# Patient Record
Sex: Male | Born: 1945
Health system: Southern US, Community
[De-identification: ages and names within clinical notes are randomized; demographics above are authoritative.]

## PROBLEM LIST (undated history)

## (undated) DIAGNOSIS — J302 Other seasonal allergic rhinitis: Secondary | ICD-10-CM

## (undated) DIAGNOSIS — D126 Benign neoplasm of colon, unspecified: Secondary | ICD-10-CM

## (undated) DIAGNOSIS — Z973 Presence of spectacles and contact lenses: Secondary | ICD-10-CM

## (undated) DIAGNOSIS — K648 Other hemorrhoids: Secondary | ICD-10-CM

## (undated) DIAGNOSIS — Z9889 Other specified postprocedural states: Secondary | ICD-10-CM

## (undated) DIAGNOSIS — Z860101 Personal history of adenomatous and serrated colon polyps: Secondary | ICD-10-CM

## (undated) DIAGNOSIS — Z8719 Personal history of other diseases of the digestive system: Secondary | ICD-10-CM

## (undated) DIAGNOSIS — IMO0002 Reserved for concepts with insufficient information to code with codable children: Secondary | ICD-10-CM

## (undated) DIAGNOSIS — M1990 Unspecified osteoarthritis, unspecified site: Secondary | ICD-10-CM

## (undated) DIAGNOSIS — E785 Hyperlipidemia, unspecified: Secondary | ICD-10-CM

## (undated) DIAGNOSIS — C61 Malignant neoplasm of prostate: Secondary | ICD-10-CM

## (undated) DIAGNOSIS — T7840XA Allergy, unspecified, initial encounter: Secondary | ICD-10-CM

## (undated) DIAGNOSIS — R112 Nausea with vomiting, unspecified: Secondary | ICD-10-CM

## (undated) DIAGNOSIS — K5790 Diverticulosis of intestine, part unspecified, without perforation or abscess without bleeding: Secondary | ICD-10-CM

## (undated) DIAGNOSIS — Z800 Family history of malignant neoplasm of digestive organs: Secondary | ICD-10-CM

## (undated) DIAGNOSIS — Z86010 Personal history of colonic polyps: Secondary | ICD-10-CM

## (undated) DIAGNOSIS — F419 Anxiety disorder, unspecified: Secondary | ICD-10-CM

## (undated) DIAGNOSIS — K589 Irritable bowel syndrome without diarrhea: Secondary | ICD-10-CM

## (undated) DIAGNOSIS — H269 Unspecified cataract: Secondary | ICD-10-CM

## (undated) DIAGNOSIS — K902 Blind loop syndrome, not elsewhere classified: Secondary | ICD-10-CM

## (undated) HISTORY — PX: CATARACT EXTRACTION: SUR2

## (undated) HISTORY — DX: Diverticulosis of intestine, part unspecified, without perforation or abscess without bleeding: K57.90

## (undated) HISTORY — DX: Family history of malignant neoplasm of digestive organs: Z80.0

## (undated) HISTORY — DX: Other hemorrhoids: K64.8

## (undated) HISTORY — PX: COLONOSCOPY: SHX174

## (undated) HISTORY — DX: Unspecified osteoarthritis, unspecified site: M19.90

## (undated) HISTORY — PX: ROTATOR CUFF REPAIR: SHX139

## (undated) HISTORY — DX: Allergy, unspecified, initial encounter: T78.40XA

## (undated) HISTORY — PX: ESOPHAGOGASTRODUODENOSCOPY (EGD) WITH PROPOFOL: SHX5813

## (undated) HISTORY — DX: Hyperlipidemia, unspecified: E78.5

## (undated) HISTORY — PX: MANDIBLE SURGERY: SHX707

## (undated) HISTORY — PX: TONSILLECTOMY: SUR1361

## (undated) HISTORY — DX: Benign neoplasm of colon, unspecified: D12.6

## (undated) HISTORY — PX: OTHER SURGICAL HISTORY: SHX169

## (undated) HISTORY — PX: INGUINAL HERNIA REPAIR: SUR1180

## (undated) HISTORY — PX: VASECTOMY: SHX75

## (undated) HISTORY — PX: UPPER GASTROINTESTINAL ENDOSCOPY: SHX188

## (undated) HISTORY — DX: Irritable bowel syndrome, unspecified: K58.9

## (undated) HISTORY — DX: Unspecified cataract: H26.9

## (undated) HISTORY — DX: Reserved for concepts with insufficient information to code with codable children: IMO0002

## (undated) HISTORY — DX: Blind loop syndrome, not elsewhere classified: K90.2

---

## 2004-05-02 ENCOUNTER — Ambulatory Visit (HOSPITAL_COMMUNITY): Admission: RE | Admit: 2004-05-02 | Discharge: 2004-05-02 | Payer: Self-pay | Admitting: Gastroenterology

## 2004-05-02 ENCOUNTER — Encounter: Payer: Self-pay | Admitting: Physician Assistant

## 2006-04-28 ENCOUNTER — Encounter: Payer: Self-pay | Admitting: Cardiovascular Disease

## 2008-05-31 ENCOUNTER — Ambulatory Visit: Payer: Self-pay | Admitting: Gastroenterology

## 2008-05-31 DIAGNOSIS — M549 Dorsalgia, unspecified: Secondary | ICD-10-CM | POA: Insufficient documentation

## 2008-05-31 DIAGNOSIS — Z87898 Personal history of other specified conditions: Secondary | ICD-10-CM

## 2008-05-31 DIAGNOSIS — K5900 Constipation, unspecified: Secondary | ICD-10-CM | POA: Insufficient documentation

## 2008-05-31 LAB — CONVERTED CEMR LAB
Amylase: 79 units/L (ref 27–131)
Ferritin: 59.7 ng/mL (ref 22.0–322.0)
Folate: 18.8 ng/mL
Iron: 58 ug/dL (ref 42–165)
Lipase: 26 units/L (ref 11.0–59.0)
Saturation Ratios: 17.5 % — ABNORMAL LOW (ref 20.0–50.0)
Sed Rate: 11 mm/hr (ref 0–16)
Tissue Transglutaminase Ab, IgA: 0 units (ref <7)
Transferrin: 236.1 mg/dL (ref >=212.0)
Vitamin B-12: 513 pg/mL (ref 211–911)

## 2008-06-08 ENCOUNTER — Telehealth: Payer: Self-pay | Admitting: Gastroenterology

## 2008-06-12 ENCOUNTER — Ambulatory Visit: Payer: Self-pay | Admitting: Gastroenterology

## 2008-06-12 DIAGNOSIS — K3189 Other diseases of stomach and duodenum: Secondary | ICD-10-CM

## 2008-06-12 DIAGNOSIS — R1013 Epigastric pain: Secondary | ICD-10-CM

## 2008-06-13 ENCOUNTER — Encounter: Payer: Self-pay | Admitting: Gastroenterology

## 2008-06-13 ENCOUNTER — Ambulatory Visit: Payer: Self-pay | Admitting: Gastroenterology

## 2008-06-15 ENCOUNTER — Encounter: Payer: Self-pay | Admitting: Gastroenterology

## 2008-06-19 ENCOUNTER — Telehealth: Payer: Self-pay | Admitting: Gastroenterology

## 2008-06-26 ENCOUNTER — Ambulatory Visit (HOSPITAL_COMMUNITY): Admission: RE | Admit: 2008-06-26 | Discharge: 2008-06-26 | Payer: Self-pay | Admitting: Gastroenterology

## 2008-07-11 DIAGNOSIS — K2990 Gastroduodenitis, unspecified, without bleeding: Secondary | ICD-10-CM

## 2008-07-11 DIAGNOSIS — N281 Cyst of kidney, acquired: Secondary | ICD-10-CM | POA: Insufficient documentation

## 2008-07-11 DIAGNOSIS — K2970 Gastritis, unspecified, without bleeding: Secondary | ICD-10-CM | POA: Insufficient documentation

## 2008-07-11 DIAGNOSIS — K2980 Duodenitis without bleeding: Secondary | ICD-10-CM | POA: Insufficient documentation

## 2008-07-17 ENCOUNTER — Ambulatory Visit: Payer: Self-pay | Admitting: Gastroenterology

## 2008-07-17 DIAGNOSIS — K4090 Unilateral inguinal hernia, without obstruction or gangrene, not specified as recurrent: Secondary | ICD-10-CM | POA: Insufficient documentation

## 2008-07-17 DIAGNOSIS — K589 Irritable bowel syndrome without diarrhea: Secondary | ICD-10-CM

## 2008-07-17 DIAGNOSIS — D509 Iron deficiency anemia, unspecified: Secondary | ICD-10-CM

## 2008-07-19 ENCOUNTER — Telehealth: Payer: Self-pay | Admitting: Gastroenterology

## 2008-07-27 ENCOUNTER — Ambulatory Visit: Payer: Self-pay | Admitting: Gastroenterology

## 2008-07-27 LAB — CONVERTED CEMR LAB: Fecal Occult Bld: NEGATIVE

## 2008-08-09 ENCOUNTER — Encounter: Payer: Self-pay | Admitting: Gastroenterology

## 2008-09-28 ENCOUNTER — Ambulatory Visit (HOSPITAL_BASED_OUTPATIENT_CLINIC_OR_DEPARTMENT_OTHER): Admission: RE | Admit: 2008-09-28 | Discharge: 2008-09-28 | Payer: Self-pay | Admitting: Surgery

## 2009-03-26 ENCOUNTER — Ambulatory Visit (HOSPITAL_COMMUNITY): Admission: RE | Admit: 2009-03-26 | Discharge: 2009-03-27 | Payer: Self-pay | Admitting: Specialist

## 2009-06-10 ENCOUNTER — Encounter: Payer: Self-pay | Admitting: Physician Assistant

## 2009-07-08 ENCOUNTER — Encounter: Payer: Self-pay | Admitting: Physician Assistant

## 2009-08-20 ENCOUNTER — Encounter: Payer: Self-pay | Admitting: Physician Assistant

## 2009-09-08 ENCOUNTER — Encounter: Payer: Self-pay | Admitting: Physician Assistant

## 2009-09-10 ENCOUNTER — Encounter: Payer: Self-pay | Admitting: Physician Assistant

## 2009-09-23 ENCOUNTER — Encounter: Payer: Self-pay | Admitting: Physician Assistant

## 2010-02-10 ENCOUNTER — Ambulatory Visit: Payer: Self-pay | Admitting: Gastroenterology

## 2010-02-10 ENCOUNTER — Ambulatory Visit: Payer: Self-pay | Admitting: Cardiovascular Disease

## 2010-02-10 ENCOUNTER — Telehealth: Payer: Self-pay | Admitting: Gastroenterology

## 2010-02-10 DIAGNOSIS — R110 Nausea: Secondary | ICD-10-CM | POA: Insufficient documentation

## 2010-02-10 DIAGNOSIS — K5730 Diverticulosis of large intestine without perforation or abscess without bleeding: Secondary | ICD-10-CM | POA: Insufficient documentation

## 2010-02-10 DIAGNOSIS — R1013 Epigastric pain: Secondary | ICD-10-CM

## 2010-02-10 DIAGNOSIS — R1011 Right upper quadrant pain: Secondary | ICD-10-CM

## 2010-02-10 DIAGNOSIS — K219 Gastro-esophageal reflux disease without esophagitis: Secondary | ICD-10-CM | POA: Insufficient documentation

## 2010-02-10 LAB — CONVERTED CEMR LAB
ALT: 50 units/L (ref 0–53)
AST: 35 units/L (ref 0–37)
Albumin: 4.2 g/dL (ref 3.5–5.2)
Alkaline Phosphatase: 68 units/L (ref 39–117)
BUN: 7 mg/dL (ref 6–23)
Basophils Absolute: 0 10*3/uL (ref 0.0–0.1)
Basophils Relative: 0.5 % (ref 0.0–3.0)
Bilirubin Urine: NEGATIVE
CO2: 31 meq/L (ref 19–32)
CRP, High Sensitivity: 72.13 — ABNORMAL HIGH (ref 0.00–5.00)
Calcium: 9 mg/dL (ref 8.4–10.5)
Chloride: 99 meq/L (ref 96–112)
Creatinine, Ser: 0.9 mg/dL (ref 0.4–1.5)
Eosinophils Absolute: 0 10*3/uL (ref 0.0–0.7)
Eosinophils Relative: 0.4 % (ref 0.0–5.0)
GFR calc non Af Amer: 91.33 mL/min (ref >60)
Glucose, Bld: 105 mg/dL — ABNORMAL HIGH (ref 70–99)
HCT: 43.1 % (ref 39.0–52.0)
Hemoglobin: 15 g/dL (ref 13.0–17.0)
Ketones, ur: NEGATIVE mg/dL
Leukocytes, UA: NEGATIVE
Lipase: 34 units/L (ref 11.0–59.0)
Lymphocytes Relative: 10.3 % — ABNORMAL LOW (ref 12.0–46.0)
Lymphs Abs: 0.6 10*3/uL — ABNORMAL LOW (ref 0.7–4.0)
MCHC: 34.7 g/dL (ref 30.0–36.0)
MCV: 91.2 fL (ref 78.0–100.0)
Monocytes Absolute: 0.5 10*3/uL (ref 0.1–1.0)
Monocytes Relative: 9.1 % (ref 3.0–12.0)
Neutro Abs: 4.4 10*3/uL (ref 1.4–7.7)
Neutrophils Relative %: 79.7 % — ABNORMAL HIGH (ref 43.0–77.0)
Nitrite: NEGATIVE
Platelets: 180 10*3/uL (ref 150.0–400.0)
Potassium: 5.6 meq/L — ABNORMAL HIGH (ref 3.5–5.1)
RBC: 4.72 M/uL (ref 4.22–5.81)
RDW: 13.5 % (ref 11.5–14.6)
Sodium: 135 meq/L (ref 135–145)
Specific Gravity, Urine: 1.01 (ref 1.000–1.030)
Total Bilirubin: 0.8 mg/dL (ref 0.3–1.2)
Total Protein, Urine: NEGATIVE mg/dL
Total Protein: 6.8 g/dL (ref 6.0–8.3)
Urine Glucose: NEGATIVE mg/dL
Urobilinogen, UA: 0.2 (ref 0.0–1.0)
WBC: 5.6 10*3/uL (ref 4.5–10.5)
pH: 6.5 (ref 5.0–8.0)

## 2010-02-11 ENCOUNTER — Ambulatory Visit (HOSPITAL_COMMUNITY): Admission: RE | Admit: 2010-02-11 | Discharge: 2010-02-11 | Payer: Self-pay | Admitting: Gastroenterology

## 2010-02-11 DIAGNOSIS — K8020 Calculus of gallbladder without cholecystitis without obstruction: Secondary | ICD-10-CM | POA: Insufficient documentation

## 2010-02-12 ENCOUNTER — Telehealth: Payer: Self-pay | Admitting: Physician Assistant

## 2010-02-13 ENCOUNTER — Ambulatory Visit: Payer: Self-pay | Admitting: Physician Assistant

## 2010-02-13 ENCOUNTER — Ambulatory Visit: Payer: Self-pay | Admitting: Internal Medicine

## 2010-02-13 DIAGNOSIS — R143 Flatulence: Secondary | ICD-10-CM

## 2010-02-13 DIAGNOSIS — R141 Gas pain: Secondary | ICD-10-CM

## 2010-02-13 DIAGNOSIS — R142 Eructation: Secondary | ICD-10-CM

## 2010-02-14 ENCOUNTER — Telehealth: Payer: Self-pay | Admitting: Physician Assistant

## 2010-02-14 LAB — CONVERTED CEMR LAB
ALT: 34 units/L (ref 0–53)
AST: 31 units/L (ref 0–37)
Albumin: 4.3 g/dL (ref 3.5–5.2)
Alkaline Phosphatase: 60 units/L (ref 39–117)
BUN: 11 mg/dL (ref 6–23)
Basophils Absolute: 0 10*3/uL (ref 0.0–0.1)
Basophils Relative: 0.5 % (ref 0.0–3.0)
CO2: 29 meq/L (ref 19–32)
CRP, High Sensitivity: 32.93 — ABNORMAL HIGH (ref 0.00–5.00)
Calcium: 9.4 mg/dL (ref 8.4–10.5)
Chloride: 100 meq/L (ref 96–112)
Creatinine, Ser: 1 mg/dL (ref 0.4–1.5)
Eosinophils Absolute: 0.2 10*3/uL (ref 0.0–0.7)
Eosinophils Relative: 4.3 % (ref 0.0–5.0)
GFR calc non Af Amer: 78.92 mL/min (ref >60)
Glucose, Bld: 101 mg/dL — ABNORMAL HIGH (ref 70–99)
HCT: 41.5 % (ref 39.0–52.0)
Hemoglobin: 14.4 g/dL (ref 13.0–17.0)
Lymphocytes Relative: 24.8 % (ref 12.0–46.0)
Lymphs Abs: 1 10*3/uL (ref 0.7–4.0)
MCHC: 34.6 g/dL (ref 30.0–36.0)
MCV: 91.3 fL (ref 78.0–100.0)
Monocytes Absolute: 0.5 10*3/uL (ref 0.1–1.0)
Monocytes Relative: 12.1 % — ABNORMAL HIGH (ref 3.0–12.0)
Neutro Abs: 2.5 10*3/uL (ref 1.4–7.7)
Neutrophils Relative %: 58.3 % (ref 43.0–77.0)
Platelets: 199 10*3/uL (ref 150.0–400.0)
Potassium: 4.7 meq/L (ref 3.5–5.1)
RBC: 4.55 M/uL (ref 4.22–5.81)
RDW: 13.3 % (ref 11.5–14.6)
Sodium: 138 meq/L (ref 135–145)
Total Bilirubin: 0.6 mg/dL (ref 0.3–1.2)
Total Protein: 7.2 g/dL (ref 6.0–8.3)
WBC: 4.2 10*3/uL — ABNORMAL LOW (ref 4.5–10.5)

## 2010-02-20 ENCOUNTER — Ambulatory Visit (HOSPITAL_COMMUNITY): Admission: RE | Admit: 2010-02-20 | Discharge: 2010-02-20 | Payer: Self-pay | Admitting: Internal Medicine

## 2010-02-24 ENCOUNTER — Telehealth: Payer: Self-pay | Admitting: Physician Assistant

## 2010-02-28 ENCOUNTER — Encounter: Payer: Self-pay | Admitting: Physician Assistant

## 2010-04-17 ENCOUNTER — Encounter: Payer: Self-pay | Admitting: Gastroenterology

## 2010-05-18 HISTORY — PX: LAPAROSCOPIC CHOLECYSTECTOMY: SUR755

## 2010-05-28 ENCOUNTER — Telehealth: Payer: Self-pay | Admitting: Gastroenterology

## 2010-06-04 ENCOUNTER — Encounter (INDEPENDENT_AMBULATORY_CARE_PROVIDER_SITE_OTHER): Payer: Self-pay | Admitting: *Deleted

## 2010-06-09 ENCOUNTER — Ambulatory Visit
Admission: RE | Admit: 2010-06-09 | Discharge: 2010-06-09 | Payer: Self-pay | Source: Home / Self Care | Attending: Gastroenterology | Admitting: Gastroenterology

## 2010-06-16 ENCOUNTER — Ambulatory Visit
Admission: RE | Admit: 2010-06-16 | Discharge: 2010-06-16 | Payer: Self-pay | Source: Home / Self Care | Attending: Gastroenterology | Admitting: Gastroenterology

## 2010-06-16 ENCOUNTER — Other Ambulatory Visit: Payer: Self-pay | Admitting: Gastroenterology

## 2010-06-16 DIAGNOSIS — D1330 Benign neoplasm of unspecified part of small intestine: Secondary | ICD-10-CM

## 2010-06-17 NOTE — Assessment & Plan Note (Signed)
Summary: RUQ pain, nasuea and fever/sheri   History of Present Illness Visit Type: Follow-up Visit Primary GI MD: Sheryn Bison MD FACP FAGA Primary Provider: Darrow Bussing, MD Chief Complaint: Patient complains of adbominal pain that has happened twice in the past week. He went to [place] [facility] and they gave him a liquid to drank and he felt better. The second time he ran a fever and was tender to the touch on the right side. Today he is still having some tenderness on the right side and some bloating.  History of Present Illness:   PLEASANT 65 Y.O MALE KNOWN TO DR. PATTERSON WITH HX OF GERD,BARRETTS,DIVERTICULOSIS. LAST COLONOSCOPY WAS DONE IN 2005 PER DR. Lollie Marrow WILL OBTAIN A COPY. HE COMES IN TODAY AS AN ACUTE ADD ON WITH C/O UPPER ABDOMINAL PAIN, BLOATING,GAS,NAUSEA AND FEVER ONSET 9/24 AT ABOUT MIDNIGHT. HE SAYS HE WOKE UP WITH DISCOMFORT ALL ACROSS THE UPPER ABDOMEN,RADIATING INTO HIS BACK AND DESCRIBED AS A PUSHING FEELING ON HIS RIBS. HE DEVELOPED CHILLS,THEN FEVER TO 103 DOCUMENTED BY HIS WIFE. HE CONTINUED TO HAVE FEVER YESTERDAY IN THE 100 RANGE,AND 100.4 AT HOME THIS AM. NO DIARREA,APPETITE DECREASED. APPARENTLY WAS VISIBLY BLOATED YESTERDAY, VERY GASSY. NO DYSURIA, NO COUCH ,SPUTUM ETC. EATING VERY LITTLE AS AFRAID IT WILL MAKE HIM FEEL WORSE.  HE HAD A SIMILAR EPISODE  2 WEEKS AGO. HE WAS SEEN AT [facility],DID NOT TAKE HIS TEMP THAT TIME. NO LABS OR XRAYS-WAS TREATED WITH WHAT SOUNDS LIKE A GI COCKTAIL FOR 5-6 DAYS AND SXS RESOLVED.  HE HAD AN [place]  2/10 WHICH WAS NORMAL.ONLY PRIOR SURGERY WAS A LEFT INGUINAL HERNIA REPAIR EARLIER THIS YEAR.   GI Review of Systems    Reports bloating.     Location of  Abdominal pain: upper abdomen.    Denies abdominal pain, acid reflux, belching, chest pain, dysphagia with liquids, dysphagia with solids, heartburn, loss of appetite, nausea, vomiting, vomiting blood, weight loss, and  weight gain.        Denies anal fissure, black  tarry stools, change in bowel habit, constipation, diarrhea, diverticulosis, fecal incontinence, heme positive stool, hemorrhoids, irritable bowel syndrome, jaundice, light color stool, liver problems, rectal bleeding, and  rectal pain.    Current Medications (verified): 1)  Multivitamins  Tabs (Multiple Vitamin) .Marland Kitchen.. 1 Tablet By Mouth Once Daily 2)  Aciphex 20 Mg  Tbec (Rabeprazole Sodium) .... Take 1 Each Day 30 Minutes Before Meals 3)  Fish Oil 1000 Mg Caps (Omega-3 Fatty Acids) .... Once Daily 4)  Flomax 0.4 Mg Xr24h-Cap (Tamsulosin Hcl) .... At Bedtime 5)  Glucosamine 1500 Complex  Caps (Glucosamine-Chondroit-Vit C-Mn) .... Once Daily 6)  Align   Caps (Misc Intestinal Flora Regulat) .... Take One Capsule By Mouth Daily As Needed 7)  Tylenol 325 Mg Tabs (Acetaminophen) .... Take One By Mouth As Needed  Allergies (verified): No Known Drug Allergies  Past History:  Past Medical History: Current Problems:  BARRETTS ESOPHAGUS (ICD-530.85) GASTRITIS (ICD-535.50)  RENAL CYST, RIGHT (ICD-593.2)  BENIGN PROSTATIC HYPERTROPHY, MILD, HX OF (ICD-V13.8) BACK PAIN, CHRONIC (ICD-724.5) IBS BLIND LOOP SYNDROME (ICD-579.2) INGUINAL HERNIA (ICD-550.90)  Past Surgical History: Reviewed history from 05/31/2008 and no changes required. Hernia repair (inguinal) Vasectomy Jaw surgery  Family History: Reviewed history from 05/31/2008 and no changes required. Family History of Colon Cancer: mother Family History of Diabetes: mother  m.grandmother  Social History: Reviewed history from 05/31/2008 and no changes required. Patient has never smoked.  Alcohol Use - yes Daily Caffeine Use Occupation:  retired  Review of Systems  The patient denies allergy/sinus, anemia, anxiety-new, arthritis/joint pain, back pain, blood in urine, breast changes/lumps, change in vision, confusion, cough, coughing up blood, depression-new, fainting, fatigue, fever, headaches-new, hearing problems, heart murmur,  heart rhythm changes, itching, menstrual pain, muscle pains/cramps, night sweats, nosebleeds, pregnancy symptoms, shortness of breath, skin rash, sleeping problems, sore throat, swelling of feet/legs, swollen lymph glands, thirst - excessive , urination - excessive , urination changes/pain, urine leakage, vision changes, and voice change.         SEE HPI  Vital Signs:  Patient profile:   65 year old male Height:      72 inches Weight:      171.6 pounds BMI:     23.36 Temp:     100.8 degrees F oral Pulse rate:   104 / minute Pulse rhythm:   regular BP sitting:   94 / 70  (left arm) Cuff size:   regular  Vitals Entered By: Harlow Mares CMA Duncan Dull) (February 10, 2010 1:38 PM)  Physical Exam  General:  Well developed, well nourished, no acute distress. Head:  Normocephalic and atraumatic. Eyes:  PERRLA, no icterus. Neck:  Supple; no masses or thyromegaly. Lungs:  Clear throughout to auscultation. Heart:  Regular rate and rhythm; no murmurs, rubs,  or bruits. Abdomen:  SOFT, MILDLY TENDER EPIGASTRIUM AND RUQ, NO GUARDING, NO REBOUND, NO MASS OR HSM,BS+ Rectal:  NOT DONE Extremities:  No clubbing, cyanosis, edema or deformities noted. Neurologic:  Alert and  oriented x4;  grossly normal neurologically. Skin:  Intact without significant lesions or rashes. Psych:  Alert and cooperative. Normal mood and affect.   Impression & Recommendations:  Problem # 1:  ABDOMINAL PAIN, EPIGASTRIC (ICD-789.06) 64 YO MALE WITH 2 DAY HX OF UPPER ABDOMINAL DISCOMFORT WITH BLOATING ,GAS, NAUSEA, AND  FEVER TO 103. ETIOLOGY IS NOT CLEAR. NEGATIVE [place] 2010.   RECURRENT -WITH SIMILAR EPISODE 2 WEEKS AGO  OBTAIN LABS NOW SCHEDULE FOR CT ABD/PELVIS  TODAY  EMPIRIC CIPRO 500 MG TWICE DAILY X 7 DAYS PENDING RESULTS OF LABS AND CT. IF CT UNREVEALING MAY NEED HIDA SCAN. BLAND DIET  Problem # 2:  DIVERTICULOSIS-COLON (ICD-562.10) Assessment: Comment Only WILL OBTAIN A COPY OF COLONOSCOPY Sherin Quarry  2005  Problem # 3:  GERD (ICD-530.81) Assessment: Comment Only STABLE ON ACIPHEX 20 MG DAILY  Problem # 4:  BARRETTS ESOPHAGUS (ICD-530.85) Assessment: Comment Only  Other Orders: TLB-CMP (Comprehensive Metabolic Pnl) (80053-COMP) TLB-CRP-High Sensitivity (C-Reactive Protein) (86140-FCRP) TLB-CBC Platelet - w/Differential (85025-CBCD) TLB-Lipase (83690-LIPASE) TLB-Udip w/ Micro (81001-URINE) CT Abdomen/Pelvis with Contrast (CT Abd/Pelvis w/con) HIDA CCK (HIDA CCK)  Patient Instructions: 1)  Please go to lab, basement level. 2)  We scheduled the Ct Scan at [facility] [address] across from [facility].  [facility]. 3)  [facility]  4)  Copy sent to : Dr Carilyn Goodpasture Koirala 5)  The medication list was reviewed and reconciled.  All changed / newly prescribed medications were explained.  A complete medication list was provided to the patient / caregiver. Prescriptions: CIPRO 500 MG TABS (CIPROFLOXACIN HCL) Take 1 tab twice daily x 7 days  #14 x 0   Entered by:   Lowry Ram NCMA   Authorized by:   Sammuel Cooper PA-c   Signed by:   Lowry Ram NCMA on 02/10/2010   Method used:   [facility]  [place] [address]  [facility] [address] (retail)       [address]       [place], [place]  [address]       Ph: [phone] or [phone]       Fax: [phone]   RxID:   [id number]

## 2010-06-17 NOTE — Letter (Signed)
Summary: Darrow Bussing MD  Dibas Koirala MD   Imported By: Sherian Rein 03/21/2010 14:44:08  _____________________________________________________________________  External Attachment:    Type:   Image     Comment:   External Document

## 2010-06-17 NOTE — Assessment & Plan Note (Signed)
Summary: F/U CT and, Hida Scan results   History of Present Illness Visit Type: Follow-up Visit Primary GI MD: Sheryn Bison MD FACP FAGA Primary Provider: Darrow Bussing, MD Chief Complaint: Review CT and Hida Scan  Pt. denies fever and had some slight pain in ribcage yesterday but denies any pain today  Pt. does c/o feeling slightly light headed this week. History of Present Illness:   Manuel Landry 65 YO MALE KNOWN TO DR. PATTERSON WHO WAS SEEN ON 9/26 WITH AN ACUTE  FEBRILE ILLNESS ASSOCIATED WITH UPPER ABDOMINAL DISCOMFORT,NAUSEA AND BLOATING. HE HAD TEMP AS HIGH AS 103 DOCUMENTED. HE HAS SINCE HAD CT ABD/PELVIS SHOWING 2 SMALL GALLSTONES, NO DUCTAL DILATION, A DUODENAL DIVERTICULUM,AND NO ACUTE INFLAMMATORY CHANGES. FOLLOW UP  HIDA  WAS NEGATIVE WITH GOOD FILLLING OF THE GB AFTER MORPHINE. LABS ALL UNREMARKABLE EXCEPT FOR A LEFT SHIFT, AND A CRP OF 73. HE HAS BEEN ON CIPRO two times a day SINCE 9/26. COMES IN FOR FOLLOW UP. HE FEELS SIGNIFICANTLY BETTER. NO NAUSEA, NO PAIN, BUT STILL BLOATED FEELING. HE IS ABLE TO EAT . HE HAS BEEN AFEBRILE SINC E MORNING OF 9/28. HE FEELS CONSTIPATED , NO BM SINCE 9/25.  HE AND HIS WIFE HAVE ALOT OF QUESTIONS ABOUT HIS GALLBLADDER.   GI Review of Systems    Reports bloating.      Denies abdominal pain, acid reflux, belching, chest pain, dysphagia with liquids, dysphagia with solids, heartburn, loss of appetite, nausea, vomiting, vomiting blood, weight loss, and  weight gain.        Denies anal fissure, black tarry stools, change in bowel habit, constipation, diarrhea, diverticulosis, fecal incontinence, heme positive stool, hemorrhoids, irritable bowel syndrome, jaundice, light color stool, liver problems, rectal bleeding, and  rectal pain.    Current Medications (verified): 1)  Multivitamins  Tabs (Multiple Vitamin) .Marland Kitchen.. 1 Tablet By Mouth Once Daily 2)  Aciphex 20 Mg  Tbec (Rabeprazole Sodium) .... Take 1 Each Day 30 Minutes Before Meals 3)  Fish Oil 1000  Mg Caps (Omega-3 Fatty Acids) .... Once Daily 4)  Flomax 0.4 Mg Xr24h-Cap (Tamsulosin Hcl) .... At Bedtime 5)  Glucosamine 1500 Complex  Caps (Glucosamine-Chondroit-Vit C-Mn) .... Once Daily 6)  Align   Caps (Misc Intestinal Flora Regulat) .... Take One Capsule By Mouth Daily As Needed 7)  Tylenol 325 Mg Tabs (Acetaminophen) .... Take One By Mouth As Needed 8)  Cipro 500 Mg Tabs (Ciprofloxacin Hcl) .... Take 1 Tab Twice Daily X 7 Days  Allergies (verified): No Known Drug Allergies  Past History:  Past Medical History: Reviewed history from 02/10/2010 and no changes required. Current Problems:  BARRETTS ESOPHAGUS (ICD-530.85) GASTRITIS (ICD-535.50)  RENAL CYST, RIGHT (ICD-593.2)  BENIGN PROSTATIC HYPERTROPHY, MILD, HX OF (ICD-V13.8) BACK PAIN, CHRONIC (ICD-724.5) IBS BLIND LOOP SYNDROME (ICD-579.2) INGUINAL HERNIA (ICD-550.90)  Past Surgical History: Reviewed history from 05/31/2008 and no changes required. Hernia repair (inguinal) Vasectomy Jaw surgery  Family History: Reviewed history from 05/31/2008 and no changes required. Family History of Colon Cancer: mother Family History of Diabetes: mother  m.grandmother  Social History: Reviewed history from 05/31/2008 and no changes required. Patient has never smoked.  Alcohol Use - yes Daily Caffeine Use Occupation: retired  Review of Systems       The patient complains of arthritis/joint pain.  The patient denies allergy/sinus, anemia, anxiety-new, back pain, blood in urine, breast changes/lumps, change in vision, confusion, cough, coughing up blood, depression-new, fainting, fatigue, fever, headaches-new, hearing problems, heart murmur, heart rhythm changes, itching, muscle pains/cramps,  night sweats, nosebleeds, shortness of breath, skin rash, sleeping problems, sore throat, swelling of feet/legs, swollen lymph glands, thirst - excessive, urination - excessive, urination changes/pain, urine leakage, vision changes, and  voice change.         SEE HPI  Vital Signs:  Patient profile:   65 year old male Height:      72 inches Weight:      170.50 pounds BMI:     23.21 Temp:     98.2 degrees F oral Pulse rate:   84 / minute Pulse rhythm:   regular BP sitting:   92 / 64  (left arm)  Vitals Entered By: Milford Cage NCMA (February 13, 2010 1:40 PM)  Physical Exam  General:  Well developed, well nourished, no acute distress. Head:  Normocephalic and atraumatic. Eyes:  PERRLA, no icterus. Lungs:  Clear throughout to auscultation. Heart:  Regular rate and rhythm; no murmurs, rubs,  or bruits. Abdomen:  SOFT, NONTENDER, NON DISTENDED,BS+, NO MASS OR HSM  Rectal:  NOT DONE Neurologic:  Alert and  oriented x4;  grossly normal neurologically.   Impression & Recommendations:  Problem # 1:  FEBRILE ILLNESS Assessment Improved 65 YO MALE WITH FEBRILE ILLNESS-SEE NOTE OF 9/26, WORKUP  POSITIVE FOR CHOLELITHIASIS BUT NO EVIDIENCE  OF CHOLECYSTITIS. EXACT CAUSE OF ILLNESS REMAINS UNCLEAR,? GB RELATED,?VIRAL SYNDROME-, HE HAS A DUODENAL DIVERTICUM ?ITIS BUT AGAIN NO INFLAMMATION ON CT.    HE IS FEELING MUCH BETTER.  HE WILL COMPLETE THE COURSE OF CIPRO two times a day X 7 DAYS REPEAT LABS TODAY DISCUSSED WITH DR. PATTERSON YESTERDAY AND WILL PURSUE CCK-HIDA SCAN NEXT WEEK, AND SURGICAL OPINION.   Problem # 2:  CHOLELITHIASIS (ICD-574.2) Assessment: Comment Only  Problem # 3:  DIVERTICULOSIS-COLON (ICD-562.10) Assessment: Comment Only  Problem # 4:  BARRETTS ESOPHAGUS (ICD-530.85) Assessment: Comment Only  Other Orders: [facility] ([facility]) [facility] ([facility]) HIDA CCK (HIDA CCK)  Patient Instructions: 1)  We made you an appointment at [facility] for 02-28-10 with Dr Johna Sheriff.  2)  We will call you when we schedule the Hida Scan .  3)  Copy sent to :  Dibas Koirala, MD 4)  The medication list was reviewed and reconciled.  All changed / newly  prescribed medications were explained.  A complete medication list was provided to the patient / caregiver.

## 2010-06-17 NOTE — Letter (Signed)
Summary: Darrow Bussing MD  Dibas Koirala MD   Imported By: Sherian Rein 03/21/2010 14:46:13  _____________________________________________________________________  External Attachment:    Type:   Image     Comment:   External Document

## 2010-06-17 NOTE — Progress Notes (Signed)
Summary: progress report  Phone Note Outgoing Call   Call placed by: Joselyn Glassman,  February 12, 2010 9:03 AM Call placed to: Patient Summary of Call: Phoned pt per [name] PA and asked the pt about his pain level, his fever, able to eat?  The pt said his fever was 100.5 yesterday,  He said his fever broke this AM and he is afebrile at this time today.  He did eat some grilled chicken and some cookies yesterday.  He does have an appetite.  His pain level on Sun and Mon was about an 11 on the pain scale of 1-10.  Today he says he still has RUQ pain but it is much less, about a 2 on the pain scale.  Amy then got on the phone and told him after he and his wife on speaker phone asked about the gallstones.  Amy said we don't think that the stones are the cause of this illness he has right now.  Amy advised the pt to call us tomorrow morning with an updated on how he is doing.  Initial call taken by: Joselyn Glassman,  February 12, 2010 9:08 AM  Follow-up for Phone Call        pt. called in with an update: Temp is remaining normal but has alot of questions he would like to ask.  #[phone] Follow-up by: Karna Christmas,  February 13, 2010 8:36 AM  Additional Follow-up for Phone Call Additional follow up Details #1::        Called pt to inform him that Amy suggested he come in this afternoon.  I made him an appt for 2 PM today, 02-13-10. Pt to come for stat labs before the appt.  The order for them is in IDX. CBC, CMET, CRP.  Additional Follow-up by: Joselyn Glassman,  February 13, 2010 11:47 AM

## 2010-06-17 NOTE — Letter (Signed)
Summary: Darrow Bussing MD  Dibas Koirala MD   Imported By: Sherian Rein 03/21/2010 14:45:23  _____________________________________________________________________  External Attachment:    Type:   Image     Comment:   External Document

## 2010-06-17 NOTE — Progress Notes (Signed)
Summary: Gallbladder attack  Phone Note Call from Patient Call back at Home Phone [phone]   Call For: Dr Jarold Motto Summary of Call: Gallbladder attack. Wanted to schedule office visit but not next available on 03-11-10 Initial call taken by: Leanor Kail [facility],  February 10, 2010 9:26 AM  Follow-up for Phone Call        Patient with RUQ pain, fever 100.2 (t-max 104), nausea. Wife states his right side is tender to the touch.  He will come in and see [name] PA today at 1:30 Follow-up by: Darcey Nora RN, CGRN,  February 10, 2010 10:15 AM

## 2010-06-17 NOTE — Letter (Signed)
Summary: [facility]   Imported By: Sherian Rein 03/21/2010 14:47:21  _____________________________________________________________________  External Attachment:    Type:   Image     Comment:   External Document

## 2010-06-17 NOTE — Progress Notes (Signed)
Summary: Test Results  Phone Note Call from Patient Call back at Home Phone [phone]   Call For: [name], [place] Reason for Call: Lab or Test Results, Referral Summary of Call: [facility] test results Initial call taken by: Leanor Kail [facility],  February 24, 2010 8:35 AM  Follow-up for Phone Call        done Follow-up by: Peterson Ao,  February 27, 2010 5:00 PM

## 2010-06-17 NOTE — Consult Note (Signed)
Summary: [facility]   Imported By: Lester [place] 03/17/2010 08:32:24  _____________________________________________________________________  External Attachment:    Type:   Image     Comment:   External Document

## 2010-06-17 NOTE — Letter (Signed)
Summary: [facility]   Imported By: Sherian Rein 03/21/2010 14:42:03  _____________________________________________________________________  External Attachment:    Type:   Image     Comment:   External Document

## 2010-06-17 NOTE — Progress Notes (Signed)
  Phone Note Outgoing Call   Call placed by: Joselyn Glassman,  February 14, 2010 9:49 AM Call placed to: Patient Summary of Call: Called the pt to inform him of his second Hida Scan CCK appt we made for Thurs 02-20-10 at [facility].  I verified with him we made him his consult appt with Dr. Johna Sheriff for Thurs 02-28-10. The pt wanted me to let Amy know he has an appt with Dr. Retta Diones at [facility].  Initial call taken by: Joselyn Glassman,  February 14, 2010 9:54 AM

## 2010-06-17 NOTE — Procedures (Signed)
Summary: Marianna Payment MD  Marianna Payment MD   Imported By: Lester [place] 03/11/2010 10:18:05  _____________________________________________________________________  External Attachment:    Type:   Image     Comment:   External Document

## 2010-06-19 NOTE — Miscellaneous (Signed)
Summary: [facility]  Clinical Lists Changes  Medications: Added new medication of MOVIPREP 100 GM  SOLR (PEG-KCL-NACL-NASULF-NA ASC-C) As per prep instructions. - Signed Rx of MOVIPREP 100 GM  SOLR (PEG-KCL-NACL-NASULF-NA ASC-C) As per prep instructions.;  #1 x 0;  Signed;  Entered by: Ezra Sites RN;  Authorized by: Mardella Layman MD [facility];  Method used: [facility]  [place] [address] [place], [place], [place]  [address], Ph: [phone] or [phone], Fax: [phone] Observations: Added new observation of NKA: T (06/09/2010 7:56)    Prescriptions: MOVIPREP 100 GM  SOLR (PEG-KCL-NACL-NASULF-NA ASC-C) As per prep instructions.  #1 x 0   Entered by:   Ezra Sites RN   Authorized by:   Mardella Layman MD [facility]   Signed by:   Ezra Sites RN on 06/09/2010   Method used:   [facility]        [facility]  [place] [address] (retail)       [address]       [place], [place]  [address]       Ph: [phone] or [phone]       Fax: [phone]   RxID:   [id number]

## 2010-06-19 NOTE — Progress Notes (Signed)
Summary: Question for nurse  Phone Note Call from Patient Call back at Home Phone [phone]   Call For: Dr Jarold Motto Reason for Call: Talk to Nurse Summary of Call: Has a question for nurse regarding the procedure that he had done previously. Initial call taken by: Leanor Kail [facility],  May 28, 2010 2:00 PM  Follow-up for Phone Call        Dr Jarold Motto, patient with recent Cholecystectomy and doing well. Primary doc, Dr Docia Chuck, called to report he is due for 5 year Colonoscopy; last one 05/02/2004 with Dr Sherin Quarry. Mother w/ hx of colon cancer; patient has polyp removed in 1999. Patient would like to remain in our practice- do we schedule Direct Colon or OV? Graciella Freer RN  May 28, 2010 2:48 PM   Additional Follow-up for Phone Call Additional follow up Details #1::        DIRECT IS OK IF PRE VISIT NURSE PASSES HIM.Marland KitchenMarland Kitchen Additional Follow-up by: Mardella Layman MD FACG,  May 28, 2010 2:51 PM    Additional Follow-up for Phone Call Additional follow up Details #2::    Notified patient of his appointments: pre visit 06/09/10 and direct colon 06/16/10. Patient stated understanding. Follow-up by: Graciella Freer RN,  May 28, 2010 3:12 PM

## 2010-06-19 NOTE — Letter (Signed)
Summary: [facility]   Imported By: Lennie Odor 04/30/2010 16:11:32  _____________________________________________________________________  External Attachment:    Type:   Image     Comment:   External Document

## 2010-06-19 NOTE — Letter (Signed)
Summary: Moviprep Instructions  [facility]  [address]   [place], [place] [address]   Phone: [phone]  Fax: [phone]       Manuel Landry    May 31, 1945    MRN: [id number]        Procedure Day /Date: Monday, 06-16-10     Arrival Time: 7:30 a.m.     Procedure Time: 8:30 a.m.     Location of Procedure:                    x  [facility] (4th Floor)                        PREPARATION FOR COLONOSCOPY WITH MOVIPREP   Starting 5 days prior to your procedure 06-11-10 do not eat nuts, seeds, popcorn, corn, beans, peas,  salads, or any raw vegetables.  Do not take any fiber supplements (e.g. Metamucil, Citrucel, and Benefiber).  THE DAY BEFORE YOUR PROCEDURE         DATE: 06-15-10 DAY: Sumday  1.  Drink clear liquids the entire day-NO SOLID FOOD  2.  Do not drink anything colored red or purple.  Avoid juices with pulp.  No orange juice.  3.  Drink at least 64 oz. (8 glasses) of fluid/clear liquids during the day to prevent dehydration and help the prep work efficiently.  CLEAR LIQUIDS INCLUDE: Water Jello Ice Popsicles Tea (sugar ok, no milk/cream) Powdered fruit flavored drinks Coffee (sugar ok, no milk/cream) Gatorade Juice: apple, white grape, white cranberry  Lemonade Clear bullion, consomm, broth Carbonated beverages (any kind) Strained chicken noodle soup Hard Candy                             4.  In the morning, mix first dose of MoviPrep solution:    Empty 1 Pouch A and 1 Pouch B into the disposable container    Add lukewarm drinking water to the top line of the container. Mix to dissolve    Refrigerate (mixed solution should be used within 24 hrs)  5.  Begin drinking the prep at 5:00 p.m. The MoviPrep container is divided by 4 marks.   Every 15 minutes drink the solution down to the next mark (approximately 8 oz) until the full liter is complete.   6.  Follow completed prep with 16 oz of clear liquid of your choice  (Nothing red or purple).  Continue to drink clear liquids until bedtime.  7.  Before going to bed, mix second dose of MoviPrep solution:    Empty 1 Pouch A and 1 Pouch B into the disposable container    Add lukewarm drinking water to the top line of the container. Mix to dissolve    Refrigerate  THE DAY OF YOUR PROCEDURE      DATE: 06-16-10 DAY: Monday  Beginning at 3:30 a.m. (5 hours before procedure):         1. Every 15 minutes, drink the solution down to the next mark (approx 8 oz) until the full liter is complete.  2. Follow completed prep with 16 oz. of clear liquid of your choice.    3. You may drink clear liquids until 6:30 a.m. (2 HOURS BEFORE PROCEDURE).   MEDICATION INSTRUCTIONS  Unless otherwise instructed, you should take regular prescription medications with a small sip of water   as early as possible the morning of your procedure.  OTHER INSTRUCTIONS  You will need a responsible adult at least 65 years of age to accompany you and drive you home.   This person must remain in the waiting room during your procedure.  Wear loose fitting clothing that is easily removed.  Leave jewelry and other valuables at home.  However, you may wish to bring a book to read or  an iPod/MP3 player to listen to music as you wait for your procedure to start.  Remove all body piercing jewelry and leave at home.  Total time from sign-in until discharge is approximately 2-3 hours.  You should go home directly after your procedure and rest.  You can resume normal activities the  day after your procedure.  The day of your procedure you should not:   Drive   Make legal decisions   Operate machinery   Drink alcohol   Return to work  You will receive specific instructions about eating, activities and medications before you leave.    The above instructions have been reviewed and explained to me by   Ezra Sites RN  June 09, 2010 8:51 AM     I fully  understand and can verbalize these instructions _____________________________ Date _________

## 2010-06-23 ENCOUNTER — Encounter: Payer: Self-pay | Admitting: Gastroenterology

## 2010-06-25 NOTE — Procedures (Addendum)
Summary: Colonoscopy  Patient: [name] Note: All result statuses are Final unless otherwise noted.  Tests: (1) Colonoscopy (COL)   COL Colonoscopy           DONE      [facility]     [address]     [place], [place]  [address]           COLONOSCOPY PROCEDURE REPORT           PATIENT:  Manuel Landry, Manuel Landry  MR#:  [id number]     BIRTHDATE:  03-20-1946, 64 yrs. old  GENDER:  male     ENDOSCOPIST:  Vania Rea. Jarold Motto, MD, [facility]     REF. BY:     PROCEDURE DATE:  06/16/2010     PROCEDURE:  Colonoscopy with snare polypectomy     ASA CLASS:  Class II     INDICATIONS:  Elevated Risk Screening mother with colon ca.     MEDICATIONS:   Fentanyl 50 mcg IV, Versed 6 mg IV           DESCRIPTION OF PROCEDURE:   After the risks benefits and     alternatives of the procedure were thoroughly explained, informed     consent was obtained.  Digital rectal exam was performed and     revealed no abnormalities.   The LB 180AL [id number] endoscope was     introduced through the anus and advanced to the cecum, which was     identified by both the appendix and ileocecal valve, without     limitations.  The quality of the prep was good, using MoviPrep.     The instrument was then slowly withdrawn as the colon was fully     examined.     <<PROCEDUREIMAGES>>           FINDINGS:  ULTRASONIC FINDINGS:  There were multiple polyps     identified and removed. at the hepatic flexure.  large,flat polyps     at hepatic flexure piecemeal removed. Severe diverticulosis was     found in the sigmoid to descending colon segments.   Retroflexed     views in the rectum revealed no abnormalities.    The scope was     then withdrawn from the patient and the procedure completed.           COMPLICATIONS:  None     ENDOSCOPIC IMPRESSION:     1) Polyps, multiple at the hepatic flexure     2) Severe diverticulosis in the sigmoid to descending colon     segments     R/O ADENOMAS.     RECOMMENDATIONS:     1)  Repeat Colonoscopy in 3 years.     2) high fiber diet     3) metamucil or benefiber     REPEAT EXAM:  No           ______________________________     Vania Rea. Jarold Motto, MD, [facility]           CC:           n.     eSIGNED:   Vania Rea. Bently Morath at 06/16/2010 09:13 AM           [name], [id number]  Note: An exclamation mark (!) indicates a result that was not dispersed into the flowsheet. Document Creation Date: 06/16/2010 9:14 AM _______________________________________________________________________  (1) Order result status: Final Collection or observation date-time: 06/16/2010 09:05 Requested date-time:  Receipt date-time:  Reported date-time:  Referring Physician:   Ordering Physician: Sheryn Bison [phone]) Specimen Source:  Source: [name] Order Number: [id number] Lab site:   Appended Document: Colonoscopy     Procedures Next Due Date:    Colonoscopy: 06/2013

## 2010-07-03 NOTE — Letter (Signed)
Summary: Patient Notice- Polyp Results  [facility]  [address] [place], [place] [address]   Phone: [phone]  Fax: [phone]        June 23, 2010 MRN: [id number]    Manuel Landry [address] [place], [place]  [address]    Dear [name],  I am pleased to inform you that the colon polyp(s) removed during your recent colonoscopy was (were) found to be benign (no cancer detected) upon pathologic examination.  I recommend you have a repeat colonoscopy examination in 3_ years to look for recurrent polyps, as having colon polyps increases your risk for having recurrent polyps or even colon cancer in the future.  Should you develop new or worsening symptoms of abdominal pain, bowel habit changes or bleeding from the rectum or bowels, please schedule an evaluation with either your primary care physician or with me.  Additional information/recommendations:  __ No further action with gastroenterology is needed at this time. Please      follow-up with your primary care physician for your other healthcare      needs.  __ Please call [phone] to schedule a return visit to review your      situation.  __ Please keep your follow-up visit as already scheduled.  _X_ Continue treatment plan as outlined the day of your exam.  Please call us if you are having persistent problems or have questions about your condition that have not been fully answered at this time.  Sincerely,  Mardella Layman MD [facility]  This letter has been electronically signed by your physician.  Appended Document: Patient Notice- Polyp Results Letter Mailed

## 2010-08-20 LAB — BASIC METABOLIC PANEL
BUN: 9 mg/dL (ref 6–23)
CO2: 29 mEq/L (ref 19–32)
Calcium: 9 mg/dL (ref 8.4–10.5)
Chloride: 98 mEq/L (ref 96–112)
Creatinine, Ser: 0.89 mg/dL (ref 0.4–1.5)
GFR calc Af Amer: >60 mL/min (ref >60)
GFR calc non Af Amer: >60 mL/min (ref >60)
Glucose, Bld: 104 mg/dL — ABNORMAL HIGH (ref 70–99)
Potassium: 4.2 mEq/L (ref 3.5–5.1)
Sodium: 133 mEq/L — ABNORMAL LOW (ref 135–145)

## 2010-08-20 LAB — CBC
HCT: 41.7 % (ref 39.0–52.0)
Hemoglobin: 14.4 g/dL (ref 13.0–17.0)
MCHC: 34.6 g/dL (ref 30.0–36.0)
MCV: 90.8 fL (ref 78.0–100.0)
Platelets: 214 10*3/uL (ref 150–400)
RBC: 4.59 MIL/uL (ref 4.22–5.81)
RDW: 13.1 % (ref 11.5–15.5)
WBC: 4.9 10*3/uL (ref 4.0–10.5)

## 2010-08-26 LAB — POCT HEMOGLOBIN-HEMACUE: Hemoglobin: 16.4 g/dL (ref 13.0–17.0)

## 2010-09-30 NOTE — Op Note (Signed)
Manuel Landry, Manuel Landry            ACCOUNT NO.:  [id number]   MEDICAL RECORD NO.:  [id number]          PATIENT TYPE:  AMB   LOCATION:  DSC                          FACILITY:  [facility]   PHYSICIAN:  Thornton Park. Daphine Deutscher, MD  DATE OF BIRTH:  June 16, 1945   DATE OF PROCEDURE:  09/28/2008  DATE OF DISCHARGE:                               OPERATIVE REPORT   PREOPERATIVE DIAGNOSIS:  Left inguinal hernia.   POSTOPERATIVE DIAGNOSIS:  Left direct inguinal hernia.   PROCEDURE:  Left inguinal hernia repair with UltraPro 2  mesh.   SURGEON:  Thornton Park. Daphine Deutscher, MD   ANESTHESIA:  General endotracheal.   FINDINGS:  Direct hernia.   DESCRIPTION OF PROCEDURE:  This 65 year old white male was taken to room  8 at [facility] on Friday, Sep 28, 2008, and given general  anesthesia.  He was prepped with a Techni-Care equivalent widely after  clipping the left side, which had been previously identified by him and  me both.  A small oblique incision was made and carried down to the  external oblique, which I incised down to the external ring.  I  mobilized the cord structures and in so doing, could see a sac coming  off posteriorly, which proved to be a fairly large direct sac.  I did,  however, go proximally and did not see an indirect sac.  I went ahead  and incised the direct sac and peeled away the preperitoneal fat and  then closed it with a running suture 2-0 Vicryl reducing that by the  excess and then over that I applied a piece of UltraPro 2 mesh suturing  it to the inguinal ligament with a running 2-0 Prolene up above the cord  exit site and then medially and then cutting and wrapping around the  cord and suturing with a horizontal mattress suture of 2-0 Prolene.  This was then tucked behind the external oblique.  The external oblique  was closed with a running 2-0 Vicryl.  The entire area was injected with  some Marcaine plain and closed in layers of 4-0 Vicryl and a running  subcuticular 5-0  Vicryl with benzoin and Steri-Strips.   The patient will be given Tylox for pain, will be followed up in the  office 3-4 weeks.      Thornton Park Daphine Deutscher, MD  Electronically Signed     MBM/MEDQ  D:  09/28/2008  T:  09/29/2008  Job:  [id number]   cc:   Gloriajean Dell. Andrey Campanile, M.D.  [facility]

## 2010-11-12 ENCOUNTER — Other Ambulatory Visit: Payer: Self-pay | Admitting: Gastroenterology

## 2011-02-13 ENCOUNTER — Other Ambulatory Visit: Payer: Self-pay | Admitting: Gastroenterology

## 2011-02-16 ENCOUNTER — Other Ambulatory Visit: Payer: Self-pay | Admitting: Gastroenterology

## 2011-02-16 MED ORDER — RABEPRAZOLE SODIUM 20 MG PO TBEC: DELAYED_RELEASE_TABLET | ORAL | Status: DC

## 2011-02-16 NOTE — Telephone Encounter (Signed)
Sent to pharmacy 

## 2011-05-13 ENCOUNTER — Other Ambulatory Visit: Payer: Self-pay | Admitting: Gastroenterology

## 2011-05-18 ENCOUNTER — Other Ambulatory Visit: Payer: Self-pay | Admitting: Gastroenterology

## 2011-05-20 ENCOUNTER — Other Ambulatory Visit: Payer: Self-pay | Admitting: Gastroenterology

## 2011-05-20 MED ORDER — RABEPRAZOLE SODIUM 20 MG PO TBEC: DELAYED_RELEASE_TABLET | ORAL | Status: DC

## 2011-05-20 NOTE — Telephone Encounter (Signed)
Called and spoke to pts wife and advised her that pt will have to have office visit with Dr Jarold Motto he has not seen him since 2010 he has only seen Manuel Esterwood, PA. i have gave him a 30 day supply until he can make an office visit, she will let him know

## 2011-05-21 DIAGNOSIS — N400 Enlarged prostate without lower urinary tract symptoms: Secondary | ICD-10-CM | POA: Diagnosis not present

## 2011-05-21 DIAGNOSIS — N4340 Spermatocele of epididymis, unspecified: Secondary | ICD-10-CM | POA: Diagnosis not present

## 2011-05-21 DIAGNOSIS — N529 Male erectile dysfunction, unspecified: Secondary | ICD-10-CM | POA: Diagnosis not present

## 2011-06-22 ENCOUNTER — Encounter: Payer: Self-pay | Admitting: Gastroenterology

## 2011-06-22 ENCOUNTER — Ambulatory Visit (INDEPENDENT_AMBULATORY_CARE_PROVIDER_SITE_OTHER): Payer: Medicare Other | Admitting: Gastroenterology

## 2011-06-22 VITALS — BP 104/72 | HR 66 | Ht 71.0 in | Wt 168.0 lb

## 2011-06-22 DIAGNOSIS — Z9089 Acquired absence of other organs: Secondary | ICD-10-CM

## 2011-06-22 DIAGNOSIS — Z860100 Personal history of colon polyps, unspecified: Secondary | ICD-10-CM

## 2011-06-22 DIAGNOSIS — K2270 Barrett's esophagus without dysplasia: Secondary | ICD-10-CM | POA: Diagnosis not present

## 2011-06-22 DIAGNOSIS — Z9049 Acquired absence of other specified parts of digestive tract: Secondary | ICD-10-CM

## 2011-06-22 MED ORDER — RABEPRAZOLE SODIUM 20 MG PO TBEC: DELAYED_RELEASE_TABLET | ORAL | Status: DC

## 2011-06-22 NOTE — Patient Instructions (Signed)
Your prescription(s) have been sent to you pharmacy.  Follow up in one year.   

## 2011-06-22 NOTE — Progress Notes (Signed)
This is a 66 year old Caucasian male status post cholecystectomy several years ago because of upper nominal discomfort. Endoscopy at That Time Was Unremarkable and Biopsies Did Not show evidence of Barrett's mucosa on review. He does take AcipHex 20 mg a day, Lactobacillus daily, daily psyllium. He denies chronic diarrhea, gas, bloating, melena or hematochezia. At the time of his colonoscopy he did have multiple colon polyps removed, and is due for followup in the next one to 2 years. His appetite is good and his weight is stable. He has regular annual physical exams with allegedly normal labs. He is followed by urology for BPH, takes Avodart daily and is tapering off of Flomax.  Current Medications, Allergies, Past Medical History, Past Surgical History, Family History and Social History were reviewed in [employer] record.  Pertinent Review of Systems Negative   Physical Exam: Healthy appearing patient in no distress. I cannot appreciate stigmata of chronic liver disease. Chest is clear and he is in a regular rhythm without murmurs gallops or rubs. There is no organomegaly, abnormal masses or tenderness. Bowel sounds are normal. Extremities are unremarkable and mental status is normal.    Assessment and Plan: His pathology report was apparently incorrectly tagged as" Barrett's mucosa.". On reviewing his endoscopic report, I do not think he needs followup endoscopy. He has no acid reflux symptoms since his cholecystectomy, and I have asked him to try to discontinue his AcipHex if possible. We will do followup colonoscopy scheduled. Otherwise he is to continue his medications as listed and reviewed.Please copy her primary care physician, referring physician, and pertinent subspecialists. Encounter Diagnosis  Name Primary?  . Barrett's esophagus Yes

## 2011-06-26 ENCOUNTER — Telehealth: Payer: Self-pay | Admitting: Gastroenterology

## 2011-06-26 DIAGNOSIS — K2270 Barrett's esophagus without dysplasia: Secondary | ICD-10-CM

## 2011-06-26 MED ORDER — RABEPRAZOLE SODIUM 20 MG PO TBEC: DELAYED_RELEASE_TABLET | ORAL | Status: DC

## 2011-06-26 NOTE — Telephone Encounter (Signed)
rx sent left message to let pt know

## 2011-08-05 ENCOUNTER — Encounter: Payer: Self-pay | Admitting: Cardiovascular Disease

## 2011-08-05 DIAGNOSIS — Z125 Encounter for screening for malignant neoplasm of prostate: Secondary | ICD-10-CM | POA: Diagnosis not present

## 2011-08-05 DIAGNOSIS — Z136 Encounter for screening for cardiovascular disorders: Secondary | ICD-10-CM | POA: Diagnosis not present

## 2011-08-05 DIAGNOSIS — Z0000 Encounter for general adult medical examination without abnormal findings: Secondary | ICD-10-CM | POA: Diagnosis not present

## 2011-08-05 DIAGNOSIS — Z5181 Encounter for therapeutic drug level monitoring: Secondary | ICD-10-CM | POA: Diagnosis not present

## 2011-08-05 DIAGNOSIS — Z23 Encounter for immunization: Secondary | ICD-10-CM | POA: Diagnosis not present

## 2011-08-11 DIAGNOSIS — M1710 Unilateral primary osteoarthritis, unspecified knee: Secondary | ICD-10-CM | POA: Diagnosis not present

## 2011-08-11 DIAGNOSIS — M5137 Other intervertebral disc degeneration, lumbosacral region: Secondary | ICD-10-CM | POA: Diagnosis not present

## 2011-08-11 DIAGNOSIS — M5126 Other intervertebral disc displacement, lumbar region: Secondary | ICD-10-CM | POA: Diagnosis not present

## 2011-08-12 ENCOUNTER — Encounter: Payer: Self-pay | Admitting: Cardiovascular Disease

## 2011-08-12 ENCOUNTER — Ambulatory Visit (INDEPENDENT_AMBULATORY_CARE_PROVIDER_SITE_OTHER): Payer: Medicare Other | Admitting: Cardiovascular Disease

## 2011-08-12 VITALS — BP 134/81 | HR 83 | Ht 71.0 in | Wt 170.0 lb

## 2011-08-12 DIAGNOSIS — Z9189 Other specified personal risk factors, not elsewhere classified: Secondary | ICD-10-CM | POA: Insufficient documentation

## 2011-08-12 NOTE — Patient Instructions (Signed)
Your physician wants you to follow-up in:  12 months.  You will receive a reminder letter in the mail two months in advance. If you don't receive a letter, please call our office to schedule the follow-up appointment.   

## 2011-08-12 NOTE — Progress Notes (Signed)
History of Present Illness: 66 yo WM with history GERD, HLD but no known CAD who is here today as a self referral to establish cardiology care. I take care of his wife. He has been doing well. He is active. He has no chest pain, SOB or palpitations. He does have problems with his hips and has bulging discs.   Primary Care Physician: Dr. Darrow Bussing  Last Lipid Profile:  March 2013: Total chol: 208  LDL 107   HDL 85  Past Medical History  Diagnosis Date  . Barrett's esophagus   . Unspecified gastritis and gastroduodenitis without mention of hemorrhage   . Stenosis of rectum and anus   . Backache, unspecified   . IBS (irritable bowel syndrome)   . Blind loop syndrome   . Inguinal hernia without mention of obstruction or gangrene, unilateral or unspecified, (not specified as recurrent)   . Family history of malignant neoplasm of gastrointestinal tract   . Bulging discs   . Vertigo   . Hyperlipidemia     Past Surgical History  Procedure Date  . Inguinal hernia repair     x 2  . Vasectomy   . Mandible surgery   . Cholecystectomy   . Rotator cuff repair   . Tonsillectomy     Current Outpatient Prescriptions  Medication Sig Dispense Refill  . Apoaequorin (PREVAGEN) 10 MG CAPS Take 1 capsule by mouth daily.      . Azelastine HCl 0.15 % SOLN Place 1 spray into the nose as needed.      . cetirizine (ZYRTEC) 10 MG chewable tablet Chew 10 mg by mouth as needed.      . clonazePAM (KLONOPIN) 0.5 MG tablet Take 0.5 mg by mouth as needed.      . Cyanocobalamin (VITAMIN B-12 CR PO) Take 1 capsule by mouth.      . cyclobenzaprine (FLEXERIL) 10 MG tablet Take 10 mg by mouth as needed.      . dutasteride (AVODART) 0.5 MG capsule Take 0.5 mg by mouth daily.      . Glucosamine-Chondroit-Vit C-Mn (GLUCOSAMINE 1500 COMPLEX PO) Take 1 capsule by mouth daily.      Marland Kitchen ibuprofen (ADVIL,MOTRIN) 800 MG tablet Take 800 mg by mouth as needed.      . Lactobacillus (DIGESTIVE HEALTH PROBIOTIC PO) Take  1 capsule by mouth daily.      . Multiple Vitamin (MULTIVITAMIN) capsule Take 1 capsule by mouth daily.      . Omega-3 Fatty Acids (FISH OIL) 1000 MG CAPS Take 1 capsule by mouth daily.      . RABEprazole (ACIPHEX) 20 MG tablet Take one tablet by mouth once a day  90 tablet  2  . Tamsulosin HCl (FLOMAX) 0.4 MG CAPS Take 0.4 mg by mouth. Take one tablet by mouth on Monday, Wednesday and Friday      . VOLTAREN 1 % GEL As directed        No Known Allergies  History   Social History  . Marital Status: Married    Spouse Name: N/A    Number of Children: 0  . Years of Education: N/A   Occupational History  . Retired    Social History Main Topics  . Smoking status: Never Smoker   . Smokeless tobacco: Never Used  . Alcohol Use: 3.5 oz/week    7 drink(s) per week     1 day  . Drug Use: No  . Sexually Active: Not on file  Other Topics Concern  . Not on file   Social History Narrative  . No narrative on file    Family History  Problem Relation Age of Onset  . Colon cancer Mother 50  . Diabetes Maternal Grandmother   . COPD Sister   . Heart failure Father 56    Review of Systems:  As stated in the HPI and otherwise negative.   BP 134/81  Pulse 83  Ht 5\' 11"  (1.803 m)  Wt 170 lb (77.111 kg)  BMI 23.71 kg/m2  Physical Examination: General: Well developed, well nourished, NAD HEENT: OP clear, mucus membranes moist SKIN: warm, dry. No rashes. Neuro: No focal deficits Musculoskeletal: Muscle strength 5/5 all ext Psychiatric: Mood and affect normal Neck: No JVD, no carotid bruits, no thyromegaly, no lymphadenopathy. Lungs:Clear bilaterally, no wheezes, rhonci, crackles Cardiovascular: Regular rate and rhythm. No murmurs, gallops or rubs. Abdomen:Soft. Bowel sounds present. Non-tender.  Extremities: No lower extremity edema. Pulses are 2 + in the bilateral DP/PT.  EKG: NSR, rate 80 bpm.

## 2011-08-12 NOTE — Assessment & Plan Note (Signed)
Pt here today to establish care. His only cardiac risk factors are age and hyperlipidemia. His lipids are managed in primary care. BP is well controlled. He is not a diabetic. He is doing well. No chest pain, SOB, palpitations. He is active. I do not think that any cardiac testing is indicated at this time. Will see back in one year.

## 2011-09-07 DIAGNOSIS — Z23 Encounter for immunization: Secondary | ICD-10-CM | POA: Diagnosis not present

## 2011-09-08 DIAGNOSIS — M169 Osteoarthritis of hip, unspecified: Secondary | ICD-10-CM | POA: Diagnosis not present

## 2011-09-18 DIAGNOSIS — T148 Other injury of unspecified body region: Secondary | ICD-10-CM | POA: Diagnosis not present

## 2011-09-23 DIAGNOSIS — M79609 Pain in unspecified limb: Secondary | ICD-10-CM | POA: Diagnosis not present

## 2011-10-01 DIAGNOSIS — M169 Osteoarthritis of hip, unspecified: Secondary | ICD-10-CM | POA: Diagnosis not present

## 2011-10-13 DIAGNOSIS — M79609 Pain in unspecified limb: Secondary | ICD-10-CM | POA: Diagnosis not present

## 2011-10-13 DIAGNOSIS — R599 Enlarged lymph nodes, unspecified: Secondary | ICD-10-CM | POA: Diagnosis not present

## 2011-10-19 DIAGNOSIS — M5137 Other intervertebral disc degeneration, lumbosacral region: Secondary | ICD-10-CM | POA: Diagnosis not present

## 2011-10-24 DIAGNOSIS — M5137 Other intervertebral disc degeneration, lumbosacral region: Secondary | ICD-10-CM | POA: Diagnosis not present

## 2011-10-28 DIAGNOSIS — R599 Enlarged lymph nodes, unspecified: Secondary | ICD-10-CM | POA: Diagnosis not present

## 2011-10-28 DIAGNOSIS — M549 Dorsalgia, unspecified: Secondary | ICD-10-CM | POA: Diagnosis not present

## 2011-11-02 DIAGNOSIS — M5137 Other intervertebral disc degeneration, lumbosacral region: Secondary | ICD-10-CM | POA: Diagnosis not present

## 2011-11-23 DIAGNOSIS — L821 Other seborrheic keratosis: Secondary | ICD-10-CM | POA: Diagnosis not present

## 2011-11-23 DIAGNOSIS — D239 Other benign neoplasm of skin, unspecified: Secondary | ICD-10-CM | POA: Diagnosis not present

## 2011-11-24 DIAGNOSIS — M5137 Other intervertebral disc degeneration, lumbosacral region: Secondary | ICD-10-CM | POA: Diagnosis not present

## 2011-12-09 DIAGNOSIS — H2510 Age-related nuclear cataract, unspecified eye: Secondary | ICD-10-CM | POA: Diagnosis not present

## 2011-12-10 DIAGNOSIS — N401 Enlarged prostate with lower urinary tract symptoms: Secondary | ICD-10-CM | POA: Diagnosis not present

## 2011-12-10 DIAGNOSIS — N529 Male erectile dysfunction, unspecified: Secondary | ICD-10-CM | POA: Diagnosis not present

## 2012-01-19 DIAGNOSIS — S46819A Strain of other muscles, fascia and tendons at shoulder and upper arm level, unspecified arm, initial encounter: Secondary | ICD-10-CM | POA: Diagnosis not present

## 2012-01-19 DIAGNOSIS — S43499A Other sprain of unspecified shoulder joint, initial encounter: Secondary | ICD-10-CM | POA: Diagnosis not present

## 2012-01-19 DIAGNOSIS — M5137 Other intervertebral disc degeneration, lumbosacral region: Secondary | ICD-10-CM | POA: Diagnosis not present

## 2012-01-19 DIAGNOSIS — M48061 Spinal stenosis, lumbar region without neurogenic claudication: Secondary | ICD-10-CM | POA: Diagnosis not present

## 2012-02-19 ENCOUNTER — Encounter: Payer: Self-pay | Admitting: Gastroenterology

## 2012-02-22 DIAGNOSIS — S43499A Other sprain of unspecified shoulder joint, initial encounter: Secondary | ICD-10-CM | POA: Diagnosis not present

## 2012-03-01 DIAGNOSIS — Z23 Encounter for immunization: Secondary | ICD-10-CM | POA: Diagnosis not present

## 2012-03-01 DIAGNOSIS — S43429A Sprain of unspecified rotator cuff capsule, initial encounter: Secondary | ICD-10-CM | POA: Diagnosis not present

## 2012-03-03 ENCOUNTER — Ambulatory Visit
Admission: RE | Admit: 2012-03-03 | Discharge: 2012-03-03 | Disposition: A | Discharge status: Home / Self Care | Payer: Medicare Other | Source: Ambulatory Visit | Attending: Family Medicine | Admitting: Family Medicine

## 2012-03-03 ENCOUNTER — Other Ambulatory Visit: Payer: Self-pay | Admitting: Family Medicine

## 2012-03-03 DIAGNOSIS — M719 Bursopathy, unspecified: Secondary | ICD-10-CM | POA: Diagnosis not present

## 2012-03-03 DIAGNOSIS — Z01818 Encounter for other preprocedural examination: Secondary | ICD-10-CM | POA: Diagnosis not present

## 2012-03-03 DIAGNOSIS — M67919 Unspecified disorder of synovium and tendon, unspecified shoulder: Secondary | ICD-10-CM | POA: Diagnosis not present

## 2012-03-08 ENCOUNTER — Other Ambulatory Visit: Payer: Self-pay | Admitting: Specialist

## 2012-03-09 ENCOUNTER — Encounter (HOSPITAL_COMMUNITY): Payer: Self-pay | Admitting: Pharmacy Technician

## 2012-03-10 ENCOUNTER — Telehealth: Payer: Self-pay | Admitting: Gastroenterology

## 2012-03-16 ENCOUNTER — Encounter (HOSPITAL_COMMUNITY)
Admission: RE | Admit: 2012-03-16 | Discharge: 2012-03-16 | Disposition: A | Discharge status: Home / Self Care | Payer: Medicare Other | Source: Ambulatory Visit | Attending: Specialist | Admitting: Specialist

## 2012-03-16 ENCOUNTER — Encounter (HOSPITAL_COMMUNITY): Payer: Self-pay

## 2012-03-16 DIAGNOSIS — E785 Hyperlipidemia, unspecified: Secondary | ICD-10-CM | POA: Diagnosis not present

## 2012-03-16 DIAGNOSIS — R42 Dizziness and giddiness: Secondary | ICD-10-CM | POA: Diagnosis not present

## 2012-03-16 DIAGNOSIS — S43429A Sprain of unspecified rotator cuff capsule, initial encounter: Secondary | ICD-10-CM | POA: Diagnosis not present

## 2012-03-16 DIAGNOSIS — Z79899 Other long term (current) drug therapy: Secondary | ICD-10-CM | POA: Diagnosis not present

## 2012-03-16 DIAGNOSIS — K589 Irritable bowel syndrome without diarrhea: Secondary | ICD-10-CM | POA: Diagnosis not present

## 2012-03-16 DIAGNOSIS — N400 Enlarged prostate without lower urinary tract symptoms: Secondary | ICD-10-CM | POA: Diagnosis not present

## 2012-03-16 DIAGNOSIS — Z01812 Encounter for preprocedural laboratory examination: Secondary | ICD-10-CM | POA: Diagnosis not present

## 2012-03-16 HISTORY — DX: Anxiety disorder, unspecified: F41.9

## 2012-03-16 NOTE — Patient Instructions (Signed)
YOUR SURGERY IS SCHEDULED AT [facility]  ON:    Thursday  10/31  AT 7:30 AM  REPORT TO [facility] AT:  5:30 AM      PHONE # FOR SHORT STAY IS [phone]  DO NOT EAT OR DRINK ANYTHING AFTER MIDNIGHT THE NIGHT BEFORE YOUR SURGERY.  YOU MAY BRUSH YOUR TEETH, RINSE OUT YOUR MOUTH--BUT NO WATER, NO FOOD, NO CHEWING GUM, NO MINTS, NO CANDIES, NO CHEWING TOBACCO.  PLEASE TAKE THE FOLLOWING MEDICATIONS THE AM OF YOUR SURGERY WITH A FEW SIPS OF WATER:  NO MEDICINES TO TAKE   IF YOU USE INHALERS--USE YOUR INHALERS THE AM OF YOUR SURGERY AND BRING INHALERS TO THE HOSPITAL -TAKE TO SURGERY.    IF YOU ARE DIABETIC:  DO NOT TAKE ANY DIABETIC MEDICATIONS THE AM OF YOUR SURGERY.  IF YOU TAKE INSULIN IN THE EVENINGS--PLEASE ONLY TAKE 1/2 NORMAL EVENING DOSE THE NIGHT BEFORE YOUR SURGERY.  NO INSULIN THE AM OF YOUR SURGERY.  IF YOU HAVE SLEEP APNEA AND USE CPAP OR BIPAP--PLEASE BRING THE MASK AND THE TUBING.  DO NOT BRING YOUR MACHINE.  DO NOT BRING VALUABLES, MONEY, CREDIT CARDS.  DO NOT WEAR JEWELRY, MAKE-UP, NAIL POLISH AND NO METAL PINS OR CLIPS IN YOUR HAIR. CONTACT LENS, DENTURES / PARTIALS, GLASSES SHOULD NOT BE WORN TO SURGERY AND IN MOST CASES-HEARING AIDS WILL NEED TO BE REMOVED.  BRING YOUR GLASSES CASE, ANY EQUIPMENT NEEDED FOR YOUR CONTACT LENS. FOR PATIENTS ADMITTED TO THE HOSPITAL--CHECK OUT TIME THE DAY OF DISCHARGE IS 11:00 AM.  ALL INPATIENT ROOMS ARE PRIVATE - WITH BATHROOM, TELEPHONE, TELEVISION AND WIFI INTERNET.  IF YOU ARE BEING DISCHARGED THE SAME DAY OF YOUR SURGERY--YOU CAN NOT DRIVE YOURSELF HOME--AND SHOULD NOT GO HOME ALONE BY TAXI OR BUS.  NO DRIVING OR OPERATING MACHINERY FOR 24 HOURS FOLLOWING ANESTHESIA / PAIN MEDICATIONS.  PLEASE MAKE ARRANGEMENTS FOR SOMEONE TO BE WITH YOU AT HOME THE FIRST 24 HOURS AFTER SURGERY. RESPONSIBLE DRIVER'S NAME___________________________                                               PHONE #   _______________________                                   PLEASE READ OVER ANY  FACT SHEETS THAT YOU WERE GIVEN: MRSA INFORMATION,INCENTIVE SPIROMETER INFORMATION.

## 2012-03-16 NOTE — Pre-Procedure Instructions (Signed)
PT HAD EKG,  CBC AND CMET DONE BY DR. Docia Chuck ON 03/03/12 -REPORTS ARE ON PT'S CHART.  PT'S CXR REPORT ALSO DONE 03/03/12 IS IN EPIC.  NO OTHER PREOP LABS NEEDED.

## 2012-03-17 ENCOUNTER — Encounter (HOSPITAL_COMMUNITY): Payer: Self-pay | Admitting: Registered Nurse

## 2012-03-17 ENCOUNTER — Encounter (HOSPITAL_COMMUNITY)
Admission: RE | Disposition: A | Discharge status: Home / Self Care | Payer: Self-pay | Source: Ambulatory Visit | Attending: Specialist

## 2012-03-17 ENCOUNTER — Ambulatory Visit (HOSPITAL_COMMUNITY): Payer: Medicare Other | Admitting: Registered Nurse

## 2012-03-17 ENCOUNTER — Ambulatory Visit (HOSPITAL_COMMUNITY)
Admission: RE | Admit: 2012-03-17 | Discharge: 2012-03-17 | Disposition: A | Discharge status: Home / Self Care | Payer: Medicare Other | Source: Ambulatory Visit | Attending: Specialist | Admitting: Specialist

## 2012-03-17 ENCOUNTER — Encounter (HOSPITAL_COMMUNITY): Payer: Self-pay | Admitting: *Deleted

## 2012-03-17 DIAGNOSIS — N400 Enlarged prostate without lower urinary tract symptoms: Secondary | ICD-10-CM | POA: Insufficient documentation

## 2012-03-17 DIAGNOSIS — K2270 Barrett's esophagus without dysplasia: Secondary | ICD-10-CM | POA: Diagnosis not present

## 2012-03-17 DIAGNOSIS — R42 Dizziness and giddiness: Secondary | ICD-10-CM | POA: Diagnosis not present

## 2012-03-17 DIAGNOSIS — Z01812 Encounter for preprocedural laboratory examination: Secondary | ICD-10-CM | POA: Insufficient documentation

## 2012-03-17 DIAGNOSIS — Z79899 Other long term (current) drug therapy: Secondary | ICD-10-CM | POA: Insufficient documentation

## 2012-03-17 DIAGNOSIS — S43429A Sprain of unspecified rotator cuff capsule, initial encounter: Secondary | ICD-10-CM | POA: Diagnosis not present

## 2012-03-17 DIAGNOSIS — X58XXXA Exposure to other specified factors, initial encounter: Secondary | ICD-10-CM | POA: Insufficient documentation

## 2012-03-17 DIAGNOSIS — E785 Hyperlipidemia, unspecified: Secondary | ICD-10-CM | POA: Diagnosis not present

## 2012-03-17 DIAGNOSIS — M75102 Unspecified rotator cuff tear or rupture of left shoulder, not specified as traumatic: Secondary | ICD-10-CM

## 2012-03-17 DIAGNOSIS — K589 Irritable bowel syndrome without diarrhea: Secondary | ICD-10-CM | POA: Insufficient documentation

## 2012-03-17 DIAGNOSIS — M75110 Incomplete rotator cuff tear or rupture of unspecified shoulder, not specified as traumatic: Secondary | ICD-10-CM | POA: Diagnosis not present

## 2012-03-17 HISTORY — PX: SHOULDER OPEN ROTATOR CUFF REPAIR: SHX2407

## 2012-03-17 SURGERY — REPAIR, ROTATOR CUFF, OPEN
Anesthesia: General | Site: Shoulder | Laterality: Left | Wound class: Clean

## 2012-03-17 MED ORDER — PHENYLEPHRINE HCL 10 MG/ML IJ SOLN
20.0000 mg | INTRAVENOUS | Status: DC | PRN
  Administered 2012-03-17: 15 ug/min via INTRAVENOUS

## 2012-03-17 MED ORDER — CYCLOBENZAPRINE HCL 10 MG PO TABS: 10.0000 mg | ORAL_TABLET | Freq: Three times a day (TID) | ORAL | Status: DC | PRN

## 2012-03-17 MED ORDER — HYDROCODONE-ACETAMINOPHEN 7.5-325 MG PO TABS: 1.0000 | ORAL_TABLET | ORAL | Status: DC | PRN

## 2012-03-17 MED ORDER — CEFAZOLIN SODIUM-DEXTROSE 2-3 GM-% IV SOLR
INTRAVENOUS | Status: AC
  Filled 2012-03-17: qty 50

## 2012-03-17 MED ORDER — BUPIVACAINE-EPINEPHRINE (PF) 0.5% -1:200000 IJ SOLN
INTRAMUSCULAR | Status: AC
  Filled 2012-03-17: qty 10

## 2012-03-17 MED ORDER — CEFAZOLIN SODIUM-DEXTROSE 2-3 GM-% IV SOLR
2.0000 g | Freq: Once | INTRAVENOUS | Status: AC
  Administered 2012-03-17: 2 g via INTRAVENOUS
  Filled 2012-03-17 (×2): qty 50

## 2012-03-17 MED ORDER — EPHEDRINE SULFATE 50 MG/ML IJ SOLN
INTRAMUSCULAR | Status: DC | PRN
  Administered 2012-03-17: 10 mg via INTRAVENOUS
  Administered 2012-03-17: 5 mg via INTRAVENOUS

## 2012-03-17 MED ORDER — LACTATED RINGERS IV SOLN: INTRAVENOUS | Status: DC

## 2012-03-17 MED ORDER — HYDROMORPHONE HCL PF 1 MG/ML IJ SOLN
INTRAMUSCULAR | Status: AC
  Filled 2012-03-17: qty 1

## 2012-03-17 MED ORDER — LACTATED RINGERS IV SOLN
INTRAVENOUS | Status: DC
  Administered 2012-03-17: 07:00:00 via INTRAVENOUS

## 2012-03-17 MED ORDER — NEOSTIGMINE METHYLSULFATE 1 MG/ML IJ SOLN
INTRAMUSCULAR | Status: DC | PRN
  Administered 2012-03-17: 4 mg via INTRAVENOUS

## 2012-03-17 MED ORDER — ACETAMINOPHEN 10 MG/ML IV SOLN
INTRAVENOUS | Status: DC | PRN
  Administered 2012-03-17: 1000 mg via INTRAVENOUS

## 2012-03-17 MED ORDER — CEFAZOLIN SODIUM-DEXTROSE 2-3 GM-% IV SOLR: 2.0000 g | INTRAVENOUS | Status: DC

## 2012-03-17 MED ORDER — LIDOCAINE HCL (CARDIAC) 20 MG/ML IV SOLN
INTRAVENOUS | Status: DC | PRN
  Administered 2012-03-17: 80 mg via INTRAVENOUS

## 2012-03-17 MED ORDER — HYDROMORPHONE HCL PF 1 MG/ML IJ SOLN
0.2500 mg | INTRAMUSCULAR | Status: DC | PRN
  Administered 2012-03-17: 0.5 mg via INTRAVENOUS

## 2012-03-17 MED ORDER — ONDANSETRON HCL 4 MG/2ML IJ SOLN
INTRAMUSCULAR | Status: DC | PRN
  Administered 2012-03-17: 4 mg via INTRAVENOUS

## 2012-03-17 MED ORDER — PHENYLEPHRINE HCL 10 MG/ML IJ SOLN
INTRAMUSCULAR | Status: DC | PRN
  Administered 2012-03-17 (×2): 80 ug via INTRAVENOUS

## 2012-03-17 MED ORDER — MIDAZOLAM HCL 5 MG/5ML IJ SOLN
INTRAMUSCULAR | Status: DC | PRN
  Administered 2012-03-17: 2 mg via INTRAVENOUS

## 2012-03-17 MED ORDER — BUPIVACAINE-EPINEPHRINE 0.5% -1:200000 IJ SOLN
INTRAMUSCULAR | Status: DC | PRN
  Administered 2012-03-17: 5 mL

## 2012-03-17 MED ORDER — FENTANYL CITRATE 0.05 MG/ML IJ SOLN
INTRAMUSCULAR | Status: DC | PRN
  Administered 2012-03-17: 50 ug via INTRAVENOUS

## 2012-03-17 MED ORDER — GLYCOPYRROLATE 0.2 MG/ML IJ SOLN
INTRAMUSCULAR | Status: DC | PRN
  Administered 2012-03-17: .6 mg via INTRAVENOUS

## 2012-03-17 MED ORDER — CHLORHEXIDINE GLUCONATE 4 % EX LIQD
60.0000 mL | Freq: Once | CUTANEOUS | Status: DC
  Filled 2012-03-17: qty 60

## 2012-03-17 MED ORDER — PROPOFOL 10 MG/ML IV BOLUS
INTRAVENOUS | Status: DC | PRN
  Administered 2012-03-17: 160 mg via INTRAVENOUS

## 2012-03-17 MED ORDER — ACETAMINOPHEN 10 MG/ML IV SOLN
INTRAVENOUS | Status: AC
  Filled 2012-03-17: qty 100

## 2012-03-17 MED ORDER — CEPHALEXIN 500 MG PO CAPS: 500.0000 mg | ORAL_CAPSULE | Freq: Four times a day (QID) | ORAL | Status: DC

## 2012-03-17 MED ORDER — SODIUM CHLORIDE 0.9 % IR SOLN
Status: DC | PRN
  Administered 2012-03-17: 08:00:00

## 2012-03-17 MED ORDER — CEFAZOLIN SODIUM-DEXTROSE 2-3 GM-% IV SOLR
2.0000 g | INTRAVENOUS | Status: AC
  Administered 2012-03-17: 2 g via INTRAVENOUS

## 2012-03-17 MED ORDER — ROCURONIUM BROMIDE 100 MG/10ML IV SOLN
INTRAVENOUS | Status: DC | PRN
  Administered 2012-03-17: 35 mg via INTRAVENOUS

## 2012-03-17 SURGICAL SUPPLY — 46 items
ANCHOR ROTATOR CUFF #2 (Anchor) ×4 IMPLANT
BAG ZIPLOCK 12X15 (MISCELLANEOUS) IMPLANT
BENZOIN TINCTURE PRP APPL 2/3 (GAUZE/BANDAGES/DRESSINGS) IMPLANT
BLADE OSCILLATING/SAGITTAL (BLADE) ×1
BLADE SW THK.38XMED LNG THN (BLADE) ×1 IMPLANT
BNDG COHESIVE 4X5 WHT NS (GAUZE/BANDAGES/DRESSINGS) IMPLANT
BUR OVAL CARBIDE 4.0 (BURR) ×2 IMPLANT
CHLORAPREP W/TINT 26ML (MISCELLANEOUS) ×2 IMPLANT
CLEANER TIP ELECTROSURG 2X2 (MISCELLANEOUS) IMPLANT
CLOTH BEACON ORANGE TIMEOUT ST (SAFETY) ×2 IMPLANT
CLSR STERI-STRIP ANTIMIC 1/2X4 (GAUZE/BANDAGES/DRESSINGS) ×2 IMPLANT
DECANTER SPIKE VIAL GLASS SM (MISCELLANEOUS) IMPLANT
DRAPE ORTHO SPLIT 77X108 STRL (DRAPES)
DRAPE POUCH INSTRU U-SHP 10X18 (DRAPES) ×2 IMPLANT
DRAPE SURG ORHT 6 SPLT 77X108 (DRAPES) IMPLANT
DRSG EMULSION OIL 3X3 NADH (GAUZE/BANDAGES/DRESSINGS) IMPLANT
DRSG MEPILEX BORDER 4X4 (GAUZE/BANDAGES/DRESSINGS) ×6 IMPLANT
DURAPREP 26ML APPLICATOR (WOUND CARE) IMPLANT
ELECT NEEDLE TIP 2.8 STRL (NEEDLE) ×2 IMPLANT
ELECT REM PT RETURN 9FT ADLT (ELECTROSURGICAL) ×2
ELECTRODE REM PT RTRN 9FT ADLT (ELECTROSURGICAL) ×1 IMPLANT
GLOVE BIOGEL PI IND STRL 8 (GLOVE) IMPLANT
GLOVE BIOGEL PI INDICATOR 8 (GLOVE)
GLOVE ECLIPSE 6.5 STRL STRAW (GLOVE) IMPLANT
GLOVE INDICATOR 6.5 STRL GRN (GLOVE) IMPLANT
GLOVE SURG SS PI 8.0 STRL IVOR (GLOVE) ×4 IMPLANT
GOWN PREVENTION PLUS LG XLONG (DISPOSABLE) ×2 IMPLANT
GOWN STRL REIN XL XLG (GOWN DISPOSABLE) ×2 IMPLANT
KIT BASIN OR (CUSTOM PROCEDURE TRAY) ×2 IMPLANT
MANIFOLD NEPTUNE II (INSTRUMENTS) IMPLANT
NEEDLE MA TROC 1/2 (NEEDLE) IMPLANT
NEEDLE MA TROC 1/2 CIR (NEEDLE) IMPLANT
PACK SHOULDER CUSTOM OPM052 (CUSTOM PROCEDURE TRAY) ×2 IMPLANT
POSITIONER SURGICAL ARM (MISCELLANEOUS) IMPLANT
PUSHLOCK PEEK 4.5X24 (Orthopedic Implant) ×4 IMPLANT
SLING ARM IMMOBILIZER LRG (SOFTGOODS) ×2 IMPLANT
SLING ULTRA II S (ORTHOPEDIC SUPPLIES) IMPLANT
SPONGE LAP 4X18 X RAY DECT (DISPOSABLE) ×2 IMPLANT
STRIP CLOSURE SKIN 1/2X4 (GAUZE/BANDAGES/DRESSINGS) ×2 IMPLANT
SUT BONE WAX W31G (SUTURE) ×2 IMPLANT
SUT ETHIBOND 0 (SUTURE) IMPLANT
SUT ETHIBOND 2 OS 4 DA (SUTURE) IMPLANT
SUT PROLENE 3 0 PS 2 (SUTURE) ×2 IMPLANT
SUT VIC AB 1-0 CT2 27 (SUTURE) ×4 IMPLANT
SUT VIC AB 2-0 CT2 27 (SUTURE) ×2 IMPLANT
SUT VICRYL 0 UR6 27IN ABS (SUTURE) IMPLANT

## 2012-03-17 NOTE — Anesthesia Preprocedure Evaluation (Addendum)
Anesthesia Evaluation  Patient identified by MRN, date of birth, ID band Patient awake    Reviewed: Allergy & Precautions, H&P , NPO status , Patient's Chart, lab work & pertinent test results  Airway Mallampati: II TM Distance: >3 FB Neck ROM: full    Dental No notable dental hx. (+) Teeth Intact and Dental Advisory Given   Pulmonary neg pulmonary ROS,  breath sounds clear to auscultation  Pulmonary exam normal       Cardiovascular Exercise Tolerance: Good negative cardio ROS  Rhythm:regular Rate:Normal     Neuro/Psych negative neurological ROS  negative psych ROS   GI/Hepatic negative GI ROS, Neg liver ROS, GERD-  Medicated and Controlled,  Endo/Other  negative endocrine ROS  Renal/GU negative Renal ROS  negative genitourinary   Musculoskeletal   Abdominal   Peds  Hematology negative hematology ROS (+)   Anesthesia Other Findings   Reproductive/Obstetrics negative OB ROS                           Anesthesia Physical Anesthesia Plan  ASA: I  Anesthesia Plan: General   Post-op Pain Management:    Induction: Intravenous  Airway Management Planned: Oral ETT  Additional Equipment:   Intra-op Plan:   Post-operative Plan: Extubation in OR  Informed Consent: I have reviewed the patients History and Physical, chart, labs and discussed the procedure including the risks, benefits and alternatives for the proposed anesthesia with the patient or authorized representative who has indicated his/her understanding and acceptance.   Dental Advisory Given  Plan Discussed with: CRNA and Surgeon  Anesthesia Plan Comments:         Anesthesia Quick Evaluation  

## 2012-03-17 NOTE — Brief Op Note (Signed)
03/17/2012  8:53 AM  PATIENT:  Manuel Landry  66 y.o. male  PRE-OPERATIVE DIAGNOSIS:  ROTATOR CUFF TEAR LEFT   POST-OPERATIVE DIAGNOSIS:  repair tendon  PROCEDURE:  Procedure(s) (LRB) with comments: ROTATOR CUFF REPAIR SHOULDER OPEN (Left) - LEFT MINI OPEN ROTATOR CUFF REPAIR   SURGEON:  Surgeon(s) and Role:    * Javier Docker, MD - Primary  PHYSICIAN ASSISTANT:   ASSISTANTS: none   ANESTHESIA:   general  EBL:  Total I/O In: 1000 [I.V.:1000] Out: 10 [Urine:10]  BLOOD ADMINISTERED:none  DRAINS: none   LOCAL MEDICATIONS USED:  MARCAINE     SPECIMEN:  No Specimen  DISPOSITION OF SPECIMEN:  N/A  COUNTS:  YES  TOURNIQUET:  * No tourniquets in log *  DICTATION: .Other Dictation: Dictation Number [id number]  PLAN OF CARE: Discharge to home after PACU  PATIENT DISPOSITION:  PACU - hemodynamically stable.   Delay start of Pharmacological VTE agent (>24hrs) due to surgical blood loss or risk of bleeding: no

## 2012-03-17 NOTE — Anesthesia Postprocedure Evaluation (Signed)
  Anesthesia Post-op Note  Patient: Manuel Landry  Procedure(s) Performed: Procedure(s) (LRB): ROTATOR CUFF REPAIR SHOULDER OPEN (Left)  Patient Location: PACU  Anesthesia Type: General  Level of Consciousness: awake and alert   Airway and Oxygen Therapy: Patient Spontanous Breathing  Post-op Pain: mild  Post-op Assessment: Post-op Vital signs reviewed, Patient's Cardiovascular Status Stable, Respiratory Function Stable, Patent Airway and No signs of Nausea or vomiting  Post-op Vital Signs: stable  Complications: No apparent anesthesia complications

## 2012-03-17 NOTE — Preoperative (Signed)
Beta Blockers   Reason not to administer Beta Blockers:Not Applicable 

## 2012-03-17 NOTE — Transfer of Care (Signed)
Immediate Anesthesia Transfer of Care Note  Patient: Manuel Landry  Procedure(s) Performed: Procedure(s) (LRB) with comments: ROTATOR CUFF REPAIR SHOULDER OPEN (Left) - LEFT MINI OPEN ROTATOR CUFF REPAIR   Patient Location: PACU  Anesthesia Type:General  Level of Consciousness: awake, alert , oriented and patient cooperative  Airway & Oxygen Therapy: Patient Spontanous Breathing and Patient connected to face mask oxygen  Post-op Assessment: Report given to PACU RN, Post -op Vital signs reviewed and stable and Patient moving all extremities  Post vital signs: Reviewed and stable  Complications: No apparent anesthesia complications

## 2012-03-17 NOTE — H&P (Signed)
Manuel Landry is an 66 y.o. male.   Chief Complaint: Left RCT HPI: Tear RC SX  Past Medical History  Diagnosis Date  . Barrett's esophagus     NOT A PROBLEM AT THE CURRENT TIME  . Unspecified gastritis and gastroduodenitis without mention of hemorrhage   . Stenosis of rectum and anus   . Backache, unspecified   . IBS (irritable bowel syndrome)   . Blind loop syndrome   . Family history of malignant neoplasm of gastrointestinal tract   . Bulging discs     LOWER BACK  . Vertigo   . Hyperlipidemia   . Anxiety   . BPH (benign prostatic hypertrophy)     DR. DAHLSTEDT    Past Surgical History  Procedure Date  . Inguinal hernia repair     x 2  . Vasectomy   . Mandible surgery   . Cholecystectomy   . Rotator cuff repair     RIGHT  . Tonsillectomy     Family History  Problem Relation Age of Onset  . Colon cancer Mother 6  . Diabetes Maternal Grandmother   . COPD Sister   . Heart failure Father 50   Social History:  reports that he has never smoked. He has never used smokeless tobacco. He reports that he drinks about 3.5 ounces of alcohol per week. He reports that he does not use illicit drugs.  Allergies: No Known Allergies  Medications Prior to Admission  Medication Sig Dispense Refill  . Apoaequorin (PREVAGEN) 10 MG CAPS Take 1 capsule by mouth daily.      Marland Kitchen azelastine (ASTELIN) 137 MCG/SPRAY nasal spray Place 1 spray into the nose 2 (two) times daily. Use in each nostril as directed      . azelastine (ASTELIN) 137 MCG/SPRAY nasal spray Place 1 spray into the nose. Pt states his spray is 0.1% and he only uses nasal spray if needed.      . Azelastine HCl 0.15 % SOLN Place 1 spray into the nose as needed.      . cetirizine (ZYRTEC) 10 MG chewable tablet Chew 10 mg by mouth as needed.      . Cholecalciferol (VITAMIN D-3) 5000 UNITS TABS Take 5,000 mg by mouth daily. Monday through friday      . Cyanocobalamin (VITAMIN B-12 CR PO) Take 1 capsule by mouth.      .  cyclobenzaprine (FLEXERIL) 10 MG tablet Take 10 mg by mouth 3 (three) times daily as needed. For pain      . diclofenac sodium (VOLTAREN) 1 % GEL Apply 1 g topically 2 (two) times daily. To painful areas on shoulders      . dutasteride (AVODART) 0.5 MG capsule Take 0.5 mg by mouth daily. At night      . Glucosamine-Chondroitin (MOVE FREE PO) Take 1 tablet by mouth daily.      Marland Kitchen ibuprofen (ADVIL,MOTRIN) 800 MG tablet Take 800 mg by mouth every 8 (eight) hours as needed. For pain      . Krill Oil 300 MG CAPS Take by mouth.      . Lactobacillus (DIGESTIVE HEALTH PROBIOTIC PO) Take 1 capsule by mouth daily.      . Multiple Vitamin (MULTIVITAMIN) capsule Take 1 capsule by mouth daily.      . Tamsulosin HCl (FLOMAX) 0.4 MG CAPS Take 0.4 mg by mouth daily. 30 MIN AFTER SUPPER      . clonazePAM (KLONOPIN) 0.5 MG tablet Take 0.5 mg by mouth as needed.  For STRESS--BUT PT HAS NEVER USED        Results for orders placed during the hospital encounter of 03/16/12 (from the past 48 hour(s))  SURGICAL PCR SCREEN     Status: Normal   Collection Time   03/16/12  8:25 AM      Component Value Range Comment   MRSA, PCR NEGATIVE  NEGATIVE    Staphylococcus aureus NEGATIVE  NEGATIVE    No results found.  Review of Systems  Constitutional: Negative.   HENT: Negative.   Eyes: Negative.   Respiratory: Negative.   Cardiovascular: Negative.   Gastrointestinal: Negative.   Genitourinary: Negative.   Musculoskeletal: Positive for joint pain.  Skin: Negative.   Neurological: Negative.   Endo/Heme/Allergies: Negative.   Psychiatric/Behavioral: Negative.     Blood pressure 124/84, pulse 90, temperature 97.3 F (36.3 C), temperature source Oral, resp. rate 18, SpO2 96.00%. Physical Exam  Constitutional: He is oriented to person, place, and time. He appears well-developed.  HENT:  Head: Normocephalic.  Eyes: Pupils are equal, round, and reactive to light.  Neck: Normal range of motion.  Cardiovascular:  Normal rate.   Respiratory: Effort normal.  GI: Soft.  Musculoskeletal:       +impingement left. Weak ER. NVI  Neurological: He is alert and oriented to person, place, and time.  Skin: Skin is warm and dry.  Psychiatric: He has a normal mood and affect.     Assessment/Plan Left RCT refractory. Plan RCR. Risks discussed.  Avi Archuleta C 03/17/2012, 7:32 AM

## 2012-03-18 ENCOUNTER — Encounter (HOSPITAL_COMMUNITY): Payer: Self-pay | Admitting: Specialist

## 2012-03-18 NOTE — Op Note (Signed)
Manuel Landry, AULD NO.:  [id number]  MEDICAL RECORD NO.:  [id number]  LOCATION:  [facility]                         FACILITY:  [facility]  PHYSICIAN:  Jene Every, M.D.    DATE OF BIRTH:  [birth year]  DATE OF PROCEDURE:  03/17/2012 DATE OF DISCHARGE:  03/17/2012                              OPERATIVE REPORT   PREOPERATIVE DIAGNOSIS:  Rotator cuff tear, left shoulder.  POSTOPERATIVE DIAGNOSIS:  Rotator cuff tear, left shoulder.  PROCEDURE PERFORMED:  Mini open rotator cuff repair with subacromial decompression utilizing two Mitek suture anchors, acromioplasty.  ANESTHESIA:  General.  ASSISTANT:  None.  HISTORY:  A 66 year old, full-thickness tear of supraspinatus, indicated for repair and subacromial decompression.  Risks and benefits were discussed including bleeding, infection, nonhealing, suboptimal range of motion, DVT, PE, anesthetic complications, etc.  TECHNIQUE:  With the patient supine in beach-chair position after induction of adequate general anesthesia, 2 g of Kefzol, the left shoulder and upper extremity was prepped and draped in usual sterile fashion.  Ranged under anesthesia, slight decrease in forward flexion, which was improved with gentle stretching.  Surgical marker was utilized on the acromion, AC joint and coracoid, small 2-cm incision was made over the anterolateral aspect of the acromion.  Subcutaneous tissue was dissected.  Electrocautery was utilized to achieve hemostasis.  A 0.25% Marcaine with epinephrine was infiltrated in the deltoid region. Identified the raphe between the anterolateral heads, divided it in line with the skin incision.  Subperiosteally elevated it from the anterolateral and anteromedial aspect of the acromion preserving the attachment.  Small acromial spur was removed with 3-mm Kerrison. Detached the CA ligament and removed it.  I digitally lysed the adhesions in the subacromial space.  Excised the bursa.   Split longitudinal tear in the supraspinatus was noted.  We debrided the skin edges with good bleeding tissue, mobilized the cuff.  This was approximately 2 cm in length.  I then fashioned bed in cancellous bone with a Bayer rongeur just lateral to the articular surface.  Placed two Mitek suture anchors one at the articular interface and two outer aspect of the greater tuberosity.  Threaded them up through the supraspinatus and repaired it side-to-side with good surgeon's knot delivered into the bed.  I then oversewed with 0 Vicryl simple sutures.  Excellent full repair was noted.  Remainder of the cuff was unremarkable.  Copiously irrigated with antibiotic irrigation and repaired the raphe with 1 Vicryl interrupted figure-of-8 sutures, subcu with 2-0 Vicryl simple sutures.  Skin was reapproximated with 4-0 subcuticular Prolene.  Wound was reinforced with Steri-Strips, sterile dressing applied.  Placed in a sling, extubated without difficulty, and transported to the recovery room in satisfactory condition.  The patient tolerated the procedure well.  No complications.  No assistant.  Minimal blood loss.     Jene Every, M.D.     Cordelia Pen  D:  03/17/2012  T:  03/18/2012  Job:  [id number]

## 2012-03-23 NOTE — Telephone Encounter (Signed)
Pt had his shoulder surgery 2 weeks ago and is doing well. We discussed his Recall in 2015; informed him we will contact him in January, 2015. Pt stated understanding.

## 2012-04-05 DIAGNOSIS — M75120 Complete rotator cuff tear or rupture of unspecified shoulder, not specified as traumatic: Secondary | ICD-10-CM | POA: Diagnosis not present

## 2012-04-07 DIAGNOSIS — M75120 Complete rotator cuff tear or rupture of unspecified shoulder, not specified as traumatic: Secondary | ICD-10-CM | POA: Diagnosis not present

## 2012-04-12 DIAGNOSIS — M75120 Complete rotator cuff tear or rupture of unspecified shoulder, not specified as traumatic: Secondary | ICD-10-CM | POA: Diagnosis not present

## 2012-04-19 DIAGNOSIS — M75120 Complete rotator cuff tear or rupture of unspecified shoulder, not specified as traumatic: Secondary | ICD-10-CM | POA: Diagnosis not present

## 2012-04-21 DIAGNOSIS — M75120 Complete rotator cuff tear or rupture of unspecified shoulder, not specified as traumatic: Secondary | ICD-10-CM | POA: Diagnosis not present

## 2012-04-26 DIAGNOSIS — M75120 Complete rotator cuff tear or rupture of unspecified shoulder, not specified as traumatic: Secondary | ICD-10-CM | POA: Diagnosis not present

## 2012-04-28 DIAGNOSIS — M75120 Complete rotator cuff tear or rupture of unspecified shoulder, not specified as traumatic: Secondary | ICD-10-CM | POA: Diagnosis not present

## 2012-05-03 DIAGNOSIS — M75120 Complete rotator cuff tear or rupture of unspecified shoulder, not specified as traumatic: Secondary | ICD-10-CM | POA: Diagnosis not present

## 2012-05-04 DIAGNOSIS — H938X9 Other specified disorders of ear, unspecified ear: Secondary | ICD-10-CM | POA: Diagnosis not present

## 2012-05-04 DIAGNOSIS — H6980 Other specified disorders of Eustachian tube, unspecified ear: Secondary | ICD-10-CM | POA: Diagnosis not present

## 2012-05-05 DIAGNOSIS — M75120 Complete rotator cuff tear or rupture of unspecified shoulder, not specified as traumatic: Secondary | ICD-10-CM | POA: Diagnosis not present

## 2012-05-09 DIAGNOSIS — Z9889 Other specified postprocedural states: Secondary | ICD-10-CM | POA: Diagnosis not present

## 2012-05-17 DIAGNOSIS — M75120 Complete rotator cuff tear or rupture of unspecified shoulder, not specified as traumatic: Secondary | ICD-10-CM | POA: Diagnosis not present

## 2012-05-19 DIAGNOSIS — M75120 Complete rotator cuff tear or rupture of unspecified shoulder, not specified as traumatic: Secondary | ICD-10-CM | POA: Diagnosis not present

## 2012-05-24 DIAGNOSIS — M75120 Complete rotator cuff tear or rupture of unspecified shoulder, not specified as traumatic: Secondary | ICD-10-CM | POA: Diagnosis not present

## 2012-05-26 DIAGNOSIS — M75120 Complete rotator cuff tear or rupture of unspecified shoulder, not specified as traumatic: Secondary | ICD-10-CM | POA: Diagnosis not present

## 2012-05-27 DIAGNOSIS — R351 Nocturia: Secondary | ICD-10-CM | POA: Diagnosis not present

## 2012-05-27 DIAGNOSIS — N401 Enlarged prostate with lower urinary tract symptoms: Secondary | ICD-10-CM | POA: Diagnosis not present

## 2012-05-31 DIAGNOSIS — M75120 Complete rotator cuff tear or rupture of unspecified shoulder, not specified as traumatic: Secondary | ICD-10-CM | POA: Diagnosis not present

## 2012-06-02 DIAGNOSIS — M75120 Complete rotator cuff tear or rupture of unspecified shoulder, not specified as traumatic: Secondary | ICD-10-CM | POA: Diagnosis not present

## 2012-06-07 DIAGNOSIS — M75120 Complete rotator cuff tear or rupture of unspecified shoulder, not specified as traumatic: Secondary | ICD-10-CM | POA: Diagnosis not present

## 2012-06-09 DIAGNOSIS — M75120 Complete rotator cuff tear or rupture of unspecified shoulder, not specified as traumatic: Secondary | ICD-10-CM | POA: Diagnosis not present

## 2012-06-13 DIAGNOSIS — M75120 Complete rotator cuff tear or rupture of unspecified shoulder, not specified as traumatic: Secondary | ICD-10-CM | POA: Diagnosis not present

## 2012-06-17 DIAGNOSIS — M75120 Complete rotator cuff tear or rupture of unspecified shoulder, not specified as traumatic: Secondary | ICD-10-CM | POA: Diagnosis not present

## 2012-06-21 DIAGNOSIS — M75120 Complete rotator cuff tear or rupture of unspecified shoulder, not specified as traumatic: Secondary | ICD-10-CM | POA: Diagnosis not present

## 2012-06-23 DIAGNOSIS — M75120 Complete rotator cuff tear or rupture of unspecified shoulder, not specified as traumatic: Secondary | ICD-10-CM | POA: Diagnosis not present

## 2012-06-28 DIAGNOSIS — M75120 Complete rotator cuff tear or rupture of unspecified shoulder, not specified as traumatic: Secondary | ICD-10-CM | POA: Diagnosis not present

## 2012-07-02 ENCOUNTER — Other Ambulatory Visit: Payer: Self-pay

## 2012-07-04 DIAGNOSIS — M75120 Complete rotator cuff tear or rupture of unspecified shoulder, not specified as traumatic: Secondary | ICD-10-CM | POA: Diagnosis not present

## 2012-07-07 DIAGNOSIS — M75120 Complete rotator cuff tear or rupture of unspecified shoulder, not specified as traumatic: Secondary | ICD-10-CM | POA: Diagnosis not present

## 2012-07-11 DIAGNOSIS — M75120 Complete rotator cuff tear or rupture of unspecified shoulder, not specified as traumatic: Secondary | ICD-10-CM | POA: Diagnosis not present

## 2012-07-14 DIAGNOSIS — M75120 Complete rotator cuff tear or rupture of unspecified shoulder, not specified as traumatic: Secondary | ICD-10-CM | POA: Diagnosis not present

## 2012-08-11 DIAGNOSIS — Z5181 Encounter for therapeutic drug level monitoring: Secondary | ICD-10-CM | POA: Diagnosis not present

## 2012-08-11 DIAGNOSIS — Z0000 Encounter for general adult medical examination without abnormal findings: Secondary | ICD-10-CM | POA: Diagnosis not present

## 2012-08-11 DIAGNOSIS — Z125 Encounter for screening for malignant neoplasm of prostate: Secondary | ICD-10-CM | POA: Diagnosis not present

## 2012-08-11 DIAGNOSIS — Z136 Encounter for screening for cardiovascular disorders: Secondary | ICD-10-CM | POA: Diagnosis not present

## 2012-08-30 DIAGNOSIS — M545 Low back pain: Secondary | ICD-10-CM | POA: Diagnosis not present

## 2012-09-06 DIAGNOSIS — M545 Low back pain: Secondary | ICD-10-CM | POA: Diagnosis not present

## 2012-09-08 DIAGNOSIS — M545 Low back pain: Secondary | ICD-10-CM | POA: Diagnosis not present

## 2012-09-20 DIAGNOSIS — M545 Low back pain: Secondary | ICD-10-CM | POA: Diagnosis not present

## 2012-09-22 DIAGNOSIS — M545 Low back pain: Secondary | ICD-10-CM | POA: Diagnosis not present

## 2012-09-27 DIAGNOSIS — M545 Low back pain: Secondary | ICD-10-CM | POA: Diagnosis not present

## 2012-09-30 DIAGNOSIS — M545 Low back pain: Secondary | ICD-10-CM | POA: Diagnosis not present

## 2012-10-04 DIAGNOSIS — M545 Low back pain: Secondary | ICD-10-CM | POA: Diagnosis not present

## 2012-11-21 ENCOUNTER — Other Ambulatory Visit: Payer: Self-pay | Admitting: Dermatology

## 2012-11-21 DIAGNOSIS — L821 Other seborrheic keratosis: Secondary | ICD-10-CM | POA: Diagnosis not present

## 2012-11-21 DIAGNOSIS — D1801 Hemangioma of skin and subcutaneous tissue: Secondary | ICD-10-CM | POA: Diagnosis not present

## 2012-11-21 DIAGNOSIS — L739 Follicular disorder, unspecified: Secondary | ICD-10-CM | POA: Diagnosis not present

## 2012-11-21 DIAGNOSIS — D485 Neoplasm of uncertain behavior of skin: Secondary | ICD-10-CM | POA: Diagnosis not present

## 2012-11-21 DIAGNOSIS — T148 Other injury of unspecified body region: Secondary | ICD-10-CM | POA: Diagnosis not present

## 2012-11-21 DIAGNOSIS — W57XXXA Bitten or stung by nonvenomous insect and other nonvenomous arthropods, initial encounter: Secondary | ICD-10-CM | POA: Diagnosis not present

## 2012-12-14 DIAGNOSIS — H2510 Age-related nuclear cataract, unspecified eye: Secondary | ICD-10-CM | POA: Diagnosis not present

## 2012-12-21 ENCOUNTER — Other Ambulatory Visit: Payer: Self-pay | Admitting: Dermatology

## 2012-12-21 ENCOUNTER — Other Ambulatory Visit: Payer: Self-pay

## 2012-12-21 DIAGNOSIS — D485 Neoplasm of uncertain behavior of skin: Secondary | ICD-10-CM | POA: Diagnosis not present

## 2013-03-23 ENCOUNTER — Other Ambulatory Visit: Payer: Self-pay

## 2013-03-25 ENCOUNTER — Encounter (INDEPENDENT_AMBULATORY_CARE_PROVIDER_SITE_OTHER): Payer: Medicare Other | Admitting: Ophthalmology

## 2013-03-25 DIAGNOSIS — H43819 Vitreous degeneration, unspecified eye: Secondary | ICD-10-CM

## 2013-03-31 ENCOUNTER — Encounter: Payer: Self-pay | Admitting: Gastroenterology

## 2013-04-05 ENCOUNTER — Telehealth: Payer: Self-pay | Admitting: Gastroenterology

## 2013-04-05 DIAGNOSIS — D692 Other nonthrombocytopenic purpura: Secondary | ICD-10-CM | POA: Diagnosis not present

## 2013-04-05 DIAGNOSIS — D239 Other benign neoplasm of skin, unspecified: Secondary | ICD-10-CM | POA: Diagnosis not present

## 2013-04-05 NOTE — Telephone Encounter (Signed)
Pt received a letter Dr Jarold Motto is retiring. They wanted to know which doctor they will get and asked about Dr Rhea Belton. Dr Jarold Motto, OK for pt to transfer to Dr Lonna Cobb? Thanks

## 2013-04-06 NOTE — Telephone Encounter (Signed)
lmom for pt to call  °

## 2013-04-06 NOTE — Telephone Encounter (Signed)
Dr Pyrtle, will you accept this pt? Thanks. 

## 2013-04-06 NOTE — Telephone Encounter (Signed)
Patient should remain with Dr. Jarold Motto until he retires then I'm happy to accept

## 2013-04-06 NOTE — Telephone Encounter (Signed)
Dr, Rhea Belton

## 2013-04-12 DIAGNOSIS — H2510 Age-related nuclear cataract, unspecified eye: Secondary | ICD-10-CM | POA: Diagnosis not present

## 2013-04-12 DIAGNOSIS — H40019 Open angle with borderline findings, low risk, unspecified eye: Secondary | ICD-10-CM | POA: Diagnosis not present

## 2013-04-20 DIAGNOSIS — Z23 Encounter for immunization: Secondary | ICD-10-CM | POA: Diagnosis not present

## 2013-06-02 DIAGNOSIS — N401 Enlarged prostate with lower urinary tract symptoms: Secondary | ICD-10-CM | POA: Diagnosis not present

## 2013-06-02 DIAGNOSIS — N139 Obstructive and reflux uropathy, unspecified: Secondary | ICD-10-CM | POA: Diagnosis not present

## 2013-06-28 NOTE — Telephone Encounter (Signed)
lmom for pt to call back

## 2013-06-28 NOTE — Telephone Encounter (Signed)
Spoke with pt and scheduled him to see Dr Hilarie Fredrickson on 08/07/13 at 1:30pm. He is due his recall COLON; he states he did not do well with the last prep and neither did his wife.

## 2013-08-04 ENCOUNTER — Encounter: Payer: Self-pay | Admitting: Internal Medicine

## 2013-08-07 ENCOUNTER — Ambulatory Visit (INDEPENDENT_AMBULATORY_CARE_PROVIDER_SITE_OTHER): Payer: Medicare Other | Admitting: Internal Medicine

## 2013-08-07 ENCOUNTER — Encounter: Payer: Self-pay | Admitting: Internal Medicine

## 2013-08-07 VITALS — BP 120/80 | HR 72 | Ht 70.0 in | Wt 176.0 lb

## 2013-08-07 DIAGNOSIS — Z800 Family history of malignant neoplasm of digestive organs: Secondary | ICD-10-CM

## 2013-08-07 DIAGNOSIS — K2270 Barrett's esophagus without dysplasia: Secondary | ICD-10-CM | POA: Diagnosis not present

## 2013-08-07 DIAGNOSIS — Z86010 Personal history of colonic polyps: Secondary | ICD-10-CM

## 2013-08-07 MED ORDER — MOVIPREP 100 G PO SOLR: ORAL | Status: DC

## 2013-08-07 NOTE — Progress Notes (Signed)
Subjective:    Patient ID: Manuel Landry, male    DOB: [birth year], 68 y.o.   MRN: [id number]  HPI Manuel Landry is a 68 yo male with [facility] of adenomatous colon polyps, Barrett's esophagus, hyperlipidemia, BPH, degenerative disc disease, and a family history of colon cancer in his mother who is seen for followup. He was previously followed by Dr. Sharlett Iles prior to his retirement. He is seeing me for the first time today and he is alone today. He reports he is feeling well. He does develop pressure and bloating in his abdomen from time to time and this has been present for over 2 years his cholecystectomy. He's tried over-the-counter medicines like Gas-X which has not been helpful. He is now using a probiotic which he says has helped some. Stools changed after cholecystectomy but had been stable for him at present. Some mild constipation. No diarrhea. No blood in his stool or melena. No abdominal pain. Very very rare heartburn but no dysphagia or odynophagia. No nausea or vomiting. No decreased appetite or weight loss. He reports Dr. Sharlett Iles felt like he probably does not have Barrett's despite being told that he did in 2010 after biopsy. He did take AcipHex for some time but does not take PPI at present  Past Medical History  Diagnosis Date  . Barrett's esophagus     NOT A PROBLEM AT THE CURRENT TIME  . Unspecified gastritis and gastroduodenitis without mention of hemorrhage   . Stenosis of rectum and anus   . Backache, unspecified   . IBS (irritable bowel syndrome)   . Blind loop syndrome   . Family history of malignant neoplasm of gastrointestinal tract   . Bulging discs     LOWER BACK  . Vertigo   . Hyperlipidemia   . Anxiety   . BPH (benign prostatic hypertrophy)     DR. DAHLSTEDT  . Diverticulosis   . Adenomatous colon polyp       Review of Systems As per HPI, otherwise negative  Current Medications, Allergies, Past Medical History, Past Surgical History, Family History and  Social History were reviewed in [employer] record.     Objective:   Physical Exam BP 120/80  Pulse 72  Ht 5\' 10"  (1.778 m)  Wt 176 lb (79.833 kg)  BMI 25.25 kg/m2 Constitutional: Well-developed and well-nourished. No distress. HEENT: Normocephalic and atraumatic. Oropharynx is clear and moist. No oropharyngeal exudate. Conjunctivae are normal.  No scleral icterus. Neck: Neck supple. Trachea midline. Cardiovascular: Normal rate, regular rhythm and intact distal pulses. No M/R/G Pulmonary/chest: Effort normal and breath sounds normal. No wheezing, rales or rhonchi. Abdominal: Soft, nontender, nondistended. Bowel sounds active throughout.  Extremities: no clubbing, cyanosis, or edema Lymphadenopathy: No cervical adenopathy noted. Neurological: Alert and oriented to person place and time. Skin: Skin is warm and dry. No rashes noted. Psychiatric: Normal mood and affect. Behavior is normal.  EGD 06/13/2008 -- irregular Z line, biopsied. Moderate gastritis. Duodenitis. Pathology = benign small bowel mucosa the villous atrophy identified. Benign gastric mucosa. No Helicobacter pylori intestinal metaplasia or active inflammation. Esophagus intestinal metaplasia no dysplasia or malignancy Colonoscopy 06/16/2010 -- multiple polyps removed at hepatic flexure large and flat. Piecemeal resection. Severe diverticulosis in the left colon. Pathology = tubular adenoma, hyperplastic polyp. No high-grade dysplasia    Assessment & Plan:  68 yo male with [facility] of adenomatous colon polyps, Barrett's esophagus, hyperlipidemia, BPH, degenerative disc disease, and a family history of colon cancer in his mother  who is seen for followup.  1.  Hx of adenomatous colon polyps/family hx of colon cancer -- due to his history of adenomatous colon polyps and the fact that one was labeled "large" and removed in piecemeal fashion, I recommended repeat colonoscopy at this time. Dr. Sharlett Iles had also  previously recommended a 3 year interval. We discussed the test today including the risks and benefits and he is agreeable to proceed  2.  Barrett's esophagus without GERD symptomatology -- he did have biopsy proven intestinal metaplasia in 2010. He does not have significant GERD symptoms at present, nor does he have upper GI alarm symptoms. I have recommended repeating the upper endoscopy for repeat biopsy if his Z line remains irregular or if there is endoscopic evidence for probable Barrett's. If it is indeed found to persist, my recommendation would be to resume daily PPI. We discussed EGD including the risks benefits and he is agreeable to proceed  Further recommendations after procedures

## 2013-08-07 NOTE — Patient Instructions (Signed)
You have been scheduled for a colonoscopy/Endoscopy with propofol. Please follow written instructions given to you at your visit today.  Please pick up your prep kit at the pharmacy within the next 1-3 days. If you use inhalers (even only as needed), please bring them with you on the day of your procedure. Your physician has requested that you go to www.startemmi.com and enter the access code given to you at your visit today. This web site gives a general overview about your procedure. However, you should still follow specific instructions given to you by our office regarding your preparation for the procedure.   We have sent the following medications to your pharmacy for you to pick up at your convenience: Moviprep   

## 2013-08-15 DIAGNOSIS — Z23 Encounter for immunization: Secondary | ICD-10-CM | POA: Diagnosis not present

## 2013-08-15 DIAGNOSIS — Z0000 Encounter for general adult medical examination without abnormal findings: Secondary | ICD-10-CM | POA: Diagnosis not present

## 2013-08-15 DIAGNOSIS — N529 Male erectile dysfunction, unspecified: Secondary | ICD-10-CM | POA: Diagnosis not present

## 2013-08-15 DIAGNOSIS — Z136 Encounter for screening for cardiovascular disorders: Secondary | ICD-10-CM | POA: Diagnosis not present

## 2013-08-15 DIAGNOSIS — Z5181 Encounter for therapeutic drug level monitoring: Secondary | ICD-10-CM | POA: Diagnosis not present

## 2013-09-19 ENCOUNTER — Encounter: Payer: Self-pay | Admitting: Internal Medicine

## 2013-09-26 ENCOUNTER — Ambulatory Visit (AMBULATORY_SURGERY_CENTER): Payer: Medicare Other | Admitting: Internal Medicine

## 2013-09-26 ENCOUNTER — Encounter: Payer: Self-pay | Admitting: Internal Medicine

## 2013-09-26 VITALS — BP 149/82 | HR 73 | Temp 98.0°F | Resp 13 | Ht 70.0 in | Wt 176.0 lb

## 2013-09-26 DIAGNOSIS — D126 Benign neoplasm of colon, unspecified: Secondary | ICD-10-CM | POA: Diagnosis not present

## 2013-09-26 DIAGNOSIS — Z86010 Personal history of colonic polyps: Secondary | ICD-10-CM

## 2013-09-26 DIAGNOSIS — Z1211 Encounter for screening for malignant neoplasm of colon: Secondary | ICD-10-CM

## 2013-09-26 DIAGNOSIS — K2100 Gastro-esophageal reflux disease with esophagitis, without bleeding: Secondary | ICD-10-CM

## 2013-09-26 DIAGNOSIS — K219 Gastro-esophageal reflux disease without esophagitis: Secondary | ICD-10-CM | POA: Diagnosis not present

## 2013-09-26 DIAGNOSIS — K2270 Barrett's esophagus without dysplasia: Secondary | ICD-10-CM

## 2013-09-26 DIAGNOSIS — K2940 Chronic atrophic gastritis without bleeding: Secondary | ICD-10-CM | POA: Diagnosis not present

## 2013-09-26 DIAGNOSIS — I10 Essential (primary) hypertension: Secondary | ICD-10-CM | POA: Diagnosis not present

## 2013-09-26 DIAGNOSIS — K589 Irritable bowel syndrome without diarrhea: Secondary | ICD-10-CM | POA: Diagnosis not present

## 2013-09-26 MED ORDER — SODIUM CHLORIDE 0.9 % IV SOLN: 500.0000 mL | INTRAVENOUS | Status: DC

## 2013-09-26 NOTE — Progress Notes (Signed)
Report to pacu rn, vss, bbs=clear 

## 2013-09-26 NOTE — Patient Instructions (Signed)
YOU HAD AN ENDOSCOPIC PROCEDURE TODAY AT THE [facility]: Refer to the procedure report that was given to you for any specific questions about what was found during the examination.  If the procedure report does not answer your questions, please call your gastroenterologist to clarify.  If you requested that your care partner not be given the details of your procedure findings, then the procedure report has been included in a sealed envelope for you to review at your convenience later.  YOU SHOULD EXPECT: Some feelings of bloating in the abdomen. Passage of more gas than usual.  Walking can help get rid of the air that was put into your GI tract during the procedure and reduce the bloating. If you had a lower endoscopy (such as a colonoscopy or flexible sigmoidoscopy) you may notice spotting of blood in your stool or on the toilet paper. If you underwent a bowel prep for your procedure, then you may not have a normal bowel movement for a few days.  DIET: Your first meal following the procedure should be a light meal and then it is ok to progress to your normal diet.  A half-sandwich or bowl of soup is an example of a good first meal.  Heavy or fried foods are harder to digest and may make you feel nauseous or bloated.  Likewise meals heavy in dairy and vegetables can cause extra gas to form and this can also increase the bloating.  Drink plenty of fluids but you should avoid alcoholic beverages for 24 hours.  ACTIVITY: Your care partner should take you home directly after the procedure.  You should plan to take it easy, moving slowly for the rest of the day.  You can resume normal activity the day after the procedure however you should NOT DRIVE or use heavy machinery for 24 hours (because of the sedation medicines used during the test).    SYMPTOMS TO REPORT IMMEDIATELY: A gastroenterologist can be reached at any hour.  During normal business hours, 8:30 AM to 5:00 PM Monday through Friday,  call [phone].  After hours and on weekends, please call the GI answering service at [phone] who will take a message and have the physician on call contact you.   Following lower endoscopy (colonoscopy or flexible sigmoidoscopy):  Excessive amounts of blood in the stool  Significant tenderness or worsening of abdominal pains  Swelling of the abdomen that is new, acute  Fever of 100F or higher  Following upper endoscopy (EGD)  Vomiting of blood or coffee ground material  New chest pain or pain under the shoulder blades  Painful or persistently difficult swallowing  New shortness of breath  Fever of 100F or higher  Black, tarry-looking stools  FOLLOW UP: If any biopsies were taken you will be contacted by phone or by letter within the next 1-3 weeks.  Call your gastroenterologist if you have not heard about the biopsies in 3 weeks.  Our staff will call the home number listed on your records the next business day following your procedure to check on you and address any questions or concerns that you may have at that time regarding the information given to you following your procedure. This is a courtesy call and so if there is no answer at the home number and we have not heard from you through the emergency physician on call, we will assume that you have returned to your regular daily activities without incident.  SIGNATURES/CONFIDENTIALITY: You and/or your care   partner have signed paperwork which will be entered into your electronic medical record.  These signatures attest to the fact that that the information above on your After Visit Summary has been reviewed and is understood.  Full responsibility of the confidentiality of this discharge information lies with you and/or your care-partner.  Gastritis, polyps, Diverticulosis, high fiber diet -handouts given  Avoid NSAIDs for 2 weeks (ibuprofen, advil, aleve, motrin, naproxen, etc)  Repeat colonoscopy will be determined by  pathology results.

## 2013-09-26 NOTE — Op Note (Signed)
[place]  [address] [place], [address]   COLONOSCOPY PROCEDURE REPORT  PATIENT: Manuel, Landry  MR#: [id number] BIRTHDATE: [birth year] , 68  yrs. old GENDER: Male ENDOSCOPIST: Jerene Bears, MD PROCEDURE DATE:  09/26/2013 PROCEDURE:   Colonoscopy with snare polypectomy and Colonoscopy with cold biopsy polypectomy First Screening Colonoscopy - Avg.  risk and is 50 yrs.  old or older - No.  Prior Negative Screening - Now for repeat screening. N/A  History of Adenoma - Now for follow-up colonoscopy & has been > or = to 3 yrs.  Yes hx of adenoma.  Has been 3 or more years since last colonoscopy.  Polyps Removed Today? Yes. ASA CLASS:   Class III INDICATIONS:elevated risk screening and Patient's personal history of adenomatous colon polyps. MEDICATIONS: MAC sedation, administered by CRNA and Propofol (Diprivan) 120 (300 total combined procedure) mg IV  DESCRIPTION OF PROCEDURE:   After the risks benefits and alternatives of the procedure were thoroughly explained, informed consent was obtained.  A digital rectal exam revealed no rectal mass.   The LB [id number]  endoscope was introduced through the anus and advanced to the cecum, which was identified by both the appendix and ileocecal valve. No adverse events experienced. The quality of the prep was Moviprep fair  The instrument was then slowly withdrawn as the colon was fully examined.   COLON FINDINGS: Six sessile polyps measuring 3-6 mm in size were found at the cecum (1), in the ascending colon (1), transverse colon (2), descending colon (1), and sigmoid colon (1). Polypectomy was performed using cold snare (5) and with cold forceps (1).  All resections were complete and all polyp tissue was completely retrieved.   Moderate diverticulosis was noted in the descending colon and sigmoid colon.  Retroflexed views revealed external hemorrhoids. The time to cecum=3 minutes 36 seconds. Withdrawal  time=18 minutes 04 seconds.  The scope was withdrawn and the procedure completed. COMPLICATIONS: There were no complications.  ENDOSCOPIC IMPRESSION: 1.   Six sessile polyps measuring 3-6 mm in size were found at the cecum, in the ascending colon, transverse colon, descending colon, and sigmoid colon; Polypectomy was performed using cold snare and with cold forceps 2.   Moderate diverticulosis was noted in the descending colon and sigmoid colon  RECOMMENDATIONS: 1.  Await pathology results 2.  Avoid all NSAIDS for the next 2 weeks. 3.  High fiber diet 4.  Timing of repeat colonoscopy will be determined by pathology findings. 5.  You will receive a letter within 1-2 weeks with the results of your biopsy as well as final recommendations.  Please call my office if you have not received a letter after 3 weeks.   eSigned:  Jerene Bears, MD 09/26/2013 3:00 PM       cc: The Patient, Dibas Dorthy Cooler, MD

## 2013-09-26 NOTE — Progress Notes (Signed)
Called to room to assist during endoscopic procedure.  Patient ID and intended procedure confirmed with present staff. Received instructions for my participation in the procedure from the performing physician.  

## 2013-09-26 NOTE — Op Note (Signed)
[place]  [address]. [place], [address]   ENDOSCOPY PROCEDURE REPORT  PATIENT: Manuel Landry, Manuel Landry  MR#: [id number] BIRTHDATE: March 31, 1946 , 68  yrs. old GENDER: Male ENDOSCOPIST: Jerene Bears, MD PROCEDURE DATE:  09/26/2013 PROCEDURE:  EGD w/ biopsy ASA CLASS:     Class III INDICATIONS:  history of Barrett's esophagus. MEDICATIONS: MAC sedation, administered by CRNA, Propofol (Diprivan), and Propofol (Diprivan) 180 mg IV TOPICAL ANESTHETIC: Cetacaine Spray  DESCRIPTION OF PROCEDURE: After the risks benefits and alternatives of the procedure were thoroughly explained, informed consent was obtained.  The LB [id number] endoscope was introduced through the mouth and advanced to the second portion of the duodenum. Without limitations.  The instrument was slowly withdrawn as the mucosa was fully examined.   ESOPHAGUS: An irregular Z-line was observed 39 cm from the incisors. Multiple biopsies were performed using cold forceps.  Sample obtained to rule out Barrett's esophagus.   The esophagus was otherwise normal.  STOMACH: There was mild antral gastropathy noted.  Cold forcep biopsies were taken at the antrum and angularis.  Otherwise normal stomach.  DUODENUM: Mild duodenal inflammation was found in the duodenal bulb. Normal examined 2nd part of duodenum. Retroflexed views revealed no abnormalities.     The scope was then withdrawn from the patient and the procedure completed.  COMPLICATIONS: There were no complications.  ENDOSCOPIC IMPRESSION: 1.   Irregular Z-line was observed 39 cm from the incisors; multiple biopsies 2.   The esophagus was otherwise normal. 3.   There was mild antral gastropathy noted; multiple biopsies 4.   Duodenal inflammation was found in the duodenal bulb with normal exam and second part of duodenum  RECOMMENDATIONS: Await biopsy results  eSigned:  Jerene Bears, MD 09/26/2013 2:54 PM   CC:The Patient; Lujean Amel, MD

## 2013-09-27 ENCOUNTER — Telehealth: Payer: Self-pay

## 2013-09-27 NOTE — Telephone Encounter (Signed)
  Follow up Call-  Call back number 09/26/2013  Post procedure Call Back phone  # [phone]  Permission to leave phone message Yes     Patient questions:  Do you have a fever, pain , or abdominal swelling? no Pain Score  0 *  Have you tolerated food without any problems? yes  Have you been able to return to your normal activities? yes  Do you have any questions about your discharge instructions: Diet   no Medications  no Follow up visit  no  Do you have questions or concerns about your Care? no  Actions: * If pain score is 4 or above: No action needed, pain <4.

## 2013-10-04 ENCOUNTER — Encounter: Payer: Self-pay | Admitting: Internal Medicine

## 2013-10-05 DIAGNOSIS — H40019 Open angle with borderline findings, low risk, unspecified eye: Secondary | ICD-10-CM | POA: Diagnosis not present

## 2013-11-20 DIAGNOSIS — L570 Actinic keratosis: Secondary | ICD-10-CM | POA: Diagnosis not present

## 2013-11-20 DIAGNOSIS — D239 Other benign neoplasm of skin, unspecified: Secondary | ICD-10-CM | POA: Diagnosis not present

## 2013-11-20 DIAGNOSIS — L819 Disorder of pigmentation, unspecified: Secondary | ICD-10-CM | POA: Diagnosis not present

## 2013-11-20 DIAGNOSIS — D1801 Hemangioma of skin and subcutaneous tissue: Secondary | ICD-10-CM | POA: Diagnosis not present

## 2014-01-23 ENCOUNTER — Encounter: Payer: Self-pay | Admitting: Gastroenterology

## 2014-02-16 DIAGNOSIS — Z23 Encounter for immunization: Secondary | ICD-10-CM | POA: Diagnosis not present

## 2014-03-19 DIAGNOSIS — S96911A Strain of unspecified muscle and tendon at ankle and foot level, right foot, initial encounter: Secondary | ICD-10-CM | POA: Diagnosis not present

## 2014-04-03 DIAGNOSIS — S86311A Strain of muscle(s) and tendon(s) of peroneal muscle group at lower leg level, right leg, initial encounter: Secondary | ICD-10-CM | POA: Diagnosis not present

## 2014-04-19 DIAGNOSIS — S86311A Strain of muscle(s) and tendon(s) of peroneal muscle group at lower leg level, right leg, initial encounter: Secondary | ICD-10-CM | POA: Diagnosis not present

## 2014-04-23 DIAGNOSIS — S86311A Strain of muscle(s) and tendon(s) of peroneal muscle group at lower leg level, right leg, initial encounter: Secondary | ICD-10-CM | POA: Diagnosis not present

## 2014-04-26 DIAGNOSIS — S86311A Strain of muscle(s) and tendon(s) of peroneal muscle group at lower leg level, right leg, initial encounter: Secondary | ICD-10-CM | POA: Diagnosis not present

## 2014-05-07 DIAGNOSIS — S86311A Strain of muscle(s) and tendon(s) of peroneal muscle group at lower leg level, right leg, initial encounter: Secondary | ICD-10-CM | POA: Diagnosis not present

## 2014-05-09 DIAGNOSIS — S86311A Strain of muscle(s) and tendon(s) of peroneal muscle group at lower leg level, right leg, initial encounter: Secondary | ICD-10-CM | POA: Diagnosis not present

## 2014-05-14 DIAGNOSIS — S86311A Strain of muscle(s) and tendon(s) of peroneal muscle group at lower leg level, right leg, initial encounter: Secondary | ICD-10-CM | POA: Diagnosis not present

## 2014-05-16 DIAGNOSIS — S86311A Strain of muscle(s) and tendon(s) of peroneal muscle group at lower leg level, right leg, initial encounter: Secondary | ICD-10-CM | POA: Diagnosis not present

## 2014-05-21 DIAGNOSIS — N5201 Erectile dysfunction due to arterial insufficiency: Secondary | ICD-10-CM | POA: Diagnosis not present

## 2014-05-21 DIAGNOSIS — N400 Enlarged prostate without lower urinary tract symptoms: Secondary | ICD-10-CM | POA: Diagnosis not present

## 2014-06-25 DIAGNOSIS — M2580 Other specified joint disorders, unspecified joint: Secondary | ICD-10-CM | POA: Diagnosis not present

## 2014-06-25 DIAGNOSIS — M65871 Other synovitis and tenosynovitis, right ankle and foot: Secondary | ICD-10-CM | POA: Diagnosis not present

## 2014-06-25 DIAGNOSIS — M7751 Other enthesopathy of right foot: Secondary | ICD-10-CM | POA: Diagnosis not present

## 2014-07-03 DIAGNOSIS — M7751 Other enthesopathy of right foot: Secondary | ICD-10-CM | POA: Diagnosis not present

## 2014-07-03 DIAGNOSIS — M65871 Other synovitis and tenosynovitis, right ankle and foot: Secondary | ICD-10-CM | POA: Diagnosis not present

## 2014-07-10 DIAGNOSIS — M7751 Other enthesopathy of right foot: Secondary | ICD-10-CM | POA: Diagnosis not present

## 2014-07-10 DIAGNOSIS — M65871 Other synovitis and tenosynovitis, right ankle and foot: Secondary | ICD-10-CM | POA: Diagnosis not present

## 2014-08-17 DIAGNOSIS — Z79899 Other long term (current) drug therapy: Secondary | ICD-10-CM | POA: Diagnosis not present

## 2014-08-17 DIAGNOSIS — M1990 Unspecified osteoarthritis, unspecified site: Secondary | ICD-10-CM | POA: Diagnosis not present

## 2014-08-17 DIAGNOSIS — Z0000 Encounter for general adult medical examination without abnormal findings: Secondary | ICD-10-CM | POA: Diagnosis not present

## 2014-08-17 DIAGNOSIS — Z125 Encounter for screening for malignant neoplasm of prostate: Secondary | ICD-10-CM | POA: Diagnosis not present

## 2014-08-17 DIAGNOSIS — Z136 Encounter for screening for cardiovascular disorders: Secondary | ICD-10-CM | POA: Diagnosis not present

## 2014-08-17 DIAGNOSIS — M79671 Pain in right foot: Secondary | ICD-10-CM | POA: Diagnosis not present

## 2014-11-05 DIAGNOSIS — H524 Presbyopia: Secondary | ICD-10-CM | POA: Diagnosis not present

## 2014-11-05 DIAGNOSIS — H40013 Open angle with borderline findings, low risk, bilateral: Secondary | ICD-10-CM | POA: Diagnosis not present

## 2014-11-05 DIAGNOSIS — H52223 Regular astigmatism, bilateral: Secondary | ICD-10-CM | POA: Diagnosis not present

## 2014-11-05 DIAGNOSIS — H2513 Age-related nuclear cataract, bilateral: Secondary | ICD-10-CM | POA: Diagnosis not present

## 2014-11-05 DIAGNOSIS — H5213 Myopia, bilateral: Secondary | ICD-10-CM | POA: Diagnosis not present

## 2014-11-07 DIAGNOSIS — M79675 Pain in left toe(s): Secondary | ICD-10-CM | POA: Diagnosis not present

## 2014-11-12 ENCOUNTER — Ambulatory Visit: Payer: Medicare Other

## 2014-11-12 ENCOUNTER — Encounter: Payer: Self-pay | Admitting: Podiatry

## 2014-11-12 ENCOUNTER — Ambulatory Visit (INDEPENDENT_AMBULATORY_CARE_PROVIDER_SITE_OTHER): Payer: Medicare Other

## 2014-11-12 ENCOUNTER — Ambulatory Visit (INDEPENDENT_AMBULATORY_CARE_PROVIDER_SITE_OTHER): Payer: Medicare Other | Admitting: Podiatry

## 2014-11-12 VITALS — BP 150/90 | HR 90 | Resp 15

## 2014-11-12 DIAGNOSIS — M779 Enthesopathy, unspecified: Secondary | ICD-10-CM | POA: Diagnosis not present

## 2014-11-12 DIAGNOSIS — M79675 Pain in left toe(s): Secondary | ICD-10-CM

## 2014-11-12 DIAGNOSIS — M79671 Pain in right foot: Secondary | ICD-10-CM

## 2014-11-12 MED ORDER — TRIAMCINOLONE ACETONIDE 10 MG/ML IJ SUSP
10.0000 mg | Freq: Once | INTRAMUSCULAR | Status: AC
  Administered 2014-11-12: 10 mg

## 2014-11-12 NOTE — Progress Notes (Signed)
Subjective:     Patient ID: Manuel Landry, male   DOB: 1945/12/30, 69 y.o.   MRN: [id number]  HPI patient presents with pain in the plantar aspect of the right foot stating that he had several injections which have not been successful and the foot is giving him problems for years but over the last few months has grown quite a bit more discomfort   Review of Systems  All other systems reviewed and are negative.      Objective:   Physical Exam  Constitutional: He is oriented to person, place, and time.  Cardiovascular: Intact distal pulses.   Musculoskeletal: Normal range of motion.  Neurological: He is oriented to person, place, and time.  Skin: Skin is warm.  Nursing note and vitals reviewed.  neurovascular status intact muscle strength adequate with range of motion within normal limits. Patient does have mild diffuse increase in pressure against the right first metatarsal head but the vast majority the pain appears to be on the fibular sesamoid when palpated with minimal medial discomfort or distal discomfort noted. Patient's found to be well oriented 3 with good digital perfusion     Assessment:     Appears to be inflammatory sesamoiditis right fibular with structure of the foot as part of the biomechanical problem    Plan:     H&P and multiple view x-rays of both feet reviewed with patient including the fact that with marker it does appear to be the problem is around the fibular sesamoid. Today coming from a dorsal direction I did inject around the sesamoid complex 3 mg Kenalog 5 mill grams Xylocaine and then applied air fracture walker for complete immobilization of the big toe. If symptoms persist or recur work and the need to consider fibular sesamoidectomy

## 2014-11-12 NOTE — Progress Notes (Signed)
   Subjective:    Patient ID: Manuel Landry, male    DOB: 1945/09/09, 69 y.o.   MRN: [id number]  HPI Pt presents with pain in his right foot, at the MPJ joint plantarly. States that he has suffered with pain for many years and has sought treatment from various doctors for this pain and pain of his right ankle. He has tried injections, orthotics and consults with several doctors. States that the pain feels like he is walkiing on gravel   Review of Systems  All other systems reviewed and are negative.      Objective:   Physical Exam        Assessment & Plan:

## 2014-11-22 DIAGNOSIS — D225 Melanocytic nevi of trunk: Secondary | ICD-10-CM | POA: Diagnosis not present

## 2014-11-22 DIAGNOSIS — D2272 Melanocytic nevi of left lower limb, including hip: Secondary | ICD-10-CM | POA: Diagnosis not present

## 2014-11-22 DIAGNOSIS — D1801 Hemangioma of skin and subcutaneous tissue: Secondary | ICD-10-CM | POA: Diagnosis not present

## 2014-11-22 DIAGNOSIS — D2271 Melanocytic nevi of right lower limb, including hip: Secondary | ICD-10-CM | POA: Diagnosis not present

## 2014-11-22 DIAGNOSIS — L814 Other melanin hyperpigmentation: Secondary | ICD-10-CM | POA: Diagnosis not present

## 2014-11-29 ENCOUNTER — Ambulatory Visit (INDEPENDENT_AMBULATORY_CARE_PROVIDER_SITE_OTHER): Payer: Medicare Other

## 2014-11-29 ENCOUNTER — Telehealth: Payer: Self-pay | Admitting: *Deleted

## 2014-11-29 ENCOUNTER — Ambulatory Visit (INDEPENDENT_AMBULATORY_CARE_PROVIDER_SITE_OTHER): Payer: Medicare Other | Admitting: Podiatry

## 2014-11-29 ENCOUNTER — Encounter: Payer: Self-pay | Admitting: Podiatry

## 2014-11-29 VITALS — BP 137/86 | HR 69 | Resp 12

## 2014-11-29 DIAGNOSIS — M779 Enthesopathy, unspecified: Secondary | ICD-10-CM

## 2014-11-29 DIAGNOSIS — D169 Benign neoplasm of bone and articular cartilage, unspecified: Secondary | ICD-10-CM

## 2014-11-29 DIAGNOSIS — M79671 Pain in right foot: Secondary | ICD-10-CM

## 2014-11-29 NOTE — Patient Instructions (Signed)
Pre-Operative Instructions  Congratulations, you have decided to take an important step to improving your quality of life.  You can be assured that the doctors of [facility] will be with you every step of the way.  1. Plan to be at the surgery center/hospital at least 1 (one) hour prior to your scheduled time unless otherwise directed by the surgical center/hospital staff.  You must have a responsible adult accompany you, remain during the surgery and drive you home.  Make sure you have directions to the surgical center/hospital and know how to get there on time. 2. For hospital based surgery you will need to obtain a history and physical form from your family physician within 1 month prior to the date of surgery- we will give you a form for you primary physician.  3. We make every effort to accommodate the date you request for surgery.  There are however, times where surgery dates or times have to be moved.  We will contact you as soon as possible if a change in schedule is required.   4. No Aspirin/Ibuprofen for one week before surgery.  If you are on aspirin, any non-steroidal anti-inflammatory medications (Mobic, Aleve, Ibuprofen) you should stop taking it 7 days prior to your surgery.  You make take Tylenol  For pain prior to surgery.  5. Medications- If you are taking daily heart and blood pressure medications, seizure, reflux, allergy, asthma, anxiety, pain or diabetes medications, make sure the surgery center/hospital is aware before the day of surgery so they may notify you which medications to take or avoid the day of surgery. 6. No food or drink after midnight the night before surgery unless directed otherwise by surgical center/hospital staff. 7. No alcoholic beverages 24 hours prior to surgery.  No smoking 24 hours prior to or 24 hours after surgery. 8. Wear loose pants or shorts- loose enough to fit over bandages, boots, and casts. 9. No slip on shoes, sneakers are best. 10. Bring  your boot with you to the surgery center/hospital.  Also bring crutches or a walker if your physician has prescribed it for you.  If you do not have this equipment, it will be provided for you after surgery. 11. If you have not been contracted by the surgery center/hospital by the day before your surgery, call to confirm the date and time of your surgery. 12. Leave-time from work may vary depending on the type of surgery you have.  Appropriate arrangements should be made prior to surgery with your employer. 13. Prescriptions will be provided immediately following surgery by your doctor.  Have these filled as soon as possible after surgery and take the medication as directed. 14. Remove nail polish on the operative foot. 15. Wash the night before surgery.  The night before surgery wash the foot and leg well with the antibacterial soap provided and water paying special attention to beneath the toenails and in between the toes.  Rinse thoroughly with water and dry well with a towel.  Perform this wash unless told not to do so by your physician.  Enclosed: 1 Ice pack (please put in freezer the night before surgery)   1 Hibiclens skin cleaner   Pre-op Instructions  If you have any questions regarding the instructions, do not hesitate to call our office.  [place]: [address] [place], [place] [address] [phone]  [place]: [address] [place], [place] [address] [phone]  [place]: [address]  [place], [place] [address] [phone]  Dr. Richard   Tuchman DPM, Dr. Norman Regal DPM Dr. Richard Sikora DPM, Dr. M. Todd Hyatt DPM, Dr. Kathryn Egerton DPM 

## 2014-11-29 NOTE — Telephone Encounter (Signed)
I attempted to call patient to see if he set up a surgery date.  "Call me on my other phone, reception is poor on this one."  I'm calling to see if you were given a surgery date.  "Dr. Paulla Dolly gave me the date he said it would be done on 08/23.  I've already contacted the surgery center and given them all my information."  Okay, thank you they didn't write your date down on the form.  "What time will it start?"  The surgical center will call you a day or 2 prior to and give you the arrival time.   Tentatively I'll give you the time of 8am arrival.

## 2014-11-29 NOTE — Progress Notes (Signed)
Subjective:     Patient ID: Manuel Landry, male   DOB: Nov 20, 1945, 69 y.o.   MRN: [id number]  HPI patient states I only had relief for a couple days in this area continues to bother me and making it very difficult for me to be ambulatory. The boot helps me temporarily but then the pain returns   Review of Systems     Objective:   Physical Exam Neurovascular status intact muscle strength adequate with patient having exquisite tenderness on the fibular sesamoid right with inflammation surrounding this area and minimal discomfort on the tibial sesamoid or the first metatarsal head itself    Assessment:     Appears to be damage to the fibular sesamoid leading to chronic inflammation of this area with possible fracture or arthritis of the fibular sesamoid    Plan:     Reviewed condition and discussed this condition and explained to him damage to the fibular sesamoid. At this point due to the chronic nature of this condition it would be best to go ahead and remove the fibular sesamoid and I explained the surgery and what would be required. Patient wants to have this removed understanding risk and the fact there is no long-term guarantees this will solve his problem. Patient at this time was given consent form which reviewed alternative treatments and complications signs consent form and is scheduled for outpatient surgery. Total recovery could be from 6 months to one year and he will wear his air fracture walker approximate 4 weeks after procedure

## 2014-12-06 DIAGNOSIS — M5430 Sciatica, unspecified side: Secondary | ICD-10-CM | POA: Diagnosis not present

## 2014-12-26 ENCOUNTER — Telehealth: Payer: Self-pay | Admitting: *Deleted

## 2014-12-26 NOTE — Telephone Encounter (Signed)
I called and left Caren Griffins a message that patient's surgery scheduled for 01/08/2015 for a Sesamoidectomy right foot has been authorized by [facility].  Authorization number is [id number].

## 2015-01-08 ENCOUNTER — Encounter: Payer: Self-pay | Admitting: *Deleted

## 2015-01-08 DIAGNOSIS — M84871 Other disorders of continuity of bone, right ankle and foot: Secondary | ICD-10-CM | POA: Diagnosis not present

## 2015-01-08 DIAGNOSIS — M25871 Other specified joint disorders, right ankle and foot: Secondary | ICD-10-CM | POA: Diagnosis not present

## 2015-01-08 DIAGNOSIS — N400 Enlarged prostate without lower urinary tract symptoms: Secondary | ICD-10-CM | POA: Diagnosis not present

## 2015-01-15 ENCOUNTER — Other Ambulatory Visit: Payer: Self-pay

## 2015-01-17 ENCOUNTER — Other Ambulatory Visit: Payer: Self-pay | Admitting: Podiatry

## 2015-01-17 ENCOUNTER — Ambulatory Visit (INDEPENDENT_AMBULATORY_CARE_PROVIDER_SITE_OTHER): Payer: Medicare Other

## 2015-01-17 ENCOUNTER — Ambulatory Visit (INDEPENDENT_AMBULATORY_CARE_PROVIDER_SITE_OTHER): Payer: Medicare Other | Admitting: Podiatry

## 2015-01-17 VITALS — BP 117/73 | HR 92 | Resp 16

## 2015-01-17 DIAGNOSIS — Z9889 Other specified postprocedural states: Secondary | ICD-10-CM

## 2015-01-17 DIAGNOSIS — D169 Benign neoplasm of bone and articular cartilage, unspecified: Secondary | ICD-10-CM

## 2015-01-18 NOTE — Progress Notes (Signed)
He presents today for a postop visit regarding a fibular sesamoidectomy right foot date of surgery 01/11/2015 performed by Dr. Paulla Dolly. He denies fever chills nausea vomiting muscle aches or pains he states this a little tender in the arch but other than that it hasn't been too bad. Denies chest pain and shortness of breath or calf pain.  Objective: Vital signs are stable alert and oriented 3 dry sterile dressing intact was removed demonstrates no erythema edema cellulitis drainage or odor sutures are intact to the plantar aspect of the foot margins appear to be well coapted nylon sutures are intact no purulence no malodor no excessive edema. Radiographs do demonstrate complete excision fibular sesamoid right. No signs of bone infection or soft tissue infection.  Assessment: Well-healing surgical foot status post one week right foot.  Plan: Redressed today dry sterile compressive dressing continue the use of his cam walker. Continue keeping the foot dry and clean and elevated as often as possible. Dr.Regal will follow-up with him in 1-2 weeks for suture removal.  Roselind Messier DPM

## 2015-01-22 ENCOUNTER — Telehealth: Payer: Self-pay | Admitting: *Deleted

## 2015-01-22 NOTE — Telephone Encounter (Addendum)
Pt states he as questions concerning his visit this coming Thursday.  Pt's wife Caren Griffins states pt can't come to the phone but wanted to know if the sutures would be removed, and what type shoe to bring to the Thursday appt.  I told her the sutures would be taken out if looked good, and to bring an athletic shoe but may leave in a surgical sandal.

## 2015-01-23 NOTE — Progress Notes (Signed)
Surgery performed at [facility], Sesamoidectomy Fibular right. Prescription was written for Vicodin 10/325 quantity 25.

## 2015-01-24 ENCOUNTER — Ambulatory Visit (INDEPENDENT_AMBULATORY_CARE_PROVIDER_SITE_OTHER): Payer: Medicare Other | Admitting: Podiatry

## 2015-01-24 ENCOUNTER — Encounter: Payer: Self-pay | Admitting: Podiatry

## 2015-01-24 VITALS — BP 126/86 | HR 89 | Resp 16

## 2015-01-24 DIAGNOSIS — Z9889 Other specified postprocedural states: Secondary | ICD-10-CM

## 2015-01-24 DIAGNOSIS — D169 Benign neoplasm of bone and articular cartilage, unspecified: Secondary | ICD-10-CM | POA: Diagnosis not present

## 2015-01-24 NOTE — Progress Notes (Signed)
Subjective:     Patient ID: Manuel Landry, male   DOB: 1946-05-16, 69 y.o.   MRN: [id number]  HPI patient presents stating the bottom of the right foot is doing very well and the incision site is intact with wound edges well coapted   Review of Systems     Objective:   Physical Exam Neurovascular status intact muscle strength adequate range of motion within normal limits with wound edges well coapted plantar aspect right first metatarsal were fibular sesamoid was removed    Assessment:     Doing well post fibular sesamoidectomy right    Plan:     Stitches removed wound edges remain coapted well and applied dressing. Gave instructions on continued elevation immobilization and gradual return to activity over the next 3 weeks and reappoint 4 weeks or earlier if any issues should occur

## 2015-01-28 ENCOUNTER — Telehealth: Payer: Self-pay | Admitting: *Deleted

## 2015-01-28 NOTE — Telephone Encounter (Addendum)
Pt asked how long is he to wear the compression sock and when to wear the ace?  Pt states may be away from the phone is okay to leave a message.  I left message explaining to pt to get use to wearing the compression sock, he should shower at night, leave off the compression sock and ace wrap, and in the morning before he swung his legs over the edge of the bed he should put on the compression sock and a covering if desired and the shoe gear he had been directed to wear.  If the compression sock became uncomfortable during the day he could remove it and apply the ace from the toes up his leg, because the tension could be varied more than the compression sock and it would help train the foot not to have surgery swelling and his end of day to day swelling.

## 2015-02-13 ENCOUNTER — Encounter: Payer: Self-pay | Admitting: Podiatry

## 2015-02-13 ENCOUNTER — Ambulatory Visit: Payer: Medicare Other

## 2015-02-13 ENCOUNTER — Ambulatory Visit (INDEPENDENT_AMBULATORY_CARE_PROVIDER_SITE_OTHER): Payer: Medicare Other | Admitting: Podiatry

## 2015-02-13 VITALS — BP 150/97 | HR 81 | Resp 16

## 2015-02-13 DIAGNOSIS — Z9889 Other specified postprocedural states: Secondary | ICD-10-CM

## 2015-02-13 DIAGNOSIS — M779 Enthesopathy, unspecified: Secondary | ICD-10-CM

## 2015-02-13 MED ORDER — TRIAMCINOLONE ACETONIDE 10 MG/ML IJ SUSP
10.0000 mg | Freq: Once | INTRAMUSCULAR | Status: AC
  Administered 2015-02-13: 10 mg

## 2015-02-13 NOTE — Progress Notes (Signed)
Subjective:     Patient ID: Manuel Landry, male   DOB: March 14, 1946, 69 y.o.   MRN: [id number]  HPI patient states I'm doing really well with the procedure that was done but I have irritation with fluid buildup around my fifth metatarsal head right that is sore. It is not currently draining but it has been tender with fluid buildup and it's hard to wear shoes   Review of Systems     Objective:   Physical Exam Neurovascular status intact with excellent healing of incision site plantar fibular sesamoid with fluid buildup around the fifth metatarsal head right that may have occurred as he walk differently secondary to the surgery on the medial side of the foot    Assessment:     Inflammatory capsulitis fifth MPJ right    Plan:     Injected the capsule to milligrams [place] some Kenalog advised on soaks and padded the area and instructed if any breakdown of skin or any issues were to occur to let us know immediately but at this time there is no indication of infection. Patient will be seen back for [place] to recheck again regular visit 10 days or earlier if any changes should occur

## 2015-02-22 ENCOUNTER — Ambulatory Visit (INDEPENDENT_AMBULATORY_CARE_PROVIDER_SITE_OTHER): Payer: Medicare Other | Admitting: Podiatry

## 2015-02-22 ENCOUNTER — Ambulatory Visit (INDEPENDENT_AMBULATORY_CARE_PROVIDER_SITE_OTHER): Payer: Medicare Other

## 2015-02-22 DIAGNOSIS — Z9889 Other specified postprocedural states: Secondary | ICD-10-CM | POA: Diagnosis not present

## 2015-02-22 DIAGNOSIS — D169 Benign neoplasm of bone and articular cartilage, unspecified: Secondary | ICD-10-CM

## 2015-02-23 NOTE — Progress Notes (Signed)
Subjective:     Patient ID: Manuel Landry, male   DOB: 03-21-1946, 69 y.o.   MRN: [id number]  HPI patient presents stating I'm doing well and the outside of my foot feels quite a bit better   Review of Systems     Objective:   Physical Exam Neurovascular status intact with improvement of the plantar foot right secondary to fibular sesamoidectomy with improvement around the fifth MPJ right where she he probably walk differently    Assessment:     Doing well with inflammatory capsulitis and well-healing surgical site plantar right    Plan:     Advised on return to normal activities and utilization of supportive shoe gear. Reappoint if symptoms were to cause any trouble

## 2015-03-08 DIAGNOSIS — Z23 Encounter for immunization: Secondary | ICD-10-CM | POA: Diagnosis not present

## 2015-06-03 DIAGNOSIS — N401 Enlarged prostate with lower urinary tract symptoms: Secondary | ICD-10-CM | POA: Diagnosis not present

## 2015-06-03 DIAGNOSIS — N5201 Erectile dysfunction due to arterial insufficiency: Secondary | ICD-10-CM | POA: Diagnosis not present

## 2015-06-03 DIAGNOSIS — Z0000 Encounter for general adult medical examination without abnormal findings: Secondary | ICD-10-CM | POA: Diagnosis not present

## 2015-06-03 DIAGNOSIS — R351 Nocturia: Secondary | ICD-10-CM | POA: Diagnosis not present

## 2015-06-03 DIAGNOSIS — N138 Other obstructive and reflux uropathy: Secondary | ICD-10-CM | POA: Diagnosis not present

## 2015-06-04 DIAGNOSIS — M1811 Unilateral primary osteoarthritis of first carpometacarpal joint, right hand: Secondary | ICD-10-CM | POA: Diagnosis not present

## 2015-07-11 DIAGNOSIS — M1811 Unilateral primary osteoarthritis of first carpometacarpal joint, right hand: Secondary | ICD-10-CM | POA: Diagnosis not present

## 2015-08-08 DIAGNOSIS — R05 Cough: Secondary | ICD-10-CM | POA: Diagnosis not present

## 2015-08-22 ENCOUNTER — Ambulatory Visit
Admission: RE | Admit: 2015-08-22 | Discharge: 2015-08-22 | Disposition: A | Discharge status: Home / Self Care | Payer: Medicare Other | Source: Ambulatory Visit | Attending: Family Medicine | Admitting: Family Medicine

## 2015-08-22 ENCOUNTER — Other Ambulatory Visit: Payer: Self-pay | Admitting: Family Medicine

## 2015-08-22 DIAGNOSIS — R05 Cough: Secondary | ICD-10-CM

## 2015-08-22 DIAGNOSIS — Z79899 Other long term (current) drug therapy: Secondary | ICD-10-CM | POA: Diagnosis not present

## 2015-08-22 DIAGNOSIS — R059 Cough, unspecified: Secondary | ICD-10-CM

## 2015-08-22 DIAGNOSIS — E7800 Pure hypercholesterolemia, unspecified: Secondary | ICD-10-CM | POA: Diagnosis not present

## 2015-08-22 DIAGNOSIS — Z0000 Encounter for general adult medical examination without abnormal findings: Secondary | ICD-10-CM | POA: Diagnosis not present

## 2015-09-17 DIAGNOSIS — L821 Other seborrheic keratosis: Secondary | ICD-10-CM | POA: Diagnosis not present

## 2015-09-17 DIAGNOSIS — D225 Melanocytic nevi of trunk: Secondary | ICD-10-CM | POA: Diagnosis not present

## 2015-09-17 DIAGNOSIS — L738 Other specified follicular disorders: Secondary | ICD-10-CM | POA: Diagnosis not present

## 2015-09-17 DIAGNOSIS — D1801 Hemangioma of skin and subcutaneous tissue: Secondary | ICD-10-CM | POA: Diagnosis not present

## 2015-11-28 DIAGNOSIS — H40013 Open angle with borderline findings, low risk, bilateral: Secondary | ICD-10-CM | POA: Diagnosis not present

## 2015-11-28 DIAGNOSIS — H524 Presbyopia: Secondary | ICD-10-CM | POA: Diagnosis not present

## 2015-11-28 DIAGNOSIS — H52223 Regular astigmatism, bilateral: Secondary | ICD-10-CM | POA: Diagnosis not present

## 2015-11-28 DIAGNOSIS — H5213 Myopia, bilateral: Secondary | ICD-10-CM | POA: Diagnosis not present

## 2015-11-28 DIAGNOSIS — H2513 Age-related nuclear cataract, bilateral: Secondary | ICD-10-CM | POA: Diagnosis not present

## 2015-12-23 DIAGNOSIS — H6983 Other specified disorders of Eustachian tube, bilateral: Secondary | ICD-10-CM | POA: Diagnosis not present

## 2016-01-06 DIAGNOSIS — D213 Benign neoplasm of connective and other soft tissue of thorax: Secondary | ICD-10-CM | POA: Diagnosis not present

## 2016-01-06 DIAGNOSIS — L570 Actinic keratosis: Secondary | ICD-10-CM | POA: Diagnosis not present

## 2016-01-06 DIAGNOSIS — D3617 Benign neoplasm of peripheral nerves and autonomic nervous system of trunk, unspecified: Secondary | ICD-10-CM | POA: Diagnosis not present

## 2016-01-06 DIAGNOSIS — L720 Epidermal cyst: Secondary | ICD-10-CM | POA: Diagnosis not present

## 2016-01-06 DIAGNOSIS — D1801 Hemangioma of skin and subcutaneous tissue: Secondary | ICD-10-CM | POA: Diagnosis not present

## 2016-01-07 DIAGNOSIS — H938X2 Other specified disorders of left ear: Secondary | ICD-10-CM | POA: Diagnosis not present

## 2016-01-07 DIAGNOSIS — J302 Other seasonal allergic rhinitis: Secondary | ICD-10-CM | POA: Diagnosis not present

## 2016-01-07 DIAGNOSIS — H9313 Tinnitus, bilateral: Secondary | ICD-10-CM | POA: Diagnosis not present

## 2016-02-25 DIAGNOSIS — Z23 Encounter for immunization: Secondary | ICD-10-CM | POA: Diagnosis not present

## 2016-05-26 DIAGNOSIS — R05 Cough: Secondary | ICD-10-CM | POA: Diagnosis not present

## 2016-06-01 DIAGNOSIS — R351 Nocturia: Secondary | ICD-10-CM | POA: Diagnosis not present

## 2016-06-01 DIAGNOSIS — N401 Enlarged prostate with lower urinary tract symptoms: Secondary | ICD-10-CM | POA: Diagnosis not present

## 2016-06-01 DIAGNOSIS — N5201 Erectile dysfunction due to arterial insufficiency: Secondary | ICD-10-CM | POA: Diagnosis not present

## 2016-06-08 DIAGNOSIS — R05 Cough: Secondary | ICD-10-CM | POA: Diagnosis not present

## 2016-07-28 ENCOUNTER — Encounter: Payer: Self-pay | Admitting: *Deleted

## 2016-08-13 ENCOUNTER — Encounter: Payer: Self-pay | Admitting: Internal Medicine

## 2016-09-03 ENCOUNTER — Encounter: Payer: Self-pay | Admitting: Internal Medicine

## 2016-09-23 DIAGNOSIS — D1801 Hemangioma of skin and subcutaneous tissue: Secondary | ICD-10-CM | POA: Diagnosis not present

## 2016-09-23 DIAGNOSIS — L821 Other seborrheic keratosis: Secondary | ICD-10-CM | POA: Diagnosis not present

## 2016-09-23 DIAGNOSIS — D485 Neoplasm of uncertain behavior of skin: Secondary | ICD-10-CM | POA: Diagnosis not present

## 2016-10-07 ENCOUNTER — Ambulatory Visit (AMBULATORY_SURGERY_CENTER): Payer: Self-pay

## 2016-10-07 VITALS — Ht 71.0 in | Wt 171.0 lb

## 2016-10-07 DIAGNOSIS — Z86010 Personal history of colonic polyps: Secondary | ICD-10-CM

## 2016-10-07 MED ORDER — NA SULFATE-K SULFATE-MG SULF 17.5-3.13-1.6 GM/177ML PO SOLN: 1.0000 | Freq: Once | ORAL | 0 refills | Status: AC

## 2016-10-07 NOTE — Progress Notes (Signed)
Denies allergies to eggs or soy products. Denies complication of anesthesia or sedation. Denies use of weight loss medication. Denies use of O2.   Emmi instructions given for colonoscopy.  

## 2016-10-09 ENCOUNTER — Telehealth: Payer: Self-pay | Admitting: Internal Medicine

## 2016-10-09 NOTE — Telephone Encounter (Signed)
1112 am called pt back with no answer. LM to return call if he can or we will try him later today. Lelan Pons PV

## 2016-10-09 NOTE — Telephone Encounter (Signed)
Pt informed he needs the 119 gram bottle in a 32 ounce bottle gatorade. He verbalized understanding  Lelan Pons PV

## 2016-10-21 ENCOUNTER — Encounter: Payer: Self-pay | Admitting: Internal Medicine

## 2016-10-21 ENCOUNTER — Ambulatory Visit (AMBULATORY_SURGERY_CENTER): Payer: Medicare Other | Admitting: Internal Medicine

## 2016-10-21 VITALS — BP 113/81 | HR 92 | Temp 97.1°F | Resp 11 | Ht 71.0 in | Wt 171.0 lb

## 2016-10-21 DIAGNOSIS — Z86010 Personal history of colonic polyps: Secondary | ICD-10-CM

## 2016-10-21 DIAGNOSIS — D122 Benign neoplasm of ascending colon: Secondary | ICD-10-CM | POA: Diagnosis not present

## 2016-10-21 DIAGNOSIS — Z85038 Personal history of other malignant neoplasm of large intestine: Secondary | ICD-10-CM | POA: Diagnosis not present

## 2016-10-21 DIAGNOSIS — D123 Benign neoplasm of transverse colon: Secondary | ICD-10-CM

## 2016-10-21 MED ORDER — SODIUM CHLORIDE 0.9 % IV SOLN: 500.0000 mL | INTRAVENOUS | Status: DC

## 2016-10-21 NOTE — Progress Notes (Signed)
Pt's states no medical or surgical changes since previsit or office visit. 

## 2016-10-21 NOTE — Patient Instructions (Signed)
YOU HAD AN ENDOSCOPIC PROCEDURE TODAY AT THE [facility]:   Refer to the procedure report that was given to you for any specific questions about what was found during the examination.  If the procedure report does not answer your questions, please call your gastroenterologist to clarify.  If you requested that your care partner not be given the details of your procedure findings, then the procedure report has been included in a sealed envelope for you to review at your convenience later.  YOU SHOULD EXPECT: Some feelings of bloating in the abdomen. Passage of more gas than usual.  Walking can help get rid of the air that was put into your GI tract during the procedure and reduce the bloating. If you had a lower endoscopy (such as a colonoscopy or flexible sigmoidoscopy) you may notice spotting of blood in your stool or on the toilet paper. If you underwent a bowel prep for your procedure, you may not have a normal bowel movement for a few days.  Please Note:  You might notice some irritation and congestion in your nose or some drainage.  This is from the oxygen used during your procedure.  There is no need for concern and it should clear up in a day or so.  SYMPTOMS TO REPORT IMMEDIATELY:   Following lower endoscopy (colonoscopy or flexible sigmoidoscopy):  Excessive amounts of blood in the stool  Significant tenderness or worsening of abdominal pains  Swelling of the abdomen that is new, acute  Fever of 100F or higher   For urgent or emergent issues, a gastroenterologist can be reached at any hour by calling [phone].   DIET:  We do recommend a small meal at first, but then you may proceed to your regular diet.  Drink plenty of fluids but you should avoid alcoholic beverages for 24 hours.  ACTIVITY:  You should plan to take it easy for the rest of today and you should NOT DRIVE or use heavy machinery until tomorrow (because of the sedation medicines used during the test).     FOLLOW UP: Our staff will call the number listed on your records the next business day following your procedure to check on you and address any questions or concerns that you may have regarding the information given to you following your procedure. If we do not reach you, we will leave a message.  However, if you are feeling well and you are not experiencing any problems, there is no need to return our call.  We will assume that you have returned to your regular daily activities without incident.  If any biopsies were taken you will be contacted by phone or by letter within the next 1-3 weeks.  Please call us at [phone] if you have not heard about the biopsies in 3 weeks.    SIGNATURES/CONFIDENTIALITY: You and/or your care partner have signed paperwork which will be entered into your electronic medical record.  These signatures attest to the fact that that the information above on your After Visit Summary has been reviewed and is understood.  Full responsibility of the confidentiality of this discharge information lies with you and/or your care-partner.   Resume medications. Information given on polyps,diverticulosis and hemorrhoids. 

## 2016-10-21 NOTE — Progress Notes (Signed)
Report to PACU, RN, vss, BBS= Clear.  

## 2016-10-21 NOTE — Op Note (Signed)
[facility] Patient Name: Manuel Landry Procedure Date: 10/21/2016 1:49 PM MRN: [id number] Endoscopist: Jerene Bears , MD Age: 71 Referring MD:  Date of Birth: 1946/05/11 Gender: Male Account #: [id number] Procedure:                Colonoscopy Indications:              Surveillance: Personal history of adenomatous                            polyps on last colonoscopy 3 years ago Medicines:                Monitored Anesthesia Care Procedure:                Pre-Anesthesia Assessment:                           - Prior to the procedure, a History and Physical                            was performed, and patient medications and                            allergies were reviewed. The patient's tolerance of                            previous anesthesia was also reviewed. The risks                            and benefits of the procedure and the sedation                            options and risks were discussed with the patient.                            All questions were answered, and informed consent                            was obtained. Prior Anticoagulants: The patient has                            taken no previous anticoagulant or antiplatelet                            agents. ASA Grade Assessment: II - A patient with                            mild systemic disease. After reviewing the risks                            and benefits, the patient was deemed in                            satisfactory condition to undergo the procedure.  After obtaining informed consent, the colonoscope                            was passed under direct vision. Throughout the                            procedure, the patient's blood pressure, pulse, and                            oxygen saturations were monitored continuously. The                            Model CF-HQ190L [id number]) scope was introduced                            through the anus and  advanced to the the cecum,                            identified by appendiceal orifice and ileocecal                            valve. The colonoscopy was performed without                            difficulty. The patient tolerated the procedure                            well. The quality of the bowel preparation was                            excellent (2 day MiraLax and SuPrep). The ileocecal                            valve, appendiceal orifice, and rectum were                            photographed. Scope In: 1:50:50 PM Scope Out: 2:05:40 PM Scope Withdrawal Time: 0 hours 11 minutes 51 seconds  Total Procedure Duration: 0 hours 14 minutes 50 seconds  Findings:                 The perianal and digital rectal examinations were                            normal.                           Three sessile polyps were found in the ascending                            colon. The polyps were 3 to 5 mm in size. These                            polyps were removed with a cold snare. Resection  and retrieval were complete.                           Two sessile polyps were found in the transverse                            colon. The polyps were 3 to 4 mm in size. These                            polyps were removed with a cold snare. Resection                            and retrieval were complete.                           Multiple small and large-mouthed diverticula were                            found in the sigmoid colon, descending colon and                            transverse colon.                           Internal hemorrhoids were found during                            retroflexion. The hemorrhoids were medium-sized. Complications:            No immediate complications. Estimated Blood Loss:     Estimated blood loss was minimal. Impression:               - Three 3 to 5 mm polyps in the ascending colon,                            removed with a cold  snare. Resected and retrieved.                           - Two 3 to 4 mm polyps in the transverse colon,                            removed with a cold snare. Resected and retrieved.                           - Moderate diverticulosis in the sigmoid colon, in                            the descending colon and in the transverse colon.                           - Internal hemorrhoids. Recommendation:           - Patient has a contact number available for  emergencies. The signs and symptoms of potential                            delayed complications were discussed with the                            patient. Return to normal activities tomorrow.                            Written discharge instructions were provided to the                            patient.                           - Resume previous diet.                           - Continue present medications.                           - Await pathology results.                           - Repeat colonoscopy is recommended. The                            colonoscopy date will be determined after pathology                            results from today's exam become available for                            review. Jerene Bears, MD 10/21/2016 2:10:08 PM This report has been signed electronically.

## 2016-10-21 NOTE — Progress Notes (Signed)
Called to room to assist during endoscopic procedure.  Patient ID and intended procedure confirmed with present staff. Received instructions for my participation in the procedure from the performing physician.  

## 2016-10-22 ENCOUNTER — Telehealth: Payer: Self-pay

## 2016-10-22 NOTE — Telephone Encounter (Signed)
  Follow up Call-  Call back number 10/21/2016  Post procedure Call Back phone  # [phone]  Permission to leave phone message Yes  Some recent data might be hidden     Patient questions:  Do you have a fever, pain , or abdominal swelling? No. Pain Score  0 *  Have you tolerated food without any problems? Yes.    Have you been able to return to your normal activities? Yes.    Do you have any questions about your discharge instructions: Diet   No. Medications  No. Follow up visit  No.  Do you have questions or concerns about your Care? No.  Actions: * If pain score is 4 or above: No action needed, pain <4.

## 2016-10-26 ENCOUNTER — Encounter: Payer: Self-pay | Admitting: Family Medicine

## 2016-10-26 ENCOUNTER — Ambulatory Visit (INDEPENDENT_AMBULATORY_CARE_PROVIDER_SITE_OTHER): Payer: Medicare Other | Admitting: Family Medicine

## 2016-10-26 VITALS — BP 110/72 | HR 87 | Temp 98.2°F | Ht 71.0 in | Wt 168.0 lb

## 2016-10-26 DIAGNOSIS — M7989 Other specified soft tissue disorders: Secondary | ICD-10-CM | POA: Diagnosis not present

## 2016-10-26 NOTE — Patient Instructions (Signed)
Let us know if you need anything.  

## 2016-10-26 NOTE — Progress Notes (Signed)
Chief Complaint  Patient presents with  . Establish Care    pt want to discuss pain and swelling in (R) hand-(off and on)       New Patient Visit SUBJECTIVE: HPI: Manuel Landry is an 72 y.o.male who is being seen for establishing care.  Used to be seen a [employer], this location is closer to his new abode.  Feels healthy overall, he was working outside last week and using his hands a lot. He had some swelling and decreased ROM. No pain currently. He denies any numbness, tingling, weakness, redness or warmth, no fevers or known injury.    No Known Allergies  Past Medical History:  Diagnosis Date  . Adenomatous colon polyp   . Allergy   . Anxiety   . Arthritis   . Backache, unspecified   . Barrett's esophagus    NOT A PROBLEM AT THE CURRENT TIME  . Blind loop syndrome   . BPH (benign prostatic hypertrophy)    DR. DAHLSTEDT  . Bulging discs    LOWER BACK  . Cataract   . Diverticulosis   . Family history of malignant neoplasm of gastrointestinal tract   . Glaucoma   . Hyperlipidemia   . IBS (irritable bowel syndrome)   . Stenosis of rectum and anus   . Unspecified gastritis and gastroduodenitis without mention of hemorrhage   . Vertigo    Past Surgical History:  Procedure Laterality Date  . CHOLECYSTECTOMY    . COLONOSCOPY    . INGUINAL HERNIA REPAIR     x 2  . MANDIBLE SURGERY    . right foot surgery    . ROTATOR CUFF REPAIR Right   . SHOULDER OPEN ROTATOR CUFF REPAIR  03/17/2012   Procedure: ROTATOR CUFF REPAIR SHOULDER OPEN;  Surgeon: Johnn Hai, MD;  Location: [facility];  Service: Orthopedics;  Laterality: Left;  LEFT MINI OPEN ROTATOR CUFF REPAIR   . TONSILLECTOMY    . VASECTOMY     Social History   Social History  . Marital status: Married   Occupational History  . Retired Retired   Social History Main Topics  . Smoking status: Never Smoker  . Smokeless tobacco: Never Used  . Alcohol use 3.5 oz/week    7 drink(s) per week     Comment:  1 day  . Drug use: No   Family History  Problem Relation Age of Onset  . Colon cancer Mother 31  . Diabetes Maternal Grandmother   . COPD Sister   . Heart failure Father 27  . Esophageal cancer Neg Hx   . Rectal cancer Neg Hx   . Stomach cancer Neg Hx      Current Outpatient Prescriptions:  .  Apoaequorin (PREVAGEN) 10 MG CAPS, Take 1 capsule by mouth daily., Disp: , Rfl:  .  azelastine (ASTELIN) 137 MCG/SPRAY nasal spray, Place 1 spray into the nose. Pt states his spray is 0.1% and he only uses nasal spray if needed., Disp: , Rfl:  .  cetirizine (ZYRTEC) 10 MG chewable tablet, Chew 10 mg by mouth as needed., Disp: , Rfl:  .  cyclobenzaprine (FLEXERIL) 10 MG tablet, Take 1 tablet (10 mg total) by mouth 3 (three) times daily as needed for muscle spasms., Disp: 60 tablet, Rfl: 0 .  diclofenac sodium (VOLTAREN) 1 % GEL, Apply 1 g topically 2 (two) times daily. To painful areas on shoulders, Disp: , Rfl:  .  finasteride (PROSCAR) 5 MG tablet, Take 5 mg by  mouth daily., Disp: , Rfl:  .  Glucosamine-Chondroitin (MOVE FREE PO), Take 1 tablet by mouth daily., Disp: , Rfl:  .  ibuprofen (ADVIL,MOTRIN) 800 MG tablet, Take 800 mg by mouth every 8 (eight) hours as needed. For pain, Disp: , Rfl:  .  Krill Oil 300 MG CAPS, Take by mouth., Disp: , Rfl:  .  Lactobacillus (DIGESTIVE HEALTH PROBIOTIC PO), Take 1 capsule by mouth daily., Disp: , Rfl:  .  magnesium oxide (MAG-OX) 400 MG tablet, Take 400 mg by mouth as needed., Disp: , Rfl:  .  Multiple Vitamins-Minerals (OCUVITE ADULT 50+) CAPS, Take 1 capsule by mouth daily., Disp: , Rfl:  .  tadalafil (CIALIS) 5 MG tablet, Take 5 mg by mouth daily as needed for erectile dysfunction., Disp: , Rfl:    ROS MSK: As noted in HPI  Neuro:  Denies numbness or tingling   OBJECTIVE: BP 110/72 (BP Location: Left Arm, Patient Position: Sitting, Cuff Size: Normal)   Pulse 87   Temp 98.2 F (36.8 C) (Oral)   Ht 5\' 11"  (1.803 m)   Wt 168 lb (76.2 kg)   SpO2  98%   BMI 23.43 kg/m   Constitutional: -  VS reviewed -  Well developed, well nourished, appears stated age -  No apparent distress  Psychiatric: -  Oriented to person, place, and time -  Memory intact -  Affect and mood normal -  Fluent conversation, good eye contact -  Judgment and insight age appropriate  Eye: -  Conjunctivae clear, no discharge -  Pupils symmetric, round, reactive to light  ENMT: -  Oral mucosa without lesions, tongue and uvula midline    Tonsils not enlarged, no erythema, no exudate, trachea midline    Pharynx moist, no lesions, no erythema  Neck: -  No gross swelling, no palpable masses -  Thyroid midline, not enlarged, mobile, no palpable masses  Cardiovascular: -  RRR, no murmurs -  No LE edema  Respiratory: -  Normal respiratory effort, no accessory muscle use, no retraction -  Breath sounds equal, no wheezes, no ronchi, no crackles  Gastrointestinal: -  Bowel sounds normal -  No tenderness, no distention, no guarding, no masses  Neurological:  -  CN II - XII grossly intact -  Sensation intact to light touch, equal bilaterally  Musculoskeletal: -  Mild edema of dorsal R hand, no deformity, 5/5 grip strength, nml ROM -  No TTP over finger joints or MCPs -  No warmth or significant joint effusions/edema  Skin: -  No significant lesion on inspection -  Warm and dry to palpation   ASSESSMENT/PLAN: Swelling of both hands  Patient instructed to sign release of records form from his previous PCP. Above dx resolving. Ice, modify activity. Patient should return later this fall for a CPE. The patient voiced understanding and agreement to the plan.   [employer], DO 10/26/16  11:26 AM

## 2016-10-29 ENCOUNTER — Encounter: Payer: Self-pay | Admitting: Internal Medicine

## 2016-12-23 DIAGNOSIS — H2513 Age-related nuclear cataract, bilateral: Secondary | ICD-10-CM | POA: Diagnosis not present

## 2016-12-23 DIAGNOSIS — H40013 Open angle with borderline findings, low risk, bilateral: Secondary | ICD-10-CM | POA: Diagnosis not present

## 2017-01-27 ENCOUNTER — Ambulatory Visit (INDEPENDENT_AMBULATORY_CARE_PROVIDER_SITE_OTHER): Payer: Medicare Other | Admitting: Family Medicine

## 2017-01-27 ENCOUNTER — Encounter: Payer: Self-pay | Admitting: Family Medicine

## 2017-01-27 VITALS — BP 118/80 | HR 79 | Temp 98.3°F | Ht 71.0 in | Wt 175.0 lb

## 2017-01-27 DIAGNOSIS — E785 Hyperlipidemia, unspecified: Secondary | ICD-10-CM | POA: Insufficient documentation

## 2017-01-27 DIAGNOSIS — Z0000 Encounter for general adult medical examination without abnormal findings: Secondary | ICD-10-CM | POA: Diagnosis not present

## 2017-01-27 DIAGNOSIS — Z23 Encounter for immunization: Secondary | ICD-10-CM

## 2017-01-27 LAB — LIPID PANEL
CHOLESTEROL: 234 mg/dL — AB (ref 0–200)
HDL: 93.8 mg/dL (ref >39.00)
LDL Cholesterol: 128 mg/dL — ABNORMAL HIGH (ref 0–99)
NonHDL: 140.43
TRIGLYCERIDES: 62 mg/dL (ref 0.0–149.0)
Total CHOL/HDL Ratio: 2
VLDL: 12.4 mg/dL (ref 0.0–40.0)

## 2017-01-27 NOTE — Patient Instructions (Addendum)
Keep up the great work.   Give [place] 2-3 business days to get the results of your labs back.   Bring copy of Advanced Directive to office so we can have a copy as well. This isn't urgent, but could be helpful in the future.   Let us know if you need anything.

## 2017-01-27 NOTE — Progress Notes (Signed)
Subjective:    Manuel Landry is a 71 y.o. male who presents for Medicare Annual/Subsequent preventive examination.   Preventive Screening-Counseling & Management  Tobacco History  Smoking Status  . Never Smoker  Smokeless Tobacco  . Never Used    Problems Prior to Visit 1. Chronic Osteoarthritis  2. Colon polyps 3. Fam hx of colon cancer  Current Problems (verified) Patient Active Problem List   Diagnosis Date Noted  . Hyperlipidemia 01/27/2017  . Cardiovascular risk factor 08/12/2011  . S/P cholecystectomy 06/22/2011  . Personal history of colonic polyps 06/22/2011  . CHOLELITHIASIS 02/13/2010  . FLATULENCE-GAS-BLOATING 02/13/2010  . CHOLELITHIASIS 02/11/2010  . GERD 02/10/2010  . DIVERTICULOSIS-COLON 02/10/2010  . NAUSEA ALONE 02/10/2010  . ABDOMINAL PAIN RIGHT UPPER QUADRANT 02/10/2010  . ABDOMINAL PAIN, EPIGASTRIC 02/10/2010  . ANEMIA-IRON DEFICIENCY 07/17/2008  . INGUINAL HERNIA 07/17/2008  . IRRITABLE BOWEL SYNDROME 07/17/2008  . GASTRITIS 07/11/2008  . DUODENITIS 07/11/2008  . RENAL CYST, RIGHT 07/11/2008  . DYSPEPSIA 06/12/2008  . CONSTIPATION 05/31/2008  . BACK PAIN, CHRONIC 05/31/2008  . BENIGN PROSTATIC HYPERTROPHY, MILD, HX OF 05/31/2008   Medications Prior to Visit Current Outpatient Prescriptions on File Prior to Visit  Medication Sig Dispense Refill  . Apoaequorin (PREVAGEN) 10 MG CAPS Take 1 capsule by mouth daily.    Marland Kitchen azelastine (ASTELIN) 137 MCG/SPRAY nasal spray Place 1 spray into the nose. Pt states his spray is 0.1% and he only uses nasal spray if needed.    . cetirizine (ZYRTEC) 10 MG chewable tablet Chew 10 mg by mouth as needed.    . cyclobenzaprine (FLEXERIL) 10 MG tablet Take 1 tablet (10 mg total) by mouth 3 (three) times daily as needed for muscle spasms. 60 tablet 0  . diclofenac sodium (VOLTAREN) 1 % GEL Apply 1 g topically 2 (two) times daily. To painful areas on shoulders    . finasteride (PROSCAR) 5 MG tablet Take 5 mg by  mouth daily.    . Glucosamine-Chondroitin (MOVE FREE PO) Take 1 tablet by mouth daily.    Marland Kitchen ibuprofen (ADVIL,MOTRIN) 800 MG tablet Take 800 mg by mouth every 8 (eight) hours as needed. For pain    . Krill Oil 300 MG CAPS Take by mouth.    . Lactobacillus (DIGESTIVE HEALTH PROBIOTIC PO) Take 1 capsule by mouth daily.    . magnesium oxide (MAG-OX) 400 MG tablet Take 400 mg by mouth as needed.    . Multiple Vitamins-Minerals (OCUVITE ADULT 50+) CAPS Take 1 capsule by mouth daily.    . tadalafil (CIALIS) 5 MG tablet Take 5 mg by mouth daily as needed for erectile dysfunction.     Current Medications (verified) Current Outpatient Prescriptions  Medication Sig Dispense Refill  . Apoaequorin (PREVAGEN) 10 MG CAPS Take 1 capsule by mouth daily.    Marland Kitchen azelastine (ASTELIN) 137 MCG/SPRAY nasal spray Place 1 spray into the nose. Pt states his spray is 0.1% and he only uses nasal spray if needed.    . cetirizine (ZYRTEC) 10 MG chewable tablet Chew 10 mg by mouth as needed.    . cyclobenzaprine (FLEXERIL) 10 MG tablet Take 1 tablet (10 mg total) by mouth 3 (three) times daily as needed for muscle spasms. 60 tablet 0  . diclofenac sodium (VOLTAREN) 1 % GEL Apply 1 g topically 2 (two) times daily. To painful areas on shoulders    . finasteride (PROSCAR) 5 MG tablet Take 5 mg by mouth daily.    . Glucosamine-Chondroitin (MOVE FREE PO) Take  1 tablet by mouth daily.    Marland Kitchen ibuprofen (ADVIL,MOTRIN) 800 MG tablet Take 800 mg by mouth every 8 (eight) hours as needed. For pain    . Krill Oil 300 MG CAPS Take by mouth.    . Lactobacillus (DIGESTIVE HEALTH PROBIOTIC PO) Take 1 capsule by mouth daily.    . magnesium oxide (MAG-OX) 400 MG tablet Take 400 mg by mouth as needed.    . Multiple Vitamins-Minerals (OCUVITE ADULT 50+) CAPS Take 1 capsule by mouth daily.    . tadalafil (CIALIS) 5 MG tablet Take 5 mg by mouth daily as needed for erectile dysfunction.     Allergies (verified) Patient has no known allergies.    PAST HISTORY  Family History Family History  Problem Relation Age of Onset  . Colon cancer Mother 39  . Diabetes Maternal Grandmother   . COPD Sister   . Heart failure Father 79  . Esophageal cancer Neg Hx   . Rectal cancer Neg Hx   . Stomach cancer Neg Hx     Social History Social History  Substance Use Topics  . Smoking status: Never Smoker  . Smokeless tobacco: Never Used  . Alcohol use 3.5 oz/week    7 drink(s) per week     Comment: 1 day    Are there smokers in your home (other than you)?  No  Risk Factors Current exercise habits: stretching, walking  Dietary issues discussed: Yes; doing well   Cardiac risk factors: advanced age (older than 68 for men, 63 for women) and male gender.  Depression Screen (Note: if answer to either of the following is "Yes", a more complete depression screening is indicated)   Q1: Over the past two weeks, have you felt down, depressed or hopeless? No  Q2: Over the past two weeks, have you felt little interest or pleasure in doing things? No  Have you lost interest or pleasure in daily life? No  Do you often feel hopeless? No  Do you cry easily over simple problems? No  Activities of Daily Living In your present state of health, do you have any difficulty performing the following activities?:  Driving? No Managing money?  No Feeding yourself? No Getting from bed to chair? No Climbing a flight of stairs? No Preparing food and eating?: No Bathing or showering? No Getting dressed: No Getting to the toilet? No Using the toilet:No Moving around from place to place: No In the past year have you fallen or had a near fall?: Yes- [place]  Are you sexually active?  No  Do you have more than one partner?  No  Hearing Difficulties: No Do you often ask people to speak up or repeat themselves? No Do you experience ringing or noises in your ears? Yes- has been to ENT Do you have difficulty understanding soft  or whispered voices? No   Do you feel that you have a problem with memory? Yes- will forget simple things like a name of an actor  Do you often misplace items? No  Do you feel safe at home?  Yes  Cognitive Testing  Alert? Yes  Normal Appearance?Yes  Oriented to person? Yes  Place? Yes   Time? Yes  Recall of three objects?  Yes  Can perform simple calculations? Yes  Displays appropriate judgment?Yes  Can read the correct time from a watch face?Yes   Advanced Directives have been discussed with the patient? Yes   List the Names of  Other Physician/Practitioners you currently use: 1.  Zenovia Jarred MD Gastroenterology 2.  Dr Darrold Span 3.  Dr Howell Rucks U[facility]   Indicate any recent Medical Services you may have received from other than [facility] providers in the past year (date may be approximate).  Immunization History  Administered Date(s) Administered  . Influenza, High Dose Seasonal PF 03/01/2012, 04/20/2013, 02/16/2014, 03/08/2015, 02/25/2016, 01/27/2017  . Influenza-Unspecified 03/01/2009, 03/11/2010, 03/10/2011  . Pneumococcal Conjugate-13 08/15/2013  . Pneumococcal Polysaccharide-23 08/05/2011  . Td 08/05/2003  . Tdap 09/07/2011  . Tetanus 10/27/2010  . Zoster 07/14/2010    Screening Tests Health Maintenance  Topic Date Due  . Hepatitis C Screening  [birth year]  . INFLUENZA VACCINE  12/16/2016  . COLONOSCOPY  10/22/2019  . TETANUS/TDAP  09/06/2021  . PNA vac Low Risk Adult  Completed    All answers were reviewed with the patient and necessary referrals were made:  [name], DO   01/27/2017   History reviewed: allergies, current medications, past family history, past medical history, past social history, past surgical history and problem list  Review of Systems Pertinent items are noted in HPI.    Objective:     Vision by Snellen chart: right eye:20/20, left eye:20/20 Blood pressure 118/80, pulse 79, temperature 98.3 F (36.8 C), temperature source  Oral, height 5\' 11"  (1.803 m), weight 175 lb (79.4 kg), SpO2 95 %. Body mass index is 24.41 kg/m.  BP 118/80   Pulse 79   Temp 98.3 F (36.8 C) (Oral)   Ht 5\' 11"  (1.803 m)   Wt 175 lb (79.4 kg)   SpO2 95%   BMI 24.41 kg/m   General Appearance:    Alert, cooperative, no distress, appears stated age  Head:    Normocephalic, without obvious abnormality, atraumatic  Eyes:    PERRL, conjunctiva/corneas clear, EOM's intact, fundi    benign, both eyes       Ears:    Normal TM's and external ear canals, both ears  Nose:   Nares normal, septum midline, mucosa normal, no drainage    or sinus tenderness  Throat:   Lips, mucosa, and tongue normal; teeth and gums normal  Neck:   Supple, symmetrical, trachea midline, no adenopathy;       thyroid:  No enlargement/tenderness/nodules; no carotid   bruit or JVD  Back:     Symmetric, no curvature, ROM normal, no CVA tenderness  Lungs:     Clear to auscultation bilaterally, respirations unlabored  Chest wall:    No tenderness or deformity  Heart:    Regular rate and rhythm, S1 and S2 normal, no murmur, rub   or gallop  Abdomen:     Soft, non-tender, bowel sounds active all four quadrants,    no masses, no organomegaly  Genitalia:    Normal male without lesion, discharge or tenderness  Rectal:    Normal tone, normal prostate, no masses or tenderness;   guaiac negative stool  Extremities:   Extremities normal, atraumatic, no cyanosis or edema  Pulses:   2+ and symmetric all extremities  Skin:   Skin color, texture, turgor normal, no rashes or lesions  Lymph nodes:   Cervical, supraclavicular, and axillary nodes normal  Neurologic:   CNII-XII intact. Normal strength, sensation and reflexes      throughout       Assessment:   Medicare annual wellness visit, subsequent  Need for immunization against influenza - Plan: Flu vaccine HIGH DOSE PF (Fluzone High dose)  Hyperlipidemia, unspecified  hyperlipidemia type - Plan: Lipid panel    Plan:      During the course of the visit the patient was educated and counseled about appropriate screening and preventive services including:    Influenza vaccine  Bring copy of advanced directive/living will to our office at earliest convenience; [facility] as well  Diet review for nutrition referral?  Not Indicated    Patient Instructions (the written plan) was given to the patient.  Medicare Attestation I have personally reviewed: The patient's medical and social history Their use of alcohol, tobacco or illicit drugs Their current medications and supplements The patient's functional ability including ADLs,fall risks, home safety risks, cognitive, and hearing and visual impairment Diet and physical activities Evidence for depression or mood disorders  The patient's weight, height, BMI, and visual acuity have been recorded in the chart.  I have made referrals, counseling, and provided education to the patient based on review of the above and I have provided the patient with a written personalized care plan for preventive services.     [place], DO   01/27/2017

## 2017-01-28 ENCOUNTER — Encounter: Payer: Medicare Other | Admitting: Family Medicine

## 2017-01-29 ENCOUNTER — Telehealth: Payer: Self-pay | Admitting: Family Medicine

## 2017-01-29 DIAGNOSIS — M7989 Other specified soft tissue disorders: Secondary | ICD-10-CM

## 2017-01-29 DIAGNOSIS — Z0189 Encounter for other specified special examinations: Secondary | ICD-10-CM

## 2017-01-29 NOTE — Telephone Encounter (Signed)
Relation to [facility] Call back number:[phone]   Reason for call:  Patient requesting full blood panel due to most recent AWV, please advise when orders are placed

## 2017-01-29 NOTE — Telephone Encounter (Signed)
OK. Medicare won't pay for a screening CBC and we don't have a good reason for it. We can order it if he is willing to pay out of pocket should insurance refuse to cover it. TY.

## 2017-02-01 NOTE — Telephone Encounter (Signed)
Spoke to the patient explained PCP instructions. He is ok paying out of pocket if necessary. Once ordered I can call him back to schedule a lab appointment.

## 2017-02-02 NOTE — Telephone Encounter (Signed)
OK. I ordered a blood count (which he will have to pay for) and a metabolic panel (which he might have to pay for). I would prefer he fasts. TY.

## 2017-02-02 NOTE — Addendum Note (Signed)
Addended by: Ames Coupe on: 02/02/2017 01:36 PM   Modules accepted: Orders

## 2017-02-03 NOTE — Telephone Encounter (Signed)
Called left message to call back 

## 2017-02-03 NOTE — Telephone Encounter (Signed)
Spoke to the patient and schedule appt.

## 2017-02-04 ENCOUNTER — Other Ambulatory Visit (INDEPENDENT_AMBULATORY_CARE_PROVIDER_SITE_OTHER): Payer: Medicare Other

## 2017-02-04 DIAGNOSIS — Z0189 Encounter for other specified special examinations: Secondary | ICD-10-CM | POA: Diagnosis not present

## 2017-02-04 DIAGNOSIS — M7989 Other specified soft tissue disorders: Secondary | ICD-10-CM | POA: Diagnosis not present

## 2017-02-04 LAB — COMPREHENSIVE METABOLIC PANEL
ALK PHOS: 30 U/L — AB (ref 39–117)
ALT: 11 U/L (ref 0–53)
AST: 16 U/L (ref 0–37)
Albumin: 4.1 g/dL (ref 3.5–5.2)
BILIRUBIN TOTAL: 0.5 mg/dL (ref 0.2–1.2)
BUN: 17 mg/dL (ref 6–23)
CO2: 29 mEq/L (ref 19–32)
CREATININE: 1.08 mg/dL (ref 0.40–1.50)
Calcium: 9.4 mg/dL (ref 8.4–10.5)
Chloride: 103 mEq/L (ref 96–112)
GFR: 71.54 mL/min (ref >60.00)
GLUCOSE: 94 mg/dL (ref 70–99)
Potassium: 4.4 mEq/L (ref 3.5–5.1)
SODIUM: 138 meq/L (ref 135–145)
TOTAL PROTEIN: 6.9 g/dL (ref 6.0–8.3)

## 2017-02-04 LAB — CBC
HCT: 44.2 % (ref 39.0–52.0)
Hemoglobin: 14.8 g/dL (ref 13.0–17.0)
MCHC: 33.5 g/dL (ref 30.0–36.0)
MCV: 91.3 fl (ref 78.0–100.0)
Platelets: 238 10*3/uL (ref 150.0–400.0)
RBC: 4.84 Mil/uL (ref 4.22–5.81)
RDW: 13.9 % (ref 11.5–15.5)
WBC: 5.3 10*3/uL (ref 4.0–10.5)

## 2017-03-10 DIAGNOSIS — R351 Nocturia: Secondary | ICD-10-CM | POA: Diagnosis not present

## 2017-03-10 DIAGNOSIS — N5201 Erectile dysfunction due to arterial insufficiency: Secondary | ICD-10-CM | POA: Diagnosis not present

## 2017-03-10 DIAGNOSIS — N401 Enlarged prostate with lower urinary tract symptoms: Secondary | ICD-10-CM | POA: Diagnosis not present

## 2017-03-31 ENCOUNTER — Ambulatory Visit (INDEPENDENT_AMBULATORY_CARE_PROVIDER_SITE_OTHER): Payer: Medicare Other | Admitting: Family Medicine

## 2017-03-31 ENCOUNTER — Encounter: Payer: Self-pay | Admitting: Family Medicine

## 2017-03-31 VITALS — Temp 98.6°F | Ht 71.0 in | Wt 180.1 lb

## 2017-03-31 DIAGNOSIS — S76011A Strain of muscle, fascia and tendon of right hip, initial encounter: Secondary | ICD-10-CM

## 2017-03-31 NOTE — Patient Instructions (Addendum)
Continue heat.   OK to take Tylenol 1000 mg (2 extra strength tabs) or 975 mg (3 regular strength tabs) every 6 hours as needed.  With you stretches, make sure to hold the stretches for 30 seconds.   Let me know if things are not getting better and we will set up physical therapy.

## 2017-03-31 NOTE — Progress Notes (Signed)
Pre visit review using our clinic review tool, if applicable. No additional management support is needed unless otherwise documented below in the visit note. 

## 2017-03-31 NOTE — Progress Notes (Signed)
Musculoskeletal Exam  Patient: Manuel Landry DOB: Sep 19, 1945  DOS: 03/31/2017  SUBJECTIVE:  Chief Complaint:   Chief Complaint  Patient presents with  . Back Pain    Manuel Landry is a 71 y.o.  male for evaluation and treatment of his back pain.   Onset:  2 weeks ago. He was working outside his [place] before this started.  Location: R lower Character:  tightening  Progression of issue:  has slightly improved Associated symptoms: none Bothers him when he gets up (hip extension) Denies bowel/bladder incontinence or weakness Treatment: to date has been OTC NSAIDS, muscle relaxers, home exercises and heat.   Neurovascular symptoms: no When this happens in the past, TENS unit treatment/PT has been helpful.  ROS: Gastrointestinal: no bowel incontinence Genitourinary: No bladder incontinence Musculoskeletal/Extremities: +back pain Neurologic: no numbness, tingling no weakness   Past Medical History:  Diagnosis Date  . Adenomatous colon polyp   . Allergy   . Anxiety   . Arthritis   . Backache, unspecified   . Barrett's esophagus    NOT A PROBLEM AT THE CURRENT TIME  . Blind loop syndrome   . BPH (benign prostatic hypertrophy)    DR. DAHLSTEDT  . Bulging discs    LOWER BACK  . Cataract   . Diverticulosis   . Family history of malignant neoplasm of gastrointestinal tract   . Glaucoma   . Hyperlipidemia   . IBS (irritable bowel syndrome)   . Stenosis of rectum and anus   . Unspecified gastritis and gastroduodenitis without mention of hemorrhage   . Vertigo    Objective:  VITAL SIGNS: Temp 98.6 F (37 C) (Oral)   Ht 5\' 11"  (1.803 m)   Wt 180 lb 2 oz (81.7 kg)   SpO2 97%   BMI 25.12 kg/m  Constitutional: Well formed, well developed. No acute distress. HENT: Normocephalic, atraumatic.  Moist mucous membranes.  Eyes:  EOM grossly intact. Conjunctiva normal.  Neck:  Full range of motion.   Extremities: No clubbing. No cyanosis. No edema.  Skin: Warm.  Dry. No erythema. No rash.  Musculoskeletal: low back.   Normal active range of motion: yes.   Normal passive range of motion: yes Tenderness to palpation: no, tightness noted by pt over R prox glute Deformity: no Ecchymosis: no Straight leg test: negative for Neurologic: Normal sensory function. No focal deficits noted. DTR's equal and symmetry in LE's. No clonus. Psychiatric: Normal mood. Age appropriate judgment and insight. Alert & oriented x 3.    Assessment:  Muscle strain of right gluteal region, initial encounter  Plan: Cont heat, stretches/exercises, tylenol. Call in 1-2 weeks if he fails to improve, we will set him up with PT.  F/u prn otherwise.  The patient voiced understanding and agreement to the plan.   [place], DO 03/31/17  3:20 PM

## 2017-04-01 DIAGNOSIS — D485 Neoplasm of uncertain behavior of skin: Secondary | ICD-10-CM | POA: Diagnosis not present

## 2017-04-01 DIAGNOSIS — L821 Other seborrheic keratosis: Secondary | ICD-10-CM | POA: Diagnosis not present

## 2017-04-02 ENCOUNTER — Other Ambulatory Visit: Payer: Self-pay | Admitting: Family Medicine

## 2017-04-02 ENCOUNTER — Encounter: Payer: Self-pay | Admitting: Family Medicine

## 2017-04-02 DIAGNOSIS — S76011A Strain of muscle, fascia and tendon of right hip, initial encounter: Secondary | ICD-10-CM

## 2017-04-02 NOTE — Progress Notes (Signed)
PT ordered, pt notified via [place].

## 2017-04-12 ENCOUNTER — Other Ambulatory Visit: Payer: Self-pay

## 2017-04-12 ENCOUNTER — Ambulatory Visit: Payer: Medicare Other | Attending: Family Medicine | Admitting: Physical Therapy

## 2017-04-12 DIAGNOSIS — G8929 Other chronic pain: Secondary | ICD-10-CM | POA: Insufficient documentation

## 2017-04-12 DIAGNOSIS — R293 Abnormal posture: Secondary | ICD-10-CM | POA: Insufficient documentation

## 2017-04-12 DIAGNOSIS — R29898 Other symptoms and signs involving the musculoskeletal system: Secondary | ICD-10-CM | POA: Diagnosis not present

## 2017-04-12 DIAGNOSIS — M545 Low back pain: Secondary | ICD-10-CM | POA: Diagnosis not present

## 2017-04-12 NOTE — Patient Instructions (Signed)
Hamstring Step 2   Left foot relaxed, knee straight, other leg bent, foot flat. Raise straight leg further upward to maximal range. Hold __30_ seconds. Relax leg completely down. Repeat _3__ times.  Bridging   Slowly raise buttocks from floor, keeping stomach tight. Repeat ___15_ times per set. Do __2__ sets per session.  **squeeze bottom, prior to lifting, hold 5 seconds**  Hip Abductors (Side Lying)   Lie on side with bottom leg bent at knee, __-__ pound weight on right leg. Lift up and back ___~8_ inches in air. Keep upper hip rolled forward and knee straight. Hold __3__ seconds. Repeat __15__ times. Do __2__ sessions per day. CAUTION: Move slowly.  Mini Squat: Double Leg   With feet shoulder width apart, reach forward for balance and do a mini squat. Keep knees in line with second toe. Knees do not go past toes. Repeat _15__ times per set. Do __2_ sets per session.  Trigger Point Dry Needling  . What is Trigger Point Dry Needling (DN)? o DN is a physical therapy technique used to treat muscle pain and dysfunction. Specifically, DN helps deactivate muscle trigger points (muscle knots).  o A thin filiform needle is used to penetrate the skin and stimulate the underlying trigger point. The goal is for a local twitch response (LTR) to occur and for the trigger point to relax. No medication of any kind is injected during the procedure.   . What Does Trigger Point Dry Needling Feel Like?  o The procedure feels different for each individual patient. Some patients report that they do not actually feel the needle enter the skin and overall the process is not painful. Very mild bleeding may occur. However, many patients feel a deep cramping in the muscle in which the needle was inserted. This is the local twitch response.   Marland Kitchen How Will I feel after the treatment? o Soreness is normal, and the onset of soreness may not occur for a few hours. Typically this soreness does not last longer  than two days.  o Bruising is uncommon, however; ice can be used to decrease any possible bruising.  o In rare cases feeling tired or nauseous after the treatment is normal. In addition, your symptoms may get worse before they get better, this period will typically not last longer than 24 hours.   . What Can I do After My Treatment? o Increase your hydration by drinking more water for the next 24 hours. o You may place ice or heat on the areas treated that have become sore, however, do not use heat on inflamed or bruised areas. Heat often brings more relief post needling. o You can continue your regular activities, but vigorous activity is not recommended initially after the treatment for 24 hours. o DN is best combined with other physical therapy such as strengthening, stretching, and other therapies.

## 2017-04-13 ENCOUNTER — Encounter: Payer: Self-pay | Admitting: Physical Therapy

## 2017-04-13 NOTE — Therapy (Signed)
[facility] [address]  [place], [place], [address] Phone: [phone]   Fax:  [phone]  Physical Therapy Evaluation  Patient Details  Name: Manuel Landry MRN: [id number] Date of Birth: [birth year] Referring Provider: Dr. Nani Ravens   Encounter Date: 04/12/2017  PT End of Session - 04/13/17 0804    Visit Number  1    Number of Visits  12    Date for PT Re-Evaluation  05/25/17    Authorization Type  Medicare    PT Start Time  1447    PT Stop Time  1545    PT Time Calculation (min)  58 min    Activity Tolerance  Patient tolerated treatment well    Behavior During Therapy  [facility] for tasks assessed/performed       Past Medical History:  Diagnosis Date  . Adenomatous colon polyp   . Allergy   . Anxiety   . Arthritis   . Backache, unspecified   . Barrett's esophagus    NOT A PROBLEM AT THE CURRENT TIME  . Blind loop syndrome   . BPH (benign prostatic hypertrophy)    DR. DAHLSTEDT  . Bulging discs    LOWER BACK  . Cataract   . Diverticulosis   . Family history of malignant neoplasm of gastrointestinal tract   . Glaucoma   . Hyperlipidemia   . IBS (irritable bowel syndrome)   . Stenosis of rectum and anus   . Unspecified gastritis and gastroduodenitis without mention of hemorrhage   . Vertigo     Past Surgical History:  Procedure Laterality Date  . CHOLECYSTECTOMY    . COLONOSCOPY    . INGUINAL HERNIA REPAIR     x 2  . MANDIBLE SURGERY    . right foot surgery    . ROTATOR CUFF REPAIR Right   . SHOULDER OPEN ROTATOR CUFF REPAIR  03/17/2012   Procedure: ROTATOR CUFF REPAIR SHOULDER OPEN;  Surgeon: Johnn Hai, MD;  Location: [facility];  Service: Orthopedics;  Laterality: Left;  LEFT MINI OPEN ROTATOR CUFF REPAIR   . TONSILLECTOMY    . VASECTOMY      There were no vitals filed for this visit.   Subjective Assessment - 04/12/17 1447    Subjective  Back injury in 1992; noted bulging disc.  Did PT with good results. MRI (10 years ago) of back - "4 bad discs." About a month ago - was doing heavy lifting - noted tightness in R glute region. Uses ice and heat - does morning stretches everyday. Felt well yesterday, but this morning was painful. Figure 4 position of R LE is difficult. Bending forward is difficult. Feels like commercial heat and stim work best.     How long can you sit comfortably?  <30 min    How long can you walk comfortably?  pain relief with walking    Diagnostic tests  none recently    Patient Stated Goals  improve pain    Currently in Pain?  Yes    Pain Score  3  9/10 this morning    Pain Location  Back and buttock    Pain Orientation  Right    Pain Descriptors / Indicators  Aching;Tightness;Discomfort    Pain Type  Chronic pain    Pain Radiating Towards  intermittent into R anterior thigh    Pain Onset  More than a month ago    Pain Frequency  Intermittent  Aggravating Factors   prolonged sitting    Pain Relieving Factors  hot shower, stretch         [facility] PT Assessment - 04/12/17 1457      Assessment   Medical Diagnosis  Muscle strain of R gluteal region    Referring Provider  Dr. Nani Ravens    Onset Date/Surgical Date  -- ~1 month ago    Next MD Visit  prn    Prior Therapy  yes      Precautions   Precautions  None      Restrictions   Weight Bearing Restrictions  No      Balance Screen   Has the patient fallen in the past 6 months  No    Has the patient had a decrease in activity level because of a fear of falling?   No    Is the patient reluctant to leave their home because of a fear of falling?   No      Home Film/video editor residence    Living Arrangements  Spouse/significant other    Type of [place] Access  Level entry    [place]  One level      Prior Function   Level of [place]  Retired      Associate Professor   Overall Cognitive Status   Within Functional Limits for tasks assessed      Observation/Other Assessments   Focus on Therapeutic Outcomes (FOTO)   Hip: 69 (31% limted, predicted 25% limited)      Sensation   Light Touch  Appears Intact      Coordination   Gross Motor Movements are Fluid and Coordinated  Yes      Posture/Postural Control   Posture/Postural Control  Postural limitations    Postural Limitations  Rounded Shoulders;Forward head      ROM / Strength   AROM / PROM / Strength  AROM;Strength      AROM   AROM Assessment Site  Lumbar    Lumbar Flexion  fingertip to mid-shins - slight increase in pain    Lumbar Extension  25% limited      Lumbar - Right Side Bend  fingertip to joint line - improvement in symptoms    Lumbar - Left Side Bend  fingertip to above joint line - slight pain    Lumbar - Right Rotation  25% limited - strained    Lumbar - Left Rotation  25% limited      Strength   Overall Strength Comments  L LE gross strength 4+/5; R LE gross strength 4/5      Flexibility   Soft Tissue Assessment /Muscle Length  yes    Hamstrings  B moderate tightness      Palpation   Spinal mobility  slight hypomobility of lumbar segments    Palpation comment  TTP along R lateral sacral border and into glute/piriformis region             Objective measurements completed on examination: See above findings.      [facility] Adult PT Treatment/Exercise - 04/12/17 1457      Exercises   Exercises  Lumbar      Lumbar Exercises: Stretches   Passive Hamstring Stretch  2 reps;30 seconds    Passive Hamstring Stretch Limitations  R - with strap; education on proper stretching technique    Single Knee to Chest  Stretch  1 rep;20 seconds    Single Knee to Chest Stretch Limitations  bilateral    Piriformis Stretch  1 rep;30 seconds    Piriformis Stretch Limitations  bilateral - KTOS; not tolerable to figure 4      Lumbar Exercises: Supine   Bridge  10 reps;5 seconds    Bridge Limitations  VC for slower  controlled motions      Lumbar Exercises: Sidelying   Hip Abduction  10 reps    Hip Abduction Limitations  R side      Modalities   Modalities  Electrical Stimulation;Moist Heat      Moist Heat Therapy   Number Minutes Moist Heat  15 Minutes    Moist Heat Location  Lumbar Spine      Electrical Stimulation   Electrical Stimulation Location  R sided lumbar    Electrical Stimulation Action  IFC    Electrical Stimulation Parameters  to tolerance    Electrical Stimulation Goals  Pain;Tone             PT Education - 04/13/17 0803    Education provided  Yes    Education Details  exam findings, POC, HEP    Person(s) Educated  Patient    Methods  Explanation;Demonstration;Handout    Comprehension  Verbalized understanding;Returned demonstration       PT Short Term Goals - 04/13/17 0839      PT SHORT TERM GOAL #1   Title  patient to be independent with HEP for gentle stretching and strengthening    Status  New    Target Date  05/04/17        PT Long Term Goals - 04/13/17 0909      PT LONG TERM GOAL #1   Title  patient to be independent with advanced HEP    Status  New    Target Date  05/25/17      PT LONG TERM GOAL #2   Title  patient to improve R hip strength to >/= 4+/5    Status  New    Target Date  05/25/17      PT LONG TERM GOAL #3   Title  patient to report pain reduction by >/= 75% for greater than 2 weeks    Status  New    Target Date  05/25/17      PT LONG TERM GOAL #4   Title  patient to demonstrate improved tissue quality with reduced TTP and increased flexibility    Status  New    Target Date  05/25/17             Plan - 04/13/17 0830    Clinical Impression Statement  Patient is a 71 y/o male presenting to [place] today regarding primary complaints of R sided low back and buttock pain, of which he reports initiated in 1992, but has had recent flare up approx. 1 month ago. Patient today with noted deficts with lumbar AROM into flexion, L side  bend and R rotation, proximal hip weakness, and reduced flexibility of HS group likely contributing to overall pain patterns. Patient also with TTP over R lateral sacrum and into glute/piriformis muscle group. Patient today given reivew of stretching that he is already performing at home as well as addition of gentle hip strengthening with good carryover. Patient to benefit from PT to address pain and functional mobility limitations to allow for improved QOL.     Clinical Presentation  Stable    Clinical Decision  Making  Low    Rehab Potential  Good    PT Frequency  2x / week    PT Duration  6 weeks    PT Treatment/Interventions  ADLs/Self Care Home Management;Cryotherapy;Electrical Stimulation;Iontophoresis 4mg /ml Dexamethasone;Moist Heat;Traction;Therapeutic exercise;Therapeutic activities;Functional mobility training;Neuromuscular re-education;Patient/family education;Manual techniques;Vasopneumatic Device;Taping;Dry needling;Passive range of motion    PT Next Visit Plan  possible DN    Consulted and Agree with Plan of Care  Patient       Patient will benefit from skilled therapeutic intervention in order to improve the following deficits and impairments:  Decreased activity tolerance, Decreased range of motion, Decreased mobility, Decreased strength, Pain  Visit Diagnosis: Chronic right-sided low back pain, with sciatica presence unspecified - Plan: PT plan of care cert/re-cert  Abnormal posture - Plan: PT plan of care cert/re-cert  Other symptoms and signs involving the musculoskeletal system - Plan: PT plan of care cert/re-cert  G-Codes - 35/70/17 0915    Functional Assessment Tool Used (Outpatient Only)  FOTO: 69 (31% limited)    Functional Limitation  Mobility: Walking and moving around    Mobility: Walking and Moving Around Current Status ([id number])  At least 20 percent but less than 40 percent impaired, limited or restricted    Mobility: Walking and Moving Around Goal Status [id number])   At least 20 percent but less than 40 percent impaired, limited or restricted        Problem List Patient Active Problem List   Diagnosis Date Noted  . Hyperlipidemia 01/27/2017  . Cardiovascular risk factor 08/12/2011  . S/P cholecystectomy 06/22/2011  . Personal history of colonic polyps 06/22/2011  . CHOLELITHIASIS 02/13/2010  . FLATULENCE-GAS-BLOATING 02/13/2010  . CHOLELITHIASIS 02/11/2010  . GERD 02/10/2010  . DIVERTICULOSIS-COLON 02/10/2010  . NAUSEA ALONE 02/10/2010  . ABDOMINAL PAIN RIGHT UPPER QUADRANT 02/10/2010  . ABDOMINAL PAIN, EPIGASTRIC 02/10/2010  . ANEMIA-IRON DEFICIENCY 07/17/2008  . INGUINAL HERNIA 07/17/2008  . IRRITABLE BOWEL SYNDROME 07/17/2008  . GASTRITIS 07/11/2008  . DUODENITIS 07/11/2008  . RENAL CYST, RIGHT 07/11/2008  . DYSPEPSIA 06/12/2008  . CONSTIPATION 05/31/2008  . BACK PAIN, CHRONIC 05/31/2008  . BENIGN PROSTATIC HYPERTROPHY, MILD, HX OF 05/31/2008     Lanney Gins, PT, DPT 04/13/17 9:22 AM   [facility] [place], [place], [address] Phone: [phone]   Fax:  [phone]  Name: Manuel Landry MRN: [id number] Date of Birth: July 19, 1945

## 2017-04-15 ENCOUNTER — Encounter: Payer: Self-pay | Admitting: Physical Therapy

## 2017-04-15 ENCOUNTER — Ambulatory Visit: Payer: Medicare Other | Admitting: Physical Therapy

## 2017-04-15 DIAGNOSIS — G8929 Other chronic pain: Secondary | ICD-10-CM

## 2017-04-15 DIAGNOSIS — R29898 Other symptoms and signs involving the musculoskeletal system: Secondary | ICD-10-CM | POA: Diagnosis not present

## 2017-04-15 DIAGNOSIS — R293 Abnormal posture: Secondary | ICD-10-CM | POA: Diagnosis not present

## 2017-04-15 DIAGNOSIS — M545 Low back pain: Principal | ICD-10-CM

## 2017-04-15 NOTE — Therapy (Signed)
[facility] [address]  [place], [place], [address] Phone: [phone]   Fax:  [phone]  Physical Therapy Treatment  Patient Details  Name: Manuel Landry MRN: [id number] Date of Birth: November 04, 1945 Referring Provider: Dr. Nani Ravens   Encounter Date: 04/15/2017  PT End of Session - 04/15/17 1627    Visit Number  2    Number of Visits  12    Date for PT Re-Evaluation  05/25/17    Authorization Type  Medicare    PT Start Time  1027    PT Stop Time  1632    PT Time Calculation (min)  61 min    Activity Tolerance  Patient tolerated treatment well    Behavior During Therapy  [facility] for tasks assessed/performed       Past Medical History:  Diagnosis Date  . Adenomatous colon polyp   . Allergy   . Anxiety   . Arthritis   . Backache, unspecified   . Barrett's esophagus    NOT A PROBLEM AT THE CURRENT TIME  . Blind loop syndrome   . BPH (benign prostatic hypertrophy)    DR. DAHLSTEDT  . Bulging discs    LOWER BACK  . Cataract   . Diverticulosis   . Family history of malignant neoplasm of gastrointestinal tract   . Glaucoma   . Hyperlipidemia   . IBS (irritable bowel syndrome)   . Stenosis of rectum and anus   . Unspecified gastritis and gastroduodenitis without mention of hemorrhage   . Vertigo     Past Surgical History:  Procedure Laterality Date  . CHOLECYSTECTOMY    . COLONOSCOPY    . INGUINAL HERNIA REPAIR     x 2  . MANDIBLE SURGERY    . right foot surgery    . ROTATOR CUFF REPAIR Right   . SHOULDER OPEN ROTATOR CUFF REPAIR  03/17/2012   Procedure: ROTATOR CUFF REPAIR SHOULDER OPEN;  Surgeon: Johnn Hai, MD;  Location: [facility];  Service: Orthopedics;  Laterality: Left;  LEFT MINI OPEN ROTATOR CUFF REPAIR   . TONSILLECTOMY    . VASECTOMY      There were no vitals filed for this visit.  Subjective Assessment - 04/15/17 1539    Subjective  doing well today - feels like back is easing  up    Patient Stated Goals  improve pain    Currently in Pain?  Yes    Pain Score  3     Pain Location  Back    Pain Orientation  Right;Lower    Pain Descriptors / Indicators  Aching;Sore    Pain Type  Chronic pain                      [facility] Adult PT Treatment/Exercise - 04/15/17 0001      Lumbar Exercises: Stretches   Passive Hamstring Stretch Limitations  standing hip hinge 10 x 15 sec      Lumbar Exercises: Seated   Long Arc Quad on [place]  Both;10 reps    Hip Flexion on [place]  Both;10 reps      Lumbar Exercises: Supine   Bridge  15 reps;3 seconds    Bridge Limitations  green tband at knees    Other Supine Lumbar Exercises  HS bridge x 15 reps      Lumbar Exercises: Sidelying   Clam  15 reps bilateral; green tband      Modalities  Modalities  Electrical Stimulation;Moist Heat      Moist Heat Therapy   Number Minutes Moist Heat  15 Minutes    Moist Heat Location  Lumbar Spine      Electrical Stimulation   Electrical Stimulation Location  R sided lumbar    Electrical Stimulation Action  IFC    Electrical Stimulation Parameters  to tolerance    Electrical Stimulation Goals  Pain;Tone               PT Short Term Goals - 04/13/17 0839      PT SHORT TERM GOAL #1   Title  patient to be independent with HEP for gentle stretching and strengthening    Status  New    Target Date  05/04/17        PT Long Term Goals - 04/13/17 0909      PT LONG TERM GOAL #1   Title  patient to be independent with advanced HEP    Status  New    Target Date  05/25/17      PT LONG TERM GOAL #2   Title  patient to improve R hip strength to >/= 4+/5    Status  New    Target Date  05/25/17      PT LONG TERM GOAL #3   Title  patient to report pain reduction by >/= 75% for greater than 2 weeks    Status  New    Target Date  05/25/17      PT LONG TERM GOAL #4   Title  patient to demonstrate improved tissue quality with reduced TTP and increased flexibility     Status  New    Target Date  05/25/17            Plan - 04/15/17 1627    Clinical Impression Statement  Patient doing well today - feels like back pain is starting to ease up and did find comfort in estim/heat at last session. Doing well today with all low back stretching and strengthening with no issue. Patient to continue to progress towards goals as tolerated.     PT Treatment/Interventions  ADLs/Self Care Home Management;Cryotherapy;Electrical Stimulation;Iontophoresis 4mg /ml Dexamethasone;Moist Heat;Traction;Therapeutic exercise;Therapeutic activities;Functional mobility training;Neuromuscular re-education;Patient/family education;Manual techniques;Vasopneumatic Device;Taping;Dry needling;Passive range of motion    Consulted and Agree with Plan of Care  Patient       Patient will benefit from skilled therapeutic intervention in order to improve the following deficits and impairments:  Decreased activity tolerance, Decreased range of motion, Decreased mobility, Decreased strength, Pain  Visit Diagnosis: Chronic right-sided low back pain, with sciatica presence unspecified  Abnormal posture  Other symptoms and signs involving the musculoskeletal system     Problem List Patient Active Problem List   Diagnosis Date Noted  . Hyperlipidemia 01/27/2017  . Cardiovascular risk factor 08/12/2011  . S/P cholecystectomy 06/22/2011  . Personal history of colonic polyps 06/22/2011  . CHOLELITHIASIS 02/13/2010  . FLATULENCE-GAS-BLOATING 02/13/2010  . CHOLELITHIASIS 02/11/2010  . GERD 02/10/2010  . DIVERTICULOSIS-COLON 02/10/2010  . NAUSEA ALONE 02/10/2010  . ABDOMINAL PAIN RIGHT UPPER QUADRANT 02/10/2010  . ABDOMINAL PAIN, EPIGASTRIC 02/10/2010  . ANEMIA-IRON DEFICIENCY 07/17/2008  . INGUINAL HERNIA 07/17/2008  . IRRITABLE BOWEL SYNDROME 07/17/2008  . GASTRITIS 07/11/2008  . DUODENITIS 07/11/2008  . RENAL CYST, RIGHT 07/11/2008  . DYSPEPSIA 06/12/2008  . CONSTIPATION  05/31/2008  . BACK PAIN, CHRONIC 05/31/2008  . BENIGN PROSTATIC HYPERTROPHY, MILD, HX OF 05/31/2008     Lanney Gins, PT, DPT  04/15/17 4:33 PM   [facility] [address]  [place], [place], [address] Phone: [phone]   Fax:  [phone]  Name: CRISTOPHER CICCARELLI MRN: [id number] Date of Birth: 11-04-1945

## 2017-04-21 ENCOUNTER — Ambulatory Visit: Payer: Medicare Other | Attending: Family Medicine

## 2017-04-21 DIAGNOSIS — R29898 Other symptoms and signs involving the musculoskeletal system: Secondary | ICD-10-CM | POA: Diagnosis not present

## 2017-04-21 DIAGNOSIS — R293 Abnormal posture: Secondary | ICD-10-CM | POA: Insufficient documentation

## 2017-04-21 DIAGNOSIS — M545 Low back pain: Secondary | ICD-10-CM | POA: Insufficient documentation

## 2017-04-21 DIAGNOSIS — G8929 Other chronic pain: Secondary | ICD-10-CM | POA: Insufficient documentation

## 2017-04-21 NOTE — Therapy (Signed)
[facility] [address]  [place], [place], [address] Phone: [phone]   Fax:  [phone]  Physical Therapy Treatment  Patient Details  Name: Manuel Landry MRN: [id number] Date of Birth: 1945/07/15 Referring Provider: Dr. Nani Ravens   Encounter Date: 04/21/2017  PT End of Session - 04/21/17 1410    Visit Number  3    Number of Visits  12    Date for PT Re-Evaluation  05/25/17    Authorization Type  Medicare    PT Start Time  8413    PT Stop Time  1500    PT Time Calculation (min)  57 min    Activity Tolerance  Patient tolerated treatment well    Behavior During Therapy  [facility] for tasks assessed/performed       Past Medical History:  Diagnosis Date  . Adenomatous colon polyp   . Allergy   . Anxiety   . Arthritis   . Backache, unspecified   . Barrett's esophagus    NOT A PROBLEM AT THE CURRENT TIME  . Blind loop syndrome   . BPH (benign prostatic hypertrophy)    DR. DAHLSTEDT  . Bulging discs    LOWER BACK  . Cataract   . Diverticulosis   . Family history of malignant neoplasm of gastrointestinal tract   . Glaucoma   . Hyperlipidemia   . IBS (irritable bowel syndrome)   . Stenosis of rectum and anus   . Unspecified gastritis and gastroduodenitis without mention of hemorrhage   . Vertigo     Past Surgical History:  Procedure Laterality Date  . CHOLECYSTECTOMY    . COLONOSCOPY    . INGUINAL HERNIA REPAIR     x 2  . MANDIBLE SURGERY    . right foot surgery    . ROTATOR CUFF REPAIR Right   . SHOULDER OPEN ROTATOR CUFF REPAIR  03/17/2012   Procedure: ROTATOR CUFF REPAIR SHOULDER OPEN;  Surgeon: Johnn Hai, MD;  Location: [facility];  Service: Orthopedics;  Laterality: Left;  LEFT MINI OPEN ROTATOR CUFF REPAIR   . TONSILLECTOMY    . VASECTOMY      There were no vitals filed for this visit.  Subjective Assessment - 04/21/17 1408    Subjective  [name] reporting he got good relief from heat  and E-stim last visit.  No new complaints today.      How long can you walk comfortably?  pain relief with walking    Patient Stated Goals  improve pain    Currently in Pain?  Yes    Pain Score  3     Pain Location  Back    Pain Orientation  Right;Lower    Pain Type  Chronic pain    Pain Onset  More than a month ago    Pain Frequency  Intermittent    Aggravating Factors   prolonged standing on hard surface,     Pain Relieving Factors  hot shower, stretch    Multiple Pain Sites  No                      [facility] Adult PT Treatment/Exercise - 04/21/17 1420      Lumbar Exercises: Aerobic   Stationary Bike  Lvl 2, 6 min       Lumbar Exercises: Supine   Bridge  15 reps;3 seconds    Bridge Limitations  Alternating hip abd/ER x 2 at top; green tband  at knees      Lumbar Exercises: Sidelying   Clam  15 reps    Clam Limitations  green TB at knees       Lumbar Exercises: Quadruped   Straight Leg Raise  10 reps;3 seconds    Straight Leg Raises Limitations  peanut p-ball     Other Quadruped Lumbar Exercises  Alternating "fire hydrant" x 10 reps; limited motion      Knee/Hip Exercises: Standing   Hip Abduction  Right;Left;10 reps;Knee straight    Abduction Limitations  green TB     Hip Extension  Right;Left;10 reps;Knee straight    Extension Limitations  green TB; 45 dg kickback      Modalities   Modalities  Electrical Stimulation;Moist Heat      Moist Heat Therapy   Number Minutes Moist Heat  15 Minutes    Moist Heat Location  Lumbar Spine      Electrical Stimulation   Electrical Stimulation Location  R sided lumbar    Electrical Stimulation Action  IFC    Electrical Stimulation Parameters  to tolerance     Electrical Stimulation Goals  Pain;Tone      Manual Therapy   Manual Therapy  Passive ROM    Manual therapy comments  supine     Passive ROM  Manual B SKTC, glute, piriformis stretch with therapist x 20 sec              PT Education - 04/21/17 1855     Education provided  Yes    Education Details  sidelying clam shell with green TB issued to pt.    Person(s) Educated  Patient    Methods  Explanation;Verbal cues;Handout    Comprehension  Verbalized understanding;Verbal cues required;Need further instruction;Returned demonstration       PT Short Term Goals - 04/21/17 1855      PT SHORT TERM GOAL #1   Title  patient to be independent with HEP for gentle stretching and strengthening    Status  On-going        PT Long Term Goals - 04/21/17 1432      PT LONG TERM GOAL #1   Title  patient to be independent with advanced HEP    Status  On-going      PT LONG TERM GOAL #2   Title  patient to improve R hip strength to >/= 4+/5    Status  On-going      PT LONG TERM GOAL #3   Title  patient to report pain reduction by >/= 75% for greater than 2 weeks    Status  On-going      PT LONG TERM GOAL #4   Title  patient to demonstrate improved tissue quality with reduced TTP and increased flexibility    Status  On-going            Plan - 04/21/17 1432    Clinical Impression Statement  [name] doing well today reporting he did have prolonged relief from LBP following E-stim/moist heat last visit and requesting this again today.  Able to progress to standing lumbopelvic strengthening activities without significant rise in pain.  HEP updated.  Will monitor response and progress as pt. able in coming visits.      PT Treatment/Interventions  ADLs/Self Care Home Management;Cryotherapy;Electrical Stimulation;Iontophoresis 4mg /ml Dexamethasone;Moist Heat;Traction;Therapeutic exercise;Therapeutic activities;Functional mobility training;Neuromuscular re-education;Patient/family education;Manual techniques;Vasopneumatic Device;Taping;Dry needling;Passive range of motion    Consulted and Agree with Plan of Care  Patient  Patient will benefit from skilled therapeutic intervention in order to improve the following deficits and impairments:   Decreased activity tolerance, Decreased range of motion, Decreased mobility, Decreased strength, Pain  Visit Diagnosis: Chronic right-sided low back pain, with sciatica presence unspecified  Abnormal posture  Other symptoms and signs involving the musculoskeletal system     Problem List Patient Active Problem List   Diagnosis Date Noted  . Hyperlipidemia 01/27/2017  . Cardiovascular risk factor 08/12/2011  . S/P cholecystectomy 06/22/2011  . Personal history of colonic polyps 06/22/2011  . CHOLELITHIASIS 02/13/2010  . FLATULENCE-GAS-BLOATING 02/13/2010  . CHOLELITHIASIS 02/11/2010  . GERD 02/10/2010  . DIVERTICULOSIS-COLON 02/10/2010  . NAUSEA ALONE 02/10/2010  . ABDOMINAL PAIN RIGHT UPPER QUADRANT 02/10/2010  . ABDOMINAL PAIN, EPIGASTRIC 02/10/2010  . ANEMIA-IRON DEFICIENCY 07/17/2008  . INGUINAL HERNIA 07/17/2008  . IRRITABLE BOWEL SYNDROME 07/17/2008  . GASTRITIS 07/11/2008  . DUODENITIS 07/11/2008  . RENAL CYST, RIGHT 07/11/2008  . DYSPEPSIA 06/12/2008  . CONSTIPATION 05/31/2008  . BACK PAIN, CHRONIC 05/31/2008  . BENIGN PROSTATIC HYPERTROPHY, MILD, HX OF 05/31/2008    Bess Harvest, PTA 04/21/17 7:01 PM   [facility] [address]  [address] [place], [place], [address] Phone: [phone]   Fax:  [phone]  Name: DONTAVIOUS EMILY MRN: [id number] Date of Birth: 11-01-1945

## 2017-04-23 ENCOUNTER — Ambulatory Visit: Payer: Medicare Other

## 2017-04-23 DIAGNOSIS — R293 Abnormal posture: Secondary | ICD-10-CM | POA: Diagnosis not present

## 2017-04-23 DIAGNOSIS — R29898 Other symptoms and signs involving the musculoskeletal system: Secondary | ICD-10-CM | POA: Diagnosis not present

## 2017-04-23 DIAGNOSIS — M545 Low back pain: Principal | ICD-10-CM

## 2017-04-23 DIAGNOSIS — G8929 Other chronic pain: Secondary | ICD-10-CM

## 2017-04-23 NOTE — Therapy (Signed)
[facility] [address]  [place], [place], [address] Phone: [phone]   Fax:  [phone]  Physical Therapy Treatment  Patient Details  Name: Manuel Landry MRN: [id number] Date of Birth: [birth year] Referring Provider: Dr. Nani Ravens   Encounter Date: 04/23/2017  PT End of Session - 04/23/17 1026    Visit Number  4    Number of Visits  12    Date for PT Re-Evaluation  05/25/17    Authorization Type  Medicare    PT Start Time  1018    PT Stop Time  1115    PT Time Calculation (min)  57 min    Activity Tolerance  Patient tolerated treatment well    Behavior During Therapy  [facility] for tasks assessed/performed       Past Medical History:  Diagnosis Date  . Adenomatous colon polyp   . Allergy   . Anxiety   . Arthritis   . Backache, unspecified   . Barrett's esophagus    NOT A PROBLEM AT THE CURRENT TIME  . Blind loop syndrome   . BPH (benign prostatic hypertrophy)    DR. DAHLSTEDT  . Bulging discs    LOWER BACK  . Cataract   . Diverticulosis   . Family history of malignant neoplasm of gastrointestinal tract   . Glaucoma   . Hyperlipidemia   . IBS (irritable bowel syndrome)   . Stenosis of rectum and anus   . Unspecified gastritis and gastroduodenitis without mention of hemorrhage   . Vertigo     Past Surgical History:  Procedure Laterality Date  . CHOLECYSTECTOMY    . COLONOSCOPY    . INGUINAL HERNIA REPAIR     x 2  . MANDIBLE SURGERY    . right foot surgery    . ROTATOR CUFF REPAIR Right   . SHOULDER OPEN ROTATOR CUFF REPAIR  03/17/2012   Procedure: ROTATOR CUFF REPAIR SHOULDER OPEN;  Surgeon: Johnn Hai, MD;  Location: [facility];  Service: Orthopedics;  Laterality: Left;  LEFT MINI OPEN ROTATOR CUFF REPAIR   . TONSILLECTOMY    . VASECTOMY      There were no vitals filed for this visit.  Subjective Assessment - 04/23/17 1028    Subjective  [name] reports somewhat increased increased LBP  today with feelings of R glute "tightness".      Patient Stated Goals  improve pain    Currently in Pain?  Yes    Pain Score  4     Pain Location  Back    Pain Orientation  Right;Lower    Pain Descriptors / Indicators  Aching;Sore    Pain Type  Chronic pain    Pain Radiating Towards  none today    Pain Onset  More than a month ago    Pain Frequency  Intermittent    Multiple Pain Sites  No                      [facility] Adult PT Treatment/Exercise - 04/23/17 1046      Self-Care   Self-Care  Other Self-Care Comments    Other Self-Care Comments   Self-ball release to R glute on wall and seated       Lumbar Exercises: Supine   Bridge  15 reps;3 seconds    Bridge Limitations  Alternating hip abd/ER x 2 at top; green tband at knees      Lumbar Exercises: Sidelying  Clam  15 reps    Clam Limitations  green TB at knees     Other Sidelying Lumbar Exercises  Open book stretch 5" x 5 reps each side       Lumbar Exercises: Quadruped   Straight Leg Raise  10 reps;3 seconds cueing for full hip extension     Straight Leg Raises Limitations  peanut p-ball       Knee/Hip Exercises: Stretches   Other Knee/Hip Stretches  foam roll - R glute on mat table x 30 sec       Knee/Hip Exercises: Standing   Hip Flexion  Right;Left;10 reps;Knee straight    Hip Flexion Limitations  red TB at ankle; 2 ski poles     Hip ADduction  Right;Left;10 reps    Hip ADduction Limitations  red TB at ankle; 2 ski pole support     Hip Abduction  Right;Left;10 reps;Knee straight    Abduction Limitations  red TB at ankle;     Hip Extension  Right;Left;10 reps;Knee straight    Extension Limitations  red TB at ankle; 2 ski poles      Modalities   Modalities  Electrical Stimulation;Moist Heat      Moist Heat Therapy   Number Minutes Moist Heat  15 Minutes    Moist Heat Location  Lumbar Spine      Electrical Stimulation   Electrical Stimulation Location  R sided lumbar    Electrical Stimulation  Action  IFC    Electrical Stimulation Parameters  to tolerance; 15'    Electrical Stimulation Goals  Pain;Tone      Manual Therapy   Manual Therapy  Soft tissue mobilization    Manual therapy comments  supine, L sidelying with R LE elevated on bolster     Soft tissue mobilization  STM/DTM to R glute; some relief reported following this    Passive ROM  Manual B SKTC glute stretch with therapist x 20 sec              PT Education - 04/23/17 1212    Education provided  Yes    Education Details  Figure-4 stretch, piriformis ball release on wall, foam roll to glute, open book stretch     Person(s) Educated  Patient    Methods  Explanation;Demonstration;Verbal cues;Handout    Comprehension  Verbalized understanding;Returned demonstration;Verbal cues required;Need further instruction       PT Short Term Goals - 04/21/17 1855      PT SHORT TERM GOAL #1   Title  patient to be independent with HEP for gentle stretching and strengthening    Status  On-going        PT Long Term Goals - 04/21/17 1432      PT LONG TERM GOAL #1   Title  patient to be independent with advanced HEP    Status  On-going      PT LONG TERM GOAL #2   Title  patient to improve R hip strength to >/= 4+/5    Status  On-going      PT LONG TERM GOAL #3   Title  patient to report pain reduction by >/= 75% for greater than 2 weeks    Status  On-going      PT LONG TERM GOAL #4   Title  patient to demonstrate improved tissue quality with reduced TTP and increased flexibility    Status  On-going            Plan -  04/23/17 1043    Clinical Impression Statement  Pt. reporting somewhat increased LBP and feelings of R buttocks "tightness" today.  Treatment focusing on advancement of hip strengthening and instruction on techniques for self-glute/piriformis release and stretching at home.  Pt. ttp throughout R glute with STM/DTM however reported some improvement following this.  Pt. requesting E-stim/moist  heat to end treatment as he feels this gives him prolonged relief into night following therapy visits.      PT Treatment/Interventions  ADLs/Self Care Home Management;Cryotherapy;Electrical Stimulation;Iontophoresis 4mg /ml Dexamethasone;Moist Heat;Traction;Therapeutic exercise;Therapeutic activities;Functional mobility training;Neuromuscular re-education;Patient/family education;Manual techniques;Vasopneumatic Device;Taping;Dry needling;Passive range of motion    Consulted and Agree with Plan of Care  Patient       Patient will benefit from skilled therapeutic intervention in order to improve the following deficits and impairments:  Decreased activity tolerance, Decreased range of motion, Decreased mobility, Decreased strength, Pain  Visit Diagnosis: Chronic right-sided low back pain, with sciatica presence unspecified  Abnormal posture  Other symptoms and signs involving the musculoskeletal system     Problem List Patient Active Problem List   Diagnosis Date Noted  . Hyperlipidemia 01/27/2017  . Cardiovascular risk factor 08/12/2011  . S/P cholecystectomy 06/22/2011  . Personal history of colonic polyps 06/22/2011  . CHOLELITHIASIS 02/13/2010  . FLATULENCE-GAS-BLOATING 02/13/2010  . CHOLELITHIASIS 02/11/2010  . GERD 02/10/2010  . DIVERTICULOSIS-COLON 02/10/2010  . NAUSEA ALONE 02/10/2010  . ABDOMINAL PAIN RIGHT UPPER QUADRANT 02/10/2010  . ABDOMINAL PAIN, EPIGASTRIC 02/10/2010  . ANEMIA-IRON DEFICIENCY 07/17/2008  . INGUINAL HERNIA 07/17/2008  . IRRITABLE BOWEL SYNDROME 07/17/2008  . GASTRITIS 07/11/2008  . DUODENITIS 07/11/2008  . RENAL CYST, RIGHT 07/11/2008  . DYSPEPSIA 06/12/2008  . CONSTIPATION 05/31/2008  . BACK PAIN, CHRONIC 05/31/2008  . BENIGN PROSTATIC HYPERTROPHY, MILD, HX OF 05/31/2008    Bess Harvest, PTA 04/23/17 12:25 PM    [facility] [address]  [place], [place], [address] Phone:  [phone]   Fax:  [phone]  Name: JOHNNY GORTER MRN: [id number] Date of Birth: 1945/09/01

## 2017-04-26 ENCOUNTER — Ambulatory Visit: Payer: Medicare Other | Admitting: Physical Therapy

## 2017-04-28 ENCOUNTER — Ambulatory Visit: Payer: Medicare Other

## 2017-04-28 DIAGNOSIS — R29898 Other symptoms and signs involving the musculoskeletal system: Secondary | ICD-10-CM | POA: Diagnosis not present

## 2017-04-28 DIAGNOSIS — M545 Low back pain: Secondary | ICD-10-CM | POA: Diagnosis not present

## 2017-04-28 DIAGNOSIS — G8929 Other chronic pain: Secondary | ICD-10-CM

## 2017-04-28 DIAGNOSIS — R293 Abnormal posture: Secondary | ICD-10-CM

## 2017-04-28 NOTE — Therapy (Signed)
[facility] [address]  [place], [place], [address] Phone: [phone]   Fax:  [phone]  Physical Therapy Treatment  Patient Details  Name: Manuel Landry MRN: [id number] Date of Birth: [birth year] Referring Provider: Dr. Nani Ravens   Encounter Date: 04/28/2017  PT End of Session - 04/28/17 0924    Visit Number  5    Number of Visits  12    Date for PT Re-Evaluation  05/25/17    Authorization Type  Medicare    PT Start Time  0920    PT Stop Time  1018    PT Time Calculation (min)  58 min    Activity Tolerance  Patient tolerated treatment well    Behavior During Therapy  [facility] for tasks assessed/performed       Past Medical History:  Diagnosis Date  . Adenomatous colon polyp   . Allergy   . Anxiety   . Arthritis   . Backache, unspecified   . Barrett's esophagus    NOT A PROBLEM AT THE CURRENT TIME  . Blind loop syndrome   . BPH (benign prostatic hypertrophy)    DR. DAHLSTEDT  . Bulging discs    LOWER BACK  . Cataract   . Diverticulosis   . Family history of malignant neoplasm of gastrointestinal tract   . Glaucoma   . Hyperlipidemia   . IBS (irritable bowel syndrome)   . Stenosis of rectum and anus   . Unspecified gastritis and gastroduodenitis without mention of hemorrhage   . Vertigo     Past Surgical History:  Procedure Laterality Date  . CHOLECYSTECTOMY    . COLONOSCOPY    . INGUINAL HERNIA REPAIR     x 2  . MANDIBLE SURGERY    . right foot surgery    . ROTATOR CUFF REPAIR Right   . SHOULDER OPEN ROTATOR CUFF REPAIR  03/17/2012   Procedure: ROTATOR CUFF REPAIR SHOULDER OPEN;  Surgeon: Johnn Hai, MD;  Location: [facility];  Service: Orthopedics;  Laterality: Left;  LEFT MINI OPEN ROTATOR CUFF REPAIR   . TONSILLECTOMY    . VASECTOMY      There were no vitals filed for this visit.  Subjective Assessment - 04/28/17 0921    Subjective  Pt. reporting some muscular soreness from  shoveling driveway yesterday .  Some muscular soreness following last visit which improved.      Patient Stated Goals  improve pain    Currently in Pain?  Yes    Pain Score  6     Pain Location  Buttocks    Pain Orientation  Right;Upper;Medial    Pain Descriptors / Indicators  Aching;Sore    Pain Type  Chronic pain    Pain Radiating Towards  none today    Pain Onset  More than a month ago    Pain Frequency  Intermittent    Aggravating Factors   bending, twisting    Pain Relieving Factors  hot shower, stretching    Multiple Pain Sites  No                      [facility] Adult PT Treatment/Exercise - 04/28/17 0944      Lumbar Exercises: Aerobic   Stationary Bike  Lvl 3, 6 min       Lumbar Exercises: Standing   Functional Squats  10 reps;3 seconds    Functional Squats Limitations  to parallel; 2 UE support  on TM      Knee/Hip Exercises: Stretches   Other Knee/Hip Stretches  foam roll - R glute on mat table x 1 min       Knee/Hip Exercises: Standing   Hip Flexion  Right;Left;10 reps;Knee straight    Hip Flexion Limitations  red TB at ankle; 2 ski poles     Hip ADduction  Right;Left;10 reps    Hip ADduction Limitations  red TB at ankle; 2 ski pole support     Hip Abduction  Right;Left;10 reps;Knee straight    Abduction Limitations  red TB at ankle;     Hip Extension  Right;Left;10 reps;Knee straight    Extension Limitations  red TB at ankle; 2 ski poles      Modalities   Modalities  Electrical Stimulation;Moist Heat      Moist Heat Therapy   Number Minutes Moist Heat  15 Minutes    Moist Heat Location  Lumbar Spine      Electrical Stimulation   Electrical Stimulation Location  R glute     Electrical Stimulation Action  IFC    Electrical Stimulation Parameters  to tolerance, 15'     Electrical Stimulation Goals  Pain;Tone      Manual Therapy   Manual Therapy  Soft tissue mobilization;Myofascial release    Manual therapy comments  supine, L sidelying with R LE  elevated on bolster     Soft tissue mobilization  STM/DTM to R glute; most tenderness along medial glute; some relief reported following this    Myofascial Release  TPR to R medial glute over area of most tenderness    Passive ROM  Manual R glute, piriformis, HS stretch 2 x 30 sec  due to reported "tightness" in R buttocks              PT Education - 04/28/17 1014    Education provided  Yes    Education Details  4-way SLR with red TB issued to pt.     Person(s) Educated  Patient    Methods  Explanation;Demonstration;Verbal cues;Handout    Comprehension  Verbalized understanding;Returned demonstration;Verbal cues required;Need further instruction       PT Short Term Goals - 04/28/17 1018      PT SHORT TERM GOAL #1   Title  patient to be independent with HEP for gentle stretching and strengthening    Status  Achieved        PT Long Term Goals - 04/21/17 1432      PT LONG TERM GOAL #1   Title  patient to be independent with advanced HEP    Status  On-going      PT LONG TERM GOAL #2   Title  patient to improve R hip strength to >/= 4+/5    Status  On-going      PT LONG TERM GOAL #3   Title  patient to report pain reduction by >/= 75% for greater than 2 weeks    Status  On-going      PT LONG TERM GOAL #4   Title  patient to demonstrate improved tissue quality with reduced TTP and increased flexibility    Status  On-going            Plan - 04/28/17 0925    Clinical Impression Statement  Manuel Landry reporting increased R buttocks pain today and attributes this to shoveling snow off driveway yesterday.  Reports most relief from buttocks pain in therapy from E-stim/moist heat and STM/DTM performed  last visit.  Encouraged pt. to try buttocks/piriformis release with tennis ball on wall at home as he has not yet tried this.  Progressed hip strengthening activities today, which were tolerated well.  Pt. continues to report consistent performance of LE stretching daily.  HEP  updated to include 4-way SLR with red TB today as pt. able to perform with good technique.  Manuel Landry reporting he feels he is making progress with therapy at this point.  Further discussion with pt. today regarding coverage for home TENS/IT unit however have not yet heard back from rep.  Will f/u regarding this at upcoming visit.      PT Treatment/Interventions  ADLs/Self Care Home Management;Cryotherapy;Electrical Stimulation;Iontophoresis 4mg /ml Dexamethasone;Moist Heat;Traction;Therapeutic exercise;Therapeutic activities;Functional mobility training;Neuromuscular re-education;Patient/family education;Manual techniques;Vasopneumatic Device;Taping;Dry needling;Passive range of motion    Consulted and Agree with Plan of Care  Patient       Patient will benefit from skilled therapeutic intervention in order to improve the following deficits and impairments:  Decreased activity tolerance, Decreased range of motion, Decreased mobility, Decreased strength, Pain  Visit Diagnosis: Chronic right-sided low back pain, with sciatica presence unspecified  Abnormal posture  Other symptoms and signs involving the musculoskeletal system     Problem List Patient Active Problem List   Diagnosis Date Noted  . Hyperlipidemia 01/27/2017  . Cardiovascular risk factor 08/12/2011  . S/P cholecystectomy 06/22/2011  . Personal history of colonic polyps 06/22/2011  . CHOLELITHIASIS 02/13/2010  . FLATULENCE-GAS-BLOATING 02/13/2010  . CHOLELITHIASIS 02/11/2010  . GERD 02/10/2010  . DIVERTICULOSIS-COLON 02/10/2010  . NAUSEA ALONE 02/10/2010  . ABDOMINAL PAIN RIGHT UPPER QUADRANT 02/10/2010  . ABDOMINAL PAIN, EPIGASTRIC 02/10/2010  . ANEMIA-IRON DEFICIENCY 07/17/2008  . INGUINAL HERNIA 07/17/2008  . IRRITABLE BOWEL SYNDROME 07/17/2008  . GASTRITIS 07/11/2008  . DUODENITIS 07/11/2008  . RENAL CYST, RIGHT 07/11/2008  . DYSPEPSIA 06/12/2008  . CONSTIPATION 05/31/2008  . BACK PAIN, CHRONIC 05/31/2008  . BENIGN  PROSTATIC HYPERTROPHY, MILD, HX OF 05/31/2008    Bess Harvest, PTA 04/28/17 12:24 PM  [facility] [address]  [place], [place], [address] Phone: [phone]   Fax:  [phone]  Name: Manuel Landry MRN: [id number] Date of Birth: Jun 26, 1945

## 2017-04-30 ENCOUNTER — Ambulatory Visit: Payer: Medicare Other | Admitting: Physical Therapy

## 2017-04-30 ENCOUNTER — Encounter: Payer: Self-pay | Admitting: Physical Therapy

## 2017-04-30 DIAGNOSIS — G8929 Other chronic pain: Secondary | ICD-10-CM | POA: Diagnosis not present

## 2017-04-30 DIAGNOSIS — M545 Low back pain: Principal | ICD-10-CM

## 2017-04-30 DIAGNOSIS — R293 Abnormal posture: Secondary | ICD-10-CM

## 2017-04-30 DIAGNOSIS — R29898 Other symptoms and signs involving the musculoskeletal system: Secondary | ICD-10-CM

## 2017-04-30 NOTE — Therapy (Signed)
[facility] [address]  [place], [place], [address] Phone: [phone]   Fax:  [phone]  Physical Therapy Treatment  Patient Details  Name: Manuel Landry MRN: [id number] Date of Birth: 1946/04/03 Referring Provider: Dr. Nani Ravens   Encounter Date: 04/30/2017  PT End of Session - 04/30/17 1011    Visit Number  6    Number of Visits  12    Date for PT Re-Evaluation  05/25/17    Authorization Type  Medicare    PT Start Time  0924    PT Stop Time  1025    PT Time Calculation (min)  61 min    Activity Tolerance  Patient tolerated treatment well    Behavior During Therapy  [facility] for tasks assessed/performed       Past Medical History:  Diagnosis Date  . Adenomatous colon polyp   . Allergy   . Anxiety   . Arthritis   . Backache, unspecified   . Barrett's esophagus    NOT A PROBLEM AT THE CURRENT TIME  . Blind loop syndrome   . BPH (benign prostatic hypertrophy)    DR. DAHLSTEDT  . Bulging discs    LOWER BACK  . Cataract   . Diverticulosis   . Family history of malignant neoplasm of gastrointestinal tract   . Glaucoma   . Hyperlipidemia   . IBS (irritable bowel syndrome)   . Stenosis of rectum and anus   . Unspecified gastritis and gastroduodenitis without mention of hemorrhage   . Vertigo     Past Surgical History:  Procedure Laterality Date  . CHOLECYSTECTOMY    . COLONOSCOPY    . INGUINAL HERNIA REPAIR     x 2  . MANDIBLE SURGERY    . right foot surgery    . ROTATOR CUFF REPAIR Right   . SHOULDER OPEN ROTATOR CUFF REPAIR  03/17/2012   Procedure: ROTATOR CUFF REPAIR SHOULDER OPEN;  Surgeon: Johnn Hai, MD;  Location: [facility];  Service: Orthopedics;  Laterality: Left;  LEFT MINI OPEN ROTATOR CUFF REPAIR   . TONSILLECTOMY    . VASECTOMY      There were no vitals filed for this visit.  Subjective Assessment - 04/30/17 0927    Subjective  Patient woke up feeling sore this morning.  Continuing to have pain in low back and buttock.    Currently in Pain?  Yes    Pain Score  8     Pain Location  Buttocks    Pain Orientation  Right;Upper    Pain Descriptors / Indicators  Aching    Pain Type  Chronic pain                      [facility] Adult PT Treatment/Exercise - 04/30/17 0929      Lumbar Exercises: Aerobic   Stationary Bike  Lvl 3, 6 min       Lumbar Exercises: Machines for Strengthening   Cybex Knee Extension  B 25#; 15 reps    Cybex Knee Flexion  B 35#; 15 reps      Lumbar Exercises: Standing   Functional Squats  10 reps    Functional Squats Limitations  TRX    Wall Slides  15 reps;3 seconds ball squeeze      Lumbar Exercises: Supine   Bent Knee Raise  10 reps    Dead Bug  10 reps;3 seconds    Isometric Hip Flexion  15 reps;3 seconds      Lumbar Exercises: Sidelying   Hip Abduction  15 reps;3 seconds    Hip Abduction Limitations  2#, R only      Knee/Hip Exercises: Standing   Other Standing Knee Exercises  side stepping, monster walk fwd/bwd; green tband; 2 x 33ft each      Modalities   Modalities  Electrical Stimulation;Moist Heat      Moist Heat Therapy   Number Minutes Moist Heat  15 Minutes    Moist Heat Location  Lumbar Spine      Electrical Stimulation   Electrical Stimulation Location  R sided lumbar    Electrical Stimulation Action  IFC    Electrical Stimulation Parameters  to tolerance    Electrical Stimulation Goals  Pain               PT Short Term Goals - 04/28/17 1018      PT SHORT TERM GOAL #1   Title  patient to be independent with HEP for gentle stretching and strengthening    Status  Achieved        PT Long Term Goals - 04/21/17 1432      PT LONG TERM GOAL #1   Title  patient to be independent with advanced HEP    Status  On-going      PT LONG TERM GOAL #2   Title  patient to improve R hip strength to >/= 4+/5    Status  On-going      PT LONG TERM GOAL #3   Title  patient to report pain  reduction by >/= 75% for greater than 2 weeks    Status  On-going      PT LONG TERM GOAL #4   Title  patient to demonstrate improved tissue quality with reduced TTP and increased flexibility    Status  On-going            Plan - 04/30/17 1011    Clinical Impression Statement  Patient beginning session today with significant pain in R low back/buttock, however decreased to 3-4 throughout treatment. Progressed patient strengthening today and focused on abdominal activation during exercises to increase core stability. Patient given home TENS unit today and educated on proper use with good understanding. Will continue to progress to patient tolerance.     PT Treatment/Interventions  ADLs/Self Care Home Management;Cryotherapy;Electrical Stimulation;Iontophoresis 4mg /ml Dexamethasone;Moist Heat;Traction;Therapeutic exercise;Therapeutic activities;Functional mobility training;Neuromuscular re-education;Patient/family education;Manual techniques;Vasopneumatic Device;Taping;Dry needling;Passive range of motion    Consulted and Agree with Plan of Care  Patient       Patient will benefit from skilled therapeutic intervention in order to improve the following deficits and impairments:  Decreased activity tolerance, Decreased range of motion, Decreased mobility, Decreased strength, Pain  Visit Diagnosis: Chronic right-sided low back pain, with sciatica presence unspecified  Abnormal posture  Other symptoms and signs involving the musculoskeletal system     Problem List Patient Active Problem List   Diagnosis Date Noted  . Hyperlipidemia 01/27/2017  . Cardiovascular risk factor 08/12/2011  . S/P cholecystectomy 06/22/2011  . Personal history of colonic polyps 06/22/2011  . CHOLELITHIASIS 02/13/2010  . FLATULENCE-GAS-BLOATING 02/13/2010  . CHOLELITHIASIS 02/11/2010  . GERD 02/10/2010  . DIVERTICULOSIS-COLON 02/10/2010  . NAUSEA ALONE 02/10/2010  . ABDOMINAL PAIN RIGHT UPPER QUADRANT  02/10/2010  . ABDOMINAL PAIN, EPIGASTRIC 02/10/2010  . ANEMIA-IRON DEFICIENCY 07/17/2008  . INGUINAL HERNIA 07/17/2008  . IRRITABLE BOWEL SYNDROME 07/17/2008  . GASTRITIS 07/11/2008  . DUODENITIS 07/11/2008  .  RENAL CYST, RIGHT 07/11/2008  . DYSPEPSIA 06/12/2008  . CONSTIPATION 05/31/2008  . BACK PAIN, CHRONIC 05/31/2008  . BENIGN PROSTATIC HYPERTROPHY, MILD, HX OF 05/31/2008     Mickle Asper, SPT 04/30/17 10:38 AM    [facility] [address]  [place], [place], [address] Phone: [phone]   Fax:  [phone]  Name: Manuel Landry MRN: [id number] Date of Birth: 1946/01/15

## 2017-05-03 ENCOUNTER — Ambulatory Visit: Payer: Medicare Other

## 2017-05-03 DIAGNOSIS — G8929 Other chronic pain: Secondary | ICD-10-CM

## 2017-05-03 DIAGNOSIS — R29898 Other symptoms and signs involving the musculoskeletal system: Secondary | ICD-10-CM | POA: Diagnosis not present

## 2017-05-03 DIAGNOSIS — M545 Low back pain: Principal | ICD-10-CM

## 2017-05-03 DIAGNOSIS — R293 Abnormal posture: Secondary | ICD-10-CM | POA: Diagnosis not present

## 2017-05-03 NOTE — Therapy (Signed)
[facility] [address]  [place], [place], [address] Phone: [phone]   Fax:  [phone]  Physical Therapy Treatment  Patient Details  Name: Manuel Landry MRN: [id number] Date of Birth: 1946-01-11 Referring Provider: Dr. Nani Ravens   Encounter Date: 05/03/2017  PT End of Session - 05/03/17 1519    Visit Number  7    Number of Visits  12    Date for PT Re-Evaluation  05/25/17    Authorization Type  Medicare    PT Start Time  1522    PT Stop Time  1628    PT Time Calculation (min)  66 min    Activity Tolerance  Patient tolerated treatment well    Behavior During Therapy  [facility] for tasks assessed/performed       Past Medical History:  Diagnosis Date  . Adenomatous colon polyp   . Allergy   . Anxiety   . Arthritis   . Backache, unspecified   . Barrett's esophagus    NOT A PROBLEM AT THE CURRENT TIME  . Blind loop syndrome   . BPH (benign prostatic hypertrophy)    DR. DAHLSTEDT  . Bulging discs    LOWER BACK  . Cataract   . Diverticulosis   . Family history of malignant neoplasm of gastrointestinal tract   . Glaucoma   . Hyperlipidemia   . IBS (irritable bowel syndrome)   . Stenosis of rectum and anus   . Unspecified gastritis and gastroduodenitis without mention of hemorrhage   . Vertigo     Past Surgical History:  Procedure Laterality Date  . CHOLECYSTECTOMY    . COLONOSCOPY    . INGUINAL HERNIA REPAIR     x 2  . MANDIBLE SURGERY    . right foot surgery    . ROTATOR CUFF REPAIR Right   . SHOULDER OPEN ROTATOR CUFF REPAIR  03/17/2012   Procedure: ROTATOR CUFF REPAIR SHOULDER OPEN;  Surgeon: Johnn Hai, MD;  Location: [facility];  Service: Orthopedics;  Laterality: Left;  LEFT MINI OPEN ROTATOR CUFF REPAIR   . TONSILLECTOMY    . VASECTOMY      There were no vitals filed for this visit.  Subjective Assessment - 05/03/17 1520    Subjective  Pt. reporting his pain is worse in mornings  and steadily declines as day progresses.  Pt. reports, "I feel i'm hitting a Plateau with my pain".      How long can you walk comfortably?  pain relief with walking    Patient Stated Goals  improve pain    Currently in Pain?  Yes    Pain Score  3     Pain Location  Buttocks    Pain Orientation  Right;Upper    Pain Descriptors / Indicators  Aching    Pain Type  Chronic pain    Pain Radiating Towards  none today     Pain Onset  More than a month ago    Pain Frequency  Intermittent    Aggravating Factors   bending, twisting    Pain Relieving Factors  stretching    Multiple Pain Sites  No                      [facility] Adult PT Treatment/Exercise - 05/03/17 1544      Lumbar Exercises: Aerobic   Stationary Bike  Lvl 3, 6 min       Lumbar Exercises: Standing  Side Lunge  10 reps;3 seconds    Side Lunge Limitations  TRX     Scapular Retraction  --    Row  Both;10 reps    Theraband Level (Row)  Level 3 (Green)    Row Limitations  5" hold     Shoulder Extension  Both;10 reps    Theraband Level (Shoulder Extension)  Level 3 (Green)      Lumbar Exercises: Seated   Long Arc Quad on [place]  Both;15 reps    Other Seated Lumbar Exercises  Seated B pallof press with green TB x 15 reps seated on green p-ball     Other Seated Lumbar Exercises  B single arm row with double green TB seated on green p-ball x 15 reps       Lumbar Exercises: Supine   Dead Bug  10 reps;3 seconds    Isometric Hip Flexion  15 reps;5 seconds      Lumbar Exercises: Sidelying   Other Sidelying Lumbar Exercises  B sidelying adduction SLR x 10 reps  demo'd to check for tolerance to substitute for 4-way SLR       Lumbar Exercises: Quadruped   Straight Leg Raise  3 seconds;15 reps    Straight Leg Raises Limitations  peanut p-ball     Other Quadruped Lumbar Exercises  Alternating "fire hydrant" x 10 reps; limited motion      Modalities   Modalities  Electrical Stimulation;Moist Heat      Moist Heat  Therapy   Number Minutes Moist Heat  15 Minutes    Moist Heat Location  Lumbar Spine      Electrical Stimulation   Electrical Stimulation Location  R sided lumbar    Electrical Stimulation Action  IFC    Electrical Stimulation Parameters  to tolerance; 15'    Electrical Stimulation Goals  Pain      Manual Therapy   Manual Therapy  Soft tissue mobilization;Myofascial release    Manual therapy comments  supine, L sidelying with R LE elevated on bolster     Soft tissue mobilization  STM/DTM to R glute; most tenderness along medial glute; some relief reported following this    Myofascial Release  TPR to R medial glute over area of most tenderness               PT Short Term Goals - 04/28/17 1018      PT SHORT TERM GOAL #1   Title  patient to be independent with HEP for gentle stretching and strengthening    Status  Achieved        PT Long Term Goals - 04/21/17 1432      PT LONG TERM GOAL #1   Title  patient to be independent with advanced HEP    Status  On-going      PT LONG TERM GOAL #2   Title  patient to improve R hip strength to >/= 4+/5    Status  On-going      PT LONG TERM GOAL #3   Title  patient to report pain reduction by >/= 75% for greater than 2 weeks    Status  On-going      PT LONG TERM GOAL #4   Title  patient to demonstrate improved tissue quality with reduced TTP and increased flexibility    Status  On-going            Plan - 05/03/17 1824    Clinical Impression Statement  [name] reporting, "I  feel like I've hit a plateau with my pain".  Pt. expressing some frustration today that his morning back/buttocks pain levels seem to be unaffected by his efforts with therapy and with HEP.  Pt. still TTP over R superior/medial glutes today and with good response following manual work to this area.  Tolerated all strengthening therex well in treatment and reporting drop in pain levels following therex.  Ended treatment with E-stim/moist heat for further  pain relief as pt. reports lasting relief from this.  Pt. progressing well with all strengthening activities at this point and seems to be progressing well toward goals    PT Treatment/Interventions  ADLs/Self Care Home Management;Cryotherapy;Electrical Stimulation;Iontophoresis 4mg /ml Dexamethasone;Moist Heat;Traction;Therapeutic exercise;Therapeutic activities;Functional mobility training;Neuromuscular re-education;Patient/family education;Manual techniques;Vasopneumatic Device;Taping;Dry needling;Passive range of motion    Consulted and Agree with Plan of Care  Patient       Patient will benefit from skilled therapeutic intervention in order to improve the following deficits and impairments:  Decreased activity tolerance, Decreased range of motion, Decreased mobility, Decreased strength, Pain  Visit Diagnosis: Chronic right-sided low back pain, with sciatica presence unspecified  Abnormal posture  Other symptoms and signs involving the musculoskeletal system     Problem List Patient Active Problem List   Diagnosis Date Noted  . Hyperlipidemia 01/27/2017  . Cardiovascular risk factor 08/12/2011  . S/P cholecystectomy 06/22/2011  . Personal history of colonic polyps 06/22/2011  . CHOLELITHIASIS 02/13/2010  . FLATULENCE-GAS-BLOATING 02/13/2010  . CHOLELITHIASIS 02/11/2010  . GERD 02/10/2010  . DIVERTICULOSIS-COLON 02/10/2010  . NAUSEA ALONE 02/10/2010  . ABDOMINAL PAIN RIGHT UPPER QUADRANT 02/10/2010  . ABDOMINAL PAIN, EPIGASTRIC 02/10/2010  . ANEMIA-IRON DEFICIENCY 07/17/2008  . INGUINAL HERNIA 07/17/2008  . IRRITABLE BOWEL SYNDROME 07/17/2008  . GASTRITIS 07/11/2008  . DUODENITIS 07/11/2008  . RENAL CYST, RIGHT 07/11/2008  . DYSPEPSIA 06/12/2008  . CONSTIPATION 05/31/2008  . BACK PAIN, CHRONIC 05/31/2008  . BENIGN PROSTATIC HYPERTROPHY, MILD, HX OF 05/31/2008    Manuel Landry, PTA 05/03/17 6:33 PM  [facility] [address]  [place], [place], [address] Phone: [phone]   Fax:  [phone]  Name: Manuel Landry MRN: [id number] Date of Birth: [birth year]

## 2017-05-07 ENCOUNTER — Ambulatory Visit: Payer: Medicare Other | Admitting: Physical Therapy

## 2017-05-07 ENCOUNTER — Encounter: Payer: Self-pay | Admitting: Physical Therapy

## 2017-05-07 DIAGNOSIS — M545 Low back pain: Secondary | ICD-10-CM | POA: Diagnosis not present

## 2017-05-07 DIAGNOSIS — R29898 Other symptoms and signs involving the musculoskeletal system: Secondary | ICD-10-CM | POA: Diagnosis not present

## 2017-05-07 DIAGNOSIS — R293 Abnormal posture: Secondary | ICD-10-CM | POA: Diagnosis not present

## 2017-05-07 DIAGNOSIS — G8929 Other chronic pain: Secondary | ICD-10-CM

## 2017-05-07 NOTE — Therapy (Addendum)
[facility] [address]  [place], [place], [address] Phone: [phone]   Fax:  [phone]  Physical Therapy Treatment  Patient Details  Name: Manuel Landry MRN: [id number] Date of Birth: 1945/11/21 Referring Provider: Dr. Nani Ravens   Encounter Date: 05/07/2017  PT End of Session - 05/07/17 0942    Visit Number  8    Number of Visits  12    Date for PT Re-Evaluation  05/25/17    Authorization Type  Medicare    PT Start Time  0931    PT Stop Time  1033    PT Time Calculation (min)  62 min    Activity Tolerance  Patient tolerated treatment well    Behavior During Therapy  [facility] for tasks assessed/performed       Past Medical History:  Diagnosis Date  . Adenomatous colon polyp   . Allergy   . Anxiety   . Arthritis   . Backache, unspecified   . Barrett's esophagus    NOT A PROBLEM AT THE CURRENT TIME  . Blind loop syndrome   . BPH (benign prostatic hypertrophy)    DR. DAHLSTEDT  . Bulging discs    LOWER BACK  . Cataract   . Diverticulosis   . Family history of malignant neoplasm of gastrointestinal tract   . Glaucoma   . Hyperlipidemia   . IBS (irritable bowel syndrome)   . Stenosis of rectum and anus   . Unspecified gastritis and gastroduodenitis without mention of hemorrhage   . Vertigo     Past Surgical History:  Procedure Laterality Date  . CHOLECYSTECTOMY    . COLONOSCOPY    . INGUINAL HERNIA REPAIR     x 2  . MANDIBLE SURGERY    . right foot surgery    . ROTATOR CUFF REPAIR Right   . SHOULDER OPEN ROTATOR CUFF REPAIR  03/17/2012   Procedure: ROTATOR CUFF REPAIR SHOULDER OPEN;  Surgeon: Johnn Hai, MD;  Location: [facility];  Service: Orthopedics;  Laterality: Left;  LEFT MINI OPEN ROTATOR CUFF REPAIR   . TONSILLECTOMY    . VASECTOMY      There were no vitals filed for this visit.  Subjective Assessment - 05/07/17 0937    Subjective  had his normal 2-3 pain this morning    Patient Stated Goals  improve pain    Currently in Pain?  Yes    Pain Score  2     Pain Location  Back    Pain Orientation  Right    Pain Descriptors / Indicators  Sore    Pain Type  Chronic pain                      [facility] Adult PT Treatment/Exercise - 05/07/17 0001      Lumbar Exercises: Stretches   Passive Hamstring Stretch  3 reps;30 seconds    Passive Hamstring Stretch Limitations  B - supine with strap    Single Knee to Chest Stretch  5 reps;10 seconds    Piriformis Stretch  2 reps;30 seconds    Piriformis Stretch Limitations  bilateral; supine figure 4      Lumbar Exercises: Aerobic   Stationary Bike  Lvl 3, 6 min       Lumbar Exercises: Standing   Other Standing Lumbar Exercises  bwd lunge x 10 each LE - single handhold      Lumbar Exercises: Supine   Other  Supine Lumbar Exercises  HS bridge x 15 reps    Other Supine Lumbar Exercises  bridge with hip abduction with green tband x 15 reps      Modalities   Modalities  Electrical Stimulation;Moist Heat      Moist Heat Therapy   Number Minutes Moist Heat  -- 15    Moist Heat Location  Lumbar Spine      Electrical Stimulation   Electrical Stimulation Location  R sided lumbar    Electrical Stimulation Action  IFC    Electrical Stimulation Parameters  to tolerance    Electrical Stimulation Goals  Pain               PT Short Term Goals - 04/28/17 1018      PT SHORT TERM GOAL #1   Title  patient to be independent with HEP for gentle stretching and strengthening    Status  Achieved        PT Long Term Goals - 05/07/17 1230      PT LONG TERM GOAL #1   Title  patient to be independent with advanced HEP    Status  Achieved      PT LONG TERM GOAL #2   Title  patient to improve R hip strength to >/= 4+/5    Status  Achieved      PT LONG TERM GOAL #3   Title  patient to report pain reduction by >/= 75% for greater than 2 weeks    Status  On-going      PT LONG TERM GOAL #4   Title   patient to demonstrate improved tissue quality with reduced TTP and increased flexibility    Status  Achieved            Plan - 05/07/17 1231    Clinical Impression Statement  Patient today with reports of improved pain symptoms. PT session today focused on gentle stretching and strengthening to not aggravate symptoms any further with god tolerance. Did dicuss patient taking 1 week break from PT and then assess need for further PT vs transition to HEP. All goals seemingly met at this point other than pain frequency/intensity that will now hopefully begin to decline at a more steady rate.     PT Treatment/Interventions  ADLs/Self Care Home Management;Cryotherapy;Electrical Stimulation;Iontophoresis 37m/ml Dexamethasone;Moist Heat;Traction;Therapeutic exercise;Therapeutic activities;Functional mobility training;Neuromuscular re-education;Patient/family education;Manual techniques;Vasopneumatic Device;Taping;Dry needling;Passive range of motion    Consulted and Agree with Plan of Care  Patient       Patient will benefit from skilled therapeutic intervention in order to improve the following deficits and impairments:  Decreased activity tolerance, Decreased range of motion, Decreased mobility, Decreased strength, Pain  Visit Diagnosis: Chronic right-sided low back pain, with sciatica presence unspecified  Abnormal posture  Other symptoms and signs involving the musculoskeletal system     G-Codes    Functional Assessment Tool Used (Outpatient Only)  Clinical judgement   Functional Limitation  Mobility: Walking and moving around    Mobility: Walking and Moving Around Goal Status [phone]  At least 20 percent but less than 40 percent impaired, limited or restricted    Mobility: Walking and Moving Around Discharge Status [phone]  At least 20 percent but less than 40 percent impaired, limited or restricted        Problem List Patient Active Problem List   Diagnosis Date Noted  .  Hyperlipidemia 01/27/2017  . Cardiovascular risk factor 08/12/2011  . S/P cholecystectomy 06/22/2011  . Personal history of  colonic polyps 06/22/2011  . CHOLELITHIASIS 02/13/2010  . FLATULENCE-GAS-BLOATING 02/13/2010  . CHOLELITHIASIS 02/11/2010  . GERD 02/10/2010  . DIVERTICULOSIS-COLON 02/10/2010  . NAUSEA ALONE 02/10/2010  . ABDOMINAL PAIN RIGHT UPPER QUADRANT 02/10/2010  . ABDOMINAL PAIN, EPIGASTRIC 02/10/2010  . ANEMIA-IRON DEFICIENCY 07/17/2008  . INGUINAL HERNIA 07/17/2008  . IRRITABLE BOWEL SYNDROME 07/17/2008  . GASTRITIS 07/11/2008  . DUODENITIS 07/11/2008  . RENAL CYST, RIGHT 07/11/2008  . DYSPEPSIA 06/12/2008  . CONSTIPATION 05/31/2008  . BACK PAIN, CHRONIC 05/31/2008  . BENIGN PROSTATIC HYPERTROPHY, MILD, HX OF 05/31/2008     Lanney Gins, PT, DPT 05/07/17 12:35 PM  PHYSICAL THERAPY DISCHARGE SUMMARY  Visits from Start of Care: 8  Current functional level related to goals / functional outcomes: See above   Remaining deficits: See above   Education / Equipment: HEP  Plan: Patient agrees to discharge.  Patient goals were partially met. Patient is being discharged due to being pleased with the current functional level.  ?????     Lanney Gins, PT, DPT 07/27/17 9:45 AM   [facility] [address]  [place], [place], [address] Phone: [phone]   Fax:  [phone]  Name: ARISTEO HANKERSON MRN: [id number] Date of Birth: 01/17/1946

## 2017-05-20 DIAGNOSIS — N401 Enlarged prostate with lower urinary tract symptoms: Secondary | ICD-10-CM | POA: Diagnosis not present

## 2017-05-26 DIAGNOSIS — N401 Enlarged prostate with lower urinary tract symptoms: Secondary | ICD-10-CM | POA: Diagnosis not present

## 2017-05-26 DIAGNOSIS — R351 Nocturia: Secondary | ICD-10-CM | POA: Diagnosis not present

## 2017-05-26 DIAGNOSIS — N403 Nodular prostate with lower urinary tract symptoms: Secondary | ICD-10-CM | POA: Diagnosis not present

## 2017-06-02 ENCOUNTER — Encounter: Payer: Self-pay | Admitting: Family Medicine

## 2017-06-02 ENCOUNTER — Ambulatory Visit (INDEPENDENT_AMBULATORY_CARE_PROVIDER_SITE_OTHER): Payer: Medicare Other | Admitting: Family Medicine

## 2017-06-02 VITALS — BP 110/72 | HR 89 | Temp 98.2°F | Ht 71.0 in | Wt 181.1 lb

## 2017-06-02 DIAGNOSIS — H6982 Other specified disorders of Eustachian tube, left ear: Secondary | ICD-10-CM

## 2017-06-02 DIAGNOSIS — J069 Acute upper respiratory infection, unspecified: Secondary | ICD-10-CM | POA: Diagnosis not present

## 2017-06-02 MED ORDER — PREDNISONE 20 MG PO TABS: 40.0000 mg | ORAL_TABLET | Freq: Every day | ORAL | 0 refills | Status: AC

## 2017-06-02 NOTE — Progress Notes (Signed)
Pre visit review using our clinic review tool, if applicable. No additional management support is needed unless otherwise documented below in the visit note. 

## 2017-06-02 NOTE — Patient Instructions (Signed)
Continue to push fluids, practice good hand hygiene, and cover your mouth if you cough.  If you start having fevers, shaking or shortness of breath, seek immediate care.  Continue using home OTC meds that are working.  Let me know on Mon or Tues via MyChart if you are not getting better.

## 2017-06-02 NOTE — Progress Notes (Signed)
Chief Complaint  Patient presents with  . Sinusitis  . Ear Fullness    Manuel Landry here for URI complaints.  Duration: 10 days  Associated symptoms: sinus congestion, rhinorrhea, ear fullness, ear pain and cough Denies: sinus pain, itchy watery eyes, ear drainage, sore throat, shortness of breath, myalgia and fevers4 Treatment to date: Zyrtec Decongestant Sick contacts: No  ROS:  Const: Denies fevers HEENT: As noted in HPI Lungs: No SOB  Past Medical History:  Diagnosis Date  . Adenomatous colon polyp   . Allergy   . Anxiety   . Arthritis   . Backache, unspecified   . Barrett's esophagus    NOT A PROBLEM AT THE CURRENT TIME  . Blind loop syndrome   . BPH (benign prostatic hypertrophy)    DR. DAHLSTEDT  . Bulging discs    LOWER BACK  . Cataract   . Diverticulosis   . Family history of malignant neoplasm of gastrointestinal tract   . Glaucoma   . Hyperlipidemia   . IBS (irritable bowel syndrome)   . Stenosis of rectum and anus   . Unspecified gastritis and gastroduodenitis without mention of hemorrhage   . Vertigo    Family History  Problem Relation Age of Onset  . Colon cancer Mother 40  . Diabetes Maternal Grandmother   . COPD Sister   . Heart failure Father 64  . Esophageal cancer Neg Hx   . Rectal cancer Neg Hx   . Stomach cancer Neg Hx     BP 110/72 (BP Location: Left Arm, Patient Position: Sitting, Cuff Size: Normal)   Pulse 89   Temp 98.2 F (36.8 C) (Oral)   Ht 5\' 11"  (1.803 m)   Wt 181 lb 2 oz (82.2 kg)   SpO2 95%   BMI 25.26 kg/m  General: Awake, alert, appears stated age HEENT: AT, [name], ears patent b/l and TM's neg, nares patent w/o discharge, no ttp over sinuses b/l, pharynx pink and without exudates, MMM Neck: No masses or asymmetry Heart: RRR Lungs: CTAB, no accessory muscle use Psych: Age appropriate judgment and insight, normal mood and affect  URI with cough and congestion  Dysfunction of left eustachian tube - Plan:  predniSONE (DELTASONE) 20 MG tablet  Orders as above. Likely viral. Pred for ETD and pain. Cont supportive care.  Call Mon-Tues if no better. Will try Zpak.  Continue to push fluids, practice good hand hygiene, cover mouth when coughing. F/u prn. If starting to experience fevers, shaking, or shortness of breath, seek immediate care. Pt voiced understanding and agreement to the plan.  [place], DO 06/02/17 4:03 PM

## 2017-06-09 ENCOUNTER — Encounter: Payer: Self-pay | Admitting: Family Medicine

## 2017-06-21 ENCOUNTER — Ambulatory Visit (INDEPENDENT_AMBULATORY_CARE_PROVIDER_SITE_OTHER): Payer: Medicare Other | Admitting: Family Medicine

## 2017-06-21 ENCOUNTER — Encounter: Payer: Self-pay | Admitting: Family Medicine

## 2017-06-21 VITALS — BP 108/70 | HR 104 | Temp 98.6°F | Ht 71.5 in | Wt 178.5 lb

## 2017-06-21 DIAGNOSIS — J208 Acute bronchitis due to other specified organisms: Secondary | ICD-10-CM | POA: Diagnosis not present

## 2017-06-21 DIAGNOSIS — B9689 Other specified bacterial agents as the cause of diseases classified elsewhere: Secondary | ICD-10-CM | POA: Diagnosis not present

## 2017-06-21 MED ORDER — AZITHROMYCIN 250 MG PO TABS: ORAL_TABLET | ORAL | 0 refills | Status: DC

## 2017-06-21 NOTE — Patient Instructions (Addendum)
Continue to push fluids, practice good hand hygiene, and cover your mouth if you cough.  If you start having fevers, shaking or shortness of breath, seek immediate care.  Continue home medicines that are helpful.   Let us know if you need anything.

## 2017-06-21 NOTE — Progress Notes (Signed)
Pre visit review using our clinic review tool, if applicable. No additional management support is needed unless otherwise documented below in the visit note. 

## 2017-06-21 NOTE — Progress Notes (Signed)
Chief Complaint  Patient presents with  . Ear Pain  . Fatigue  . Nasal Congestion    Manuel Landry here for URI complaints.  Duration: 1 month  Associated symptoms: sinus congestion, sinus pain, ear fullness, wheezing, cough Denies: rhinorrhea, itchy watery eyes, ear pain, ear drainage, sore throat, shortness of breath, myalgia and fevers Treatment to date: Pred, Mucinex DM Sick contacts: No  ROS:  Const: Denies fevers HEENT: As noted in HPI Lungs: No SOB  Past Medical History:  Diagnosis Date  . Adenomatous colon polyp   . Allergy   . Anxiety   . Arthritis   . Backache, unspecified   . Barrett's esophagus    NOT A PROBLEM AT THE CURRENT TIME  . Blind loop syndrome   . BPH (benign prostatic hypertrophy)    DR. DAHLSTEDT  . Bulging discs    LOWER BACK  . Cataract   . Diverticulosis   . Family history of malignant neoplasm of gastrointestinal tract   . Glaucoma   . Hyperlipidemia   . IBS (irritable bowel syndrome)   . Stenosis of rectum and anus   . Unspecified gastritis and gastroduodenitis without mention of hemorrhage   . Vertigo    Family History  Problem Relation Age of Onset  . Colon cancer Mother 38  . Diabetes Maternal Grandmother   . COPD Sister   . Heart failure Father 35  . Esophageal cancer Neg Hx   . Rectal cancer Neg Hx   . Stomach cancer Neg Hx     BP 108/70 (BP Location: Left Arm, Patient Position: Sitting, Cuff Size: Normal)   Pulse (!) 104   Temp 98.6 F (37 C) (Oral)   Ht 5' 11.5" (1.816 m)   Wt 178 lb 8 oz (81 kg)   SpO2 94%   BMI 24.55 kg/m  General: Awake, alert, appears stated age HEENT: AT, [place], ears patent b/l and TM's neg, nares patent w/o discharge, pharynx pink and without exudates, MMM Neck: No masses or asymmetry Heart: RRR Lungs: CTAB, no accessory muscle use Psych: Age appropriate judgment and insight, normal mood and affect  Acute bacterial bronchitis - Plan: azithromycin (ZITHROMAX) 250 MG tablet  Orders as  above. Given duration, will tx.  Continue to push fluids, practice good hand hygiene, cover mouth when coughing. F/u prn. If starting to experience fevers, shaking, or shortness of breath, seek immediate care. Pt voiced understanding and agreement to the plan.  [place], DO 06/21/17 10:51 AM

## 2017-06-27 ENCOUNTER — Encounter: Payer: Self-pay | Admitting: Family Medicine

## 2017-06-28 ENCOUNTER — Other Ambulatory Visit: Payer: Self-pay | Admitting: Family Medicine

## 2017-06-28 MED ORDER — ALBUTEROL SULFATE HFA 108 (90 BASE) MCG/ACT IN AERS: 2.0000 | INHALATION_SPRAY | Freq: Four times a day (QID) | RESPIRATORY_TRACT | 0 refills | Status: DC | PRN

## 2017-06-28 NOTE — Progress Notes (Signed)
Inhaler called, pt notified via [place].

## 2017-07-01 ENCOUNTER — Encounter: Payer: Self-pay | Admitting: Family Medicine

## 2017-07-01 ENCOUNTER — Telehealth: Payer: Self-pay | Admitting: Family Medicine

## 2017-07-01 ENCOUNTER — Other Ambulatory Visit: Payer: Self-pay | Admitting: Family Medicine

## 2017-07-01 ENCOUNTER — Ambulatory Visit (INDEPENDENT_AMBULATORY_CARE_PROVIDER_SITE_OTHER): Payer: Medicare Other | Admitting: Family Medicine

## 2017-07-01 VITALS — BP 108/62 | HR 108 | Temp 98.1°F | Ht 71.0 in | Wt 177.0 lb

## 2017-07-01 DIAGNOSIS — R05 Cough: Secondary | ICD-10-CM

## 2017-07-01 DIAGNOSIS — R059 Cough, unspecified: Secondary | ICD-10-CM

## 2017-07-01 DIAGNOSIS — R000 Tachycardia, unspecified: Secondary | ICD-10-CM

## 2017-07-01 MED ORDER — HYDROCODONE-HOMATROPINE 5-1.5 MG/5ML PO SYRP: 5.0000 mL | ORAL_SOLUTION | Freq: Three times a day (TID) | ORAL | 0 refills | Status: DC | PRN

## 2017-07-01 MED ORDER — SPACER/AERO CHAMBER MOUTHPIECE MISC: 0 refills | Status: DC

## 2017-07-01 MED ORDER — NEBULIZER AIR TUBE/PLUGS MISC: 0 refills | Status: DC

## 2017-07-01 MED ORDER — BENZONATATE 100 MG PO CAPS: 100.0000 mg | ORAL_CAPSULE | Freq: Three times a day (TID) | ORAL | 0 refills | Status: DC | PRN

## 2017-07-01 MED ORDER — NEBULIZER COMPRESSOR MISC: 0 refills | Status: DC

## 2017-07-01 MED ORDER — IPRATROPIUM-ALBUTEROL 0.5-2.5 (3) MG/3ML IN SOLN
3.0000 mL | Freq: Once | RESPIRATORY_TRACT | Status: AC
Start: 2017-07-01 — End: 2017-07-01
  Administered 2017-07-01: 3 mL via RESPIRATORY_TRACT

## 2017-07-01 MED ORDER — ALBUTEROL SULFATE (2.5 MG/3ML) 0.083% IN NEBU: 2.5000 mg | INHALATION_SOLUTION | RESPIRATORY_TRACT | 1 refills | Status: DC | PRN

## 2017-07-01 NOTE — Progress Notes (Signed)
Chief Complaint  Patient presents with  . Follow-up    Manuel Landry here for URI complaints.  Duration: 6 weeks  Associated symptoms: cough, inability to take a deep breath Denies: sinus congestion, sinus pain, rhinorrhea, ear pain, ear drainage, sore throat, wheezing, myalgia and fevers  No hx of clot, +cramping in calves,  Treatment to date: Pred, Zpak, albuterol  ROS:  Const: Denies fevers  HEENT: As noted in HPI Lungs: +cough  Past Medical History:  Diagnosis Date  . Adenomatous colon polyp   . Allergy   . Anxiety   . Arthritis   . Backache, unspecified   . Barrett's esophagus    NOT A PROBLEM AT THE CURRENT TIME  . Blind loop syndrome   . BPH (benign prostatic hypertrophy)    DR. DAHLSTEDT  . Bulging discs    LOWER BACK  . Cataract   . Diverticulosis   . Family history of malignant neoplasm of gastrointestinal tract   . Glaucoma   . Hyperlipidemia   . IBS (irritable bowel syndrome)   . Stenosis of rectum and anus   . Unspecified gastritis and gastroduodenitis without mention of hemorrhage   . Vertigo    Family History  Problem Relation Age of Onset  . Colon cancer Mother 38  . Diabetes Maternal Grandmother   . COPD Sister   . Heart failure Father 10  . Esophageal cancer Neg Hx   . Rectal cancer Neg Hx   . Stomach cancer Neg Hx    Past Surgical History:  Procedure Laterality Date  . CHOLECYSTECTOMY    . COLONOSCOPY    . INGUINAL HERNIA REPAIR     x 2  . MANDIBLE SURGERY    . right foot surgery    . ROTATOR CUFF REPAIR Right   . SHOULDER OPEN ROTATOR CUFF REPAIR  03/17/2012   Procedure: ROTATOR CUFF REPAIR SHOULDER OPEN;  Surgeon: Johnn Hai, MD;  Location: [facility];  Service: Orthopedics;  Laterality: Left;  LEFT MINI OPEN ROTATOR CUFF REPAIR   . TONSILLECTOMY    . VASECTOMY     Allergies  Allergen Reactions  . Prednisone     Side effect, has handled other steroids OK    BP 108/62 (BP Location: Left Arm, Patient Position: Sitting,  Cuff Size: Normal)   Pulse (!) 108   Temp 98.1 F (36.7 C) (Oral)   Ht 5\' 11"  (1.803 m)   Wt 177 lb (80.3 kg)   SpO2 96%   BMI 24.69 kg/m  General: Awake, alert, appears stated age HEENT: AT, [place], ears patent b/l and TM's neg, nares patent w/o discharge, pharynx pink and without exudates, MMM Neck: No masses or asymmetry Heart: Reg rhythm, tachycardic, no LE edema Lungs: CTAB, no accessory muscle use  MSK: No TTP over calves b/l Psych: Age appropriate judgment and insight, normal mood and affect  Cough - Plan: benzonatate (TESSALON) 100 MG capsule, D-Dimer, Quantitative, HYDROcodone-homatropine (HYCODAN) 5-1.5 MG/5ML syrup  Tachycardia - Plan: D-Dimer, Quantitative  Orders as above. Duoneb tx to see if nebulization is helpful. He did feel better, will try to get him a nebulizer of his own. Could be post infectious cough, cough suppressants called in for symptomatic control of that.  Given perceived breathing restriction, tachycardia and cough, will ck D dimer.  Cont allergy tx. Warning s/s's regarding cough syrup given.  F/u prn. If starting to experience fevers, shaking, or shortness of breath, seek immediate care.  Pt voiced understanding and agreement to  the plan.  [place], DO 07/01/17 9:35 AM

## 2017-07-01 NOTE — Telephone Encounter (Signed)
Copied from [place] [id number]. Topic: Quick Communication - Rx Refill/Question >> Jul 01, 2017 11:19 AM Scherrie Gerlach wrote: Medication: the nebulizer is not that expensive at [facility].  Pt needs a written Rx to take to [facility]. Pt states he can pick up the Rx for the nebulizer, or whichever way is convenient to get to him.  In addition pt would like the nebulizer solution sent to  [id number] - [place], [place] [phone] ([phone] (Fax)

## 2017-07-01 NOTE — Patient Instructions (Addendum)
This could be related to a post infectious cough- something that lingers after an upper respiratory infection. This type of cough can last 4-6 weeks after the upper respiratory infection has passed.  OK to stop the albuterol inhaler.  Continue Zyrtec and the nasal spray.   We are checking a lab (D-dimer) that if neg will give [place] reassurance that a blood clot is not present. If positive, it does not suggest there is a clot, but merely we should follow up with more testing. If this is the case, I will be calling you with more instructions.  If you start having worsening symptoms or shortness of breath, seek immediate care.  Do not drink alcohol, do any illicit/street drugs, drive or do anything that requires alertness while on this medicine (Hycodan).    OK to use nebulizer every 4-6 hours as needed for breathing/cough.  Let us know if you need anything.

## 2017-07-01 NOTE — Progress Notes (Signed)
Pre visit review using our clinic review tool, if applicable. No additional management support is needed unless otherwise documented below in the visit note. 

## 2017-07-02 ENCOUNTER — Other Ambulatory Visit: Payer: Self-pay | Admitting: Family Medicine

## 2017-07-02 DIAGNOSIS — Z789 Other specified health status: Secondary | ICD-10-CM

## 2017-07-02 DIAGNOSIS — R7989 Other specified abnormal findings of blood chemistry: Secondary | ICD-10-CM

## 2017-07-02 DIAGNOSIS — Z0189 Encounter for other specified special examinations: Secondary | ICD-10-CM

## 2017-07-02 DIAGNOSIS — R000 Tachycardia, unspecified: Secondary | ICD-10-CM

## 2017-07-02 DIAGNOSIS — R05 Cough: Secondary | ICD-10-CM

## 2017-07-02 DIAGNOSIS — R059 Cough, unspecified: Secondary | ICD-10-CM

## 2017-07-02 LAB — D-DIMER, QUANTITATIVE (NOT AT ARMC): D DIMER QUANT: 1.06 ug{FEU}/mL — AB (ref <0.50)

## 2017-07-02 MED ORDER — ALBUTEROL SULFATE (2.5 MG/3ML) 0.083% IN NEBU: 2.5000 mg | INHALATION_SOLUTION | RESPIRATORY_TRACT | 1 refills | Status: DC | PRN

## 2017-07-02 NOTE — Telephone Encounter (Signed)
Resent again

## 2017-07-02 NOTE — Telephone Encounter (Signed)
Patient called inquiring about rx for Albuterol Neb. Patient advised that rx was sent electronically to pharmacy this am at 0724; but patient states that he just got off the phone with pharmacy and they don't have a prescription. Please advise.

## 2017-07-07 ENCOUNTER — Encounter: Payer: Self-pay | Admitting: Family Medicine

## 2017-07-07 ENCOUNTER — Other Ambulatory Visit (INDEPENDENT_AMBULATORY_CARE_PROVIDER_SITE_OTHER): Payer: Medicare Other

## 2017-07-07 ENCOUNTER — Other Ambulatory Visit: Payer: Self-pay | Admitting: Family Medicine

## 2017-07-07 DIAGNOSIS — Z01818 Encounter for other preprocedural examination: Secondary | ICD-10-CM

## 2017-07-07 DIAGNOSIS — N401 Enlarged prostate with lower urinary tract symptoms: Secondary | ICD-10-CM | POA: Diagnosis not present

## 2017-07-08 LAB — COMPREHENSIVE METABOLIC PANEL
ALBUMIN: 3.8 g/dL (ref 3.5–5.2)
ALT: 12 U/L (ref 0–53)
AST: 15 U/L (ref 0–37)
Alkaline Phosphatase: 48 U/L (ref 39–117)
BUN: 10 mg/dL (ref 6–23)
CALCIUM: 9.1 mg/dL (ref 8.4–10.5)
CHLORIDE: 97 meq/L (ref 96–112)
CO2: 28 meq/L (ref 19–32)
CREATININE: 0.98 mg/dL (ref 0.40–1.50)
GFR: 79.94 mL/min (ref >60.00)
Glucose, Bld: 99 mg/dL (ref 70–99)
Potassium: 4.4 mEq/L (ref 3.5–5.1)
SODIUM: 131 meq/L — AB (ref 135–145)
Total Bilirubin: 0.3 mg/dL (ref 0.2–1.2)
Total Protein: 6.8 g/dL (ref 6.0–8.3)

## 2017-07-09 ENCOUNTER — Encounter (HOSPITAL_BASED_OUTPATIENT_CLINIC_OR_DEPARTMENT_OTHER): Payer: Self-pay

## 2017-07-09 ENCOUNTER — Ambulatory Visit (HOSPITAL_BASED_OUTPATIENT_CLINIC_OR_DEPARTMENT_OTHER)
Admission: RE | Admit: 2017-07-09 | Discharge: 2017-07-09 | Disposition: A | Discharge status: Home / Self Care | Payer: Medicare Other | Source: Ambulatory Visit | Attending: Family Medicine | Admitting: Family Medicine

## 2017-07-09 ENCOUNTER — Encounter: Payer: Self-pay | Admitting: Family Medicine

## 2017-07-09 DIAGNOSIS — I700 Atherosclerosis of aorta: Secondary | ICD-10-CM | POA: Insufficient documentation

## 2017-07-09 DIAGNOSIS — R05 Cough: Secondary | ICD-10-CM | POA: Insufficient documentation

## 2017-07-09 DIAGNOSIS — J9811 Atelectasis: Secondary | ICD-10-CM | POA: Insufficient documentation

## 2017-07-09 DIAGNOSIS — R7989 Other specified abnormal findings of blood chemistry: Secondary | ICD-10-CM | POA: Diagnosis not present

## 2017-07-09 DIAGNOSIS — R059 Cough, unspecified: Secondary | ICD-10-CM

## 2017-07-09 DIAGNOSIS — R000 Tachycardia, unspecified: Secondary | ICD-10-CM | POA: Diagnosis not present

## 2017-07-09 MED ORDER — IOPAMIDOL (ISOVUE-370) INJECTION 76%
125.0000 mL | Freq: Once | INTRAVENOUS | Status: AC | PRN
  Administered 2017-07-09: 100 mL via INTRAVENOUS

## 2017-07-14 ENCOUNTER — Encounter: Payer: Self-pay | Admitting: Family Medicine

## 2017-07-14 DIAGNOSIS — R05 Cough: Secondary | ICD-10-CM

## 2017-07-14 DIAGNOSIS — R059 Cough, unspecified: Secondary | ICD-10-CM

## 2017-07-14 MED ORDER — HYDROCODONE-HOMATROPINE 5-1.5 MG/5ML PO SYRP: 5.0000 mL | ORAL_SOLUTION | Freq: Three times a day (TID) | ORAL | 0 refills | Status: DC | PRN

## 2017-07-15 ENCOUNTER — Telehealth: Payer: Self-pay | Admitting: *Deleted

## 2017-07-15 NOTE — Telephone Encounter (Signed)
Received Physician Orders from [facility] for [employer] supplies; forwarded to provider/SLS 02/28

## 2017-07-21 ENCOUNTER — Telehealth: Payer: Self-pay | Admitting: *Deleted

## 2017-07-21 NOTE — Telephone Encounter (Signed)
Received Physician Orders from [facility]; forwarded to provider/SLS 03/06

## 2017-08-09 DIAGNOSIS — C61 Malignant neoplasm of prostate: Secondary | ICD-10-CM | POA: Diagnosis not present

## 2017-08-09 DIAGNOSIS — R972 Elevated prostate specific antigen [PSA]: Secondary | ICD-10-CM | POA: Diagnosis not present

## 2017-08-13 ENCOUNTER — Other Ambulatory Visit: Payer: Self-pay | Admitting: Urology

## 2017-08-13 DIAGNOSIS — C61 Malignant neoplasm of prostate: Secondary | ICD-10-CM

## 2017-08-16 DIAGNOSIS — C61 Malignant neoplasm of prostate: Secondary | ICD-10-CM | POA: Diagnosis not present

## 2017-08-20 ENCOUNTER — Encounter (HOSPITAL_COMMUNITY)
Admission: RE | Admit: 2017-08-20 | Discharge: 2017-08-20 | Disposition: A | Discharge status: Home / Self Care | Payer: Medicare Other | Source: Ambulatory Visit | Attending: Urology | Admitting: Urology

## 2017-08-20 ENCOUNTER — Ambulatory Visit (HOSPITAL_COMMUNITY)
Admission: RE | Admit: 2017-08-20 | Discharge: 2017-08-20 | Disposition: A | Discharge status: Home / Self Care | Payer: Medicare Other | Source: Ambulatory Visit | Attending: Urology | Admitting: Urology

## 2017-08-20 DIAGNOSIS — C61 Malignant neoplasm of prostate: Secondary | ICD-10-CM | POA: Diagnosis not present

## 2017-08-20 MED ORDER — TECHNETIUM TC 99M MEDRONATE IV KIT
25.0000 | PACK | Freq: Once | INTRAVENOUS | Status: AC | PRN
  Administered 2017-08-20: 21 via INTRAVENOUS

## 2017-08-31 ENCOUNTER — Telehealth: Payer: Self-pay | Admitting: Medical Oncology

## 2017-08-31 ENCOUNTER — Encounter: Payer: Self-pay | Admitting: Medical Oncology

## 2017-08-31 NOTE — Telephone Encounter (Signed)
I called pt to introduce myself as the Prostate Nurse Navigator and the Coordinator of the Prostate Congress.  1. I confirmed with the patient he is aware of his referral to the clinic 09/07/17 arriving at 12:30pm.  2. I discussed the format of the clinic and the physicians he will be seeing that day.  3. I discussed where the clinic is located, [place] parking, registrations and how to contact me.  4. I confirmed his address and informed him I would be mailing a packet of information and forms to be completed. I asked him to bring them with him the day of his appointment.   He voiced understanding of the above. I asked him to call me if he has any questions or concerns regarding his appointments or the forms he needs to complete.

## 2017-09-06 ENCOUNTER — Encounter: Payer: Self-pay | Admitting: Radiation Oncology

## 2017-09-06 ENCOUNTER — Telehealth: Payer: Self-pay | Admitting: Medical Oncology

## 2017-09-06 DIAGNOSIS — C61 Malignant neoplasm of prostate: Secondary | ICD-10-CM

## 2017-09-06 HISTORY — DX: Malignant neoplasm of prostate: C61

## 2017-09-06 NOTE — Progress Notes (Signed)
GU Location of Tumor / Histology: prostatic adenocarcinoma  If Prostate Cancer, Gleason Score is (4 + 5) and PSA is (3.22) on 07/07/2017. Prostate volume: 24 grams.  Manuel Landry was referred by his PCP, Dr. Riki Sheer, to Dr. Diona Fanti for further evaluation of an enlarged prostate.   Biopsies of prostate (if applicable) revealed:    Past/Anticipated interventions by urology, if any: prostate biopsy, referral to [facility]  Past/Anticipated interventions by medical oncology, if any: no  Weight changes, if any: no  Bowel/Bladder complaints, if any: difficulty emptying his bladder completely, frequency, urgency, weak stream and nocturia x 3   Nausea/Vomiting, if any: no  Pain issues, if any:  Yes, low back  SAFETY ISSUES:  Prior radiation? no  Pacemaker/ICD? no  Possible current pregnancy? no  Is the patient on methotrexate? no  Current Complaints / other details:  72 year old male. Married. Retired.

## 2017-09-06 NOTE — Telephone Encounter (Signed)
Spoke with Mr. Jamesetta So to confirm appointment for [facility] arriving at 12:30pm. I reviewed the registration process, [place] parking, asked him to have lunch before arrival and to bring completed medical forms. He voiced understanding.

## 2017-09-07 ENCOUNTER — Encounter: Payer: Self-pay | Admitting: General Practice

## 2017-09-07 ENCOUNTER — Ambulatory Visit
Admission: RE | Admit: 2017-09-07 | Discharge: 2017-09-07 | Disposition: A | Discharge status: Home / Self Care | Payer: Medicare Other | Source: Ambulatory Visit | Attending: Radiation Oncology | Admitting: Radiation Oncology

## 2017-09-07 ENCOUNTER — Inpatient Hospital Stay: Payer: Medicare Other | Attending: Oncology | Admitting: Oncology

## 2017-09-07 ENCOUNTER — Encounter: Payer: Self-pay | Admitting: Medical Oncology

## 2017-09-07 ENCOUNTER — Encounter: Payer: Self-pay | Admitting: Radiation Oncology

## 2017-09-07 ENCOUNTER — Other Ambulatory Visit: Payer: Self-pay

## 2017-09-07 VITALS — BP 142/99 | HR 91 | Temp 98.3°F | Resp 18 | Ht 71.0 in | Wt 174.8 lb

## 2017-09-07 DIAGNOSIS — Z79899 Other long term (current) drug therapy: Secondary | ICD-10-CM | POA: Diagnosis not present

## 2017-09-07 DIAGNOSIS — K589 Irritable bowel syndrome without diarrhea: Secondary | ICD-10-CM | POA: Diagnosis not present

## 2017-09-07 DIAGNOSIS — R3915 Urgency of urination: Secondary | ICD-10-CM | POA: Insufficient documentation

## 2017-09-07 DIAGNOSIS — R351 Nocturia: Secondary | ICD-10-CM | POA: Insufficient documentation

## 2017-09-07 DIAGNOSIS — E785 Hyperlipidemia, unspecified: Secondary | ICD-10-CM | POA: Diagnosis not present

## 2017-09-07 DIAGNOSIS — R972 Elevated prostate specific antigen [PSA]: Secondary | ICD-10-CM | POA: Diagnosis not present

## 2017-09-07 DIAGNOSIS — R350 Frequency of micturition: Secondary | ICD-10-CM | POA: Diagnosis not present

## 2017-09-07 DIAGNOSIS — C61 Malignant neoplasm of prostate: Secondary | ICD-10-CM | POA: Insufficient documentation

## 2017-09-07 DIAGNOSIS — Z800 Family history of malignant neoplasm of digestive organs: Secondary | ICD-10-CM | POA: Insufficient documentation

## 2017-09-07 DIAGNOSIS — N400 Enlarged prostate without lower urinary tract symptoms: Secondary | ICD-10-CM | POA: Insufficient documentation

## 2017-09-07 DIAGNOSIS — K219 Gastro-esophageal reflux disease without esophagitis: Secondary | ICD-10-CM | POA: Diagnosis not present

## 2017-09-07 HISTORY — DX: Malignant neoplasm of prostate: C61

## 2017-09-07 NOTE — Consult Note (Signed)
[facility]     09/07/2017   --------------------------------------------------------------------------------   Trena Platt  MRN: [id number]  PRIMARY CARE:  Riki Sheer, MD  DOB: November 07, 1945, 72 year old Male  REFERRING:  Lillette Boxer. Diona Fanti, MD  SSN: -[id number] [id number]  PROVIDER:  Franchot Gallo, M.D.    TREATING:  Raynelle Bring, M.D.    LOCATION:  [facility] [phone]   --------------------------------------------------------------------------------   CC/HPI: CC: Prostate Cancer   Physician requesting consult: Dr. Franchot Gallo  PCP: Dr. Riki Sheer  Location of consult: [facility]   Mr. Manuel Landry is a 72 year old gentleman who presented to Dr. Diona Fanti with worsening LUTS despite maximal medical therapy with tamsulosin, finasteride, and daily Cialis. His PSA was found to have increased from a baseline of 1.6 to 2.65 and was 3.22 when rechecked. He was also noted to have an 8 mm left prostate mid nodule. Considering the concern of his elevated PSA when corrected for 5ARI therapy and abnormal DRE, he appropriate underwent a TRUS biopsy of the prostate on 08/09/17. This confirmed Gleason 4+5=9 adenocarcinoma in 6 out of 12 biopsy cores (all on the left). His staging studies were negative for obvious metastatic disease.   Family history: None.   Imaging studies:  08/16/17 - CT abdomen and pelvis: Negative for metastatic disease. He does have some enhancement of the left prostate consistent with his known disease.  08/20/17 - Bone scan: Negative.   PMH: He has a history of GERD and glaucoma.  PSH: He has had a laparoscopic cholecystectomy and bilateral inguinal hernia repair.   TNM stage: cT2a N0 M0  PSA: 3.22 (on 5ARI therapy)  Gleason score: 4+5=9  Biopsy (08/09/17): 6/12 cores positive  Left: L lateral apex (50%, 4+5=9), L apex (20%, 4+5=9), L lateral mid (90%, 4+5=9), L mid (40%,  4+5=9), L lateral base (90%, 4+4=8), L base (50%, 4+3=7)  Right: Benign  Prostate volume: 24.0 cc   Nomogram  OC disease: 22%  EPE: 76%  SVI: 23%  LNI: 26%  PFS (5 year, 10 year): 39%, 26%   Urinary function: IPSS is 20. He takes tamsulosin, finasteride, and Cialis. His most bothersome symptoms are frequency, urgency, nocturia, and weak stream. He was noted to have a median lobe on his ultrasound.  Erectile function: SHIM score is 18.     ALLERGIES: No Allergies    MEDICATIONS: Cialis 5 mg tablet 1 tablet PO Daily  Finasteride 5 mg tablet TAKE 1 TABLET DAILY AS DIRECTED.  Finasteride 5 mg tablet TAKE 1 TABLET DAILY AS DIRECTED.  Flomax 0.4 mg capsule 1 capsule PO Daily  Levaquin 750 mg tablet 1 tablet PO Morning of biopsy  Cyclobenzaprine Hcl 10 mg tablet 0 Oral  Ibuprofen 800 mg tablet 0 Oral  Magox 400  Move Free Joint Health  Ocuvite With Lutein 1,000 unit-200 mg-60 unit-40 mg-55 mcg-2 mg-2 mg tablet Oral  Prevagen CAPS Oral  Ultimate Flora  Voltaren 1 % gel  Zyrtec 10 mg tablet 0 Oral     GU PSH: Locm 300-399Mg /Ml Iodine,1Ml - 08/16/2017 Prostate Needle Biopsy - 08/09/2017 Vasectomy - 2011      PSH Notes: Foot Surgery, Shoulder Surgery, Cholecystectomy, Surgery Of Male Genitalia Vasectomy, Shoulder Surgery, Hernia Repair, Oral Surgery   NON-GU PSH: Cholecystectomy (open) - 2012 Hernia Repair - 2011 Surgical Pathology, Gross And Microscopic Examination For Prostate Needle - 08/09/2017    GU PMH: Elevated PSA (Stable), Elevated PSA with prostate nodule  oon the left, with radiographic - 08/09/2017 BPH w/LUTS (Stable), Symptoms still moderate despite being on 3 medications. He is interested in Urolift - 05/26/2017, (Worsening), Urinary symptomatology is getting worse despite being on 2 medications., - 03/10/2017, - 06/01/2016 Prostate nodule w/ LUTS, He does have a fairly rubbery//soft nodule in his left prostatic lobe. Not documented before but this does not feel  significantly suspicious. However, he did have a PSA bump - 05/26/2017 ED due to arterial insufficiency, Erectile dysfunction due to arterial insufficiency - 2017 Nocturia, Nocturia - 2017 Spermatocele of epididymis, Unspec, Spermatocele - 2014    NON-GU PMH: Encounter for general adult medical examination without abnormal findings, Encounter for preventive health examination - 2015 Personal history of other diseases of the digestive system, History of esophageal reflux - 2014 Personal history of other diseases of the nervous system and sense organs, History of glaucoma - 2014    FAMILY HISTORY: Colon Cancer - Mother Death In The Family Father - Mother Death In The Family Mother - Mother Diabetes - Mother   SOCIAL HISTORY: No Social History     Notes: Never A Smoker, Retired From Work, Caffeine Use, Marital History - Currently Married, Alcohol Use   REVIEW OF SYSTEMS:    GU Review Male:   Patient denies frequent urination, hard to postpone urination, burning/ pain with urination, get up at night to urinate, leakage of urine, stream starts and stops, trouble starting your streams, and have to strain to urinate .  Gastrointestinal (Upper):   Patient denies nausea and vomiting.  Gastrointestinal (Lower):   Patient denies diarrhea and constipation.  Constitutional:   Patient denies fever, night sweats, weight loss, and fatigue.  Skin:   Patient denies skin rash/ lesion and itching.  Eyes:   Patient denies blurred vision and double vision.  Ears/ Nose/ Throat:   Patient denies sore throat and sinus problems.  Hematologic/Lymphatic:   Patient denies swollen glands and easy bruising.  Cardiovascular:   Patient denies leg swelling and chest pains.  Respiratory:   Patient denies cough and shortness of breath.  Endocrine:   Patient denies excessive thirst.  Musculoskeletal:   Patient denies back pain and joint pain.  Neurological:   Patient denies headaches and dizziness.  Psychologic:   Patient  denies depression and anxiety.   VITAL SIGNS: None   GU PHYSICAL EXAMINATION:    Prostate: He has significant nodularity of the left lobe of the prostate with what would appear to be extraprostatic extension extensively.   MULTI-SYSTEM PHYSICAL EXAMINATION:    Constitutional: Well-nourished. No physical deformities. Normally developed. Good grooming.  Neck: Neck symmetrical, not swollen. Normal tracheal position.  Respiratory: No labored breathing, no use of accessory muscles. Normal breath sounds. Clear bilaterally  Cardiovascular: Regular rate and rhythm. No murmur, no gallop. Normal temperature, normal extremity pulses, no swelling, no varicosities.  Lymphatic: No enlargement of neck, axillae, groin.  Skin: No paleness, no jaundice, no cyanosis. No lesion, no ulcer, no rash.  Neurologic / Psychiatric: Oriented to time, oriented to place, oriented to person. No depression, no anxiety, no agitation.  Gastrointestinal: No mass, no tenderness, no rigidity, non obese abdomen.  Eyes: Normal conjunctivae. Normal eyelids.  Ears, Nose, Mouth, and Throat: Left ear no scars, no lesions, no masses. Right ear no scars, no lesions, no masses. Nose no scars, no lesions, no masses. Normal hearing. Normal lips.  Musculoskeletal: Normal gait and station of head and neck.     PAST DATA REVIEWED:  Source Of History:  Patient  Lab Test Review:   PSA  Records Review:   Pathology Reports, Previous Patient Records  Urine Test Review:   Urinalysis   07/07/17 05/20/17 06/01/16 08/14/12 07/15/10  PSA  Total PSA 3.22 ng/mL 2.65 ng/mL 0.86 ng/dl 1.00  1.02     PROCEDURES: None   ASSESSMENT:      ICD-10 Details  1 GU:   Prostate Cancer - C61    PLAN:           Document Letter(s):  Created for Patient: Clinical Summary         Notes:   1. Locally advanced prostate cancer: I had a long discussion with Mr. Klem and his wife today. The patient was counseled about the natural history of prostate  cancer and the standard treatment options that are available for prostate cancer. It was explained to him how his age and life expectancy, clinical stage, Gleason score, and PSA affect his prognosis, the decision to proceed with additional staging studies, as well as how that information influences recommended treatment strategies. We discussed the roles for active surveillance, radiation therapy, surgical therapy, androgen deprivation, as well as ablative therapy options for the treatment of prostate cancer as appropriate to his individual cancer situation. We discussed the risks and benefits of these options with regard to their impact on cancer control and also in terms of potential adverse events, complications, and impact on quality of life particularly related to urinary and sexual function. The patient was encouraged to ask questions throughout the discussion today and all questions were answered to his stated satisfaction. In addition, the patient was provided with and/or directed to appropriate resources and literature for further education about prostate cancer and treatment options.   I did recommend therapy of curative intent with either androgen deprivation and radiation therapy versus primary surgical therapy likely in the setting multimodality therapy. We reviewed the pros and cons of each of these approaches. He appears to be leaning towards surgical therapy although would like to continue to discuss his options.   We discussed surgical therapy for prostate cancer including the different available surgical approaches. We discussed, in detail, the risks and expectations of surgery with regard to cancer control, urinary control, and erectile function as well as the expected postoperative recovery process. Additional risks of surgery including but not limited to bleeding, infection, hernia formation, nerve damage, lymphocele formation, bowel/rectal injury potentially necessitating colostomy, damage  to the urinary tract resulting in urine leakage, urethral stricture, and the cardiopulmonary risks such as myocardial infarction, stroke, death, venothromboembolism, etc. were explained. The risk of open surgical conversion for robotic/laparoscopic prostatectomy was also discussed.   My tentative plan would be to perform a unilateral right nerve-sparing robot assisted laparoscopic radical prostatectomy and bilateral pelvic lymphadenectomy if he did proceed with surgical therapy. If he chooses to proceed with primary radiation therapy, he may benefit from treatment of his median lobe with transurethral resection prior to radiation therapy. He is going to consider his options.   CC: Dr. Franchot Gallo  Dr. Riki Sheer  Dr. Tyler Pita  Dr. Zola Button     E & M CODE: I spent at least 68 minutes face to face with the patient, more than 50% of that time was spent on counseling and/or coordinating care.

## 2017-09-07 NOTE — Progress Notes (Signed)
Reason for Referral: Prostate cancer  HPI: 72 year old gentleman currently resides in [place] after living in multiple locations including [place].  He is a pleasant gentleman history of hyperlipidemia and BPH that has followed with Dr. Diona Fanti for this issue.  He was then noted to have a an elevated PSA while on BPH therapy up to 3.2 to from 1.6.  A prostate biopsy obtained in March 2019 which showed Gleason score 4+5 = 9 and 4 cores and Gleason 8 in one core and a Gleason score 4+3 = 7 in another core.  Staging workup including CT scan and a bone scan was obtained and showed no evidence of metastatic disease.  He continues to have symptoms of frequency or nocturia but no hematuria or dysuria.  No other constitutional symptoms at this time and he remains in excellent health and shape.  He does not report any headaches, blurry vision, syncope or seizures. Does not report any fevers, chills or sweats.  Does not report any cough, wheezing or hemoptysis.  Does not report any chest pain, palpitation, orthopnea or leg edema.  Does not report any nausea, vomiting or abdominal pain.  Does not report any constipation or diarrhea.  Does not report any skeletal complaints.    Does not report frequency, urgency or hematuria.  Does not report any skin rashes or lesions. Does not report any heat or cold intolerance.  Does not report any lymphadenopathy or petechiae.  Does not report any anxiety or depression.  Remaining review of systems is negative.    Past Medical History:  Diagnosis Date  . Adenomatous colon polyp   . Allergy   . Anxiety   . Arthritis   . Backache, unspecified   . Barrett's esophagus    NOT A PROBLEM AT THE CURRENT TIME  . Blind loop syndrome   . BPH (benign prostatic hypertrophy)    DR. DAHLSTEDT  . Bulging discs    LOWER BACK  . Cataract   . Diverticulosis   . Family history of malignant neoplasm of gastrointestinal tract   . Glaucoma   . Hyperlipidemia   . IBS  (irritable bowel syndrome)   . Malignant neoplasm of prostate ([place]) 09/06/2017  . Stenosis of rectum and anus   . Unspecified gastritis and gastroduodenitis without mention of hemorrhage   . Vertigo   :  Past Surgical History:  Procedure Laterality Date  . CHOLECYSTECTOMY    . COLONOSCOPY    . INGUINAL HERNIA REPAIR     x 2  . MANDIBLE SURGERY    . right foot surgery    . ROTATOR CUFF REPAIR Right   . SHOULDER OPEN ROTATOR CUFF REPAIR  03/17/2012   Procedure: ROTATOR CUFF REPAIR SHOULDER OPEN;  Surgeon: Johnn Hai, MD;  Location: [facility];  Service: Orthopedics;  Laterality: Left;  LEFT MINI OPEN ROTATOR CUFF REPAIR   . TONSILLECTOMY    . VASECTOMY    :   Current Outpatient Medications:  .  albuterol (PROVENTIL) (2.5 MG/3ML) 0.083% nebulizer solution, Take 3 mLs (2.5 mg total) by nebulization every 4 (four) hours as needed for wheezing or shortness of breath (4-6 hours as needed.)., Disp: 150 mL, Rfl: 1 .  ALBUTEROL SULFATE IN, TAKE 2 PUFFS BY MOUTH EVERY 6 HOURS AS NEEDED FOR WHEEZE OR SHORTNESS OF BREATH, Disp: , Rfl: 0 .  Apoaequorin (PREVAGEN) 10 MG CAPS, Take 1 capsule by mouth daily., Disp: , Rfl:  .  azelastine (ASTELIN) 137 MCG/SPRAY nasal spray, Place  1 spray into the nose. Pt states his spray is 0.1% and he only uses nasal spray if needed., Disp: , Rfl:  .  benzonatate (TESSALON) 100 MG capsule, Take 1 capsule (100 mg total) by mouth 3 (three) times daily as needed., Disp: 30 capsule, Rfl: 0 .  cetirizine (ZYRTEC) 10 MG chewable tablet, Chew 10 mg by mouth as needed., Disp: , Rfl:  .  cyclobenzaprine (FLEXERIL) 10 MG tablet, Take 1 tablet (10 mg total) by mouth 3 (three) times daily as needed for muscle spasms., Disp: 60 tablet, Rfl: 0 .  diclofenac sodium (VOLTAREN) 1 % GEL, Apply 1 g topically 2 (two) times daily. To painful areas on shoulders, Disp: , Rfl:  .  finasteride (PROSCAR) 5 MG tablet, Take 5 mg by mouth daily., Disp: , Rfl:  .  Glucosamine-Chondroitin (MOVE  FREE PO), Take 1 tablet by mouth daily., Disp: , Rfl:  .  HYDROcodone-homatropine (HYCODAN) 5-1.5 MG/5ML syrup, Take 5 mLs by mouth every 8 (eight) hours as needed for cough., Disp: 120 mL, Rfl: 0 .  ibuprofen (ADVIL,MOTRIN) 800 MG tablet, Take 800 mg by mouth every 8 (eight) hours as needed. For pain, Disp: , Rfl:  .  Krill Oil 300 MG CAPS, Take by mouth., Disp: , Rfl:  .  Lactobacillus (DIGESTIVE HEALTH PROBIOTIC PO), Take 1 capsule by mouth daily., Disp: , Rfl:  .  magnesium oxide (MAG-OX) 400 MG tablet, Take 400 mg by mouth as needed., Disp: , Rfl:  .  Multiple Vitamins-Minerals (OCUVITE ADULT 50+) CAPS, Take 1 capsule by mouth daily., Disp: , Rfl:  .  Nebulizers (NEBULIZER COMPRESSOR) MISC, Use with albuterol every 4-6 hours as needed for shortness of breath or cough., Disp: 1 each, Rfl: 0 .  Respiratory Therapy Supplies (NEBULIZER AIR TUBE/PLUGS) MISC, Use with albuterol every 4-6 hours as needed for shortness of breath or cough., Disp: 1 each, Rfl: 0 .  Spacer/Aero Chamber Mouthpiece MISC, Use with albuterol every 4-6 hours as needed for cough/shortness of breath., Disp: 1 each, Rfl: 0 .  tadalafil (CIALIS) 5 MG tablet, Take 5 mg by mouth daily as needed for erectile dysfunction., Disp: , Rfl:  .  tamsulosin (FLOMAX) 0.4 MG CAPS capsule, Take 0.4 mg by mouth., Disp: , Rfl:   Current Facility-Administered Medications:  .  0.9 %  sodium chloride infusion, 500 mL, Intravenous, Continuous, Pyrtle, Lajuan Lines, MD:  Allergies  Allergen Reactions  . Prednisone     Side effect, has handled other steroids OK  :  Family History  Problem Relation Age of Onset  . Colon cancer Mother 58  . Diabetes Maternal Grandmother   . COPD Sister   . Heart failure Father 71  . Esophageal cancer Neg Hx   . Rectal cancer Neg Hx   . Stomach cancer Neg Hx   :  Social History   Socioeconomic History  . Marital status: Married    Spouse name: Not on file  . Number of children: 0  . Years of education: Not  on file  . Highest education level: Not on file  Occupational History  . Occupation: Retired    Fish farm manager: RETIRED  Social Needs  . Financial resource strain: Not on file  . Food insecurity:    Worry: Not on file    Inability: Not on file  . Transportation needs:    Medical: Not on file    Non-medical: Not on file  Tobacco Use  . Smoking status: Never Smoker  . Smokeless tobacco:  Never Used  Substance and Sexual Activity  . Alcohol use: Yes    Alcohol/week: 3.5 oz    Types: 7 drink(s) per week    Comment: 1 day  . Drug use: No  . Sexual activity: Not on file  Lifestyle  . Physical activity:    Days per week: Not on file    Minutes per session: Not on file  . Stress: Not on file  Relationships  . Social connections:    Talks on phone: Not on file    Gets together: Not on file    Attends religious service: Not on file    Active member of club or organization: Not on file    Attends meetings of clubs or organizations: Not on file    Relationship status: Not on file  . Intimate partner violence:    Fear of current or ex partner: Not on file    Emotionally abused: Not on file    Physically abused: Not on file    Forced sexual activity: Not on file  Other Topics Concern  . Not on file  Social History Narrative  . Not on file  :  Pertinent items are noted in HPI.  Exam: ECOG 0 General appearance: alert and cooperative appeared without distress. Head: atraumatic without any abnormalities. Eyes: conjunctivae/corneas clear. PERRL.  Sclera anicteric. Throat: lips, mucosa, and tongue normal; without oral thrush or ulcers. Resp: clear to auscultation bilaterally without rhonchi, wheezes or dullness to percussion. Cardio: regular rate and rhythm, S1, S2 normal, no murmur, click, rub or gallop GI: soft, non-tender; bowel sounds normal; no masses,  no organomegaly Skin: Skin color, texture, turgor normal. No rashes or lesions Lymph nodes: Cervical, supraclavicular, and  axillary nodes normal. Neurologic: Grossly normal without any motor, sensory or deep tendon reflexes. Musculoskeletal: No joint deformity or effusion.  CBC    Component Value Date/Time   WBC 5.3 02/04/2017 0745   RBC 4.84 02/04/2017 0745   HGB 14.8 02/04/2017 0745   HCT 44.2 02/04/2017 0745   PLT 238.0 02/04/2017 0745   MCV 91.3 02/04/2017 0745   MCHC 33.5 02/04/2017 0745   RDW 13.9 02/04/2017 0745   LYMPHSABS 1.0 02/13/2010 1311   MONOABS 0.5 02/13/2010 1311   EOSABS 0.2 02/13/2010 1311   BASOSABS 0.0 02/13/2010 1311     Chemistry      Component Value Date/Time   NA 131 (L) 07/07/2017 1452   K 4.4 07/07/2017 1452   CL 97 07/07/2017 1452   CO2 28 07/07/2017 1452   BUN 10 07/07/2017 1452   CREATININE 0.98 07/07/2017 1452      Component Value Date/Time   CALCIUM 9.1 07/07/2017 1452   ALKPHOS 48 07/07/2017 1452   AST 15 07/07/2017 1452   ALT 12 07/07/2017 1452   BILITOT 0.3 07/07/2017 1452       Nm Bone Scan Whole Body  Result Date: 08/20/2017 CLINICAL DATA:  New diagnosis prostate cancer EXAM: NUCLEAR MEDICINE WHOLE BODY BONE SCAN TECHNIQUE: Whole body anterior and posterior images were obtained approximately 3 hours after intravenous injection of radiopharmaceutical. RADIOPHARMACEUTICALS:  21 mCi Technetium-26m MDP IV COMPARISON:  Abdominal CT 4 days prior. FINDINGS: Negative for metastatic pattern. There is degenerative type uptake at the base of thumbs and shoulders. Symmetric renal activity. Bladder activity present. IMPRESSION: Negative for metastatic pattern. Electronically Signed   By: Monte Fantasia M.D.   On: 08/20/2017 16:02    Assessment and Plan:   72 year old gentleman with the prostate cancer diagnosed  in March 2019 with a PSA of 3.22 and found to have a Gleason score 4+5 = 9 and 4 cores.  His case was discussed today in the prostate cancer multidisciplinary clinic including pathology and radiology review.  The natural course of this disease and treatment  options were discussed today with the patient.  He would be a candidate for primary surgical therapy versus definitive radiation therapy with androgen deprivation.  The rationale for using both approaches were reviewed today as well as potential complications.  Both of those options are for equivalent chance of disease control long-term.  The complication associated with androgen deprivation were reviewed today including hot flashes, weight gain, sexual dysfunction among others.  There is no oral for additional systemic therapy at this point unless he develops advanced disease.  He understands his best chance of disease control and potential cure is with organ confined prostate cancer.  His disease metastasized, he understands treatment options do exist but they are more palliative in nature.  He will discuss his options further with his family and make a decision in the near future but he is leaning towards proceeding with radiation as a definitive option.  30  minutes was spent with the patient face-to-face today.  More than 50% of time was dedicated to patient counseling, education and coordination of his care.

## 2017-09-07 NOTE — Progress Notes (Signed)
Radiation Oncology         [phone] ________________________________  Multidisciplinary Prostate Cancer Clinic  Initial Radiation Oncology Consultation  Name: Manuel Landry MRN: [id number]  Date: 09/07/2017  DOB: [birth year]  YP:[id number], Crosby Oyster, DO  Raynelle Bring, MD   REFERRING PHYSICIAN: Raynelle Bring, MD  DIAGNOSIS: 72 y.o. gentleman with stage T2a-b adenocarcinoma of the prostate with a Gleason's score of 4+5 and a PSA of 3.22 (on Finasteride)    ICD-10-CM   1. Malignant neoplasm of prostate ([place]) C61     HISTORY OF PRESENT ILLNESS::Manuel Landry is a 72 y.o. gentleman with a history of enlarged prostate treated with Finasteride and Flomax.  He was noted to have an elevated PSA of 2.65, with finasteride correction suggesting 5.3.      Accordingly, he was seen for evaluation in urology to Dr. Diona Fanti on 05/26/17, where a digital rectal examination was performed at that time revealing a 50 gm gland with an 8 mm nodule in the left mid gland.  Repeat PSA on 07/07/17 was 3.22.  The patient proceeded to transrectal ultrasound with 12 biopsies of the prostate on 08/09/17.  The prostate volume measured 24 cc.  Out of 12 core biopsies, 6 were positive all on the left side.  The maximum Gleason score was 4+5, and this was seen in 4 of the cores on the left.   The patient reviewed the biopsy results with his urologist.  CT Pelvis on 08/16/17 showed no evidence of lymphadenopathy or metastases. Bone scan on 08/20/17 showed no evidence of metastasis.  Today, he has kindly been referred today to the multidisciplinary prostate cancer clinic for presentation of pathology and radiology studies in our conference for discussion of potential radiation treatment options and clinical evaluation.   On review of systems, the patient reports arthritis and lower back pain.   PREVIOUS RADIATION THERAPY: No  PAST MEDICAL HISTORY:  has a past medical history of Adenomatous colon polyp,  Allergy, Anxiety, Arthritis, Backache, unspecified, Barrett's esophagus, Blind loop syndrome, BPH (benign prostatic hypertrophy), Bulging discs, Cataract, Diverticulosis, Family history of malignant neoplasm of gastrointestinal tract, Glaucoma, Hyperlipidemia, IBS (irritable bowel syndrome), Malignant neoplasm of prostate ([place]) (09/06/2017), Stenosis of rectum and anus, Unspecified gastritis and gastroduodenitis without mention of hemorrhage, and Vertigo.    PAST SURGICAL HISTORY: Past Surgical History:  Procedure Laterality Date  . CHOLECYSTECTOMY    . COLONOSCOPY    . INGUINAL HERNIA REPAIR     x 2  . MANDIBLE SURGERY    . right foot surgery    . ROTATOR CUFF REPAIR Right   . SHOULDER OPEN ROTATOR CUFF REPAIR  03/17/2012   Procedure: ROTATOR CUFF REPAIR SHOULDER OPEN;  Surgeon: Johnn Hai, MD;  Location: [facility];  Service: Orthopedics;  Laterality: Left;  LEFT MINI OPEN ROTATOR CUFF REPAIR   . TONSILLECTOMY    . VASECTOMY      FAMILY HISTORY: family history includes COPD in his sister; Colon cancer (age of onset: 46) in his mother; Diabetes in his maternal grandmother; Heart failure (age of onset: 63) in his father.  SOCIAL HISTORY:  reports that he has never smoked. He has never used smokeless tobacco. He reports that he drinks about 3.5 oz of alcohol per week. He reports that he does not use drugs.  ALLERGIES: Prednisone  MEDICATIONS:  Current Outpatient Medications  Medication Sig Dispense Refill  . albuterol (PROVENTIL) (2.5 MG/3ML) 0.083% nebulizer solution Take 3 mLs (2.5 mg total) by nebulization every 4 (  four) hours as needed for wheezing or shortness of breath (4-6 hours as needed.). 150 mL 1  . ALBUTEROL SULFATE IN TAKE 2 PUFFS BY MOUTH EVERY 6 HOURS AS NEEDED FOR WHEEZE OR SHORTNESS OF BREATH  0  . Apoaequorin (PREVAGEN) 10 MG CAPS Take 1 capsule by mouth daily.    Marland Kitchen azelastine (ASTELIN) 137 MCG/SPRAY nasal spray Place 1 spray into the nose. Pt states his spray is 0.1%  and he only uses nasal spray if needed.    . benzonatate (TESSALON) 100 MG capsule Take 1 capsule (100 mg total) by mouth 3 (three) times daily as needed. 30 capsule 0  . cetirizine (ZYRTEC) 10 MG chewable tablet Chew 10 mg by mouth as needed.    . cyclobenzaprine (FLEXERIL) 10 MG tablet Take 1 tablet (10 mg total) by mouth 3 (three) times daily as needed for muscle spasms. 60 tablet 0  . diclofenac sodium (VOLTAREN) 1 % GEL Apply 1 g topically 2 (two) times daily. To painful areas on shoulders    . finasteride (PROSCAR) 5 MG tablet Take 5 mg by mouth daily.    . Glucosamine-Chondroitin (MOVE FREE PO) Take 1 tablet by mouth daily.    Marland Kitchen HYDROcodone-homatropine (HYCODAN) 5-1.5 MG/5ML syrup Take 5 mLs by mouth every 8 (eight) hours as needed for cough. 120 mL 0  . ibuprofen (ADVIL,MOTRIN) 800 MG tablet Take 800 mg by mouth every 8 (eight) hours as needed. For pain    . Krill Oil 300 MG CAPS Take by mouth.    . Lactobacillus (DIGESTIVE HEALTH PROBIOTIC PO) Take 1 capsule by mouth daily.    . magnesium oxide (MAG-OX) 400 MG tablet Take 400 mg by mouth as needed.    . Multiple Vitamins-Minerals (OCUVITE ADULT 50+) CAPS Take 1 capsule by mouth daily.    . Nebulizers (NEBULIZER COMPRESSOR) MISC Use with albuterol every 4-6 hours as needed for shortness of breath or cough. 1 each 0  . Respiratory Therapy Supplies (NEBULIZER AIR TUBE/PLUGS) MISC Use with albuterol every 4-6 hours as needed for shortness of breath or cough. 1 each 0  . Spacer/Aero Chamber Mouthpiece MISC Use with albuterol every 4-6 hours as needed for cough/shortness of breath. 1 each 0  . tadalafil (CIALIS) 5 MG tablet Take 5 mg by mouth daily as needed for erectile dysfunction.    . tamsulosin (FLOMAX) 0.4 MG CAPS capsule Take 0.4 mg by mouth.     Current Facility-Administered Medications  Medication Dose Route Frequency Provider Last Rate Last Dose  . 0.9 %  sodium chloride infusion  500 mL Intravenous Continuous Pyrtle, Lajuan Lines, MD          REVIEW OF SYSTEMS:  A 15 point review of systems is documented in the electronic medical record. This was obtained by the nursing staff. However, I reviewed this with the patient to discuss relevant findings and make appropriate changes.  Pertinent items noted in HPI and remainder of comprehensive ROS otherwise negative.  The patient completed an IPSS and IIEF questionnaire.  His IPSS score was 20 indicating severe urinary outflow obstructive symptoms of frequency, urgency, weak stream, and nocturia x4.  He indicated that his erectile function is able to complete sexual activity most times.   PHYSICAL EXAM: This patient is in no acute distress.  He is alert and oriented.   vitals were not taken for this visit.  He exhibits no respiratory distress or labored breathing.  He appears neurologically intact.  His mood is pleasant.  His  affect is appropriate.  Please note the digital rectal exam findings described above.  KPS = 100  100 - Normal; no complaints; no evidence of disease. 90   - Able to carry on normal activity; minor signs or symptoms of disease. 80   - Normal activity with effort; some signs or symptoms of disease. 59   - Cares for self; unable to carry on normal activity or to do active work. 60   - Requires occasional assistance, but is able to care for most of his personal needs. 50   - Requires considerable assistance and frequent medical care. 53   - Disabled; requires special care and assistance. 81   - Severely disabled; hospital admission is indicated although death not imminent. 44   - Very sick; hospital admission necessary; active supportive treatment necessary. 10   - Moribund; fatal processes progressing rapidly. 0     - Dead  Karnofsky DA, Abelmann [place], Craver LS and Burchenal [facility] [phone]) The use of the nitrogen mustards in the palliative treatment of carcinoma: with particular reference to bronchogenic carcinoma Cancer 1 634-56   LABORATORY DATA:  Lab Results   Component Value Date   WBC 5.3 02/04/2017   HGB 14.8 02/04/2017   HCT 44.2 02/04/2017   MCV 91.3 02/04/2017   PLT 238.0 02/04/2017   Lab Results  Component Value Date   NA 131 (L) 07/07/2017   K 4.4 07/07/2017   CL 97 07/07/2017   CO2 28 07/07/2017   Lab Results  Component Value Date   ALT 12 07/07/2017   AST 15 07/07/2017   ALKPHOS 48 07/07/2017   BILITOT 0.3 07/07/2017     RADIOGRAPHY: Nm Bone Scan Whole Body  Result Date: 08/20/2017 CLINICAL DATA:  New diagnosis prostate cancer EXAM: NUCLEAR MEDICINE WHOLE BODY BONE SCAN TECHNIQUE: Whole body anterior and posterior images were obtained approximately 3 hours after intravenous injection of radiopharmaceutical. RADIOPHARMACEUTICALS:  21 mCi Technetium-54m MDP IV COMPARISON:  Abdominal CT 4 days prior. FINDINGS: Negative for metastatic pattern. There is degenerative type uptake at the base of thumbs and shoulders. Symmetric renal activity. Bladder activity present. IMPRESSION: Negative for metastatic pattern. Electronically Signed   By: Monte Fantasia M.D.   On: 08/20/2017 16:02      IMPRESSION: This is a 72 y.o. gentleman with stage T2a-b adenocarcinoma of the prostate with a Gleason's score of 4+5 and a PSA of 3.22 (on Finasteride).  His T-Stage, Gleason's Score, and PSA put him into the high risk group.  Accordingly he is eligible for a variety of potential treatment options including TURP with LT-ADT and IMRT.  PLAN: Today I reviewed the findings and workup thus far.  We discussed the natural history of prostate cancer.  We reviewed the the implications of T-stage, Gleason's Score, and PSA on decision-making and outcomes in prostate cancer.  We discussed radiation treatment in the management of prostate cancer with regard to the logistics and delivery of external beam radiation treatment. The patient expressed interest in external beam radiotherapy.   The patient would like to consider his treatment options at this time but is  leaning towards prostate IMRT.  I will share my findings with Dr. [name] and upon the patient's decision for IMRT, we will move forward with scheduling placement of three gold fiducial markers into the prostate to proceed with IMRT in the near future.     I enjoyed meeting with him today, and will look forward to participating in the care of this very  nice gentleman.   I spent time face to face with the patient and more than 50% of that time was spent in counseling and/or coordination of care.   ------------------------------------------------  Sheral Apley. Tammi Klippel, M.D.  This document serves as a record of services personally performed by Tyler Pita, MD. It was created on his behalf by Rae Lips, a trained medical scribe. The creation of this record is based on the scribe's personal observations and the provider's statements to them. This document has been checked and approved by the attending provider.

## 2017-09-07 NOTE — Progress Notes (Signed)
                               Care Plan Summary  Name: [name] DOB: [birth year]   Your Medical Team:   Urologist -  Dr. Raynelle Bring, [facility]  Radiation Oncologist - Dr. Tyler Pita, [facility]   Medical Oncologist - Dr. Zola Button, [place]  Recommendations: 1) Prostatectomy 2) Androgen deprivation(hormone injections) 3) Raditaion  * These recommendations are based on information available as of today's consult.      Recommendations may change depending on the results of further tests or exams.    Next Steps: 1) TURP- Dr. Alan Ripper office will schedule 2) Dr. Alan Ripper office will schedule hormone injection 3) Dr. Dahlstedt'swill schedule gold markers/[facility] 4) Dr. Johny Shears office will schedule radiation planning and treatments  When appointments need to be scheduled, you will be contacted by [facility] and/or [facility].  Patient provided with business card for all team members and a copy of "Fall Prevention Patient Safety Sheet".  Questions?  Please do not hesitate to call Cira Rue, RN, BSN, OCN at [phone] any questions or concerns.  Shirlean Mylar is your Oncology Nurse Navigator and is available to assist you while you're receiving your medical care at [facility].

## 2017-09-08 ENCOUNTER — Telehealth: Payer: Self-pay

## 2017-09-08 NOTE — Telephone Encounter (Signed)
Per 4/23 no los 

## 2017-09-09 ENCOUNTER — Telehealth: Payer: Self-pay | Admitting: Medical Oncology

## 2017-09-09 NOTE — Telephone Encounter (Signed)
Manuel Landry called stating that he has reached his treatment decision for his prostate cancer. He would like to proceed with ADT, TURP and radiation. I explained that Dr. Alan Ripper office will schedule the ADT and TURP. Once he has healed from TURP, Dr. Tammi Klippel will schedule his radiation. He voiced understanding. I forwarded this information to Dr. [name], Dr. Diona Fanti and Dr. Tammi Klippel.

## 2017-09-09 NOTE — Progress Notes (Signed)
[facility] Psychosocial Distress Screening Spiritual Care  Met with Wendall and his wife in [facility] to introduce [facility] team/resources, reviewing distress screen per protocol.  The patient scored a 10 on the Psychosocial Distress Thermometer which indicates severe distress. Also assessed for distress and other psychosocial needs.   ONCBCN DISTRESS SCREENING 09/09/2017  Distress experienced in past week (1-10) 10  Emotional problem type Nervousness/Anxiety;Adjusting to illness  Information Concerns Type Lack of info about treatment  Referral to support programs Yes   Per couple, faith and communication are two key coping and meaning-making tools. They are grieving the loss of a beloved dog, while also appreciating living in a retirement community (less maintenance/more pleasant yard work) and the flexibility to take small trips. They also look forward to getting a schnoodle puppy this spring/summer.  Per couple, PMDC information and team helped decrease distress to a 6. Provided emotional support, normalized feelings, and encouraged utilizing Support Center team/resources.   Follow up needed: No. Per couple, no other needs at this time, but please page if immediate needs arise or circumstances change. Thank you.   [place], [place], [facility] Pager [phone] Voicemail [phone]

## 2017-09-10 NOTE — Addendum Note (Signed)
Encounter addended by: Tyler Pita, MD on: 09/10/2017 5:55 PM  Actions taken: LOS modified

## 2017-09-10 NOTE — Addendum Note (Signed)
Encounter addended by: Tyler Pita, MD on: 09/10/2017 5:51 PM  Actions taken: Visit Navigator Flowsheet section accepted

## 2017-09-15 ENCOUNTER — Encounter: Payer: Self-pay | Admitting: Medical Oncology

## 2017-09-15 DIAGNOSIS — C61 Malignant neoplasm of prostate: Secondary | ICD-10-CM | POA: Diagnosis not present

## 2017-09-15 DIAGNOSIS — Z5111 Encounter for antineoplastic chemotherapy: Secondary | ICD-10-CM | POA: Diagnosis not present

## 2017-09-23 DIAGNOSIS — L820 Inflamed seborrheic keratosis: Secondary | ICD-10-CM | POA: Diagnosis not present

## 2017-09-23 DIAGNOSIS — L570 Actinic keratosis: Secondary | ICD-10-CM | POA: Diagnosis not present

## 2017-09-23 DIAGNOSIS — D235 Other benign neoplasm of skin of trunk: Secondary | ICD-10-CM | POA: Diagnosis not present

## 2017-09-23 DIAGNOSIS — D485 Neoplasm of uncertain behavior of skin: Secondary | ICD-10-CM | POA: Diagnosis not present

## 2017-09-23 DIAGNOSIS — D1801 Hemangioma of skin and subcutaneous tissue: Secondary | ICD-10-CM | POA: Diagnosis not present

## 2017-09-23 DIAGNOSIS — L821 Other seborrheic keratosis: Secondary | ICD-10-CM | POA: Diagnosis not present

## 2017-10-04 ENCOUNTER — Telehealth: Payer: Self-pay | Admitting: Medical Oncology

## 2017-10-04 NOTE — Telephone Encounter (Signed)
Mr. Hassell called stating he received Firmagon 09/15/17 and returns for Lupron 10/19/17. He asked if he will still need to get the TURP. We discussed how the ADT may shrink the prostate and hopefully he will see improvements in his urinary status. If he does not see improvement then he can proceed with TURP. He just does not want another surgical procedure unless it is the last resort. He has not been scheduled for gold markers. I will notify Dr. Tammi Klippel and Enid Derry so this can be followed.

## 2017-10-07 DIAGNOSIS — N401 Enlarged prostate with lower urinary tract symptoms: Secondary | ICD-10-CM | POA: Diagnosis not present

## 2017-10-07 DIAGNOSIS — R350 Frequency of micturition: Secondary | ICD-10-CM | POA: Diagnosis not present

## 2017-10-07 DIAGNOSIS — C61 Malignant neoplasm of prostate: Secondary | ICD-10-CM | POA: Diagnosis not present

## 2017-10-19 DIAGNOSIS — Z5111 Encounter for antineoplastic chemotherapy: Secondary | ICD-10-CM | POA: Diagnosis not present

## 2017-10-19 DIAGNOSIS — C61 Malignant neoplasm of prostate: Secondary | ICD-10-CM | POA: Diagnosis not present

## 2017-10-29 ENCOUNTER — Other Ambulatory Visit: Payer: Self-pay | Admitting: Urology

## 2017-11-04 ENCOUNTER — Encounter: Payer: Self-pay | Admitting: Urology

## 2017-11-04 ENCOUNTER — Telehealth: Payer: Self-pay | Admitting: Medical Oncology

## 2017-11-04 DIAGNOSIS — C61 Malignant neoplasm of prostate: Secondary | ICD-10-CM

## 2017-11-04 NOTE — Progress Notes (Signed)
Cira Rue, RN was able to confirm that patient is scheduled to have fiducials, Urolift, bladder Bx and SpaceOAR on 12/15/17.  Will proceed with scheduling CT SIM approximately 2 weeks after procedure. Ailene Ards

## 2017-11-04 NOTE — Telephone Encounter (Signed)
Spoke with patinet to confirm Urolift, fiducial markers and SpaceOAR 12/15/17 at [facility].

## 2017-11-30 ENCOUNTER — Encounter: Payer: Self-pay | Admitting: Family Medicine

## 2017-11-30 DIAGNOSIS — N401 Enlarged prostate with lower urinary tract symptoms: Secondary | ICD-10-CM | POA: Diagnosis not present

## 2017-11-30 DIAGNOSIS — C61 Malignant neoplasm of prostate: Secondary | ICD-10-CM | POA: Diagnosis not present

## 2017-11-30 DIAGNOSIS — N3941 Urge incontinence: Secondary | ICD-10-CM | POA: Diagnosis not present

## 2017-11-30 DIAGNOSIS — R350 Frequency of micturition: Secondary | ICD-10-CM | POA: Diagnosis not present

## 2017-12-01 ENCOUNTER — Other Ambulatory Visit: Payer: Self-pay | Admitting: Family Medicine

## 2017-12-01 DIAGNOSIS — M549 Dorsalgia, unspecified: Secondary | ICD-10-CM

## 2017-12-02 ENCOUNTER — Ambulatory Visit: Payer: Medicare Other | Attending: Family Medicine | Admitting: Physical Therapy

## 2017-12-02 ENCOUNTER — Encounter: Payer: Self-pay | Admitting: Physical Therapy

## 2017-12-02 ENCOUNTER — Other Ambulatory Visit: Payer: Self-pay

## 2017-12-02 DIAGNOSIS — M62830 Muscle spasm of back: Secondary | ICD-10-CM | POA: Insufficient documentation

## 2017-12-02 DIAGNOSIS — M545 Low back pain: Secondary | ICD-10-CM | POA: Diagnosis not present

## 2017-12-02 DIAGNOSIS — G8929 Other chronic pain: Secondary | ICD-10-CM | POA: Insufficient documentation

## 2017-12-02 DIAGNOSIS — R29898 Other symptoms and signs involving the musculoskeletal system: Secondary | ICD-10-CM | POA: Diagnosis not present

## 2017-12-02 NOTE — Patient Instructions (Signed)

## 2017-12-02 NOTE — Therapy (Signed)
[facility] [address]  [place], [place], [address] Phone: [phone]   Fax:  [phone]  Physical Therapy Evaluation  Patient Details  Name: Manuel Landry MRN: [id number] Date of Birth: [birth year] Referring Provider: Riki Sheer, DO   Encounter Date: 12/02/2017  PT End of Session - 12/02/17 1458    Visit Number  1    Number of Visits  12    Date for PT Re-Evaluation  01/14/18    Authorization Type  Medicare & [employer]BS [employer]    Authorization Time Period  100 days    PT Start Time  1449    PT Stop Time  1531    PT Time Calculation (min)  42 min    Activity Tolerance  Patient tolerated treatment well    Behavior During Therapy  [facility] for tasks assessed/performed       Past Medical History:  Diagnosis Date  . Adenomatous colon polyp   . Allergy   . Anxiety   . Arthritis   . Backache, unspecified   . Barrett's esophagus    NOT A PROBLEM AT THE CURRENT TIME  . Blind loop syndrome   . BPH (benign prostatic hypertrophy)    DR. DAHLSTEDT  . Bulging discs    LOWER BACK  . Cataract   . Diverticulosis   . Family history of malignant neoplasm of gastrointestinal tract   . Glaucoma   . Hyperlipidemia   . IBS (irritable bowel syndrome)   . Malignant neoplasm of prostate ([place]) 09/06/2017  . Stenosis of rectum and anus   . Unspecified gastritis and gastroduodenitis without mention of hemorrhage   . Vertigo     Past Surgical History:  Procedure Laterality Date  . CHOLECYSTECTOMY    . COLONOSCOPY    . INGUINAL HERNIA REPAIR     x 2  . MANDIBLE SURGERY    . right foot surgery    . ROTATOR CUFF REPAIR Right   . SHOULDER OPEN ROTATOR CUFF REPAIR  03/17/2012   Procedure: ROTATOR CUFF REPAIR SHOULDER OPEN;  Surgeon: Johnn Hai, MD;  Location: [facility];  Service: Orthopedics;  Laterality: Left;  LEFT MINI OPEN ROTATOR CUFF REPAIR   . TONSILLECTOMY    . VASECTOMY      There were no vitals filed  for this visit.   Subjective Assessment - 12/02/17 1501    Subjective  20 min to 30 min back and shoulder stretches every morning. States he began noticing issues with the back on Friday 11/26/2017. States he was on hands and knees and using a hammer when he began to notice some issues with his back. Did his stretches the next morning and thought he would be able to deal with the pain but noticed a significant increase in pain on Sunday. Had no pain yesterday, but is experiencing pain today.     Limitations  House hold activities;Lifting    Currently in Pain?  Yes    Pain Score  5     Pain Location  Back    Pain Orientation  Right;Left    Pain Descriptors / Indicators  Constant;Cramping    Pain Type  Chronic pain    Pain Onset  In the past 7 days    Pain Frequency  Constant    Aggravating Factors   Worse in the morning with bending over.     Pain Relieving Factors  Heat/Ice, Ibuprofen (800 mg)  Effect of Pain on Daily Activities  Difficulty with putting socks on, getting in an out of the car.          [facility] PT Assessment - 12/02/17 1840      Assessment   Medical Diagnosis  Back Pain    Referring Provider  Riki Sheer, DO    Onset Date/Surgical Date  11/26/17    Next MD Visit  PRN    Prior Therapy  Yes      Balance Screen   Has the patient fallen in the past 6 months  No    Has the patient had a decrease in activity level because of a fear of falling?   No    Is the patient reluctant to leave their home because of a fear of falling?   No      Home Environment   Living Environment  Private residence    Living Arrangements  Spouse/significant other    Type of [place] Access  Level entry      Prior Function   Level of Independence  Independent    Vocation  Retired    Chartered certified accountant, yard work      Posture/Postural Control   Posture/Postural Control  Postural limitations    Postural Limitations   Decreased lumbar lordosis;Posterior pelvic tilt                Objective measurements completed on examination: See above findings.      [facility] Adult PT Treatment/Exercise - 12/02/17 1419      Manual Therapy   Manual Therapy  Soft tissue mobilization;Myofascial release    Manual therapy comments  prone    Soft tissue mobilization  STM/DTM to B lumbar paraspinals     Myofascial Release  manual TPR L lumbar paraspinals       Trigger Point Dry Needling - 12/02/17 1449    Consent Given?  Yes    Education Handout Provided  Yes    Muscles Treated Upper Body  Longissimus on L + B multifidi    Longissimus Response  Twitch response elicited;Palpable increased muscle length           PT Education - 12/02/17 1928    Education Details  Pt educated on findings of evaluation, plan of care, and trigger point dry needling    Person(s) Educated  Patient    Methods  Explanation;Handout    Comprehension  Verbalized understanding          PT Long Term Goals - 12/02/17 1924      PT LONG TERM GOAL #1   Title  Pt to report ability put on socks independently without increased pain    Status  New    [place] Date  01/13/18      PT LONG TERM GOAL #2   Title  Pt to report ability to get into and out of car without increase in back pain    Status  New    Target Date  01/13/18      PT LONG TERM GOAL #3   Title  Pt to report a pain score at worst in the low back of no greater than 2/10    Status  New    Target Date  01/13/18      PT LONG TERM GOAL #4   Title  Pt to demonstrate improved tissue quality with reduced TTP and symmetrical  presentation     Status  New    Target Date  01/13/18             Plan - 12/02/17 1531    Clinical Impression Statement  Manuel Landry is a 72 y/o male who present to OP PT with acute exacerbation of chronic LBP after working on a project in his yard the end of last week. He reports fluctuating LBP in lower lumbar spine and over B SIJ, L > R, for  which he has been unable to significantly resolve with performance of ongoing low back HEP of lumbopelvic stretches and exercises or use of TENS and thermal modalities. Pt requesting brief PT episode with emphasis on manual soft tissue mobilization and modalities as he is scheduled for upcoming procedure for gold seed implantation for acute prostate cancer on 12/15/17 and states he already feels confident with HEP. Given acute active cancer in local proximity to low back, will defer [place] and estim modalities and focus primarily on manual therapy including DN to address increased lumbar paraspinal muscle tension/spams.    History and Personal Factors relevant to plan of care:  chronic LBP x 20 yrs; acute prostate cancer; B RTC    Clinical Presentation  Evolving    Clinical Decision Making  Low    Rehab Potential  Good    Clinical Impairments Affecting Rehab Potential  acute prostate cancer - gold seed implant pending for 12/15/17    PT Frequency  2x / week    PT Duration  6 weeks x1 wk until cancer treatment procedure, then PRN for remaining POC    PT Treatment/Interventions  Patient/family education;Manual techniques;Dry needling;Taping;Moist Heat;Cryotherapy;Therapeutic exercise;Therapeutic activities;Functional mobility training;ADLs/Self Care Home Management    Consulted and Agree with Plan of Care  Patient       Patient will benefit from skilled therapeutic intervention in order to improve the following deficits and impairments:  Pain, Increased muscle spasms, Impaired flexibility, Increased fascial restricitons, Decreased range of motion, Decreased activity tolerance  Visit Diagnosis: Chronic left-sided low back pain, with sciatica presence unspecified  Muscle spasm of back  Other symptoms and signs involving the musculoskeletal system     Problem List Patient Active Problem List   Diagnosis Date Noted  . Malignant neoplasm of prostate ([place]) 09/06/2017  . Hyperlipidemia 01/27/2017  .  Cardiovascular risk factor 08/12/2011  . S/P cholecystectomy 06/22/2011  . Personal history of colonic polyps 06/22/2011  . CHOLELITHIASIS 02/13/2010  . FLATULENCE-GAS-BLOATING 02/13/2010  . CHOLELITHIASIS 02/11/2010  . GERD 02/10/2010  . DIVERTICULOSIS-COLON 02/10/2010  . NAUSEA ALONE 02/10/2010  . ABDOMINAL PAIN RIGHT UPPER QUADRANT 02/10/2010  . ABDOMINAL PAIN, EPIGASTRIC 02/10/2010  . ANEMIA-IRON DEFICIENCY 07/17/2008  . INGUINAL HERNIA 07/17/2008  . IRRITABLE BOWEL SYNDROME 07/17/2008  . GASTRITIS 07/11/2008  . DUODENITIS 07/11/2008  . RENAL CYST, RIGHT 07/11/2008  . DYSPEPSIA 06/12/2008  . CONSTIPATION 05/31/2008  . BACK PAIN, CHRONIC 05/31/2008  . BENIGN PROSTATIC HYPERTROPHY, MILD, HX OF 05/31/2008    Percival Spanish, PT, MPT 12/02/2017, 7:37 PM  [facility] [address]  [address] [place], [place], [address] Phone: [phone]   Fax:  [phone]  Name: Manuel Landry MRN: [id number] Date of Birth: 1945-12-19

## 2017-12-03 ENCOUNTER — Encounter: Payer: Self-pay | Admitting: Physical Therapy

## 2017-12-07 ENCOUNTER — Encounter: Payer: Self-pay | Admitting: Physical Therapy

## 2017-12-07 ENCOUNTER — Ambulatory Visit: Payer: Medicare Other | Admitting: Physical Therapy

## 2017-12-07 DIAGNOSIS — R29898 Other symptoms and signs involving the musculoskeletal system: Secondary | ICD-10-CM

## 2017-12-07 DIAGNOSIS — M545 Low back pain: Secondary | ICD-10-CM | POA: Diagnosis not present

## 2017-12-07 DIAGNOSIS — M62830 Muscle spasm of back: Secondary | ICD-10-CM

## 2017-12-07 DIAGNOSIS — G8929 Other chronic pain: Secondary | ICD-10-CM | POA: Diagnosis not present

## 2017-12-07 NOTE — Therapy (Signed)
[facility] [address]  [place], [place], [address] Phone: [phone]   Fax:  [phone]  Physical Therapy Treatment  Patient Details  Name: Manuel Landry MRN: [id number] Date of Birth: 03/28/1946 Referring Provider: Riki Sheer, DO   Encounter Date: 12/07/2017  PT End of Session - 12/07/17 1401    Visit Number  2    Number of Visits  12    Date for PT Re-Evaluation  01/14/18    Authorization Type  Medicare & BCBS [employer]    Authorization Time Period  100 days    PT Start Time  1401    PT Stop Time  1429    PT Time Calculation (min)  28 min    Activity Tolerance  Patient tolerated treatment well    Behavior During Therapy  [facility] for tasks assessed/performed       Past Medical History:  Diagnosis Date  . Adenomatous colon polyp   . Allergy   . Anxiety   . Arthritis   . Backache, unspecified   . Barrett's esophagus    NOT A PROBLEM AT THE CURRENT TIME  . Blind loop syndrome   . BPH (benign prostatic hypertrophy)    DR. DAHLSTEDT  . Bulging discs    LOWER BACK  . Cataract   . Diverticulosis   . Family history of malignant neoplasm of gastrointestinal tract   . Glaucoma   . Hyperlipidemia   . IBS (irritable bowel syndrome)   . Malignant neoplasm of prostate ([place]) 09/06/2017  . Stenosis of rectum and anus   . Unspecified gastritis and gastroduodenitis without mention of hemorrhage   . Vertigo     Past Surgical History:  Procedure Laterality Date  . CHOLECYSTECTOMY    . COLONOSCOPY    . INGUINAL HERNIA REPAIR     x 2  . MANDIBLE SURGERY    . right foot surgery    . ROTATOR CUFF REPAIR Right   . SHOULDER OPEN ROTATOR CUFF REPAIR  03/17/2012   Procedure: ROTATOR CUFF REPAIR SHOULDER OPEN;  Surgeon: Johnn Hai, MD;  Location: [facility];  Service: Orthopedics;  Laterality: Left;  LEFT MINI OPEN ROTATOR CUFF REPAIR   . TONSILLECTOMY    . VASECTOMY      There were no vitals filed  for this visit.  Subjective Assessment - 12/07/17 1403    Subjective  Pt noting good relief from DN last session. has not needed to use heat or topical analgesics as much.    Limitations  House hold activities;Lifting    Currently in Pain?  Yes    Pain Score  5  up to 8/10 at worst (usually worst upon rising in the morning)    Pain Location  Back    Pain Orientation  Left;Right    Pain Descriptors / Indicators  Aching    Pain Type  Chronic pain                       [facility] Adult PT Treatment/Exercise - 12/07/17 1401      Manual Therapy   Manual Therapy  Soft tissue mobilization;Myofascial release;Taping    Manual therapy comments  prone    Soft tissue mobilization  STM/DTM to B lumbar paraspinals     Myofascial Release  manual TPR/MFR L lumbar paraspinals    Kinesiotex  Inhibit Muscle      Kinesiotix   Inhibit Muscle   B  lumbar paraspinals - 30%       Trigger Point Dry Needling - 12/07/17 1401    Consent Given?  Yes    Muscles Treated Upper Body  Longissimus on L + B multifidi    Longissimus Response  Twitch response elicited;Palpable increased muscle length           PT Education - 12/07/17 1429    Education Details  Wearing instruction for kinesiotape    Person(s) Educated  Patient    Methods  Explanation    Comprehension  Verbalized understanding          PT Long Term Goals - 12/07/17 1433      PT LONG TERM GOAL #1   Title  Pt to report ability put on socks independently without increased pain    Status  On-going      PT LONG TERM GOAL #2   Title  Pt to report ability to get into and out of car without increase in back pain    Status  On-going      PT LONG TERM GOAL #3   Title  Pt to report a pain score at worst in the low back of no greater than 2/10    Status  On-going      PT LONG TERM GOAL #4   Title  Pt to demonstrate improved tissue quality with reduced TTP and symmetrical presentation     Status  On-going             Plan - 12/07/17 1438    Clinical Impression Statement  [name] deferring warm-up today stating he has already been active/exercising before he came to PT today. He notes benefit from DN on eval with reduction in need for use of thermal patches or topical analgesics since last visit, although pain rating unchanged. Continued increased muscle tension and TTP noted in lumbar paraspinals, L>R, which again responded positively to manual therapy and DN. Pt declining need for review of appropriate stretches and exercises to address ongoing low back issues, stating he has a routine of 15 exercises he does daily at home. Treatment concluded with application of kinesiotape to lumbar paraspinals to promote further muscle relaxation.    Rehab Potential  Good    Clinical Impairments Affecting Rehab Potential  acute prostate cancer - gold seed implant pending for 12/15/17    PT Frequency  2x / week    PT Duration  6 weeks x1 wk until cancer treatment procedure, then PRN for remaining POC    PT Treatment/Interventions  Patient/family education;Manual techniques;Dry needling;Taping;Moist Heat;Cryotherapy;Therapeutic exercise;Therapeutic activities;Functional mobility training;ADLs/Self Care Home Management    Consulted and Agree with Plan of Care  Patient       Patient will benefit from skilled therapeutic intervention in order to improve the following deficits and impairments:  Pain, Increased muscle spasms, Impaired flexibility, Increased fascial restricitons, Decreased range of motion, Decreased activity tolerance  Visit Diagnosis: Chronic left-sided low back pain, with sciatica presence unspecified  Muscle spasm of back  Other symptoms and signs involving the musculoskeletal system     Problem List Patient Active Problem List   Diagnosis Date Noted  . Malignant neoplasm of prostate ([place]) 09/06/2017  . Hyperlipidemia 01/27/2017  . Cardiovascular risk factor 08/12/2011  . S/P  cholecystectomy 06/22/2011  . Personal history of colonic polyps 06/22/2011  . CHOLELITHIASIS 02/13/2010  . FLATULENCE-GAS-BLOATING 02/13/2010  . CHOLELITHIASIS 02/11/2010  . GERD 02/10/2010  . DIVERTICULOSIS-COLON 02/10/2010  . NAUSEA ALONE 02/10/2010  . ABDOMINAL  PAIN RIGHT UPPER QUADRANT 02/10/2010  . ABDOMINAL PAIN, EPIGASTRIC 02/10/2010  . ANEMIA-IRON DEFICIENCY 07/17/2008  . INGUINAL HERNIA 07/17/2008  . IRRITABLE BOWEL SYNDROME 07/17/2008  . GASTRITIS 07/11/2008  . DUODENITIS 07/11/2008  . RENAL CYST, RIGHT 07/11/2008  . DYSPEPSIA 06/12/2008  . CONSTIPATION 05/31/2008  . BACK PAIN, CHRONIC 05/31/2008  . BENIGN PROSTATIC HYPERTROPHY, MILD, HX OF 05/31/2008    Percival Spanish, PT, MPT 12/07/2017, 4:55 PM  [facility] [place], [place], [address] Phone: [phone]   Fax:  [phone]  Name: WEBER MONNIER MRN: [id number] Date of Birth: 10-24-1945

## 2017-12-10 ENCOUNTER — Ambulatory Visit: Payer: Medicare Other | Admitting: Physical Therapy

## 2017-12-10 ENCOUNTER — Encounter: Payer: Self-pay | Admitting: Physical Therapy

## 2017-12-10 ENCOUNTER — Encounter (HOSPITAL_BASED_OUTPATIENT_CLINIC_OR_DEPARTMENT_OTHER): Payer: Self-pay | Admitting: *Deleted

## 2017-12-10 ENCOUNTER — Encounter: Payer: Self-pay | Admitting: Radiation Oncology

## 2017-12-10 ENCOUNTER — Other Ambulatory Visit: Payer: Self-pay

## 2017-12-10 DIAGNOSIS — G8929 Other chronic pain: Secondary | ICD-10-CM | POA: Diagnosis not present

## 2017-12-10 DIAGNOSIS — R29898 Other symptoms and signs involving the musculoskeletal system: Secondary | ICD-10-CM | POA: Diagnosis not present

## 2017-12-10 DIAGNOSIS — M62830 Muscle spasm of back: Secondary | ICD-10-CM | POA: Diagnosis not present

## 2017-12-10 DIAGNOSIS — M545 Low back pain: Principal | ICD-10-CM

## 2017-12-10 NOTE — Therapy (Signed)
[facility] [address]  [place], [place], [address] Phone: [phone]   Fax:  [phone]  Physical Therapy Treatment  Patient Details  Name: Manuel Landry MRN: [id number] Date of Birth: [birth year] Referring Provider: Riki Sheer, DO   Encounter Date: 12/10/2017  PT End of Session - 12/10/17 0759    Visit Number  3    Number of Visits  12    Date for PT Re-Evaluation  01/14/18    Authorization Type  Medicare & [employer]    Authorization Time Period  100 days    PT Start Time  0759    PT Stop Time  0823    PT Time Calculation (min)  24 min    Activity Tolerance  Patient tolerated treatment well    Behavior During Therapy  [facility] for tasks assessed/performed       Past Medical History:  Diagnosis Date  . Adenomatous colon polyp   . Allergy   . Anxiety   . Arthritis   . Backache, unspecified   . Barrett's esophagus    NOT A PROBLEM AT THE CURRENT TIME  . Blind loop syndrome   . BPH (benign prostatic hypertrophy)    DR. DAHLSTEDT  . Bulging discs    LOWER BACK  . Cataract   . Diverticulosis   . Family history of malignant neoplasm of gastrointestinal tract   . Glaucoma   . Hyperlipidemia   . IBS (irritable bowel syndrome)   . Malignant neoplasm of prostate ([facility]) 09/06/2017  . Stenosis of rectum and anus   . Unspecified gastritis and gastroduodenitis without mention of hemorrhage   . Vertigo     Past Surgical History:  Procedure Laterality Date  . CHOLECYSTECTOMY    . COLONOSCOPY    . INGUINAL HERNIA REPAIR     x 2  . MANDIBLE SURGERY    . right foot surgery    . ROTATOR CUFF REPAIR Right   . SHOULDER OPEN ROTATOR CUFF REPAIR  03/17/2012   Procedure: ROTATOR CUFF REPAIR SHOULDER OPEN;  Surgeon: Johnn Hai, MD;  Location: [facility];  Service: Orthopedics;  Laterality: Left;  LEFT MINI OPEN ROTATOR CUFF REPAIR   . TONSILLECTOMY    . VASECTOMY      There were no vitals filed  for this visit.  Subjective Assessment - 12/10/17 0800    Subjective  Pt reporting the DN is better than the TENS/estim. Feels back is back to "normal" pain levels which he feels confident managing with his current HEP program.    Currently in Pain?  Yes    Pain Score  -- 3-5/10    Pain Location  Back    Pain Orientation  Lower;Left;Right    Pain Descriptors / Indicators  Aching    Pain Type  Chronic pain                       [facility] Adult PT Treatment/Exercise - 12/10/17 0759      Manual Therapy   Manual Therapy  Soft tissue mobilization;Myofascial release;Taping    Manual therapy comments  prone    Soft tissue mobilization  STM/DTM to B lumbar paraspinals     Myofascial Release  manual TPR/MFR L lumbar paraspinals    Kinesiotex  Inhibit Muscle      Kinesiotix   Inhibit Muscle   B lumbar paraspinals - 30%       Trigger Point  Dry Needling - 12/10/17 0759    Consent Given?  Yes    Muscles Treated Upper Body  Longissimus & lumbar multifidi on L    Longissimus Response  Twitch response elicited;Palpable increased muscle length                PT Long Term Goals - 12/10/17 0802      PT LONG TERM GOAL #1   Title  Pt to report ability put on socks independently without increased pain    Status  Achieved      PT LONG TERM GOAL #2   Title  Pt to report ability to get into and out of car without increase in back pain    Status  Achieved      PT LONG TERM GOAL #3   Title  Pt to report a pain score at worst in the low back of no greater than 2/10    Status  Not Met Pt reports he is back to his baseline level of LBP ~3-5/10      PT LONG TERM GOAL #4   Title  Pt to demonstrate improved tissue quality with reduced TTP and symmetrical presentation     Status  Achieved            Plan - 12/10/17 0826    Clinical Impression Statement  Manuel Landry very pleased with his progress, reporting back pain has returned to his "normal" levels that he has been dealing  with for the past 20 years and he is now able to perform normal daily activities and household projects w/o pain interference. No abnormal lumbar paraspinal muscle tension on R and only very mild increased tension and ttp persisting on L which completely resolved after today's manual therapy and DN. Pt continues to deny need for any review or update of existing HEP. All goals met except pain level at worst stil 3-5/10, but pt reports that this has been his baseline for the last 20 years. Pt scheduled to undergo gold seed implantation for prostate cancer next week, therefore pt wishing to proceed with discharge from PT for this epsiode.    Rehab Potential  Good    Clinical Impairments Affecting Rehab Potential  acute prostate cancer - gold seed implant pending for 12/15/17    PT Frequency  --    PT Duration  -- x1 wk until cancer treatment procedure, then PRN for remaining POC    PT Treatment/Interventions  Patient/family education;Manual techniques;Dry needling;Taping;Moist Heat;Cryotherapy;Therapeutic exercise;Therapeutic activities;Functional mobility training;ADLs/Self Care Home Management    PT Next Visit Plan  Discharge    Consulted and Agree with Plan of Care  Patient       Patient will benefit from skilled therapeutic intervention in order to improve the following deficits and impairments:  Pain, Increased muscle spasms, Impaired flexibility, Increased fascial restricitons, Decreased range of motion, Decreased activity tolerance  Visit Diagnosis: Chronic left-sided low back pain, with sciatica presence unspecified  Muscle spasm of back  Other symptoms and signs involving the musculoskeletal system     Problem List Patient Active Problem List   Diagnosis Date Noted  . Malignant neoplasm of prostate ([place]) 09/06/2017  . Hyperlipidemia 01/27/2017  . Cardiovascular risk factor 08/12/2011  . S/P cholecystectomy 06/22/2011  . Personal history of colonic polyps 06/22/2011  .  CHOLELITHIASIS 02/13/2010  . FLATULENCE-GAS-BLOATING 02/13/2010  . CHOLELITHIASIS 02/11/2010  . GERD 02/10/2010  . DIVERTICULOSIS-COLON 02/10/2010  . NAUSEA ALONE 02/10/2010  . ABDOMINAL PAIN RIGHT UPPER QUADRANT 02/10/2010  .  ABDOMINAL PAIN, EPIGASTRIC 02/10/2010  . ANEMIA-IRON DEFICIENCY 07/17/2008  . INGUINAL HERNIA 07/17/2008  . IRRITABLE BOWEL SYNDROME 07/17/2008  . GASTRITIS 07/11/2008  . DUODENITIS 07/11/2008  . RENAL CYST, RIGHT 07/11/2008  . DYSPEPSIA 06/12/2008  . CONSTIPATION 05/31/2008  . BACK PAIN, CHRONIC 05/31/2008  . BENIGN PROSTATIC HYPERTROPHY, MILD, HX OF 05/31/2008   PHYSICAL THERAPY DISCHARGE SUMMARY  Visits from Start of Care: 3  Current functional level related to goals / functional outcomes:   Refer to above clinical impression.   Remaining deficits:   As above.   Education / Equipment:   Role of DN & kinesiotaping; Pt declined HEP instruction, review or update.  Plan: Patient agrees to discharge.  Patient goals were mostly met. Patient is being discharged due to being pleased with the current functional level.  ?????     Percival Spanish, PT, MPT 12/10/2017, 8:37 AM  [facility] [address]  [place], [place], [address] Phone: [phone]   Fax:  [phone]  Name: Manuel Landry MRN: [id number] Date of Birth: [birth year]

## 2017-12-10 NOTE — Progress Notes (Signed)
Spoke w/ pt via phone for pre-op interview.  Npo after mn.  Arrive at 0900.  Will do fleet enema am dos.

## 2017-12-13 ENCOUNTER — Telehealth: Payer: Self-pay | Admitting: *Deleted

## 2017-12-13 NOTE — Telephone Encounter (Signed)
CALLED PATIENT TO INFORM OF SIM BEING MOVED TO 12-31-17 @ 9 AM , DUE TO UROLIFT PROCEDURE ALONG WITH FID. MARKERS AND SPACE OAR, SPOKE WITH PATIENT'S WIFE- CYNTHIA AND SHE IS AWARE OF THIS APPT. AND SHE ALSO IS AWARE OF WHY THIS PROCEDURE HAS BEEN MOVED

## 2017-12-15 ENCOUNTER — Encounter (HOSPITAL_BASED_OUTPATIENT_CLINIC_OR_DEPARTMENT_OTHER): Payer: Self-pay | Admitting: *Deleted

## 2017-12-15 ENCOUNTER — Ambulatory Visit (HOSPITAL_BASED_OUTPATIENT_CLINIC_OR_DEPARTMENT_OTHER): Payer: Medicare Other | Admitting: Anesthesiology

## 2017-12-15 ENCOUNTER — Encounter (HOSPITAL_BASED_OUTPATIENT_CLINIC_OR_DEPARTMENT_OTHER)
Admission: RE | Disposition: A | Discharge status: Home / Self Care | Payer: Self-pay | Source: Ambulatory Visit | Attending: Urology

## 2017-12-15 ENCOUNTER — Ambulatory Visit (HOSPITAL_BASED_OUTPATIENT_CLINIC_OR_DEPARTMENT_OTHER)
Admission: RE | Admit: 2017-12-15 | Discharge: 2017-12-15 | Disposition: A | Discharge status: Home / Self Care | Payer: Medicare Other | Source: Ambulatory Visit | Attending: Urology | Admitting: Urology

## 2017-12-15 DIAGNOSIS — Z79899 Other long term (current) drug therapy: Secondary | ICD-10-CM | POA: Insufficient documentation

## 2017-12-15 DIAGNOSIS — N401 Enlarged prostate with lower urinary tract symptoms: Secondary | ICD-10-CM | POA: Diagnosis not present

## 2017-12-15 DIAGNOSIS — N138 Other obstructive and reflux uropathy: Secondary | ICD-10-CM | POA: Diagnosis not present

## 2017-12-15 DIAGNOSIS — D0919 Carcinoma in situ of other urinary organs: Secondary | ICD-10-CM | POA: Diagnosis not present

## 2017-12-15 DIAGNOSIS — C680 Malignant neoplasm of urethra: Secondary | ICD-10-CM | POA: Diagnosis not present

## 2017-12-15 DIAGNOSIS — Z8546 Personal history of malignant neoplasm of prostate: Secondary | ICD-10-CM

## 2017-12-15 DIAGNOSIS — K219 Gastro-esophageal reflux disease without esophagitis: Secondary | ICD-10-CM | POA: Diagnosis not present

## 2017-12-15 DIAGNOSIS — C61 Malignant neoplasm of prostate: Secondary | ICD-10-CM | POA: Diagnosis not present

## 2017-12-15 HISTORY — DX: Personal history of other diseases of the digestive system: Z87.19

## 2017-12-15 HISTORY — DX: Nausea with vomiting, unspecified: R11.2

## 2017-12-15 HISTORY — DX: Nausea with vomiting, unspecified: Z98.890

## 2017-12-15 HISTORY — PX: CYSTOSCOPY WITH INSERTION OF UROLIFT: SHX6678

## 2017-12-15 HISTORY — DX: Personal history of adenomatous and serrated colon polyps: Z86.0101

## 2017-12-15 HISTORY — DX: Personal history of colonic polyps: Z86.010

## 2017-12-15 HISTORY — DX: Other seasonal allergic rhinitis: J30.2

## 2017-12-15 HISTORY — DX: Presence of spectacles and contact lenses: Z97.3

## 2017-12-15 HISTORY — PX: SPACE OAR INSTILLATION: SHX6769

## 2017-12-15 SURGERY — INJECTION, HYDROGEL SPACER
Anesthesia: General | Site: Rectum

## 2017-12-15 MED ORDER — PROPOFOL 10 MG/ML IV BOLUS
INTRAVENOUS | Status: AC
  Filled 2017-12-15: qty 40

## 2017-12-15 MED ORDER — KETOROLAC TROMETHAMINE 30 MG/ML IJ SOLN
INTRAMUSCULAR | Status: DC | PRN
  Administered 2017-12-15: 15 mg via INTRAVENOUS

## 2017-12-15 MED ORDER — PROPOFOL 10 MG/ML IV BOLUS
INTRAVENOUS | Status: DC | PRN
  Administered 2017-12-15: 150 mg via INTRAVENOUS
  Administered 2017-12-15: 50 mg via INTRAVENOUS

## 2017-12-15 MED ORDER — FENTANYL CITRATE (PF) 100 MCG/2ML IJ SOLN
25.0000 ug | INTRAMUSCULAR | Status: DC | PRN
  Filled 2017-12-15: qty 1

## 2017-12-15 MED ORDER — KETOROLAC TROMETHAMINE 30 MG/ML IJ SOLN
INTRAMUSCULAR | Status: AC
  Filled 2017-12-15: qty 1

## 2017-12-15 MED ORDER — CEPHALEXIN 500 MG PO CAPS: 500.0000 mg | ORAL_CAPSULE | Freq: Two times a day (BID) | ORAL | 0 refills | Status: DC

## 2017-12-15 MED ORDER — PHENYLEPHRINE 40 MCG/ML (10ML) SYRINGE FOR IV PUSH (FOR BLOOD PRESSURE SUPPORT)
PREFILLED_SYRINGE | INTRAVENOUS | Status: AC
  Filled 2017-12-15: qty 10

## 2017-12-15 MED ORDER — FENTANYL CITRATE (PF) 100 MCG/2ML IJ SOLN
INTRAMUSCULAR | Status: AC
  Filled 2017-12-15: qty 2

## 2017-12-15 MED ORDER — PROMETHAZINE HCL 25 MG/ML IJ SOLN
6.2500 mg | INTRAMUSCULAR | Status: DC | PRN
  Filled 2017-12-15: qty 1

## 2017-12-15 MED ORDER — PHENYLEPHRINE HCL 10 MG/ML IJ SOLN
INTRAMUSCULAR | Status: DC | PRN
  Administered 2017-12-15: 80 ug via INTRAVENOUS

## 2017-12-15 MED ORDER — OXYBUTYNIN CHLORIDE 5 MG PO TABS: 5.0000 mg | ORAL_TABLET | Freq: Three times a day (TID) | ORAL | 1 refills | Status: DC | PRN

## 2017-12-15 MED ORDER — SODIUM CHLORIDE 0.9 % IJ SOLN
INTRAMUSCULAR | Status: DC | PRN
  Administered 2017-12-15: 10 mL

## 2017-12-15 MED ORDER — ONDANSETRON HCL 4 MG/2ML IJ SOLN
INTRAMUSCULAR | Status: DC | PRN
  Administered 2017-12-15: 4 mg via INTRAVENOUS

## 2017-12-15 MED ORDER — FLEET ENEMA 7-19 GM/118ML RE ENEM
1.0000 | ENEMA | Freq: Once | RECTAL | Status: DC
  Filled 2017-12-15: qty 1

## 2017-12-15 MED ORDER — LIDOCAINE 2% (20 MG/ML) 5 ML SYRINGE
INTRAMUSCULAR | Status: DC | PRN
  Administered 2017-12-15: 60 mg via INTRAVENOUS

## 2017-12-15 MED ORDER — DEXAMETHASONE SODIUM PHOSPHATE 10 MG/ML IJ SOLN
INTRAMUSCULAR | Status: AC
  Filled 2017-12-15: qty 1

## 2017-12-15 MED ORDER — FENTANYL CITRATE (PF) 100 MCG/2ML IJ SOLN
INTRAMUSCULAR | Status: DC | PRN
  Administered 2017-12-15: 50 ug via INTRAVENOUS

## 2017-12-15 MED ORDER — ACETAMINOPHEN 10 MG/ML IV SOLN
1000.0000 mg | Freq: Once | INTRAVENOUS | Status: DC | PRN
  Filled 2017-12-15: qty 100

## 2017-12-15 MED ORDER — ONDANSETRON HCL 4 MG/2ML IJ SOLN
INTRAMUSCULAR | Status: AC
  Filled 2017-12-15: qty 2

## 2017-12-15 MED ORDER — CEFAZOLIN SODIUM-DEXTROSE 2-4 GM/100ML-% IV SOLN
2.0000 g | INTRAVENOUS | Status: AC
  Administered 2017-12-15: 2 g via INTRAVENOUS
  Filled 2017-12-15: qty 100

## 2017-12-15 MED ORDER — STERILE WATER FOR IRRIGATION IR SOLN
Status: DC | PRN
  Administered 2017-12-15: 3000 mL

## 2017-12-15 MED ORDER — LIDOCAINE 2% (20 MG/ML) 5 ML SYRINGE
INTRAMUSCULAR | Status: AC
  Filled 2017-12-15: qty 5

## 2017-12-15 MED ORDER — DEXAMETHASONE SODIUM PHOSPHATE 10 MG/ML IJ SOLN
INTRAMUSCULAR | Status: DC | PRN
  Administered 2017-12-15: 10 mg via INTRAVENOUS

## 2017-12-15 MED ORDER — LACTATED RINGERS IV SOLN
INTRAVENOUS | Status: DC
  Administered 2017-12-15: 10:00:00 via INTRAVENOUS
  Filled 2017-12-15: qty 1000

## 2017-12-15 MED ORDER — CEFAZOLIN SODIUM-DEXTROSE 2-4 GM/100ML-% IV SOLN
INTRAVENOUS | Status: AC
  Filled 2017-12-15: qty 100

## 2017-12-15 MED FILL — OXYBUTYNIN CHLORIDE 5 MG TA: 5 | 5 days supply | Qty: 15 | Fill #0

## 2017-12-15 MED FILL — CEPHALEXIN 500 MG CAPSULE: 500 | 3 days supply | Qty: 6 | Fill #0

## 2017-12-15 SURGICAL SUPPLY — 28 items
BAG DRAIN URO-CYSTO SKYTR STRL (DRAIN) ×5 IMPLANT
BAG URINE DRAINAGE (UROLOGICAL SUPPLIES) IMPLANT
BAG URINE LEG 19OZ MD ST LTX (BAG) ×10 IMPLANT
BAG URINE LEG 500ML (DRAIN) IMPLANT
CATH FOLEY 2WAY SLVR  5CC 18FR (CATHETERS) ×4
CATH FOLEY 2WAY SLVR 5CC 18FR (CATHETERS) ×6 IMPLANT
CLOTH BEACON ORANGE TIMEOUT ST (SAFETY) ×5 IMPLANT
DRSG TEGADERM 4X4.75 (GAUZE/BANDAGES/DRESSINGS) IMPLANT
DRSG TEGADERM 8X12 (GAUZE/BANDAGES/DRESSINGS) IMPLANT
GLOVE BIO SURGEON STRL SZ8 (GLOVE) ×5 IMPLANT
GLOVE BIOGEL PI IND STRL 7.0 (GLOVE) ×3 IMPLANT
GLOVE BIOGEL PI IND STRL 8.5 (GLOVE) ×3 IMPLANT
GLOVE BIOGEL PI INDICATOR 7.0 (GLOVE) ×2
GLOVE BIOGEL PI INDICATOR 8.5 (GLOVE) ×2
GLOVE ECLIPSE 8.0 STRL XLNG CF (GLOVE) IMPLANT
GLOVE INDICATOR 7.0 STRL GRN (GLOVE) ×5 IMPLANT
GLOVE INDICATOR 8.5 STRL (GLOVE) ×5 IMPLANT
GOWN STRL REUS W/TWL XL LVL3 (GOWN DISPOSABLE) ×15 IMPLANT
IMPL SPACEOAR SYSTEM 10ML (MISCELLANEOUS) ×3 IMPLANT
IMPLANT SPACEOAR SYSTEM 10ML (MISCELLANEOUS) ×5
KIT TURNOVER CYSTO (KITS) ×5 IMPLANT
MANIFOLD NEPTUNE II (INSTRUMENTS) ×5 IMPLANT
PACK CYSTO (CUSTOM PROCEDURE TRAY) ×5 IMPLANT
SYSTEM UROLIFT (Male Continence) ×30 IMPLANT
TUBE CONNECTING 12'X1/4 (SUCTIONS) ×1
TUBE CONNECTING 12X1/4 (SUCTIONS) ×4 IMPLANT
UNDERPAD 30X30 (UNDERPADS AND DIAPERS) ×10 IMPLANT
WATER STERILE IRR 3000ML UROMA (IV SOLUTION) ×10 IMPLANT

## 2017-12-15 NOTE — Op Note (Signed)
Preoperative diagnosis: 1.  High risk prostate cancer, with patient initiated on androgen deprivation therapy, and advance of external beam radiotherapy 2.  BPH with obstructive symptomatology 3.  Urethral lesion  Postop diagnosis: Same  Principal procedure: Cystoscopy, biopsy of prostatic urethral lesion, Urolift placement (5 implants, one pulled through), placement of Space OAR  Drains: 18 French Foley catheter  Surgeon: Charnika Herbst specimen: Prostatic urethral biopsy  Radiation oncologist: Tammi Klippel  Anesthesia: General with LMA  Complications: None  Indications: 72 year old male with recently diagnosed high risk prostate cancer.  He has initiated androgen deprivation therapy and will begin external beam radiotherapy in the near future.  He has significant lower urinary tract symptoms and an obstructive but small prostate.  He presents at this time for cystoscopy, biopsy of a small urethral lesion seen cystoscopically preoperatively, Urolift placement and placement of Space OAR.  I discussed the procedure as well as risks and complications with the patient and his wife.  These include but are not limited to infection, bleeding, in adequate management of his lower urinary tract symptomatology, need for catheter placement, among others.  He understands these and desires to proceed.  Description of procedure: The patient was properly identified in the holding area.  He was taken to the operating room where general anesthetic was administered with the LMA.  He was placed in the dorsolithotomy position.  Genitalia and perineum were prepped and draped.  Proper timeout was performed.  33 French panendoscope was advanced through the urethra.  Anterior urethra was normal.  There was a very small polypoid area on the verumontanum.  There was mild obstruction of the lateral lobes of the prostate with a prominent but small median lobe.  The bladder was entered and inspected circumferentially.  Mild to  moderate trabeculations noted.  No urethral lesions.  Ureteral orifices normal in configuration location.  The scope was brought back into the prostatic urethra and cold cup biopsies were utilized to biopsy the small area on the verumontanum.  2 biopsies were taken.  Following this, the biopsy specimen was sent for pathology.  The urothelium was cauterized in this area with the Bugbee electrode.  I then placed the 17 French sheath using the visual obturator.  Urolift implants were first placed at the right and left proximal urethra, at the 9 and 3 o'clock position, approximately 1.5 cm from the bladder neck.  2 other implants were placed, both at the level of the verumontanum, one on the right and one on the left.  Another implant was placed near the right proximal urethral implant, but aimed at the 10 o'clock position.  This improved the patient's "fishmouth" bladder neck.  A 6 implant was attempted be placed at a corresponding area on the left at 2:00.  However, the capsular tab never deployed, and the implant was aborted.  Altogether, 5 implants were placed, a 6 implant failed.  At this point, the scope was removed.  The bladder was drained.  Dr. Tammi Klippel then placed the ultrasound probe in the rectum.  The prostate was scanned.  A spinal needle was utilized to enter the small space/white line posterior to the prostate but anterior to the rectum.  Once properly positioned in the mid prostate, with sagittal and transverse images confirming adequate placement, small puffs of saline were placed.  Once adequate positioning was identified, the Space OAR was injected over a period of 10 seconds.  Following placement, the needle was withdrawn.  An 26 French Foley catheter was then placed and hooked  to dependent drainage.  At this point, the procedures were terminated.  The patient was awakened and taken to the PACU in stable condition.  He tolerated the procedure well.

## 2017-12-15 NOTE — Discharge Instructions (Signed)
1. You may see some blood in the urine and may have some burning with urination for 48-72 hours. You also may notice that you have to urinate more frequently or urgently after your procedure which is normal.  2. You should call should you develop an inability urinate, fever > 101, persistent nausea and vomiting that prevents you from eating or drinking to stay hydrated.  3. If you have a stent, you will likely urinate more frequently and urgently until the stent is removed and you may experience some discomfort/pain in the lower abdomen and flank especially when urinating. You may take pain medication prescribed to you if needed for pain. You may also intermittently have blood in the urine until the stent is removed. 4. If you have a catheter, you will be taught how to take care of the catheter by the nursing staff prior to discharge from the hospital.  You may periodically feel a strong urge to void with the catheter in place.  This is a bladder spasm and most often can occur when having a bowel movement or moving around. It is typically self-limited and usually will stop after a few minutes.  You may use some Vaseline or Neosporin around the tip of the catheter to reduce friction at the tip of the penis. You may also see some blood in the urine.  A very small amount of blood can make the urine look quite red.  As long as the catheter is draining well, there usually is not a problem.  However, if the catheter is not draining well and is bloody, you should call the office [phone]) to notify us.  It is okay to remove the catheter as instructed on Thursday morning.  Post Anesthesia Home Care Instructions  Activity: Get plenty of rest for the remainder of the day. A responsible individual must stay with you for 24 hours following the procedure.  For the next 24 hours, DO NOT: -Drive a car -Paediatric nurse -Drink alcoholic beverages -Take any medication unless instructed by your physician -Make any  legal decisions or sign important papers.  Meals: Start with liquid foods such as gelatin or soup. Progress to regular foods as tolerated. Avoid greasy, spicy, heavy foods. If nausea and/or vomiting occur, drink only clear liquids until the nausea and/or vomiting subsides. Call your physician if vomiting continues.  Special Instructions/Symptoms: Your throat may feel dry or sore from the anesthesia or the breathing tube placed in your throat during surgery. If this causes discomfort, gargle with warm salt water. The discomfort should disappear within 24 hours.

## 2017-12-15 NOTE — Anesthesia Postprocedure Evaluation (Signed)
Anesthesia Post Note  Patient: Manuel Landry  Procedure(s) Performed: SPACE OAR INSTILLATION (N/A Rectum) CYSTOSCOPY WITH INSERTION OF UROLIFT, BIOPSY  PROSTATIC URETHAL (N/A Prostate)     Patient location during evaluation: PACU Anesthesia Type: General Level of consciousness: awake and alert Pain management: pain level controlled Vital Signs Assessment: post-procedure vital signs reviewed and stable Respiratory status: spontaneous breathing, nonlabored ventilation, respiratory function stable and patient connected to nasal cannula oxygen Cardiovascular status: blood pressure returned to baseline and stable Postop Assessment: no apparent nausea or vomiting Anesthetic complications: no    Last Vitals:  Vitals:   12/15/17 1145 12/15/17 1200  BP: 102/74 98/69  Pulse: 73 78  Resp: (!) 8 11  Temp: 36.6 C   SpO2: 98% 97%    Last Pain:  Vitals:   12/15/17 1145  TempSrc:   PainSc: 2                  Cambelle Suchecki S

## 2017-12-15 NOTE — Transfer of Care (Signed)
Immediate Anesthesia Transfer of Care Note  Patient: Manuel Landry  Procedure(s) Performed: SPACE OAR INSTILLATION (N/A Rectum) CYSTOSCOPY WITH INSERTION OF UROLIFT, BIOPSY  PROSTATIC URETHAL (N/A Prostate)  Patient Location: PACU  Anesthesia Type:General  Level of Consciousness: awake, alert  and oriented  Airway & Oxygen Therapy: Patient Spontanous Breathing and Patient connected to nasal cannula oxygen  Post-op Assessment: Report given to RN  Post vital signs: Reviewed and stable  Last Vitals:  Vitals Value Taken Time  BP 102/74 12/15/2017 11:45 AM  Temp    Pulse 73 12/15/2017 11:48 AM  Resp 9 12/15/2017 11:48 AM  SpO2 100 % 12/15/2017 11:48 AM  Vitals shown include unvalidated device data.  Last Pain:  Vitals:   12/15/17 0843  TempSrc: Oral         Complications: No apparent anesthesia complications

## 2017-12-15 NOTE — H&P (Signed)
H&P  Chief Complaint: prostate cancer  History of Present Illness: 72 year old male presents today for cystoscopy, urethral biopsy, Urolift placement, as well as placement of SpaceOAR.  The patient has high risk prostate cancer, a urethral lesion adjacent to the prostatic urethra as well as obstructive symptoms from his prostate.  He is eventually scheduled for external beam radiotherapy.  Past Medical History:  Diagnosis Date  . Anxiety   . Arthritis   . Blind loop syndrome   . Bulging discs    LOWER BACK  . Diverticulosis   . Family history of malignant neoplasm of gastrointestinal tract   . History of adenomatous polyp of colon   . History of Barrett's esophagus   . History of chronic gastritis   . Hyperlipidemia   . IBS (irritable bowel syndrome)   . Malignant neoplasm of prostate [facility]) urologist-  dr Kaelah Hayashi/  oncologist-  dr Tammi Klippel   dx 08-09-2017-- Stage T2a, Gleason 4+5, PSA 3.22 (on finateride)--- treatment ADT and external beam radiation  . PONV (postoperative nausea and vomiting)   . Seasonal allergic rhinitis   . Wears glasses     Past Surgical History:  Procedure Laterality Date  . COLONOSCOPY  last one 10-21-2016  dr pyrtle  . ESOPHAGOGASTRODUODENOSCOPY (EGD) WITH PROPOFOL  last one 09-26-2013   dr pyrtle  . INGUINAL HERNIA REPAIR Bilateral right 10-25-1997:  left 09-28-2008 (dr m. Hassell Done @ [facility])  . LAPAROSCOPIC CHOLECYSTECTOMY  2012  . MANDIBLE SURGERY Bilateral 1990s   removal calcified bone growths  . ROTATOR CUFF REPAIR Right 03-26-2009   dr Tonita Cong  [facility]  . SHOULDER OPEN ROTATOR CUFF REPAIR  03/17/2012   Procedure: ROTATOR CUFF REPAIR SHOULDER OPEN;  Surgeon: Johnn Hai, MD;  Location: [facility];  Service: Orthopedics;  Laterality: Left;  LEFT MINI OPEN ROTATOR CUFF REPAIR   . TONSILLECTOMY  child    Home Medications:  Allergies as of 12/15/2017   No Known Allergies     Medication List    Notice   Cannot display discharge medications because the  patient has not yet been admitted.     Allergies: No Known Allergies  Family History  Problem Relation Age of Onset  . Colon cancer Mother 20  . Diabetes Maternal Grandmother   . COPD Sister   . Heart failure Father 75  . Esophageal cancer Neg Hx   . Rectal cancer Neg Hx   . Stomach cancer Neg Hx     Social History:  reports that he has never smoked. He has never used smokeless tobacco. He reports that he drinks alcohol. He reports that he does not use drugs.  ROS: A complete review of systems was performed.  All systems are negative except for pertinent findings as noted.  Physical Exam:  Vital signs in last 24 hours:   Constitutional:  Alert and oriented, No acute distress Cardiovascular: Regular rate  Respiratory: Normal respiratory effort GI: Abdomen is soft, nontender, nondistended, no abdominal masses Genitourinary: No CVAT. Normal male phallus, testes are descended bilaterally and non-tender and without masses, scrotum is normal in appearance without lesions or masses, perineum is normal on inspection. Lymphatic: No lymphadenopathy Neurologic: Grossly intact, no focal deficits Psychiatric: Normal mood and affect  Laboratory Data:  No results for input(s): WBC, HGB, HCT, PLT in the last 72 hours.  No results for input(s): NA, K, CL, GLUCOSE, BUN, CALCIUM, CREATININE in the last 72 hours.  Invalid input(s): CO3   No results found for this or any previous  visit (from the past 24 hour(s)). No results found for this or any previous visit (from the past 240 hour(s)).  Renal Function: No results for input(s): CREATININE in the last 168 hours. CrCl cannot be calculated (Patient's most recent lab result is older than the maximum 21 days allowed.).  Radiologic Imaging: No results found.  Impression/Assessment:  High-risk prostate cancer, obstructive symptomatology, urethral lesion  Plan:  Cystoscopy, urethral biopsy, Urolift placement, Space OAR  placement

## 2017-12-15 NOTE — Anesthesia Procedure Notes (Signed)
Procedure Name: LMA Insertion Date/Time: 12/15/2017 11:03 AM Performed by: Bonney Aid, CRNA Pre-anesthesia Checklist: Patient identified, Emergency Drugs available, Suction available and Patient being monitored Patient Re-evaluated:Patient Re-evaluated prior to induction Oxygen Delivery Method: Circle system utilized Preoxygenation: Pre-oxygenation with 100% oxygen Induction Type: IV induction Ventilation: Mask ventilation without difficulty LMA: LMA inserted LMA Size: 5.0 Number of attempts: 1 Airway Equipment and Method: Bite block Placement Confirmation: positive ETCO2 Tube secured with: Tape Dental Injury: Teeth and Oropharynx as per pre-operative assessment

## 2017-12-15 NOTE — Anesthesia Preprocedure Evaluation (Signed)
Anesthesia Evaluation  Patient identified by MRN, date of birth, ID band Patient awake    Reviewed: Allergy & Precautions, NPO status , Patient's Chart, lab work & pertinent test results  History of Anesthesia Complications (+) PONV  Airway Mallampati: II  TM Distance: >3 FB Neck ROM: Full    Dental no notable dental hx.    Pulmonary neg pulmonary ROS,    Pulmonary exam normal breath sounds clear to auscultation       Cardiovascular negative cardio ROS Normal cardiovascular exam Rhythm:Regular Rate:Normal     Neuro/Psych negative neurological ROS  negative psych ROS   GI/Hepatic Neg liver ROS, GERD  ,  Endo/Other  negative endocrine ROS  Renal/GU negative Renal ROS  negative genitourinary   Musculoskeletal negative musculoskeletal ROS (+)   Abdominal   Peds negative pediatric ROS (+)  Hematology negative hematology ROS (+)   Anesthesia Other Findings   Reproductive/Obstetrics negative OB ROS                             Anesthesia Physical Anesthesia Plan  ASA: II  Anesthesia Plan: General   Post-op Pain Management:    Induction: Intravenous  PONV Risk Score and Plan: 3 and Ondansetron, Dexamethasone and Treatment may vary due to age or medical condition  Airway Management Planned: LMA  Additional Equipment:   Intra-op Plan:   Post-operative Plan: Extubation in OR  Informed Consent: I have reviewed the patients History and Physical, chart, labs and discussed the procedure including the risks, benefits and alternatives for the proposed anesthesia with the patient or authorized representative who has indicated his/her understanding and acceptance.   Dental advisory given  Plan Discussed with: CRNA and Surgeon  Anesthesia Plan Comments:         Anesthesia Quick Evaluation

## 2017-12-17 ENCOUNTER — Ambulatory Visit: Payer: Medicare Other | Admitting: Radiation Oncology

## 2017-12-17 ENCOUNTER — Encounter (HOSPITAL_BASED_OUTPATIENT_CLINIC_OR_DEPARTMENT_OTHER): Payer: Self-pay | Admitting: Urology

## 2017-12-28 DIAGNOSIS — H2513 Age-related nuclear cataract, bilateral: Secondary | ICD-10-CM | POA: Diagnosis not present

## 2017-12-30 NOTE — Progress Notes (Signed)
  Radiation Oncology         [phone] ________________________________  Name: Manuel Landry MRN: [id number]  Date: 12/31/2017  DOB: August 05, 1945  SIMULATION AND TREATMENT PLANNING NOTE    ICD-10-CM   1. Malignant neoplasm of prostate ([place]) C61     DIAGNOSIS:  72 y.o. gentleman with stage T2a-b adenocarcinoma of the prostate with a Gleason's score of 4+5 and a PSA of 3.22 (on Finasteride)  NARRATIVE:  The patient was brought to the [facility].  Identity was confirmed.  All relevant records and images related to the planned course of therapy were reviewed.  The patient freely provided informed written consent to proceed with treatment after reviewing the details related to the planned course of therapy. The consent form was witnessed and verified by the simulation staff.  Then, the patient was set-up in a stable reproducible supine position for radiation therapy.  A vacuum lock pillow device was custom fabricated to position his legs in a reproducible immobilized position.  Then, I performed a urethrogram under sterile conditions to identify the prostatic apex.  CT images were obtained.  Surface markings were placed.  The CT images were loaded into the planning software.  Then the prostate target and avoidance structures including the rectum, bladder, bowel and hips were contoured.  Treatment planning then occurred.  The radiation prescription was entered and confirmed.  A total of one complex treatment devices were fabricated. I have requested : Intensity Modulated Radiotherapy (IMRT) is medically necessary for this case for the following reason:  Rectal sparing.Marland Kitchen  PLAN:  The patient will receive 45 Gy in 25 fractions of 1.8 Gy, followed by a boost to the prostate to a total dose of 75 Gy with 15 additional fractions of 2.0 Gy.  ________________________________  Sheral Apley Tammi Klippel, M.D.

## 2017-12-31 ENCOUNTER — Encounter: Payer: Self-pay | Admitting: Medical Oncology

## 2017-12-31 ENCOUNTER — Ambulatory Visit
Admission: RE | Admit: 2017-12-31 | Discharge: 2017-12-31 | Disposition: A | Discharge status: Home / Self Care | Payer: Medicare Other | Source: Ambulatory Visit | Attending: Radiation Oncology | Admitting: Radiation Oncology

## 2017-12-31 ENCOUNTER — Telehealth: Payer: Self-pay | Admitting: *Deleted

## 2017-12-31 DIAGNOSIS — Z510 Encounter for antineoplastic radiation therapy: Secondary | ICD-10-CM | POA: Insufficient documentation

## 2017-12-31 DIAGNOSIS — C61 Malignant neoplasm of prostate: Secondary | ICD-10-CM | POA: Diagnosis not present

## 2017-12-31 NOTE — Telephone Encounter (Signed)
Called patient to inform of MRI for 01-04-18 - arrival time - 8:30 am @ [facility], no restrictions to test, lvm for a return call

## 2018-01-04 ENCOUNTER — Ambulatory Visit (HOSPITAL_COMMUNITY)
Admission: RE | Admit: 2018-01-04 | Discharge: 2018-01-04 | Disposition: A | Discharge status: Home / Self Care | Payer: Medicare Other | Source: Ambulatory Visit | Attending: Urology | Admitting: Urology

## 2018-01-04 DIAGNOSIS — C61 Malignant neoplasm of prostate: Secondary | ICD-10-CM | POA: Diagnosis not present

## 2018-01-10 DIAGNOSIS — Z510 Encounter for antineoplastic radiation therapy: Secondary | ICD-10-CM | POA: Diagnosis not present

## 2018-01-10 DIAGNOSIS — C61 Malignant neoplasm of prostate: Secondary | ICD-10-CM | POA: Diagnosis not present

## 2018-01-11 ENCOUNTER — Encounter: Payer: Self-pay | Admitting: Medical Oncology

## 2018-01-11 ENCOUNTER — Ambulatory Visit
Admission: RE | Admit: 2018-01-11 | Discharge: 2018-01-11 | Disposition: A | Discharge status: Home / Self Care | Payer: Medicare Other | Source: Ambulatory Visit | Attending: Radiation Oncology | Admitting: Radiation Oncology

## 2018-01-11 DIAGNOSIS — C61 Malignant neoplasm of prostate: Secondary | ICD-10-CM | POA: Diagnosis not present

## 2018-01-11 DIAGNOSIS — Z510 Encounter for antineoplastic radiation therapy: Secondary | ICD-10-CM | POA: Diagnosis not present

## 2018-01-12 ENCOUNTER — Ambulatory Visit
Admission: RE | Admit: 2018-01-12 | Discharge: 2018-01-12 | Disposition: A | Discharge status: Home / Self Care | Payer: Medicare Other | Source: Ambulatory Visit | Attending: Radiation Oncology | Admitting: Radiation Oncology

## 2018-01-12 DIAGNOSIS — C61 Malignant neoplasm of prostate: Secondary | ICD-10-CM | POA: Diagnosis not present

## 2018-01-12 DIAGNOSIS — Z510 Encounter for antineoplastic radiation therapy: Secondary | ICD-10-CM | POA: Diagnosis not present

## 2018-01-13 ENCOUNTER — Ambulatory Visit
Admission: RE | Admit: 2018-01-13 | Discharge: 2018-01-13 | Disposition: A | Discharge status: Home / Self Care | Payer: Medicare Other | Source: Ambulatory Visit | Attending: Radiation Oncology | Admitting: Radiation Oncology

## 2018-01-13 DIAGNOSIS — Z510 Encounter for antineoplastic radiation therapy: Secondary | ICD-10-CM | POA: Diagnosis not present

## 2018-01-13 DIAGNOSIS — C61 Malignant neoplasm of prostate: Secondary | ICD-10-CM | POA: Diagnosis not present

## 2018-01-14 ENCOUNTER — Ambulatory Visit
Admission: RE | Admit: 2018-01-14 | Discharge: 2018-01-14 | Disposition: A | Discharge status: Home / Self Care | Payer: Medicare Other | Source: Ambulatory Visit | Attending: Radiation Oncology | Admitting: Radiation Oncology

## 2018-01-14 DIAGNOSIS — Z510 Encounter for antineoplastic radiation therapy: Secondary | ICD-10-CM | POA: Diagnosis not present

## 2018-01-14 DIAGNOSIS — C61 Malignant neoplasm of prostate: Secondary | ICD-10-CM | POA: Diagnosis not present

## 2018-01-18 ENCOUNTER — Ambulatory Visit
Admission: RE | Admit: 2018-01-18 | Discharge: 2018-01-18 | Disposition: A | Discharge status: Home / Self Care | Payer: Medicare Other | Source: Ambulatory Visit | Attending: Radiation Oncology | Admitting: Radiation Oncology

## 2018-01-18 DIAGNOSIS — Z510 Encounter for antineoplastic radiation therapy: Secondary | ICD-10-CM | POA: Insufficient documentation

## 2018-01-18 DIAGNOSIS — C61 Malignant neoplasm of prostate: Secondary | ICD-10-CM | POA: Diagnosis not present

## 2018-01-19 ENCOUNTER — Ambulatory Visit
Admission: RE | Admit: 2018-01-19 | Discharge: 2018-01-19 | Disposition: A | Discharge status: Home / Self Care | Payer: Medicare Other | Source: Ambulatory Visit | Attending: Radiation Oncology | Admitting: Radiation Oncology

## 2018-01-19 DIAGNOSIS — Z510 Encounter for antineoplastic radiation therapy: Secondary | ICD-10-CM | POA: Diagnosis not present

## 2018-01-19 DIAGNOSIS — C61 Malignant neoplasm of prostate: Secondary | ICD-10-CM | POA: Diagnosis not present

## 2018-01-20 ENCOUNTER — Ambulatory Visit
Admission: RE | Admit: 2018-01-20 | Discharge: 2018-01-20 | Disposition: A | Discharge status: Home / Self Care | Payer: Medicare Other | Source: Ambulatory Visit | Attending: Radiation Oncology | Admitting: Radiation Oncology

## 2018-01-20 DIAGNOSIS — C61 Malignant neoplasm of prostate: Secondary | ICD-10-CM | POA: Diagnosis not present

## 2018-01-20 DIAGNOSIS — Z510 Encounter for antineoplastic radiation therapy: Secondary | ICD-10-CM | POA: Diagnosis not present

## 2018-01-21 ENCOUNTER — Other Ambulatory Visit: Payer: Self-pay | Admitting: Radiation Oncology

## 2018-01-21 ENCOUNTER — Ambulatory Visit
Admission: RE | Admit: 2018-01-21 | Discharge: 2018-01-21 | Disposition: A | Discharge status: Home / Self Care | Payer: Medicare Other | Source: Ambulatory Visit | Attending: Radiation Oncology | Admitting: Radiation Oncology

## 2018-01-21 DIAGNOSIS — C61 Malignant neoplasm of prostate: Secondary | ICD-10-CM | POA: Diagnosis not present

## 2018-01-21 DIAGNOSIS — Z510 Encounter for antineoplastic radiation therapy: Secondary | ICD-10-CM | POA: Diagnosis not present

## 2018-01-24 ENCOUNTER — Ambulatory Visit
Admission: RE | Admit: 2018-01-24 | Discharge: 2018-01-24 | Disposition: A | Discharge status: Home / Self Care | Payer: Medicare Other | Source: Ambulatory Visit | Attending: Radiation Oncology | Admitting: Radiation Oncology

## 2018-01-24 DIAGNOSIS — C61 Malignant neoplasm of prostate: Secondary | ICD-10-CM | POA: Diagnosis not present

## 2018-01-24 DIAGNOSIS — Z510 Encounter for antineoplastic radiation therapy: Secondary | ICD-10-CM | POA: Diagnosis not present

## 2018-01-25 ENCOUNTER — Ambulatory Visit
Admission: RE | Admit: 2018-01-25 | Discharge: 2018-01-25 | Disposition: A | Discharge status: Home / Self Care | Payer: Medicare Other | Source: Ambulatory Visit | Attending: Radiation Oncology | Admitting: Radiation Oncology

## 2018-01-25 DIAGNOSIS — Z510 Encounter for antineoplastic radiation therapy: Secondary | ICD-10-CM | POA: Diagnosis not present

## 2018-01-25 DIAGNOSIS — C61 Malignant neoplasm of prostate: Secondary | ICD-10-CM | POA: Diagnosis not present

## 2018-01-26 ENCOUNTER — Ambulatory Visit
Admission: RE | Admit: 2018-01-26 | Discharge: 2018-01-26 | Disposition: A | Discharge status: Home / Self Care | Payer: Medicare Other | Source: Ambulatory Visit | Attending: Radiation Oncology | Admitting: Radiation Oncology

## 2018-01-26 DIAGNOSIS — Z510 Encounter for antineoplastic radiation therapy: Secondary | ICD-10-CM | POA: Diagnosis not present

## 2018-01-26 DIAGNOSIS — C61 Malignant neoplasm of prostate: Secondary | ICD-10-CM | POA: Diagnosis not present

## 2018-01-27 ENCOUNTER — Ambulatory Visit
Admission: RE | Admit: 2018-01-27 | Discharge: 2018-01-27 | Disposition: A | Discharge status: Home / Self Care | Payer: Medicare Other | Source: Ambulatory Visit | Attending: Radiation Oncology | Admitting: Radiation Oncology

## 2018-01-27 DIAGNOSIS — Z510 Encounter for antineoplastic radiation therapy: Secondary | ICD-10-CM | POA: Diagnosis not present

## 2018-01-27 DIAGNOSIS — C61 Malignant neoplasm of prostate: Secondary | ICD-10-CM | POA: Diagnosis not present

## 2018-01-28 ENCOUNTER — Ambulatory Visit
Admission: RE | Admit: 2018-01-28 | Discharge: 2018-01-28 | Disposition: A | Discharge status: Home / Self Care | Payer: Medicare Other | Source: Ambulatory Visit | Attending: Radiation Oncology | Admitting: Radiation Oncology

## 2018-01-28 DIAGNOSIS — Z510 Encounter for antineoplastic radiation therapy: Secondary | ICD-10-CM | POA: Diagnosis not present

## 2018-01-28 DIAGNOSIS — C61 Malignant neoplasm of prostate: Secondary | ICD-10-CM | POA: Diagnosis not present

## 2018-01-31 ENCOUNTER — Ambulatory Visit
Admission: RE | Admit: 2018-01-31 | Discharge: 2018-01-31 | Disposition: A | Discharge status: Home / Self Care | Payer: Medicare Other | Source: Ambulatory Visit | Attending: Radiation Oncology | Admitting: Radiation Oncology

## 2018-01-31 DIAGNOSIS — C61 Malignant neoplasm of prostate: Secondary | ICD-10-CM | POA: Diagnosis not present

## 2018-01-31 DIAGNOSIS — Z510 Encounter for antineoplastic radiation therapy: Secondary | ICD-10-CM | POA: Diagnosis not present

## 2018-02-01 ENCOUNTER — Ambulatory Visit
Admission: RE | Admit: 2018-02-01 | Discharge: 2018-02-01 | Disposition: A | Discharge status: Home / Self Care | Payer: Medicare Other | Source: Ambulatory Visit | Attending: Radiation Oncology | Admitting: Radiation Oncology

## 2018-02-01 DIAGNOSIS — Z510 Encounter for antineoplastic radiation therapy: Secondary | ICD-10-CM | POA: Diagnosis not present

## 2018-02-01 DIAGNOSIS — C61 Malignant neoplasm of prostate: Secondary | ICD-10-CM | POA: Diagnosis not present

## 2018-02-02 ENCOUNTER — Ambulatory Visit
Admission: RE | Admit: 2018-02-02 | Discharge: 2018-02-02 | Disposition: A | Discharge status: Home / Self Care | Payer: Medicare Other | Source: Ambulatory Visit | Attending: Radiation Oncology | Admitting: Radiation Oncology

## 2018-02-02 DIAGNOSIS — Z510 Encounter for antineoplastic radiation therapy: Secondary | ICD-10-CM | POA: Diagnosis not present

## 2018-02-02 DIAGNOSIS — C61 Malignant neoplasm of prostate: Secondary | ICD-10-CM | POA: Diagnosis not present

## 2018-02-03 ENCOUNTER — Ambulatory Visit
Admission: RE | Admit: 2018-02-03 | Discharge: 2018-02-03 | Disposition: A | Discharge status: Home / Self Care | Payer: Medicare Other | Source: Ambulatory Visit | Attending: Radiation Oncology | Admitting: Radiation Oncology

## 2018-02-03 DIAGNOSIS — Z510 Encounter for antineoplastic radiation therapy: Secondary | ICD-10-CM | POA: Diagnosis not present

## 2018-02-03 DIAGNOSIS — C61 Malignant neoplasm of prostate: Secondary | ICD-10-CM | POA: Diagnosis not present

## 2018-02-04 ENCOUNTER — Ambulatory Visit
Admission: RE | Admit: 2018-02-04 | Discharge: 2018-02-04 | Disposition: A | Discharge status: Home / Self Care | Payer: Medicare Other | Source: Ambulatory Visit | Attending: Radiation Oncology | Admitting: Radiation Oncology

## 2018-02-04 DIAGNOSIS — C61 Malignant neoplasm of prostate: Secondary | ICD-10-CM | POA: Diagnosis not present

## 2018-02-04 DIAGNOSIS — Z510 Encounter for antineoplastic radiation therapy: Secondary | ICD-10-CM | POA: Diagnosis not present

## 2018-02-07 ENCOUNTER — Ambulatory Visit
Admission: RE | Admit: 2018-02-07 | Discharge: 2018-02-07 | Disposition: A | Discharge status: Home / Self Care | Payer: Medicare Other | Source: Ambulatory Visit | Attending: Radiation Oncology | Admitting: Radiation Oncology

## 2018-02-07 DIAGNOSIS — Z510 Encounter for antineoplastic radiation therapy: Secondary | ICD-10-CM | POA: Diagnosis not present

## 2018-02-07 DIAGNOSIS — C61 Malignant neoplasm of prostate: Secondary | ICD-10-CM | POA: Diagnosis not present

## 2018-02-08 ENCOUNTER — Ambulatory Visit
Admission: RE | Admit: 2018-02-08 | Discharge: 2018-02-08 | Disposition: A | Discharge status: Home / Self Care | Payer: Medicare Other | Source: Ambulatory Visit | Attending: Radiation Oncology | Admitting: Radiation Oncology

## 2018-02-08 DIAGNOSIS — C61 Malignant neoplasm of prostate: Secondary | ICD-10-CM | POA: Diagnosis not present

## 2018-02-08 DIAGNOSIS — Z510 Encounter for antineoplastic radiation therapy: Secondary | ICD-10-CM | POA: Diagnosis not present

## 2018-02-09 ENCOUNTER — Ambulatory Visit
Admission: RE | Admit: 2018-02-09 | Discharge: 2018-02-09 | Disposition: A | Discharge status: Home / Self Care | Payer: Medicare Other | Source: Ambulatory Visit | Attending: Radiation Oncology | Admitting: Radiation Oncology

## 2018-02-09 DIAGNOSIS — C61 Malignant neoplasm of prostate: Secondary | ICD-10-CM | POA: Diagnosis not present

## 2018-02-09 DIAGNOSIS — Z510 Encounter for antineoplastic radiation therapy: Secondary | ICD-10-CM | POA: Diagnosis not present

## 2018-02-10 ENCOUNTER — Ambulatory Visit
Admission: RE | Admit: 2018-02-10 | Discharge: 2018-02-10 | Disposition: A | Discharge status: Home / Self Care | Payer: Medicare Other | Source: Ambulatory Visit | Attending: Radiation Oncology | Admitting: Radiation Oncology

## 2018-02-10 DIAGNOSIS — C61 Malignant neoplasm of prostate: Secondary | ICD-10-CM | POA: Diagnosis not present

## 2018-02-10 DIAGNOSIS — Z510 Encounter for antineoplastic radiation therapy: Secondary | ICD-10-CM | POA: Diagnosis not present

## 2018-02-11 ENCOUNTER — Encounter: Payer: Self-pay | Admitting: Medical Oncology

## 2018-02-11 ENCOUNTER — Ambulatory Visit
Admission: RE | Admit: 2018-02-11 | Discharge: 2018-02-11 | Disposition: A | Discharge status: Home / Self Care | Payer: Medicare Other | Source: Ambulatory Visit | Attending: Radiation Oncology | Admitting: Radiation Oncology

## 2018-02-11 DIAGNOSIS — Z510 Encounter for antineoplastic radiation therapy: Secondary | ICD-10-CM | POA: Diagnosis not present

## 2018-02-11 DIAGNOSIS — C61 Malignant neoplasm of prostate: Secondary | ICD-10-CM | POA: Diagnosis not present

## 2018-02-14 ENCOUNTER — Ambulatory Visit
Admission: RE | Admit: 2018-02-14 | Discharge: 2018-02-14 | Disposition: A | Discharge status: Home / Self Care | Payer: Medicare Other | Source: Ambulatory Visit | Attending: Radiation Oncology | Admitting: Radiation Oncology

## 2018-02-14 DIAGNOSIS — Z510 Encounter for antineoplastic radiation therapy: Secondary | ICD-10-CM | POA: Diagnosis not present

## 2018-02-14 DIAGNOSIS — C61 Malignant neoplasm of prostate: Secondary | ICD-10-CM | POA: Diagnosis not present

## 2018-02-15 ENCOUNTER — Ambulatory Visit
Admission: RE | Admit: 2018-02-15 | Discharge: 2018-02-15 | Disposition: A | Discharge status: Home / Self Care | Payer: Medicare Other | Source: Ambulatory Visit | Attending: Radiation Oncology | Admitting: Radiation Oncology

## 2018-02-15 DIAGNOSIS — Z510 Encounter for antineoplastic radiation therapy: Secondary | ICD-10-CM | POA: Insufficient documentation

## 2018-02-15 DIAGNOSIS — C61 Malignant neoplasm of prostate: Secondary | ICD-10-CM | POA: Insufficient documentation

## 2018-02-16 ENCOUNTER — Ambulatory Visit
Admission: RE | Admit: 2018-02-16 | Discharge: 2018-02-16 | Disposition: A | Discharge status: Home / Self Care | Payer: Medicare Other | Source: Ambulatory Visit | Attending: Radiation Oncology | Admitting: Radiation Oncology

## 2018-02-16 DIAGNOSIS — C61 Malignant neoplasm of prostate: Secondary | ICD-10-CM | POA: Diagnosis not present

## 2018-02-16 DIAGNOSIS — Z510 Encounter for antineoplastic radiation therapy: Secondary | ICD-10-CM | POA: Diagnosis not present

## 2018-02-17 ENCOUNTER — Ambulatory Visit
Admission: RE | Admit: 2018-02-17 | Discharge: 2018-02-17 | Disposition: A | Discharge status: Home / Self Care | Payer: Medicare Other | Source: Ambulatory Visit | Attending: Radiation Oncology | Admitting: Radiation Oncology

## 2018-02-17 DIAGNOSIS — C61 Malignant neoplasm of prostate: Secondary | ICD-10-CM | POA: Diagnosis not present

## 2018-02-17 DIAGNOSIS — Z510 Encounter for antineoplastic radiation therapy: Secondary | ICD-10-CM | POA: Diagnosis not present

## 2018-02-18 ENCOUNTER — Ambulatory Visit
Admission: RE | Admit: 2018-02-18 | Discharge: 2018-02-18 | Disposition: A | Discharge status: Home / Self Care | Payer: Medicare Other | Source: Ambulatory Visit | Attending: Radiation Oncology | Admitting: Radiation Oncology

## 2018-02-18 DIAGNOSIS — C61 Malignant neoplasm of prostate: Secondary | ICD-10-CM | POA: Diagnosis not present

## 2018-02-18 DIAGNOSIS — Z510 Encounter for antineoplastic radiation therapy: Secondary | ICD-10-CM | POA: Diagnosis not present

## 2018-02-21 ENCOUNTER — Ambulatory Visit
Admission: RE | Admit: 2018-02-21 | Discharge: 2018-02-21 | Disposition: A | Discharge status: Home / Self Care | Payer: Medicare Other | Source: Ambulatory Visit | Attending: Radiation Oncology | Admitting: Radiation Oncology

## 2018-02-21 DIAGNOSIS — Z510 Encounter for antineoplastic radiation therapy: Secondary | ICD-10-CM | POA: Diagnosis not present

## 2018-02-21 DIAGNOSIS — C61 Malignant neoplasm of prostate: Secondary | ICD-10-CM | POA: Diagnosis not present

## 2018-02-22 ENCOUNTER — Ambulatory Visit
Admission: RE | Admit: 2018-02-22 | Discharge: 2018-02-22 | Disposition: A | Discharge status: Home / Self Care | Payer: Medicare Other | Source: Ambulatory Visit | Attending: Radiation Oncology | Admitting: Radiation Oncology

## 2018-02-22 DIAGNOSIS — C61 Malignant neoplasm of prostate: Secondary | ICD-10-CM | POA: Diagnosis not present

## 2018-02-22 DIAGNOSIS — Z510 Encounter for antineoplastic radiation therapy: Secondary | ICD-10-CM | POA: Diagnosis not present

## 2018-02-23 ENCOUNTER — Ambulatory Visit
Admission: RE | Admit: 2018-02-23 | Discharge: 2018-02-23 | Disposition: A | Discharge status: Home / Self Care | Payer: Medicare Other | Source: Ambulatory Visit | Attending: Radiation Oncology | Admitting: Radiation Oncology

## 2018-02-23 DIAGNOSIS — C61 Malignant neoplasm of prostate: Secondary | ICD-10-CM | POA: Diagnosis not present

## 2018-02-23 DIAGNOSIS — Z510 Encounter for antineoplastic radiation therapy: Secondary | ICD-10-CM | POA: Diagnosis not present

## 2018-02-24 ENCOUNTER — Ambulatory Visit
Admission: RE | Admit: 2018-02-24 | Discharge: 2018-02-24 | Disposition: A | Discharge status: Home / Self Care | Payer: Medicare Other | Source: Ambulatory Visit | Attending: Radiation Oncology | Admitting: Radiation Oncology

## 2018-02-24 ENCOUNTER — Other Ambulatory Visit: Payer: Self-pay | Admitting: Radiation Oncology

## 2018-02-24 DIAGNOSIS — C61 Malignant neoplasm of prostate: Secondary | ICD-10-CM | POA: Diagnosis not present

## 2018-02-24 DIAGNOSIS — Z510 Encounter for antineoplastic radiation therapy: Secondary | ICD-10-CM | POA: Diagnosis not present

## 2018-02-24 MED ORDER — PHENAZOPYRIDINE HCL 200 MG PO TABS: 200.0000 mg | ORAL_TABLET | Freq: Three times a day (TID) | ORAL | 2 refills | Status: DC | PRN

## 2018-02-25 ENCOUNTER — Ambulatory Visit
Admission: RE | Admit: 2018-02-25 | Discharge: 2018-02-25 | Disposition: A | Discharge status: Home / Self Care | Payer: Medicare Other | Source: Ambulatory Visit | Attending: Radiation Oncology | Admitting: Radiation Oncology

## 2018-02-25 DIAGNOSIS — Z510 Encounter for antineoplastic radiation therapy: Secondary | ICD-10-CM | POA: Diagnosis not present

## 2018-02-25 DIAGNOSIS — C61 Malignant neoplasm of prostate: Secondary | ICD-10-CM | POA: Diagnosis not present

## 2018-02-28 ENCOUNTER — Ambulatory Visit
Admission: RE | Admit: 2018-02-28 | Discharge: 2018-02-28 | Disposition: A | Discharge status: Home / Self Care | Payer: Medicare Other | Source: Ambulatory Visit | Attending: Radiation Oncology | Admitting: Radiation Oncology

## 2018-02-28 DIAGNOSIS — Z510 Encounter for antineoplastic radiation therapy: Secondary | ICD-10-CM | POA: Diagnosis not present

## 2018-02-28 DIAGNOSIS — C61 Malignant neoplasm of prostate: Secondary | ICD-10-CM | POA: Diagnosis not present

## 2018-03-01 ENCOUNTER — Ambulatory Visit
Admission: RE | Admit: 2018-03-01 | Discharge: 2018-03-01 | Disposition: A | Discharge status: Home / Self Care | Payer: Medicare Other | Source: Ambulatory Visit | Attending: Radiation Oncology | Admitting: Radiation Oncology

## 2018-03-01 DIAGNOSIS — C61 Malignant neoplasm of prostate: Secondary | ICD-10-CM | POA: Diagnosis not present

## 2018-03-01 DIAGNOSIS — Z510 Encounter for antineoplastic radiation therapy: Secondary | ICD-10-CM | POA: Diagnosis not present

## 2018-03-02 ENCOUNTER — Ambulatory Visit: Admission: RE | Admit: 2018-03-02 | Payer: Medicare Other | Source: Ambulatory Visit

## 2018-03-02 DIAGNOSIS — Z5111 Encounter for antineoplastic chemotherapy: Secondary | ICD-10-CM | POA: Diagnosis not present

## 2018-03-02 DIAGNOSIS — C61 Malignant neoplasm of prostate: Secondary | ICD-10-CM | POA: Diagnosis not present

## 2018-03-02 DIAGNOSIS — N401 Enlarged prostate with lower urinary tract symptoms: Secondary | ICD-10-CM | POA: Diagnosis not present

## 2018-03-02 DIAGNOSIS — Z510 Encounter for antineoplastic radiation therapy: Secondary | ICD-10-CM | POA: Diagnosis not present

## 2018-03-02 DIAGNOSIS — R351 Nocturia: Secondary | ICD-10-CM | POA: Diagnosis not present

## 2018-03-03 ENCOUNTER — Ambulatory Visit
Admission: RE | Admit: 2018-03-03 | Discharge: 2018-03-03 | Disposition: A | Discharge status: Home / Self Care | Payer: Medicare Other | Source: Ambulatory Visit | Attending: Radiation Oncology | Admitting: Radiation Oncology

## 2018-03-03 DIAGNOSIS — Z510 Encounter for antineoplastic radiation therapy: Secondary | ICD-10-CM | POA: Diagnosis not present

## 2018-03-03 DIAGNOSIS — C61 Malignant neoplasm of prostate: Secondary | ICD-10-CM | POA: Diagnosis not present

## 2018-03-04 ENCOUNTER — Ambulatory Visit
Admission: RE | Admit: 2018-03-04 | Discharge: 2018-03-04 | Disposition: A | Discharge status: Home / Self Care | Payer: Medicare Other | Source: Ambulatory Visit | Attending: Radiation Oncology | Admitting: Radiation Oncology

## 2018-03-04 DIAGNOSIS — Z510 Encounter for antineoplastic radiation therapy: Secondary | ICD-10-CM | POA: Diagnosis not present

## 2018-03-04 DIAGNOSIS — C61 Malignant neoplasm of prostate: Secondary | ICD-10-CM | POA: Diagnosis not present

## 2018-03-07 ENCOUNTER — Ambulatory Visit (INDEPENDENT_AMBULATORY_CARE_PROVIDER_SITE_OTHER): Payer: Medicare Other

## 2018-03-07 ENCOUNTER — Ambulatory Visit
Admission: RE | Admit: 2018-03-07 | Discharge: 2018-03-07 | Disposition: A | Discharge status: Home / Self Care | Payer: Medicare Other | Source: Ambulatory Visit | Attending: Radiation Oncology | Admitting: Radiation Oncology

## 2018-03-07 DIAGNOSIS — C61 Malignant neoplasm of prostate: Secondary | ICD-10-CM | POA: Diagnosis not present

## 2018-03-07 DIAGNOSIS — Z510 Encounter for antineoplastic radiation therapy: Secondary | ICD-10-CM | POA: Diagnosis not present

## 2018-03-07 DIAGNOSIS — Z23 Encounter for immunization: Secondary | ICD-10-CM | POA: Diagnosis not present

## 2018-03-08 ENCOUNTER — Ambulatory Visit
Admission: RE | Admit: 2018-03-08 | Discharge: 2018-03-08 | Disposition: A | Discharge status: Home / Self Care | Payer: Medicare Other | Source: Ambulatory Visit | Attending: Radiation Oncology | Admitting: Radiation Oncology

## 2018-03-08 DIAGNOSIS — C61 Malignant neoplasm of prostate: Secondary | ICD-10-CM | POA: Diagnosis not present

## 2018-03-08 DIAGNOSIS — Z510 Encounter for antineoplastic radiation therapy: Secondary | ICD-10-CM | POA: Diagnosis not present

## 2018-03-14 ENCOUNTER — Encounter: Payer: Self-pay | Admitting: Radiation Oncology

## 2018-03-14 NOTE — Progress Notes (Signed)
  Radiation Oncology         [phone]) [phone] ________________________________  Name: Manuel Landry MRN: [id number]  Date: 03/14/2018  DOB: 07/07/1945  End of Treatment Note  Diagnosis:   72 y.o. gentleman with stage T2a-b adenocarcinoma of the prostate with a Gleason's score of 4+5 and a PSA of 3.22 (6.4 when adjusted for Finasteride)     Indication for treatment:  Curative, Definitive Radiotherapy       Radiation treatment dates:   01/11/18 - 03/08/18  Site/dose:   The prostate was treated to 45 Gy in 25 fractions of 1.8 Gy with a boost of 30 Gy in 15 fractions of 2 Gy, for a total dose of 75 Gy delivered in 40 fractions.  Beams/energy:   The patient was treated with IMRT using volumetric arc therapy delivering 6 MV X-rays to clockwise and counterclockwise circumferential arcs with a 90 degree collimator offset to avoid dose scalloping.  Image guidance was performed with daily cone beam CT prior to each fraction to align to gold markers in the prostate and assure proper bladder and rectal fill volumes.  Immobilization was achieved with BodyFix custom mold.  Narrative: The patient tolerated radiation treatment relatively well.  He experienced some minor urinary irritation and modest fatigue.  He denied pain, post-void leakage, incontinence, or hematuria throughout treatment. He reported dysuria with mixed relief from AZO throughout treatment as well as increasing fatigue, nocturia (up from x3 to x4-5), and soft stools as treatments went on.  He also reported rare instances of constipation, blood in his stool, and urinary urgency during treatment.  Plan: The patient has completed radiation treatment. He will return to radiation oncology clinic for routine followup in one month. I advised him to call or return sooner if he has any questions or concerns related to his recovery or treatment. ________________________________  Sheral Apley. Tammi Klippel, M.D.  This document serves as a record of services  personally performed by Tyler Pita, MD. It was created on his behalf by Wilburn Mylar, a trained medical scribe. The creation of this record is based on the scribe's personal observations and the provider's statements to them. This document has been checked and approved by the attending provider.

## 2018-03-31 ENCOUNTER — Encounter: Payer: Self-pay | Admitting: Family Medicine

## 2018-04-08 ENCOUNTER — Encounter: Payer: Self-pay | Admitting: Urology

## 2018-04-08 ENCOUNTER — Ambulatory Visit
Admission: RE | Admit: 2018-04-08 | Discharge: 2018-04-08 | Disposition: A | Discharge status: Home / Self Care | Payer: Medicare Other | Source: Ambulatory Visit | Attending: Urology | Admitting: Urology

## 2018-04-08 ENCOUNTER — Other Ambulatory Visit: Payer: Self-pay

## 2018-04-08 VITALS — BP 134/83 | HR 87 | Temp 98.0°F | Resp 20 | Ht 71.0 in | Wt 178.4 lb

## 2018-04-08 DIAGNOSIS — C61 Malignant neoplasm of prostate: Secondary | ICD-10-CM | POA: Insufficient documentation

## 2018-04-08 DIAGNOSIS — Z923 Personal history of irradiation: Secondary | ICD-10-CM | POA: Insufficient documentation

## 2018-04-08 DIAGNOSIS — Z79899 Other long term (current) drug therapy: Secondary | ICD-10-CM | POA: Diagnosis not present

## 2018-04-08 NOTE — Progress Notes (Signed)
Radiation Oncology         [phone] ________________________________  Name: Manuel Landry MRN: [id number]  Date: 04/08/2018  DOB: 03-Jan-1946  Post Treatment Note  CC: [name], DO  Raynelle Bring, MD  Diagnosis:   72 y.o. gentleman with stage T2a-b adenocarcinoma of the prostate with a Gleason's score of 4+5 and a PSA of 3.22 (6.4 when adjusted for Finasteride)   Interval Since Last Radiation:  4 weeks  01/11/18 - 03/08/18:  The prostate was treated to 45 Gy in 25 fractions of 1.8 Gy with a boost of 30 Gy in 15 fractions of 2 Gy, for a total dose of 75 Gy delivered in 40 fractions.  Narrative:  The patient returns today for routine follow-up.  He tolerated radiation treatment relatively well.  He experienced some minor urinary irritation and modest fatigue.  He denied pain, post-void leakage, incontinence, or hematuria throughout treatment. He reported dysuria with mixed relief from AZO throughout treatment as well as increasing fatigue, nocturia (up from x3 to x4-5), and soft stools as treatments went on.  He also reported rare instances of constipation, blood in his stool, and urinary urgency during treatment. He remained on ADT with Lupron throughout treatment.                        On review of systems, the patient states that he is doing very well overall.  He specifically denies dysuria, gross hematuria, urgency, straining to void, intermittency or incomplete bladder emptying.  He continues with mild residual increased daytime frequency and nocturia 2-3 times per night but feels that the symptoms are gradually improving.  His current IPSS score is 6 indicating mild urinary symptoms only.  He remains on finasteride and Flomax daily.  He reports a healthy appetite and is maintaining his weight.  He denies abdominal pain, nausea, vomiting, diarrhea or constipation.  He is continued on ADT with Lupron and feels that he is tolerating this well overall with only mild fatigue  and occasional hot flashes.  Overall, he is quite pleased with his progress to date.  He has a scheduled follow-up visit with Dr. Diona Fanti on 07/06/2018.  ALLERGIES:  has No Known Allergies.  Meds: Current Outpatient Medications  Medication Sig Dispense Refill  . Apoaequorin (PREVAGEN) 10 MG CAPS Take 1 capsule by mouth daily.    Marland Kitchen azelastine (ASTELIN) 137 MCG/SPRAY nasal spray Place 1 spray into the nose as needed. Pt states his spray is 0.1% and he only uses nasal spray if needed.     . finasteride (PROSCAR) 5 MG tablet Take 5 mg by mouth every evening.     . Glucosamine-Chondroitin (MOVE FREE PO) Take 1 tablet by mouth daily.    Marland Kitchen ibuprofen (ADVIL,MOTRIN) 800 MG tablet Take 800 mg by mouth every 8 (eight) hours as needed. For pain    . magnesium oxide (MAG-OX) 400 MG tablet Take 400 mg by mouth daily.     . Multiple Vitamins-Minerals (OCUVITE ADULT 50+) CAPS Take 1 capsule by mouth daily.    . Pseudoephedrine HCl (SUDAFED PO) Take by mouth as needed.    . tadalafil (CIALIS) 5 MG tablet Take 5 mg by mouth every evening.     . tamsulosin (FLOMAX) 0.4 MG CAPS capsule Take 0.4 mg by mouth every evening.     . vitamin B-12 (CYANOCOBALAMIN) 1000 MCG tablet Take 1,000 mcg by mouth daily.    . cephALEXin (KEFLEX) 500 MG capsule Take  1 capsule (500 mg total) by mouth 2 (two) times daily. 6 capsule 0  . Dextromethorphan-guaiFENesin ([place] DM PO) Take by mouth as needed.    . diclofenac sodium (VOLTAREN) 1 % GEL Apply 1 g topically 2 (two) times daily as needed. To painful areas on shoulders    . docusate sodium (COLACE) 100 MG capsule Take 100 mg by mouth every evening.    . Lactobacillus (DIGESTIVE HEALTH PROBIOTIC PO) Take 1 capsule by mouth daily.    Marland Kitchen oxybutynin (DITROPAN) 5 MG tablet Take 1 tablet (5 mg total) by mouth every 8 (eight) hours as needed for up to 15 doses for bladder spasms. (Patient not taking: Reported on 04/08/2018) 15 tablet 1  . phenazopyridine (PYRIDIUM) 200 MG tablet Take  1 tablet (200 mg total) by mouth 3 (three) times daily as needed for pain. 35 tablet 2   No current facility-administered medications for this encounter.     Physical Findings:  height is 5\' 11"  (1.803 m) and weight is 178 lb 6.4 oz (80.9 kg). His oral temperature is 98 F (36.7 C). His blood pressure is 134/83 and his pulse is 87. His respiration is 20 and oxygen saturation is 98%.  Pain Assessment Pain Score: 0-No pain/10 In general this is a well appearing Caucasian male in no acute distress.  He's alert and oriented x4 and appropriate throughout the examination. Cardiopulmonary assessment is negative for acute distress and he exhibits normal effort.   Lab Findings: Lab Results  Component Value Date   WBC 5.3 02/04/2017   HGB 14.8 02/04/2017   HCT 44.2 02/04/2017   MCV 91.3 02/04/2017   PLT 238.0 02/04/2017     Radiographic Findings: No results found.  Impression/Plan: 1. 72 y.o. gentleman with stage T2a-b adenocarcinoma of the prostate with a Gleason's score of 4+5 and a PSA of 3.22 (6.4 when adjusted for Finasteride).  He will continue to follow up with urology for ongoing PSA determinations and has an appointment scheduled with Dr. Diona Fanti on 07/06/2018. He plans to complete a full 2 year course of ADT which he is tolerating well.  He had a 76-month Lupron injection in October 2019 and will be due for his next injection at the time of his follow-up visit with Dr. Diona Fanti in February 2020.  He understands what to expect with regards to PSA monitoring going forward. I will look forward to following his response to treatment via correspondence with urology, and would be happy to continue to participate in his care if clinically indicated. I talked to the patient about what to expect in the future, including his risk for erectile dysfunction and rectal bleeding. I encouraged him to call or return to the office if he has any questions regarding his previous radiation or possible  radiation side effects. He was comfortable with this plan and will follow up as needed.    Nicholos Johns, PA-C

## 2018-04-11 ENCOUNTER — Encounter: Payer: Self-pay | Admitting: Family Medicine

## 2018-04-11 ENCOUNTER — Ambulatory Visit (INDEPENDENT_AMBULATORY_CARE_PROVIDER_SITE_OTHER): Payer: Medicare Other | Admitting: Family Medicine

## 2018-04-11 VITALS — BP 120/80 | HR 54 | Temp 98.2°F | Ht 71.0 in | Wt 178.2 lb

## 2018-04-11 DIAGNOSIS — H938X2 Other specified disorders of left ear: Secondary | ICD-10-CM | POA: Diagnosis not present

## 2018-04-11 DIAGNOSIS — F419 Anxiety disorder, unspecified: Secondary | ICD-10-CM

## 2018-04-11 MED ORDER — CLONAZEPAM 1 MG PO TABS: 0.5000 mg | ORAL_TABLET | Freq: Two times a day (BID) | ORAL | 0 refills | Status: DC | PRN

## 2018-04-11 NOTE — Progress Notes (Signed)
Chief Complaint  Patient presents with  . Anxiety    ear problem    Subjective: Patient is a 72 y.o. male here for anxiety.  Patient has a history of prostate cancer.  He just received his second Lupron shot in October.  After the first shot, he had no issues.  He gets them every 4 months.  Since the most recent shot, he has been much more irritable and moody.  He is having high levels of anxiety around once weekly.  He has been on clonazepam in the past that has helped.  He contacted his urologist about this issue and was told to follow-up here.  L ear feels different for 2 weeks. No pain, drainage, fevers, URI s/s's, balance issues, bleeding form ear.  He has a hard time describing sensation.  He does have a nasal spray, but has not been consistently using it.     ROS: Psych: +anxiety Ears: As noted in HPI  Past Medical History:  Diagnosis Date  . Anxiety   . Arthritis   . Blind loop syndrome   . Bulging discs    LOWER BACK  . Diverticulosis   . Family history of malignant neoplasm of gastrointestinal tract   . History of adenomatous polyp of colon   . History of Barrett's esophagus   . History of chronic gastritis   . Hyperlipidemia   . IBS (irritable bowel syndrome)   . Malignant neoplasm of prostate [facility]) urologist-  dr dahlstedt/  oncologist-  dr Tammi Klippel   dx 08-09-2017-- Stage T2a, Gleason 4+5, PSA 3.22 (on finateride)--- treatment ADT and external beam radiation  . PONV (postoperative nausea and vomiting)   . Seasonal allergic rhinitis   . Wears glasses     Objective: BP 120/80 (BP Location: Left Arm, Patient Position: Sitting, Cuff Size: Normal)   Pulse (!) 54   Temp 98.2 F (36.8 C) (Oral)   Ht 5\' 11"  (1.803 m)   Wt 178 lb 4 oz (80.9 kg)   SpO2 94%   BMI 24.86 kg/m  General: Awake, appears stated age HEENT: MMM, EOMi, ears neg b/l, canals patent, nares patent w/o dc Heart: RRR Lungs: No accessory muscle use Psych: Age appropriate judgment and insight,  normal affect and mood  Assessment and Plan: Anxiety - Plan: clonazePAM (KLONOPIN) 1 MG tablet  Ear fullness, left  Trial clonazepam, 0.5-1 mg daily as needed.  Based off of current frequency, we will plan on him using around 4/month. Watchful waiting on left ear, it is not bothering him significantly. I will see him in 4 months.  We will see how he is doing after the third shot.  He has around 1-1/2 years left. The patient voiced understanding and agreement to the plan.  [place], DO 04/11/18  2:38 PM

## 2018-04-11 NOTE — Patient Instructions (Addendum)
If you are using the Klonopin more than 10 times per month, come in for an appointment.  Stay active.  Could use Flonase or Rhinocort daily for 2-3 weeks to see if that helps with your ears. They look good today.  Let us know if you need anything.

## 2018-04-11 NOTE — Progress Notes (Signed)
Pre visit review using our clinic review tool, if applicable. No additional management support is needed unless otherwise documented below in the visit note. 

## 2018-05-23 ENCOUNTER — Encounter: Payer: Self-pay | Admitting: Family Medicine

## 2018-05-23 ENCOUNTER — Ambulatory Visit (INDEPENDENT_AMBULATORY_CARE_PROVIDER_SITE_OTHER): Payer: Medicare Other | Admitting: Family Medicine

## 2018-05-23 VITALS — BP 108/68 | HR 104 | Temp 98.1°F | Ht 71.0 in | Wt 174.4 lb

## 2018-05-23 DIAGNOSIS — M79642 Pain in left hand: Secondary | ICD-10-CM

## 2018-05-23 DIAGNOSIS — M25552 Pain in left hip: Secondary | ICD-10-CM

## 2018-05-23 DIAGNOSIS — M79641 Pain in right hand: Secondary | ICD-10-CM

## 2018-05-23 NOTE — Progress Notes (Signed)
Musculoskeletal Exam  Patient: Manuel Landry DOB: 1946/05/08  DOS: 05/23/2018  SUBJECTIVE:  Chief Complaint:   Chief Complaint  Patient presents with  . Hand Pain    both hands  . Hip Pain    left    GABREAL WORTON is a 73 y.o.  male for evaluation and treatment of b/l hand and L hip pain.   Onset: several years ago, getting worse; clenched fists while snow blowing 1 1/3 miles.  Location: base of thumbs, dorsum of R hand between 2nd and 3rd digit Character:  aching and sharp  Progression of issue:  has worsened Associated symptoms: swelling, decreased grip strength Treatment: to date has been ice and OTC NSAIDS.   Neurovascular symptoms: no  L ihp pain, outer, for past year. No inj or change in activity. Hurts when he lays on his L side. Sometimes with walking.    ROS: Musculoskeletal/Extremities: +thumb/hand pain Neuro: No decreased sensation  Past Medical History:  Diagnosis Date  . Anxiety   . Arthritis   . Blind loop syndrome   . Bulging discs    LOWER BACK  . Diverticulosis   . Family history of malignant neoplasm of gastrointestinal tract   . History of adenomatous polyp of colon   . History of Barrett's esophagus   . History of chronic gastritis   . Hyperlipidemia   . IBS (irritable bowel syndrome)   . Malignant neoplasm of prostate [facility]) urologist-  dr dahlstedt/  oncologist-  dr Tammi Klippel   dx 08-09-2017-- Stage T2a, Gleason 4+5, PSA 3.22 (on finateride)--- treatment ADT and external beam radiation  . PONV (postoperative nausea and vomiting)   . Seasonal allergic rhinitis   . Wears glasses     Objective: VITAL SIGNS: BP 108/68 (BP Location: Left Arm, Patient Position: Sitting, Cuff Size: Large)   Pulse (!) 104   Temp 98.1 F (36.7 C) (Oral)   Ht 5\' 11"  (1.803 m)   Wt 174 lb 6 oz (79.1 kg)   SpO2 95%   BMI 24.32 kg/m  Constitutional: Well formed, well developed. No acute distress. Cardiovascular: Brisk cap refill Thorax & Lungs: No  accessory muscle use Musculoskeletal: L hip.   Normal active range of motion: yes.   Normal passive range of motion: yes Tenderness to palpation: no- does point to greater troch Deformity: no Ecchymosis: no Tests positive: none Tests negative: [place], log roll, FADDIR, FABER TTP at 1st C-MC b/l, neg Finkelstein's b/l, no edema at bases of thumbs; some mild edema b/t 2nd and 3rd on R, no TTP Neurologic: Normal sensory function. No focal deficits noted.  Psychiatric: Normal mood. Age appropriate judgment and insight. Alert & oriented x 3.    Assessment:  Pain in both hands - Plan: Ambulatory referral to Sports Medicine  Left hip pain  Plan: #1- question OA in bases of thumbs, lumbrical strain of R hand. Tylenol, refer to sports med for possible [place] or injections.  #2- Greater troch bursitis, mild today so will rec ice and stretches/exercises for IT band.  F/u prn. The patient voiced understanding and agreement to the plan.   [place], DO 05/23/18  8:45 AM

## 2018-05-23 NOTE — Progress Notes (Signed)
Pre visit review using our clinic review tool, if applicable. No additional management support is needed unless otherwise documented below in the visit note. 

## 2018-05-23 NOTE — Patient Instructions (Addendum)
Ice/cold pack over area for 10-15 min twice daily.  OK to take Tylenol 1000 mg (2 extra strength tabs) or 975 mg (3 regular strength tabs) every 6 hours as needed.  If you do not hear anything about your referral in the next 1-2 weeks, call our office and ask for an update.  Let us know if you need anything.  Iliotibial Band Syndrome Rehab It is normal to feel mild stretching, pulling, tightness, or discomfort as you do these exercises, but you should stop right away if you feel sudden pain or your pain gets worse.  Stretching and range of motion exercises These exercises warm up your muscles and joints and improve the movement and flexibility of your hip and pelvis. Exercise A: Quadriceps, prone    1. Lie on your abdomen on a firm surface, such as a bed or padded floor. 2. Bend your left / right knee and hold your ankle. If you cannot reach your ankle or pant leg, loop a belt around your foot and grab the belt instead. 3. Gently pull your heel toward your buttocks. Your knee should not slide out to the side. You should feel a stretch in the front of your thigh and knee. 4. Hold this position for 30 seconds. Repeat 2 times. Complete this stretch 3 times per week. Exercise B: Iliotibial band    1. Lie on your side with your left / right leg in the top position. 2. Bend both of your knees and grab your left / right ankle. Stretch out your bottom arm to help you balance. 3. Slowly bring your top knee back so your thigh goes behind your trunk. 4. Slowly lower your top leg toward the floor until you feel a gentle stretch on the outside of your left / right hip and thigh. If you do not feel a stretch and your knee will not fall farther, place the heel of your other foot on top of your knee and pull your knee down toward the floor with your foot. 5. Hold this position for 30 seconds. Repeat 2 times. Complete this stretch 3 times per week. Strengthening exercises These exercises build strength  and endurance in your hip and pelvis. Endurance is the ability to use your muscles for a long time, even after they get tired. Exercise C: Straight leg raises (hip abductors)     1. Lie on your side with your left / right leg in the top position. Lie so your head, shoulder, knee, and hip line up. You may bend your bottom knee to help you balance. 2. Roll your hips slightly forward so your hips are stacked directly over each other and your left / right knee is facing forward. 3. Tense the muscles in your outer thigh and lift your top leg 4-6 inches (10-15 cm). 4. Hold this position for 3 seconds. Repeat for a total of 10 reps. 5. Slowly return to the starting position. Let your muscles relax completely before doing another repetition. Repeat 2 times. Complete this exercise 3 times per week. Exercise D: Straight leg raises (hip extensors) 1. Lie on your abdomen on your bed or a firm surface. You can put a pillow under your hips if that is more comfortable. 2. Bend your left / right knee so your foot is straight up in the air. 3. Squeeze your buttock muscles and lift your left / right thigh off the bed. Do not let your back arch. 4. Tense this muscle as hard as you  can without increasing any knee pain. 5. Hold this position for 2 seconds. Repeat for a total of 10 reps 6. Slowly lower your leg to the starting position and allow it to relax completely. Repeat 2 times. Complete this exercise 3 times per week. Exercise E: Hip hike 1. Stand sideways on a bottom step. Stand on your left / right leg with your other foot unsupported next to the step. You can hold onto the railing or wall if needed for balance. 2. Keep your knees straight and your torso square. Then, lift your left / right hip up toward the ceiling. 3. Slowly let your left / right hip lower toward the floor, past the starting position. Your foot should get closer to the floor. Do not lean or bend your knees. Repeat 2 times. Complete this  exercise 3 times per week.  Document Released: 05/04/2005 Document Revised: 01/07/2016 Document Reviewed: 04/05/2015 Elsevier Interactive Patient Education  [employer].

## 2018-05-27 ENCOUNTER — Ambulatory Visit (INDEPENDENT_AMBULATORY_CARE_PROVIDER_SITE_OTHER): Payer: Medicare Other | Admitting: Family Medicine

## 2018-05-27 ENCOUNTER — Encounter: Payer: Self-pay | Admitting: Family Medicine

## 2018-05-27 VITALS — BP 120/84 | HR 91 | Temp 98.3°F | Ht 71.0 in | Wt 179.2 lb

## 2018-05-27 DIAGNOSIS — S0990XA Unspecified injury of head, initial encounter: Secondary | ICD-10-CM

## 2018-05-27 DIAGNOSIS — W19XXXA Unspecified fall, initial encounter: Secondary | ICD-10-CM

## 2018-05-27 NOTE — Progress Notes (Signed)
Pre visit review using our clinic review tool, if applicable. No additional management support is needed unless otherwise documented below in the visit note. 

## 2018-05-27 NOTE — Patient Instructions (Addendum)
Be careful not to fall.  Listen to your body if you feel more tired lately.  OK to use ibuprofen.  Let us know if you need anything.

## 2018-05-27 NOTE — Progress Notes (Signed)
Chief Complaint  Patient presents with  . Fall    Subjective: Patient is a 73 y.o. male here for a fall.  Fell 2 days ago and top of head on door jam. No LOC. Has a bump on top of his head. Has a migrating headache. He iced and used NSAID. Neck is still. Having some resolving balance issue and a visual changes since the fall.    ROS: Neuro: As noted in HPI  Past Medical History:  Diagnosis Date  . Anxiety   . Arthritis   . Blind loop syndrome   . Bulging discs    LOWER BACK  . Diverticulosis   . Family history of malignant neoplasm of gastrointestinal tract   . History of adenomatous polyp of colon   . History of Barrett's esophagus   . History of chronic gastritis   . Hyperlipidemia   . IBS (irritable bowel syndrome)   . Malignant neoplasm of prostate [facility]) urologist-  dr dahlstedt/  oncologist-  dr Tammi Klippel   dx 08-09-2017-- Stage T2a, Gleason 4+5, PSA 3.22 (on finateride)--- treatment ADT and external beam radiation  . PONV (postoperative nausea and vomiting)   . Seasonal allergic rhinitis   . Wears glasses    Objective: BP 120/84 (BP Location: Left Arm, Patient Position: Sitting, Cuff Size: Normal)   Pulse 91   Temp 98.3 F (36.8 C) (Oral)   Ht 5\' 11"  (1.803 m)   Wt 179 lb 4 oz (81.3 kg)   SpO2 95%   BMI 25.00 kg/m  General: Awake, appears stated age HEENT: MMM, EOMi, PERRLA Neuro: DTR's equal and symmetric throughout, no clonus, no cerebellar signs Lungs: CTAB, no rales, wheezes or rhonchi. No accessory muscle use Psych: Age appropriate judgment and insight, normal affect and mood  Assessment and Plan: Fall, initial encounter  Be careful not to fall. May have mild concussion. Nothing sinister. 2 d out, not concerned for anything sinister. F/u prn.  The patient voiced understanding and agreement to the plan.  [place], DO 05/27/18  2:51 PM

## 2018-05-30 ENCOUNTER — Ambulatory Visit (INDEPENDENT_AMBULATORY_CARE_PROVIDER_SITE_OTHER): Payer: Medicare Other | Admitting: Family Medicine

## 2018-05-30 ENCOUNTER — Encounter: Payer: Self-pay | Admitting: Family Medicine

## 2018-05-30 VITALS — BP 121/76 | HR 106 | Ht 71.0 in | Wt 175.0 lb

## 2018-05-30 DIAGNOSIS — M180 Bilateral primary osteoarthritis of first carpometacarpal joints: Secondary | ICD-10-CM | POA: Diagnosis not present

## 2018-05-30 NOTE — Patient Instructions (Signed)
Your pain is due to arthritis at you [facility] joints of your thumbs and MCP joints of your fingers. These are the different medications you can take for this: Voltaren gel up to 4 times a day topically. Tylenol 500mg  1-2 tabs three times a day for pain. Capsaicin, aspercreme, or biofreeze topically up to four times a day may also help with pain. Some supplements that may help for arthritis: Boswellia extract, curcumin, pycnogenol Cortisone injections are an option. It's important that you continue to stay active. Thumb spica braces may help if pain is severe - I don't think you need these right now. Heat or ice 15 minutes at a time 3-4 times a day as needed to help with pain. Follow up with me in 1 month if you're doing well, call me sooner if you're struggling and we can do injection(s).

## 2018-05-30 NOTE — Progress Notes (Signed)
PCP: [name], DO  Subjective:   HPI: Patient is a 73 y.o. male here for bilateral hand pain.  Patient reports bilateral first [place] pain.  He notes 10/10 pain which is worse with prolonged grip or using his hands.  His pain is been ongoing for 12 years but has worsened over the last 5.  He denies any specific injury.  He does note morning stiffness in both hands.  He has used ibuprofen which is not been helpful.  He has tried some over-the-counter topical treatments which have also not been helpful.  He denies any significant erythema or swelling.  No numbness or tingling associated.  No associated skin changes.  He also has 5/10 pain in the second and third MCPs of the right hand.  He relates this to prolonged use of a snowblower nearly a decade ago.  He does note some occasional swelling of the joints but no erythema or bruising.  No numbness or tingling.  No skin changes. No h/o gout. No fam hx of RA  Past Medical History:  Diagnosis Date  . Anxiety   . Arthritis   . Blind loop syndrome   . Bulging discs    LOWER BACK  . Diverticulosis   . Family history of malignant neoplasm of gastrointestinal tract   . History of adenomatous polyp of colon   . History of Barrett's esophagus   . History of chronic gastritis   . Hyperlipidemia   . IBS (irritable bowel syndrome)   . Malignant neoplasm of prostate [facility]) urologist-  dr dahlstedt/  oncologist-  dr Tammi Klippel   dx 08-09-2017-- Stage T2a, Gleason 4+5, PSA 3.22 (on finateride)--- treatment ADT and external beam radiation  . PONV (postoperative nausea and vomiting)   . Seasonal allergic rhinitis   . Wears glasses     Current Outpatient Medications on File Prior to Visit  Medication Sig Dispense Refill  . Apoaequorin (PREVAGEN) 10 MG CAPS Take 1 capsule by mouth daily.    Marland Kitchen azelastine (ASTELIN) 137 MCG/SPRAY nasal spray Place 1 spray into the nose as needed. Pt states his spray is 0.1% and he only uses nasal spray if needed.      . clonazePAM (KLONOPIN) 1 MG tablet Take 0.5-1 tablets (0.5-1 mg total) by mouth 2 (two) times daily as needed for anxiety. 30 tablet 0  . Dextromethorphan-guaiFENesin ([place] DM PO) Take by mouth as needed.    . diclofenac sodium (VOLTAREN) 1 % GEL Apply 1 g topically 2 (two) times daily as needed. To painful areas on shoulders    . docusate sodium (COLACE) 100 MG capsule Take 100 mg by mouth every evening.    . finasteride (PROSCAR) 5 MG tablet Take 5 mg by mouth every evening.     . Glucosamine-Chondroitin (MOVE FREE PO) Take 1 tablet by mouth daily.    Marland Kitchen ibuprofen (ADVIL,MOTRIN) 800 MG tablet Take 800 mg by mouth every 8 (eight) hours as needed. For pain    . Lactobacillus (DIGESTIVE HEALTH PROBIOTIC PO) Take 1 capsule by mouth daily.    . magnesium oxide (MAG-OX) 400 MG tablet Take 400 mg by mouth daily.     . Multiple Vitamins-Minerals (OCUVITE ADULT 50+) CAPS Take 1 capsule by mouth daily.    Marland Kitchen oxybutynin (DITROPAN) 5 MG tablet Take 1 tablet (5 mg total) by mouth every 8 (eight) hours as needed for up to 15 doses for bladder spasms. 15 tablet 1  . phenazopyridine (PYRIDIUM) 200 MG tablet Take 1 tablet (  200 mg total) by mouth 3 (three) times daily as needed for pain. 35 tablet 2  . Pseudoephedrine HCl (SUDAFED PO) Take by mouth as needed.    . tadalafil (CIALIS) 5 MG tablet Take 5 mg by mouth every evening.     . tamsulosin (FLOMAX) 0.4 MG CAPS capsule Take 0.4 mg by mouth every evening.     . vitamin B-12 (CYANOCOBALAMIN) 1000 MCG tablet Take 1,000 mcg by mouth daily.     No current facility-administered medications on file prior to visit.     Past Surgical History:  Procedure Laterality Date  . COLONOSCOPY  last one 10-21-2016  dr pyrtle  . CYSTOSCOPY WITH INSERTION OF UROLIFT N/A 12/15/2017   Procedure: CYSTOSCOPY WITH INSERTION OF UROLIFT, BIOPSY  PROSTATIC URETHAL;  Surgeon: Franchot Gallo, MD;  Location: [facility];  Service: Urology;  Laterality: N/A;  .  ESOPHAGOGASTRODUODENOSCOPY (EGD) WITH PROPOFOL  last one 09-26-2013   dr pyrtle  . INGUINAL HERNIA REPAIR Bilateral right 10-25-1997:  left 09-28-2008 (dr m. Hassell Done @ [facility])  . LAPAROSCOPIC CHOLECYSTECTOMY  2012  . MANDIBLE SURGERY Bilateral 1990s   removal calcified bone growths  . ROTATOR CUFF REPAIR Right 03-26-2009   dr Tonita Cong  [facility]  . SHOULDER OPEN ROTATOR CUFF REPAIR  03/17/2012   Procedure: ROTATOR CUFF REPAIR SHOULDER OPEN;  Surgeon: Johnn Hai, MD;  Location: [facility];  Service: Orthopedics;  Laterality: Left;  LEFT MINI OPEN ROTATOR CUFF REPAIR   . SPACE OAR INSTILLATION N/A 12/15/2017   Procedure: SPACE OAR INSTILLATION;  Surgeon: Franchot Gallo, MD;  Location: [facility];  Service: Urology;  Laterality: N/A;  . TONSILLECTOMY  child    No Known Allergies  Social History   Socioeconomic History  . Marital status: Married    Spouse name: Caren Griffins  . Number of children: 0  . Years of education: Not on file  . Highest education level: Not on file  Occupational History  . Occupation: Retired    Fish farm manager: RETIRED  Social Needs  . Financial resource strain: Not on file  . Food insecurity:    Worry: Not on file    Inability: Not on file  . Transportation needs:    Medical: Not on file    Non-medical: Not on file  Tobacco Use  . Smoking status: Never Smoker  . Smokeless tobacco: Never Used  Substance and Sexual Activity  . Alcohol use: Yes    Comment: occasional  . Drug use: No  . Sexual activity: Yes    Comment: vasectomy 2011  Lifestyle  . Physical activity:    Days per week: Not on file    Minutes per session: Not on file  . Stress: Not on file  Relationships  . Social connections:    Talks on phone: Not on file    Gets together: Not on file    Attends religious service: Not on file    Active member of club or organization: Not on file    Attends meetings of clubs or organizations: Not on file    Relationship status: Not on file  .  Intimate partner violence:    Fear of current or ex partner: Not on file    Emotionally abused: Not on file    Physically abused: Not on file    Forced sexual activity: Not on file  Other Topics Concern  . Not on file  Social History Narrative  . Not on file    Family History  Problem Relation Age of Onset  . Colon cancer Mother 56  . Diabetes Maternal Grandmother   . COPD Sister   . Heart failure Father 37  . Esophageal cancer Neg Hx   . Rectal cancer Neg Hx   . Stomach cancer Neg Hx     There were no vitals taken for this visit.  Review of Systems: See HPI above.     Objective:  Physical Exam:  Gen: awake, alert, NAD, comfortable in exam room Pulm: breathing unlabored  Right Hand: Inspection: Mild bony hypertrophy at 1st cmc and the 2nd and 3rd MCPs Palpation: Mild tenderness at the first [place] ROM: Full ROM of the digits and wrist Strength: 5/5 strength in the forearm, wrist and interosseus muscles Neurovascular: NV intact Special tests: Negative finkelstein's, negative tinel's at the carpal tunnel  Left Hand: Inspection: Mild bony hypertrophy at 1st cmc Palpation: Mild tenderness at the first [place] ROM: Full ROM of the digits and wrist Strength: 5/5 strength in the forearm, wrist and interosseus muscles Neurovascular: NV intact Special tests: Negative finkelstein's, negative tinel's at the carpal tunnel    Assessment & Plan:  1.  Bilateral thumb and hand pain- patient has bilateral first [place] arthritis as well as arthritis in the second and third MCPs. - Patient has Voltaren gel at home.  He will trial this up to 4 times daily over the next week or so. - Continue ibuprofen or Tylenol as needed - He will follow-up for steroid injections if Voltaren gel is not helpful. - Follow-up in about 1 month or sooner for steroid injections

## 2018-06-20 ENCOUNTER — Telehealth: Payer: Self-pay | Admitting: Medical Oncology

## 2018-06-20 NOTE — Telephone Encounter (Signed)
Manuel Landry called asking about counseling programs here at [facility]. He completed radiation for prostate cancer Oct 2019. He is now experiencing anxiety and would like to talk with someone. He is interested in meeting with a counselor. I informed him that I will reach out to our support team  and have them contact  him. I also encouraged him to attend our prostate support group and he states he will give it some thought. I sent Lorrin Jackson a message requesting her assistance.

## 2018-06-23 ENCOUNTER — Encounter: Payer: Self-pay | Admitting: Family Medicine

## 2018-06-27 ENCOUNTER — Encounter: Payer: Self-pay | Admitting: Family Medicine

## 2018-06-27 ENCOUNTER — Ambulatory Visit (INDEPENDENT_AMBULATORY_CARE_PROVIDER_SITE_OTHER): Payer: Medicare Other | Admitting: Family Medicine

## 2018-06-27 VITALS — BP 109/72 | HR 93 | Ht 71.0 in | Wt 174.0 lb

## 2018-06-27 VITALS — BP 120/72 | HR 84 | Temp 98.2°F | Ht 71.0 in | Wt 174.0 lb

## 2018-06-27 DIAGNOSIS — M180 Bilateral primary osteoarthritis of first carpometacarpal joints: Secondary | ICD-10-CM

## 2018-06-27 DIAGNOSIS — M65312 Trigger thumb, left thumb: Secondary | ICD-10-CM

## 2018-06-27 DIAGNOSIS — E785 Hyperlipidemia, unspecified: Secondary | ICD-10-CM

## 2018-06-27 DIAGNOSIS — Z1159 Encounter for screening for other viral diseases: Secondary | ICD-10-CM

## 2018-06-27 DIAGNOSIS — M6530 Trigger finger, unspecified finger: Secondary | ICD-10-CM

## 2018-06-27 LAB — COMPREHENSIVE METABOLIC PANEL
ALK PHOS: 45 U/L (ref 39–117)
ALT: 14 U/L (ref 0–53)
AST: 18 U/L (ref 0–37)
Albumin: 4.2 g/dL (ref 3.5–5.2)
BILIRUBIN TOTAL: 0.6 mg/dL (ref 0.2–1.2)
BUN: 14 mg/dL (ref 6–23)
CO2: 28 meq/L (ref 19–32)
Calcium: 9 mg/dL (ref 8.4–10.5)
Chloride: 102 mEq/L (ref 96–112)
Creatinine, Ser: 0.83 mg/dL (ref 0.40–1.50)
GFR: 90.86 mL/min (ref >60.00)
GLUCOSE: 80 mg/dL (ref 70–99)
Potassium: 4.4 mEq/L (ref 3.5–5.1)
SODIUM: 139 meq/L (ref 135–145)
TOTAL PROTEIN: 6.5 g/dL (ref 6.0–8.3)

## 2018-06-27 LAB — LIPID PANEL
CHOL/HDL RATIO: 4
Cholesterol: 219 mg/dL — ABNORMAL HIGH (ref 0–200)
HDL: 60.7 mg/dL (ref >39.00)
LDL Cholesterol: 138 mg/dL — ABNORMAL HIGH (ref 0–99)
NonHDL: 158.62
Triglycerides: 101 mg/dL (ref 0.0–149.0)
VLDL: 20.2 mg/dL (ref 0.0–40.0)

## 2018-06-27 MED ORDER — METHYLPREDNISOLONE ACETATE 40 MG/ML IJ SUSP
20.0000 mg | Freq: Once | INTRAMUSCULAR | Status: AC
  Administered 2018-06-27: 20 mg via INTRA_ARTICULAR

## 2018-06-27 MED ORDER — DICLOFENAC SODIUM 1 % TD GEL: 2.0000 g | Freq: Two times a day (BID) | TRANSDERMAL | 1 refills | Status: DC | PRN

## 2018-06-27 NOTE — Progress Notes (Signed)
Chief Complaint  Patient presents with  . Follow-up    labs    Subjective: Patient is a 73 y.o. male here for hyperlipidemia.  Needs labs. Diet is pretty good overall. Has been walking the dog. Does some wt resistance also. Not on a statin as his high HDL has made it not necessary.   Has prostate cancer, diazepam helps anxiety related to this. Klonopin works for much shorter duration.   ROS: Heart: Denies chest pain  Lungs: Denies SOB   Past Medical History:  Diagnosis Date  . Anxiety   . Arthritis   . Blind loop syndrome   . Bulging discs    LOWER BACK  . Diverticulosis   . Family history of malignant neoplasm of gastrointestinal tract   . History of adenomatous polyp of colon   . History of Barrett's esophagus   . History of chronic gastritis   . Hyperlipidemia   . IBS (irritable bowel syndrome)   . Malignant neoplasm of prostate [facility]) urologist-  dr dahlstedt/  oncologist-  dr Tammi Klippel   dx 08-09-2017-- Stage T2a, Gleason 4+5, PSA 3.22 (on finateride)--- treatment ADT and external beam radiation  . PONV (postoperative nausea and vomiting)   . Seasonal allergic rhinitis   . Wears glasses     Objective: BP 120/72 (BP Location: Left Arm, Patient Position: Sitting, Cuff Size: Normal)   Pulse 84   Temp 98.2 F (36.8 C) (Oral)   Ht 5\' 11"  (1.803 m)   Wt 174 lb (78.9 kg)   SpO2 94%   BMI 24.27 kg/m  General: Awake, appears stated age HEENT: MMM, EOMi Heart: RRR, no LE edema Lungs: CTAB, no rales, wheezes or rhonchi. No accessory muscle use Psych: Age appropriate judgment and insight, normal affect and mood  Assessment and Plan: Hyperlipidemia, unspecified hyperlipidemia type - Plan: Lipid panel, Comprehensive metabolic panel  Encounter for hepatitis C screening test for low risk patient - Plan: Hepatitis C antibody  Orders as above. Counseled on diet and exercise. F/u in 6 mo.  The patient voiced understanding and agreement to the plan.  Cromwell, DO 06/27/18  8:46 AM

## 2018-06-27 NOTE — Patient Instructions (Signed)
You have a trigger thumb on the left side - you were given an injection for this. Consider band-aid trick of the joint of the thumb, extension aluminum splint if not improving as expected. Your other pain is due to arthritis at you [facility] joints of your thumbs and MCP joints of your fingers. These are the different medications you can take for this: Voltaren gel up to 4 times a day topically. Tylenol 500mg  1-2 tabs three times a day for pain. Capsaicin, aspercreme, or biofreeze topically up to four times a day may also help with pain. Some supplements that may help for arthritis: Boswellia extract, curcumin, pycnogenol Cortisone injections are an option. It's important that you continue to stay active. Thumb spica braces may help if pain is severe - I don't think you need these right now. Heat or ice 15 minutes at a time 3-4 times a day as needed to help with pain. Follow up with me in 1 month.

## 2018-06-27 NOTE — Patient Instructions (Signed)
Give us 2-3 business days to get the results of your labs back.   Keep the diet clean and stay active.  Let us know if you need anything. 

## 2018-06-27 NOTE — Progress Notes (Signed)
PCP: [name], DO  Subjective:   HPI: Patient is a 73 y.o. male here for bilateral hand pain.  1/13: Patient reports bilateral first [place] pain.  He notes 10/10 pain which is worse with prolonged grip or using his hands.  His pain is been ongoing for 12 years but has worsened over the last 5.  He denies any specific injury.  He does note morning stiffness in both hands.  He has used ibuprofen which is not been helpful.  He has tried some over-the-counter topical treatments which have also not been helpful.  He denies any significant erythema or swelling.  No numbness or tingling associated.  No associated skin changes.  He also has 5/10 pain in the second and third MCPs of the right hand.  He relates this to prolonged use of a snowblower nearly a decade ago.  He does note some occasional swelling of the joints but no erythema or bruising.  No numbness or tingling.  No skin changes. No h/o gout. No fam hx of RA  2/10: Patient reports primary problem now is left thumb is catching at 8-10/10 level pain and sharp. Right hand pain 5-6/10 primarily at 2nd-3rd MCP joints, less at base of thumb. Left hand still with pain base of thumb but not as bad as pain when thumb catches. Worse in evening. Using voltaren gel twice a day which helps some. No numbness.  Looks like distal part of thumb gets gray at times.  Past Medical History:  Diagnosis Date  . Anxiety   . Arthritis   . Blind loop syndrome   . Bulging discs    LOWER BACK  . Diverticulosis   . Family history of malignant neoplasm of gastrointestinal tract   . History of adenomatous polyp of colon   . History of Barrett's esophagus   . History of chronic gastritis   . Hyperlipidemia   . IBS (irritable bowel syndrome)   . Malignant neoplasm of prostate [facility]) urologist-  dr dahlstedt/  oncologist-  dr Tammi Klippel   dx 08-09-2017-- Stage T2a, Gleason 4+5, PSA 3.22 (on finateride)--- treatment ADT and external beam radiation  .  PONV (postoperative nausea and vomiting)   . Seasonal allergic rhinitis   . Wears glasses     Current Outpatient Medications on File Prior to Visit  Medication Sig Dispense Refill  . Apoaequorin (PREVAGEN) 10 MG CAPS Take 1 capsule by mouth daily.    Marland Kitchen azelastine (ASTELIN) 137 MCG/SPRAY nasal spray Place 1 spray into the nose as needed. Pt states his spray is 0.1% and he only uses nasal spray if needed.     Marland Kitchen Dextromethorphan-guaiFENesin ([place] DM PO) Take by mouth as needed.    . finasteride (PROSCAR) 5 MG tablet Take 5 mg by mouth every evening.     . Glucosamine-Chondroitin (MOVE FREE PO) Take 1 tablet by mouth daily.    Marland Kitchen ibuprofen (ADVIL,MOTRIN) 800 MG tablet Take 800 mg by mouth every 8 (eight) hours as needed. For pain    . magnesium oxide (MAG-OX) 400 MG tablet Take 400 mg by mouth daily.     . Multiple Vitamins-Minerals (OCUVITE ADULT 50+) CAPS Take 1 capsule by mouth daily.    . Pseudoephedrine HCl (SUDAFED PO) Take by mouth as needed.    . tadalafil (CIALIS) 5 MG tablet Take 5 mg by mouth every evening.     . tamsulosin (FLOMAX) 0.4 MG CAPS capsule Take 0.4 mg by mouth every evening.     Marland Kitchen  tretinoin (RETIN-A) 0.025 % cream APPLY TO AFFECTED AREA EVERY DAY AT BEDTIME  2  . vitamin B-12 (CYANOCOBALAMIN) 1000 MCG tablet Take 1,000 mcg by mouth daily.     No current facility-administered medications on file prior to visit.     Past Surgical History:  Procedure Laterality Date  . COLONOSCOPY  last one 10-21-2016  dr pyrtle  . CYSTOSCOPY WITH INSERTION OF UROLIFT N/A 12/15/2017   Procedure: CYSTOSCOPY WITH INSERTION OF UROLIFT, BIOPSY  PROSTATIC URETHAL;  Surgeon: Franchot Gallo, MD;  Location: [facility];  Service: Urology;  Laterality: N/A;  . ESOPHAGOGASTRODUODENOSCOPY (EGD) WITH PROPOFOL  last one 09-26-2013   dr pyrtle  . INGUINAL HERNIA REPAIR Bilateral right 10-25-1997:  left 09-28-2008 (dr m. Hassell Done @ [facility])  . LAPAROSCOPIC CHOLECYSTECTOMY  2012  .  MANDIBLE SURGERY Bilateral 1990s   removal calcified bone growths  . ROTATOR CUFF REPAIR Right 03-26-2009   dr Tonita Cong  [facility]  . SHOULDER OPEN ROTATOR CUFF REPAIR  03/17/2012   Procedure: ROTATOR CUFF REPAIR SHOULDER OPEN;  Surgeon: Johnn Hai, MD;  Location: [facility];  Service: Orthopedics;  Laterality: Left;  LEFT MINI OPEN ROTATOR CUFF REPAIR   . SPACE OAR INSTILLATION N/A 12/15/2017   Procedure: SPACE OAR INSTILLATION;  Surgeon: Franchot Gallo, MD;  Location: [facility];  Service: Urology;  Laterality: N/A;  . TONSILLECTOMY  child    No Known Allergies  Social History   Socioeconomic History  . Marital status: Married    Spouse name: Manuel Landry  . Number of children: 0  . Years of education: Not on file  . Highest education level: Not on file  Occupational History  . Occupation: Retired    Fish farm manager: RETIRED  Social Needs  . Financial resource strain: Not on file  . Food insecurity:    Worry: Not on file    Inability: Not on file  . Transportation needs:    Medical: Not on file    Non-medical: Not on file  Tobacco Use  . Smoking status: Never Smoker  . Smokeless tobacco: Never Used  Substance and Sexual Activity  . Alcohol use: Yes    Comment: occasional  . Drug use: No  . Sexual activity: Yes    Comment: vasectomy 2011  Lifestyle  . Physical activity:    Days per week: Not on file    Minutes per session: Not on file  . Stress: Not on file  Relationships  . Social connections:    Talks on phone: Not on file    Gets together: Not on file    Attends religious service: Not on file    Active member of club or organization: Not on file    Attends meetings of clubs or organizations: Not on file    Relationship status: Not on file  . Intimate partner violence:    Fear of current or ex partner: Not on file    Emotionally abused: Not on file    Physically abused: Not on file    Forced sexual activity: Not on file  Other Topics Concern  . Not on  file  Social History Narrative  . Not on file    Family History  Problem Relation Age of Onset  . Colon cancer Mother 22  . Diabetes Maternal Grandmother   . COPD Sister   . Heart failure Father 45  . Esophageal cancer Neg Hx   . Rectal cancer Neg Hx   . Stomach cancer Neg Hx  BP 109/72   Pulse 93   Ht 5\' 11"  (1.803 m)   Wt 174 lb (78.9 kg)   BMI 24.27 kg/m   Review of Systems: See HPI above.     Objective:  Physical Exam:  Gen: NAD, comfortable in exam room  Right hand/wrist: Hypertrophy of 2nd and 3rd MCP joints, also at 1st [facility] joint.  No bruising, other deformity. TTP mildly over 2nd, 3rd MCPs and 1st CMC joint. FROM digits and wrist with 5/5 strength. NVI distally.  Left hand/wrist: Catching of 1st digit with flexion of IP joint.  Small nodule at A1 pulley.  No bruising, other deformity.  Mild hypertrophy 1st CMC. TTP over nodule at 1st A1 pulley, CMC joint.  No other tenderness. FROM digits and wrist with 5/5 strength. NVI distally.   Assessment & Plan:  1.  Bilateral thumb and hand pain- primary problem today is trigger finger of left thumb.  Injection given today - discussed how to splint this as well.  Voltaren gel.  Tylenol, topical medications, supplements reviewed.  Consider injections for arthritis joints only if pain is severe.  Heat or ice.  F/u in 1 month.  After informed written consent patient timeout was performed and patient was seated in chair in exam room.  Left 1st A1 pulley identified by ultrasound, prepped with alcohol swab, then injected with 0.5:0.70mL bupivicaine: depomedrol.  Patient tolerated procedure well without immediate complications.

## 2018-06-28 NOTE — Progress Notes (Signed)
[place] Counseling Intake Session  The pt presented to session with a calm mood and matching affect. The pt was alert and oriented.  The pt expressed that he is presenting to counseling to learn more about the source of anxiety that he has begun experiencing in the last 3 weeks. The pt shared that prior to beginning to experience anxiety, he felt "melancholy." The pt identified that his experience of anxiety has been persistent over the last 3 weeks and anxiety medication has provided minimal relief. The pt denied any current or past suicidal ideation. The pt reported that he attended counseling about 15 years ago after his wife suggested it for managing anxiety. The pt shared that he did not necessarily gain anything from his previous experience in counseling, but is currently presenting to counseling to receive support in ongoing management of anxiety.  The pt and counselor will meet again on Tuesday, February 18 at 10 am.

## 2018-06-29 DIAGNOSIS — C61 Malignant neoplasm of prostate: Secondary | ICD-10-CM | POA: Diagnosis not present

## 2018-06-29 LAB — HEPATITIS C ANTIBODY
Hepatitis C Ab: NONREACTIVE
SIGNAL TO CUT-OFF: 0.01 (ref <1.00)

## 2018-07-05 NOTE — Progress Notes (Signed)
[place] Counseling Session  The pt presented to session with a calm mood and matching affect. The pt was alert and oriented throughout session, though he seemed to have difficulty remaining on topic.  The pt expressed that he is not sure what he can gain from counseling, but he is willing to try it. When asked about his experience of anxiety in the last week, the pt reported that he had identified his "triggers." The pt shared stories about situations he found frustrating, indicating that these situations brought up anxiety that was only relieved once he removed himself from the situation and/or took his prescribed dosage of diazepam (10 mg). The pt reported that he is able to "calm down" by "keeping to myself." However, the pt shared that he also has been able to talk to his wife about his concerns with anxiety. The pt expressed exhaustion with having multiple appointments in a week and having to plan several steps ahead in his mind.  The pt and counselor processed the pt's experience of anxiety. The counselor also broached differences in cultural identity with the pt. The pt expressed that since he is older than the counselor, he feels it is important to "share information" in the counseling room. The counselor affirmed the pt as the expert in his own life, and also redirected the pt to consider experiences in his own life that are relevant to his current experience of anxiety.  The pt expressed that he would like to schedule a follow-up appointment, but he is unsure of his availability at this time. The pt reported that he will call the counselor to schedule a follow-up session.  Doris Cheadle, Counseling Intern [phone]

## 2018-07-06 DIAGNOSIS — Z5111 Encounter for antineoplastic chemotherapy: Secondary | ICD-10-CM | POA: Diagnosis not present

## 2018-07-06 DIAGNOSIS — N401 Enlarged prostate with lower urinary tract symptoms: Secondary | ICD-10-CM | POA: Diagnosis not present

## 2018-07-06 DIAGNOSIS — R351 Nocturia: Secondary | ICD-10-CM | POA: Diagnosis not present

## 2018-07-06 DIAGNOSIS — C61 Malignant neoplasm of prostate: Secondary | ICD-10-CM | POA: Diagnosis not present

## 2018-07-07 ENCOUNTER — Encounter: Payer: Self-pay | Admitting: Family Medicine

## 2018-07-07 ENCOUNTER — Other Ambulatory Visit: Payer: Self-pay | Admitting: Family Medicine

## 2018-07-07 MED ORDER — DIAZEPAM 10 MG PO TABS: 10.0000 mg | ORAL_TABLET | Freq: Two times a day (BID) | ORAL | 1 refills | Status: DC | PRN

## 2018-07-27 ENCOUNTER — Encounter: Payer: Self-pay | Admitting: Family Medicine

## 2018-07-27 ENCOUNTER — Ambulatory Visit (INDEPENDENT_AMBULATORY_CARE_PROVIDER_SITE_OTHER): Payer: Medicare Other | Admitting: Family Medicine

## 2018-07-27 ENCOUNTER — Other Ambulatory Visit: Payer: Self-pay

## 2018-07-27 VITALS — BP 127/82 | HR 93 | Ht 71.0 in | Wt 176.0 lb

## 2018-07-27 DIAGNOSIS — M180 Bilateral primary osteoarthritis of first carpometacarpal joints: Secondary | ICD-10-CM

## 2018-07-27 DIAGNOSIS — M6530 Trigger finger, unspecified finger: Secondary | ICD-10-CM

## 2018-07-27 NOTE — Patient Instructions (Signed)
Continue the topical medications as you have been. Call me if you need anything otherwise follow up as needed. Talk to the dermatologist about your nails, cracking skin when you see them.

## 2018-07-29 ENCOUNTER — Encounter: Payer: Self-pay | Admitting: Family Medicine

## 2018-07-29 NOTE — Progress Notes (Signed)
PCP: Manuel Pal, DO  Subjective:   HPI: Patient is a 73 y.o. male here for bilateral hand pain.  1/13: Patient reports bilateral first Hooks pain.  He notes 10/10 pain which is worse with prolonged grip or using his hands.  His pain is been ongoing for 12 years but has worsened over the last 5.  He denies any specific injury.  He does note morning stiffness in both hands.  He has used ibuprofen which is not been helpful.  He has tried some over-the-counter topical treatments which have also not been helpful.  He denies any significant erythema or swelling.  No numbness or tingling associated.  No associated skin changes.  He also has 5/10 pain in the second and third MCPs of the right hand.  He relates this to prolonged use of a snowblower nearly a decade ago.  He does note some occasional swelling of the joints but no erythema or bruising.  No numbness or tingling.  No skin changes. No h/o gout. No fam hx of RA  2/10: Patient reports primary problem now is left thumb is catching at 8-10/10 level pain and sharp. Right hand pain 5-6/10 primarily at 2nd-3rd MCP joints, less at base of thumb. Left hand still with pain base of thumb but not as bad as pain when thumb catches. Worse in evening. Using voltaren gel twice a day which helps some. No numbness.  Looks like distal part of thumb gets gray at times.  3/11: Patient reports he's doing well. Pain level up to 2/10 base of both thumbs but much improved. Triggering of thumb much improved following injection. Using voltaren gel twice a day. A salve that has hemp and menthol helps also. No skin changes.  Past Medical History:  Diagnosis Date  . Anxiety   . Arthritis   . Blind loop syndrome   . Bulging discs    LOWER BACK  . Diverticulosis   . Family history of malignant neoplasm of gastrointestinal tract   . History of adenomatous polyp of colon   . History of Barrett's esophagus   . History of chronic gastritis   .  Hyperlipidemia   . IBS (irritable bowel syndrome)   . Malignant neoplasm of prostate [facility]) urologist-  Manuel Landry/  oncologist-  Manuel Landry   dx 08-09-2017-- Stage T2a, Gleason 4+5, PSA 3.22 (on finateride)--- treatment ADT and external beam radiation  . PONV (postoperative nausea and vomiting)   . Seasonal allergic rhinitis   . Wears glasses     Current Outpatient Medications on File Prior to Visit  Medication Sig Dispense Refill  . Apoaequorin (PREVAGEN) 10 MG CAPS Take 1 capsule by mouth daily.    Marland Kitchen azelastine (ASTELIN) 137 MCG/SPRAY nasal spray Place 1 spray into the nose as needed. Pt states his spray is 0.1% and he only uses nasal spray if needed.     Marland Kitchen Dextromethorphan-guaiFENesin ([place] DM PO) Take by mouth as needed.    . diazepam (VALIUM) 10 MG tablet Take 1 tablet (10 mg total) by mouth every 12 (twelve) hours as needed for anxiety. 90 tablet 1  . diclofenac sodium (VOLTAREN) 1 % GEL Apply 2 g topically 2 (two) times daily as needed. To painful areas on shoulders 3 Tube 1  . finasteride (PROSCAR) 5 MG tablet Take 5 mg by mouth every evening.     . Glucosamine-Chondroitin (MOVE FREE PO) Take 1 tablet by mouth daily.    Marland Kitchen ibuprofen (ADVIL,MOTRIN) 800 MG tablet Take 800  mg by mouth every 8 (eight) hours as needed. For pain    . magnesium oxide (MAG-OX) 400 MG tablet Take 400 mg by mouth daily.     . Multiple Vitamins-Minerals (OCUVITE ADULT 50+) CAPS Take 1 capsule by mouth daily.    . Pseudoephedrine HCl (SUDAFED PO) Take by mouth as needed.    . tadalafil (CIALIS) 5 MG tablet Take 5 mg by mouth every evening.     . tamsulosin (FLOMAX) 0.4 MG CAPS capsule Take 0.4 mg by mouth every evening.     . tretinoin (RETIN-A) 0.025 % cream APPLY TO AFFECTED AREA EVERY DAY AT BEDTIME  2  . vitamin B-12 (CYANOCOBALAMIN) 1000 MCG tablet Take 1,000 mcg by mouth daily.     No current facility-administered medications on file prior to visit.     Past Surgical History:  Procedure  Laterality Date  . COLONOSCOPY  last one 10-21-2016  Manuel Manuel Landry  . CYSTOSCOPY WITH INSERTION OF UROLIFT N/A 12/15/2017   Procedure: CYSTOSCOPY WITH INSERTION OF UROLIFT, BIOPSY  PROSTATIC URETHAL;  Surgeon: Manuel Gallo, MD;  Location: [facility];  Service: Urology;  Laterality: N/A;  . ESOPHAGOGASTRODUODENOSCOPY (EGD) WITH PROPOFOL  last one 09-26-2013   Manuel Manuel Landry  . INGUINAL HERNIA REPAIR Bilateral right 10-25-1997:  left 09-28-2008 (Manuel Manuel Landry @ [facility])  . LAPAROSCOPIC CHOLECYSTECTOMY  2012  . MANDIBLE SURGERY Bilateral 1990s   removal calcified bone growths  . ROTATOR CUFF REPAIR Right 03-26-2009   Manuel Manuel Landry  [facility]  . SHOULDER OPEN ROTATOR CUFF REPAIR  03/17/2012   Procedure: ROTATOR CUFF REPAIR SHOULDER OPEN;  Surgeon: Manuel Hai, MD;  Location: [facility];  Service: Orthopedics;  Laterality: Left;  LEFT MINI OPEN ROTATOR CUFF REPAIR   . SPACE OAR INSTILLATION N/A 12/15/2017   Procedure: SPACE OAR INSTILLATION;  Surgeon: Manuel Gallo, MD;  Location: [facility];  Service: Urology;  Laterality: N/A;  . TONSILLECTOMY  child    No Known Allergies  Social History   Socioeconomic History  . Marital status: Married    Spouse name: Manuel Landry  . Number of children: 0  . Years of education: Not on file  . Highest education level: Not on file  Occupational History  . Occupation: Retired    Fish farm manager: RETIRED  Social Needs  . Financial resource strain: Not on file  . Food insecurity:    Worry: Not on file    Inability: Not on file  . Transportation needs:    Medical: Not on file    Non-medical: Not on file  Tobacco Use  . Smoking status: Never Smoker  . Smokeless tobacco: Never Used  Substance and Sexual Activity  . Alcohol use: Yes    Comment: occasional  . Drug use: No  . Sexual activity: Yes    Comment: vasectomy 2011  Lifestyle  . Physical activity:    Days per week: Not on file    Minutes per session: Not on file  . Stress: Not on  file  Relationships  . Social connections:    Talks on phone: Not on file    Gets together: Not on file    Attends religious service: Not on file    Active member of club or organization: Not on file    Attends meetings of clubs or organizations: Not on file    Relationship status: Not on file  . Intimate partner violence:    Fear of current or ex partner: Not on file  Emotionally abused: Not on file    Physically abused: Not on file    Forced sexual activity: Not on file  Other Topics Concern  . Not on file  Social History Narrative  . Not on file    Family History  Problem Relation Age of Onset  . Colon cancer Mother 69  . Diabetes Maternal Grandmother   . COPD Sister   . Heart failure Father 57  . Esophageal cancer Neg Hx   . Rectal cancer Neg Hx   . Stomach cancer Neg Hx     BP 127/82   Pulse 93   Ht 5\' 11"  (1.803 m)   Wt 176 lb (79.8 kg)   BMI 24.55 kg/m   Review of Systems: See HPI above.     Objective:  Physical Exam:  Gen: NAD, comfortable in exam room  Left hand/wrist: No catching of 1st digit with flexion of IP.  No bruising, other deformity. Mild hypertrophy 1st CMC. Minimal TTP base 1st CMC.  No other tenderness. FROM digits with 5/5 strength. NVI distally.   Assessment & Plan:  1.  Bilateral thumb and hand pain- improved compared to last visit.  Continue topical medications including voltaren gel.  Call us with any questions or concerns.  F/u prn otherwise.

## 2018-08-02 NOTE — Progress Notes (Signed)
[facility] Counseling Session   The pt presented to session with a calm mood and matching affect. The pt was alert and oriented throughout session.   The pt expressed that in the last few months, he has noticed life changes disrupting his routine. The pt identified that he has been able to adapt to changes in schedule, physical ability, and mood. Initially the pt denied that these life changes were distressing to him, but he expressed that through counseling, he hopes to "find peace within myself" seeming to indicate a desire for change. The pt also reported a change in his medication- he reported beginning to take 5 mg of diazepam a day about a week ago, identifying that he wasn't sure of the exact day when he began taking it. The pt expressed that this medication and dosage seems to have offered relief from his anxious mood over the last week, but he is hoping to develop coping so that he does not have to rely on medication for relief going forward.   The pt and counselor processed the way that the pt has experienced changes in his life. The pt expressed that the strategy of "taking it a day at a time" has been helpful to him, and the counselor affirmed this as a strength. The pt and counselor also discussed goals for ongoing counseling work.   The pt and counselor will meet again on Tuesday, March 24 at 1 pm. This note was entered late due to an Epic error, which has now been resolved.  Doris Cheadle, Counseling Intern  [phone]

## 2018-08-10 ENCOUNTER — Ambulatory Visit: Payer: Medicare Other | Admitting: Family Medicine

## 2018-08-10 NOTE — Progress Notes (Signed)
[place] pt to notify of inability to provide remote counseling services and discuss ongoing support and referrals. Was unable to reach pt via phone, so counselor sent an email including information about termination, counseling referral options, and contact information for the Patient and [facility] if additional needs arise. When the pt and counselor spoke on the phone on 3/17 the pt indicated feeling comfortable terminating counseling services at this time.  Doris Cheadle, Counseling Intern [phone]

## 2018-09-19 IMAGING — NM NM BONE WHOLE BODY
2 series · 2 of 2 positions shown · non-contrast
Comparison: Abdominal CT 4 days prior.

CLINICAL DATA: New diagnosis prostate cancer

EXAM:
NUCLEAR MEDICINE WHOLE BODY BONE SCAN
TECHNIQUE: Whole body anterior and posterior images were obtained approximately
3 hours after intravenous injection of radiopharmaceutical.
RADIOPHARMACEUTICALS:  21 mCi 2echnetium-77m MDP IV

[Series 1: wbr_bone_40 whole body · 2.66mm/px · 1 of 1 slices shown (1 of 2)]
[im 1/1]
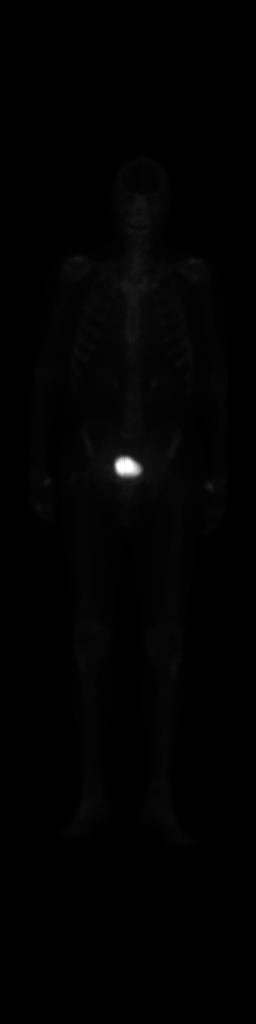

[Series 1: wbr_bone_40 whole body · 2.66mm/px · 1 of 1 slices shown (2 of 2)]
[im 1/1]
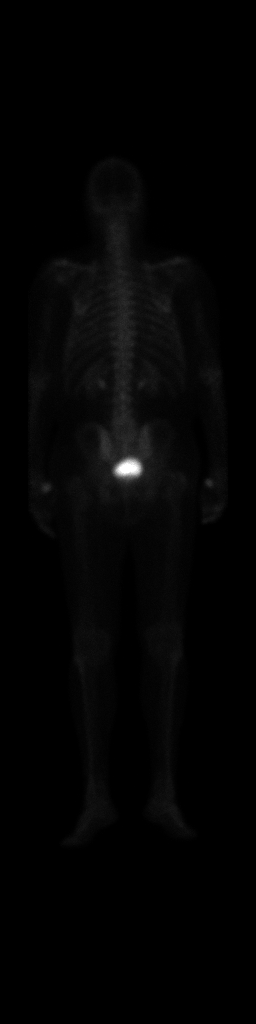

[2 of 2 positions shown; findings below may reference images not displayed]

FINDINGS: Negative for metastatic pattern. There is degenerative type uptake
at the base of thumbs and shoulders. Symmetric renal activity.
Bladder activity present.
IMPRESSION: Negative for metastatic pattern.

## 2018-09-22 ENCOUNTER — Encounter: Payer: Self-pay | Admitting: Internal Medicine

## 2018-10-11 ENCOUNTER — Emergency Department (INDEPENDENT_AMBULATORY_CARE_PROVIDER_SITE_OTHER)
Admission: EM | Admit: 2018-10-11 | Discharge: 2018-10-11 | Disposition: A | Discharge status: Home / Self Care | Payer: Medicare Other | Source: Home / Self Care

## 2018-10-11 ENCOUNTER — Encounter: Payer: Self-pay | Admitting: Family Medicine

## 2018-10-11 ENCOUNTER — Ambulatory Visit: Payer: Self-pay | Admitting: Family Medicine

## 2018-10-11 ENCOUNTER — Other Ambulatory Visit: Payer: Self-pay

## 2018-10-11 DIAGNOSIS — R030 Elevated blood-pressure reading, without diagnosis of hypertension: Secondary | ICD-10-CM | POA: Diagnosis not present

## 2018-10-11 DIAGNOSIS — S61211A Laceration without foreign body of left index finger without damage to nail, initial encounter: Secondary | ICD-10-CM | POA: Diagnosis not present

## 2018-10-11 NOTE — Telephone Encounter (Signed)
Pt. Cut his left index finger with a knife 2 hours ago. The nurse at his home applied steri-strips. Area is still bleeding/oozing per pt. Reports "it was a clean cut."Wants to know if some one in the office could look at it and determine if he needs stitches.No answer at [employer] line. Please advise pt. Contact number [phone]. Answer Assessment - Initial Assessment Questions 1. MECHANISM: "How did the injury happen?"      Knife 2. ONSET: "When did the injury happen?" (Minutes or hours ago)      2 hours 3. LOCATION: "What part of the finger is injured?" "Is the nail damaged?"      Left index finger 4. APPEARANCE of the INJURY: "What does the injury look like?"      Clean cut 5. SEVERITY: "Can you use the hand normally?"  "Can you bend your fingers into a ball and then fully open them?"     No problem 6. SIZE: For cuts, bruises, or swelling, ask: "How large is it?" (e.g., inches or centimeters;  entire finger)      tip 7. PAIN: "Is there pain?" If so, ask: "How bad is the pain?"    (e.g., Scale 1-10; or mild, moderate, severe)     Moderate 8. TETANUS: For any breaks in the skin, ask: "When was the last tetanus booster?"     Unsure 9. OTHER SYMPTOMS: "Do you have any other symptoms?"     No 10. PREGNANCY: "Is there any chance you are pregnant?" "When was your last menstrual period?"       N/A  Protocols used: FINGER INJURY-A-AH

## 2018-10-11 NOTE — ED Provider Notes (Signed)
[facility]    CSN: [id number] Arrival date & time: 10/11/18  1456     History   Chief Complaint Chief Complaint  Patient presents with  . Laceration    HPI AHMADOU BOLZ is a 73 y.o. male.   HPI  OVA MEEGAN is a 73 y.o. male presenting to UC with c/o laceration to the end of his Left index finger that occurred around 12PM this morning. Pt cut it on a knife.  He initially went to a clinic near his house and had steri-strips and a bandage applied but he states his wound has continued to bleed through the bandage. He is not on blood thinners.  BP elevated in triage. No hx of HTN but notes his BP is high in doctor's offices.    Past Medical History:  Diagnosis Date  . Anxiety   . Arthritis   . Blind loop syndrome   . Bulging discs    LOWER BACK  . Diverticulosis   . Family history of malignant neoplasm of gastrointestinal tract   . History of adenomatous polyp of colon   . History of Barrett's esophagus   . History of chronic gastritis   . Hyperlipidemia   . IBS (irritable bowel syndrome)   . Malignant neoplasm of prostate [facility]) urologist-  dr dahlstedt/  oncologist-  dr Tammi Klippel   dx 08-09-2017-- Stage T2a, Gleason 4+5, PSA 3.22 (on finateride)--- treatment ADT and external beam radiation  . PONV (postoperative nausea and vomiting)   . Seasonal allergic rhinitis   . Wears glasses     Patient Active Problem List   Diagnosis Date Noted  . Malignant neoplasm of prostate ([place]) 09/06/2017  . Hyperlipidemia 01/27/2017  . Cardiovascular risk factor 08/12/2011  . S/P cholecystectomy 06/22/2011  . Personal history of colonic polyps 06/22/2011  . CHOLELITHIASIS 02/13/2010  . FLATULENCE-GAS-BLOATING 02/13/2010  . CHOLELITHIASIS 02/11/2010  . GERD 02/10/2010  . DIVERTICULOSIS-COLON 02/10/2010  . NAUSEA ALONE 02/10/2010  . ABDOMINAL PAIN RIGHT UPPER QUADRANT 02/10/2010  . ABDOMINAL PAIN, EPIGASTRIC 02/10/2010  . ANEMIA-IRON DEFICIENCY 07/17/2008   . INGUINAL HERNIA 07/17/2008  . IRRITABLE BOWEL SYNDROME 07/17/2008  . GASTRITIS 07/11/2008  . DUODENITIS 07/11/2008  . RENAL CYST, RIGHT 07/11/2008  . DYSPEPSIA 06/12/2008  . CONSTIPATION 05/31/2008  . BACK PAIN, CHRONIC 05/31/2008  . BENIGN PROSTATIC HYPERTROPHY, MILD, HX OF 05/31/2008    Past Surgical History:  Procedure Laterality Date  . COLONOSCOPY  last one 10-21-2016  dr pyrtle  . CYSTOSCOPY WITH INSERTION OF UROLIFT N/A 12/15/2017   Procedure: CYSTOSCOPY WITH INSERTION OF UROLIFT, BIOPSY  PROSTATIC URETHAL;  Surgeon: Franchot Gallo, MD;  Location: [facility];  Service: Urology;  Laterality: N/A;  . ESOPHAGOGASTRODUODENOSCOPY (EGD) WITH PROPOFOL  last one 09-26-2013   dr pyrtle  . INGUINAL HERNIA REPAIR Bilateral right 10-25-1997:  left 09-28-2008 (dr m. Hassell Done @ [facility])  . LAPAROSCOPIC CHOLECYSTECTOMY  2012  . MANDIBLE SURGERY Bilateral 1990s   removal calcified bone growths  . ROTATOR CUFF REPAIR Right 03-26-2009   dr Tonita Cong  [facility]  . SHOULDER OPEN ROTATOR CUFF REPAIR  03/17/2012   Procedure: ROTATOR CUFF REPAIR SHOULDER OPEN;  Surgeon: Johnn Hai, MD;  Location[facility];  Service: Orthopedics;  Laterality: Left;  LEFT MINI OPEN ROTATOR CUFF REPAIR   . SPACE OAR INSTILLATION N/A 12/15/2017   Procedure: SPACE OAR INSTILLATION;  Surgeon: Franchot Gallo, MD;  Location: [facility];  Service: Urology;  Laterality: N/A;  . TONSILLECTOMY  child       Home Medications    Prior to Admission medications   Medication Sig Start Date End Date Taking? Authorizing Provider  Apoaequorin (PREVAGEN) 10 MG CAPS Take 1 capsule by mouth daily.    [provider]  azelastine (ASTELIN) 137 MCG/SPRAY nasal spray Place 1 spray into the nose as needed. Pt states his spray is 0.1% and he only uses nasal spray if needed.     [provider]  Dextromethorphan-guaiFENesin [facility] DM PO) Take by mouth as needed.    [provider]   diazepam (VALIUM) 10 MG tablet Take 1 tablet (10 mg total) by mouth every 12 (twelve) hours as needed for anxiety. 07/07/18   [name], DO  diclofenac sodium (VOLTAREN) 1 % GEL Apply 2 g topically 2 (two) times daily as needed. To painful areas on shoulders 06/27/18   Hudnall, Sharyn Lull, MD  finasteride (PROSCAR) 5 MG tablet Take 5 mg by mouth every evening.     [provider]  Glucosamine-Chondroitin (MOVE FREE PO) Take 1 tablet by mouth daily.    [provider]  ibuprofen (ADVIL,MOTRIN) 800 MG tablet Take 800 mg by mouth every 8 (eight) hours as needed. For pain    [provider]  magnesium oxide (MAG-OX) 400 MG tablet Take 400 mg by mouth daily.     [provider]  Multiple Vitamins-Minerals (OCUVITE ADULT 50+) CAPS Take 1 capsule by mouth daily.    [provider]  Pseudoephedrine HCl (SUDAFED PO) Take by mouth as needed.    [provider]  tadalafil (CIALIS) 5 MG tablet Take 5 mg by mouth every evening.     [provider]  tamsulosin (FLOMAX) 0.4 MG CAPS capsule Take 0.4 mg by mouth every evening.     [provider]  tretinoin (RETIN-A) 0.025 % cream APPLY TO AFFECTED AREA EVERY DAY AT BEDTIME 03/22/18   [provider]  vitamin B-12 (CYANOCOBALAMIN) 1000 MCG tablet Take 1,000 mcg by mouth daily.    [provider]    Family History Family History  Problem Relation Age of Onset  . Colon cancer Mother 7  . Diabetes Maternal Grandmother   . COPD Sister   . Heart failure Father 36  . Esophageal cancer Neg Hx   . Rectal cancer Neg Hx   . Stomach cancer Neg Hx     Social History Social History   Tobacco Use  . Smoking status: Never Smoker  . Smokeless tobacco: Never Used  Substance Use Topics  . Alcohol use: Yes    Comment: occasional  . Drug use: No     Allergies   Patient has no known allergies.   Review of Systems Review of Systems  Musculoskeletal: Negative for  arthralgias and joint swelling.  Skin: Positive for wound. Negative for color change.  Neurological: Negative for weakness and numbness.     Physical Exam Triage Vital Signs ED Triage Vitals  Enc Vitals Group     BP 10/11/18 1531 (!) 172/100     Pulse Rate 10/11/18 1531 95     Resp 10/11/18 1531 20     Temp 10/11/18 1531 (!) 97.1 F (36.2 C)     Temp Source 10/11/18 1531 Oral     SpO2 10/11/18 1531 96 %     Weight 10/11/18 1535 171 lb (77.6 kg)     Height 10/11/18 1535 5\' 11"  (1.803 m)     Head Circumference --  Peak Flow --      Pain Score 10/11/18 1532 5     Pain Loc --      Pain Edu? --      Excl. in [place]? --    No data found.  Updated Vital Signs BP (!) 161/94   Pulse 95   Temp (!) 97.1 F (36.2 C) (Oral)   Resp 20   Ht 5\' 11"  (1.803 m)   Wt 171 lb (77.6 kg)   SpO2 96%   BMI 23.85 kg/m   Visual Acuity Right Eye Distance:   Left Eye Distance:   Bilateral Distance:    Right Eye Near:   Left Eye Near:    Bilateral Near:     Physical Exam Vitals signs and nursing note reviewed.  Constitutional:      Appearance: He is well-developed.  HENT:     Head: Normocephalic and atraumatic.  Neck:     Musculoskeletal: Normal range of motion.  Cardiovascular:     Rate and Rhythm: Normal rate.  Pulmonary:     Effort: Pulmonary effort is normal.  Musculoskeletal: Normal range of motion.        General: Swelling present. No tenderness.     Comments: Left distal index finger: minimal swelling.   Skin:    General: Skin is warm and dry.     Capillary Refill: Capillary refill takes less than 2 seconds.     Comments: Left index finger, distal volar aspect: 0.5cm superficial laceration. 3 small steri-strips in place, surrounding fresh dried red blood.   Neurological:     Mental Status: He is alert and oriented to person, place, and time.  Psychiatric:        Behavior: Behavior normal.      UC Treatments / Results  Labs (all labs ordered are listed, but only  abnormal results are displayed) Labs Reviewed - No data to display  EKG None  Radiology No results found.  Procedures Laceration Repair Date/Time: 10/11/2018 6:33 PM Performed by: Noe Gens, PA-C Authorized by: Noe Gens, PA-C   Consent:    Consent obtained:  Verbal   Consent given by:  Patient   Risks discussed:  Infection, pain and need for additional repair   Alternatives discussed:  No treatment and delayed treatment Anesthesia (see MAR for exact dosages):    Anesthesia method:  None Laceration details:    Location:  Finger   Finger location:  L index finger   Length (cm):  0.5   Depth (mm):  2 Exploration:    Hemostasis achieved with:  Direct pressure   Wound exploration: wound explored through full range of motion and entire depth of wound probed and visualized     Wound exploration comment:  Old steri-strips easily removed   Wound extent: no areolar tissue violation noted, no fascia violation noted, no foreign bodies/material noted, no muscle damage noted, no nerve damage noted, no tendon damage noted, no underlying fracture noted and no vascular damage noted     Contaminated: no   Treatment:    Area cleansed with:  Saline   Amount of cleaning:  Standard Skin repair:    Repair method:  Steri-Strips and tissue adhesive   Number of Steri-Strips:  3 Approximation:    Approximation:  Close Post-procedure details:    Dressing:  Non-adherent dressing   Patient tolerance of procedure:  Tolerated well, no immediate complications   (including critical care time)  Medications Ordered in UC Medications - No data  to display  Initial Impression / Assessment and Plan / UC Course  I have reviewed the triage vital signs and the nursing notes.  Pertinent labs & imaging results that were available during my care of the patient were reviewed by me and considered in my medical decision making (see chart for details).     Prior steri-strips removed. Wound cleaned  and closed as noted above Home care info provided  Final Clinical Impressions(s) / UC Diagnoses   Final diagnoses:  Laceration of left index finger without foreign body without damage to nail, initial encounter  Elevated blood pressure reading     Discharge Instructions      You may keep bandage on for 24 hours then replace with a new clean dry bandage. The wound glue and steri-strips will gradually come off on their own over the next 1-2 weeks with normal bathing and handwashing. Do not soak your ands and avoid getting wound wet for the first 48 hours to allow initial wound healing. If you develop redness, swelling, pain or fever, these are signs of infection. Please have the wound reevaluated as soon as possible if you develop any of these symptoms.   Please monitor your blood pressure and follow up with your family doctor for ongoing healthcare needs including blood pressure monitoring.     ED Prescriptions    None     Controlled Substance Prescriptions [place] Controlled Substance Registry consulted? Not Applicable   Tyrell Antonio 10/11/18 3662

## 2018-10-11 NOTE — ED Triage Notes (Signed)
Laceration to left pointer finger.  Cut with a knife around noon today.

## 2018-10-11 NOTE — Telephone Encounter (Signed)
Needs to go to urgent care for possible stitches.

## 2018-10-11 NOTE — Discharge Instructions (Addendum)
°  You may keep bandage on for 24 hours then replace with a new clean dry bandage. The wound glue and steri-strips will gradually come off on their own over the next 1-2 weeks with normal bathing and handwashing. Do not soak your ands and avoid getting wound wet for the first 48 hours to allow initial wound healing. If you develop redness, swelling, pain or fever, these are signs of infection. Please have the wound reevaluated as soon as possible if you develop any of these symptoms.   Please monitor your blood pressure and follow up with your family doctor for ongoing healthcare needs including blood pressure monitoring.

## 2018-10-12 ENCOUNTER — Encounter: Payer: Self-pay | Admitting: Family Medicine

## 2018-10-13 ENCOUNTER — Telehealth: Payer: Self-pay | Admitting: Family Medicine

## 2018-10-13 NOTE — Telephone Encounter (Signed)
Copied from [place] [id number]. Topic: General - Other >> Oct 12, 2018 12:06 PM Keene Breath wrote: Reason for CRM: Patient is returning a call to the office.  Patient stated he could not get to the call in time.  Please call patient back at [phone]  Patient scheduled appt on 10/28/2018 already---patient had laceration on finger (index) left,.Was seen by RN at [facility] strips used, but continued to bleed and then went to [facility]--they glued and steri strips used. Finger looking much better.

## 2018-10-28 ENCOUNTER — Ambulatory Visit: Payer: Medicare Other | Admitting: Family Medicine

## 2018-10-31 DIAGNOSIS — L720 Epidermal cyst: Secondary | ICD-10-CM | POA: Diagnosis not present

## 2018-10-31 DIAGNOSIS — D2262 Melanocytic nevi of left upper limb, including shoulder: Secondary | ICD-10-CM | POA: Diagnosis not present

## 2018-10-31 DIAGNOSIS — L570 Actinic keratosis: Secondary | ICD-10-CM | POA: Diagnosis not present

## 2018-10-31 DIAGNOSIS — D485 Neoplasm of uncertain behavior of skin: Secondary | ICD-10-CM | POA: Diagnosis not present

## 2018-10-31 DIAGNOSIS — L821 Other seborrheic keratosis: Secondary | ICD-10-CM | POA: Diagnosis not present

## 2018-10-31 DIAGNOSIS — D225 Melanocytic nevi of trunk: Secondary | ICD-10-CM | POA: Diagnosis not present

## 2018-10-31 DIAGNOSIS — D2261 Melanocytic nevi of right upper limb, including shoulder: Secondary | ICD-10-CM | POA: Diagnosis not present

## 2018-10-31 DIAGNOSIS — L814 Other melanin hyperpigmentation: Secondary | ICD-10-CM | POA: Diagnosis not present

## 2018-10-31 DIAGNOSIS — D1801 Hemangioma of skin and subcutaneous tissue: Secondary | ICD-10-CM | POA: Diagnosis not present

## 2018-11-02 DIAGNOSIS — C61 Malignant neoplasm of prostate: Secondary | ICD-10-CM | POA: Diagnosis not present

## 2018-11-09 DIAGNOSIS — N400 Enlarged prostate without lower urinary tract symptoms: Secondary | ICD-10-CM | POA: Diagnosis not present

## 2018-11-09 DIAGNOSIS — C61 Malignant neoplasm of prostate: Secondary | ICD-10-CM | POA: Diagnosis not present

## 2018-11-11 ENCOUNTER — Telehealth: Payer: Self-pay | Admitting: Internal Medicine

## 2018-11-11 NOTE — Telephone Encounter (Signed)
On reviewing his last EGD, there was no Barrett's by biopsy though Barrett's was found more remotely I think it is reasonable to not perform any additional surveillance upper endoscopies going forward That said if he is having issues with heartburn, trouble swallowing, early fullness, nausea or vomiting he should notify us

## 2018-11-11 NOTE — Telephone Encounter (Signed)
Pt received recall letter for EGD, barrett's esophagus. States he is not having any issues at this time and would like to wait until he has finished his treatments for prostate cancer before scheduling the EGD. Also wanted to know if he really needs to have another EGD since he is not having any issues. Please advise.

## 2018-11-11 NOTE — Telephone Encounter (Signed)
Spoke with pt and he is aware of Dr. Pyrtle's recommendations. 

## 2018-11-17 ENCOUNTER — Other Ambulatory Visit: Payer: Self-pay

## 2018-11-17 ENCOUNTER — Ambulatory Visit: Payer: Self-pay

## 2018-11-17 ENCOUNTER — Encounter: Payer: Self-pay | Admitting: Family Medicine

## 2018-11-17 ENCOUNTER — Ambulatory Visit (INDEPENDENT_AMBULATORY_CARE_PROVIDER_SITE_OTHER): Payer: Medicare Other | Admitting: Family Medicine

## 2018-11-17 VITALS — BP 125/76 | HR 96 | Ht 71.0 in | Wt 177.0 lb

## 2018-11-17 DIAGNOSIS — M65312 Trigger thumb, left thumb: Secondary | ICD-10-CM | POA: Insufficient documentation

## 2018-11-17 MED ORDER — TRIAMCINOLONE ACETONIDE 40 MG/ML IJ SUSP
20.0000 mg | Freq: Once | INTRAMUSCULAR | Status: AC
  Administered 2018-11-17: 20 mg via INTRA_ARTICULAR

## 2018-11-17 NOTE — Progress Notes (Signed)
Manuel Landry - 73 y.o. male MRN [id number]  Date of birth: 23-May-1945  SUBJECTIVE:  Including CC & ROS.  Chief Complaint  Patient presents with  . Follow-up    follow up for left thumb    Manuel Landry is a 73 y.o. male that is presenting with triggering of the left thumb.  This is been ongoing for a couple of weeks.  The triggering is worse in the morning.  The pain can be an 8 out of 10.  He has a history of previous trigger fingers.  Denies any inciting event.  Pain is intermittent in nature.  It is sharp in nature.  No improvement with home modalities.  Feels like symptoms are staying the same.   Review of Systems  Constitutional: Negative for fever.  HENT: Negative for congestion.   Respiratory: Negative for cough.   Cardiovascular: Negative for chest pain.  Gastrointestinal: Negative for abdominal pain.  Musculoskeletal: Positive for arthralgias.  Skin: Negative for color change.  Neurological: Negative for weakness.  Hematological: Negative for adenopathy.    HISTORY: Past Medical, Surgical, Social, and Family History Reviewed & Updated per EMR.   Pertinent Historical Findings include:  Past Medical History:  Diagnosis Date  . Anxiety   . Arthritis   . Blind loop syndrome   . Bulging discs    LOWER BACK  . Diverticulosis   . Family history of malignant neoplasm of gastrointestinal tract   . History of adenomatous polyp of colon   . History of Barrett's esophagus   . History of chronic gastritis   . Hyperlipidemia   . IBS (irritable bowel syndrome)   . Malignant neoplasm of prostate [facility]) urologist-  dr dahlstedt/  oncologist-  dr Tammi Klippel   dx 08-09-2017-- Stage T2a, Gleason 4+5, PSA 3.22 (on finateride)--- treatment ADT and external beam radiation  . PONV (postoperative nausea and vomiting)   . Seasonal allergic rhinitis   . Wears glasses     Past Surgical History:  Procedure Laterality Date  . COLONOSCOPY  last one 10-21-2016  dr pyrtle  .  CYSTOSCOPY WITH INSERTION OF UROLIFT N/A 12/15/2017   Procedure: CYSTOSCOPY WITH INSERTION OF UROLIFT, BIOPSY  PROSTATIC URETHAL;  Surgeon: Franchot Gallo, MD;  Location: [facility];  Service: Urology;  Laterality: N/A;  . ESOPHAGOGASTRODUODENOSCOPY (EGD) WITH PROPOFOL  last one 09-26-2013   dr pyrtle  . INGUINAL HERNIA REPAIR Bilateral right 10-25-1997:  left 09-28-2008 (dr m. Hassell Done @ [facility])  . LAPAROSCOPIC CHOLECYSTECTOMY  2012  . MANDIBLE SURGERY Bilateral 1990s   removal calcified bone growths  . ROTATOR CUFF REPAIR Right 03-26-2009   dr Tonita Cong  [facility]  . SHOULDER OPEN ROTATOR CUFF REPAIR  03/17/2012   Procedure: ROTATOR CUFF REPAIR SHOULDER OPEN;  Surgeon: Johnn Hai, MD;  Location: [facility];  Service: Orthopedics;  Laterality: Left;  LEFT MINI OPEN ROTATOR CUFF REPAIR   . SPACE OAR INSTILLATION N/A 12/15/2017   Procedure: SPACE OAR INSTILLATION;  Surgeon: Franchot Gallo, MD;  Location: [facility];  Service: Urology;  Laterality: N/A;  . TONSILLECTOMY  child    No Known Allergies  Family History  Problem Relation Age of Onset  . Colon cancer Mother 37  . Diabetes Maternal Grandmother   . COPD Sister   . Heart failure Father 64  . Esophageal cancer Neg Hx   . Rectal cancer Neg Hx   . Stomach cancer Neg Hx      Social History   Socioeconomic  History  . Marital status: Married    Spouse name: Caren Griffins  . Number of children: 0  . Years of education: Not on file  . Highest education level: Not on file  Occupational History  . Occupation: Retired    Fish farm manager: RETIRED  Social Needs  . Financial resource strain: Not on file  . Food insecurity    Worry: Not on file    Inability: Not on file  . Transportation needs    Medical: Not on file    Non-medical: Not on file  Tobacco Use  . Smoking status: Never Smoker  . Smokeless tobacco: Never Used  Substance and Sexual Activity  . Alcohol use: Yes    Comment: occasional  . Drug use: No   . Sexual activity: Yes    Comment: vasectomy 2011  Lifestyle  . Physical activity    Days per week: Not on file    Minutes per session: Not on file  . Stress: Not on file  Relationships  . Social Herbalist on phone: Not on file    Gets together: Not on file    Attends religious service: Not on file    Active member of club or organization: Not on file    Attends meetings of clubs or organizations: Not on file    Relationship status: Not on file  . Intimate partner violence    Fear of current or ex partner: Not on file    Emotionally abused: Not on file    Physically abused: Not on file    Forced sexual activity: Not on file  Other Topics Concern  . Not on file  Social History Narrative  . Not on file     PHYSICAL EXAM:  VS: BP 125/76   Pulse 96   Ht 5\' 11"  (1.803 m)   Wt 177 lb (80.3 kg)   BMI 24.69 kg/m  Physical Exam Gen: NAD, alert, cooperative with exam, well-appearing ENT: normal lips, normal nasal mucosa,  Eye: normal EOM, normal conjunctiva and lids CV:  no edema, +2 pedal pulses   Resp: no accessory muscle use, non-labored,  Skin: no rashes, no areas of induration  Neuro: normal tone, normal sensation to touch Psych:  normal insight, alert and oriented MSK:  Left thumb: Triggering upon flexion. Tenderness palpation of the flexor tendon. Normal range of motion. Normal opposition Neurovascular intact   Aspiration/Injection Procedure Note Manuel Landry [birth year]  Procedure: Injection Indications: Left trigger thumb  Procedure Details Consent: Risks of procedure as well as the alternatives and risks of each were explained to the (patient/caregiver).  Consent for procedure obtained. Time Out: Verified patient identification, verified procedure, site/side was marked, verified correct patient position, special equipment/implants available, medications/allergies/relevent history reviewed, required imaging and test results available.   Performed.  The area was cleaned with iodine and alcohol swabs.    The left trigger thumb was injected using 0.5 cc's of 40 mg Kenalog and 0.5 cc's of 0.25% bupivacaine with a 25 1 1/2" needle.  Ultrasound was used. Images were obtained in long views showing the injection.     A sterile dressing was applied.  Patient did tolerate procedure well.       ASSESSMENT & PLAN:   Trigger finger of left thumb Triggering of the thumb evident today. -Injection. -Counseled on home exercise therapy and supportive care. -Placed in thumb spica splint. -If no improvement can consider reinjection or physical therapy.

## 2018-11-17 NOTE — Patient Instructions (Signed)
Nice to meet you Please wear the thumb brace at night or when you're doing anything with your hands or repetitive. Wear this for 3-4 weeks    Please send me a message in MyChart with any questions or updates.  Please see me back in 4 weeks.   --Dr. Raeford Razor

## 2018-11-22 NOTE — Assessment & Plan Note (Signed)
Triggering of the thumb evident today. -Injection. -Counseled on home exercise therapy and supportive care. -Placed in thumb spica splint. -If no improvement can consider reinjection or physical therapy.

## 2018-12-15 ENCOUNTER — Ambulatory Visit: Payer: Medicare Other | Admitting: Family Medicine

## 2018-12-16 DIAGNOSIS — H5213 Myopia, bilateral: Secondary | ICD-10-CM | POA: Diagnosis not present

## 2018-12-16 DIAGNOSIS — H2513 Age-related nuclear cataract, bilateral: Secondary | ICD-10-CM | POA: Diagnosis not present

## 2018-12-16 DIAGNOSIS — H40013 Open angle with borderline findings, low risk, bilateral: Secondary | ICD-10-CM | POA: Diagnosis not present

## 2018-12-19 ENCOUNTER — Other Ambulatory Visit: Payer: Self-pay

## 2018-12-19 ENCOUNTER — Encounter: Payer: Self-pay | Admitting: Family Medicine

## 2018-12-19 ENCOUNTER — Ambulatory Visit (INDEPENDENT_AMBULATORY_CARE_PROVIDER_SITE_OTHER): Payer: Medicare Other | Admitting: Family Medicine

## 2018-12-19 DIAGNOSIS — M65312 Trigger thumb, left thumb: Secondary | ICD-10-CM | POA: Diagnosis not present

## 2018-12-19 NOTE — Progress Notes (Signed)
Manuel Landry - 73 y.o. male MRN [id number]  Date of birth: 22-Oct-1945  SUBJECTIVE:  Including CC & ROS.  No chief complaint on file.   Manuel Landry is a 73 y.o. male that is following up for his left trigger thumb.  He reports improvement but still occasionally has triggering intermittently.  He bought an over-the-counter brace with improvement of his symptoms.  Pain is intermittent and very mild.      Review of Systems  Constitutional: Negative for fever.  HENT: Negative for congestion.   Respiratory: Negative for cough.   Cardiovascular: Negative for chest pain.  Gastrointestinal: Negative for abdominal pain.  Musculoskeletal: Positive for arthralgias.  Skin: Negative for color change.  Neurological: Negative for weakness.  Hematological: Negative for adenopathy.    HISTORY: Past Medical, Surgical, Social, and Family History Reviewed & Updated per EMR.   Pertinent Historical Findings include:  Past Medical History:  Diagnosis Date  . Anxiety   . Arthritis   . Blind loop syndrome   . Bulging discs    LOWER BACK  . Diverticulosis   . Family history of malignant neoplasm of gastrointestinal tract   . History of adenomatous polyp of colon   . History of Barrett's esophagus   . History of chronic gastritis   . Hyperlipidemia   . IBS (irritable bowel syndrome)   . Malignant neoplasm of prostate [facility]) urologist-  dr dahlstedt/  oncologist-  dr Tammi Klippel   dx 08-09-2017-- Stage T2a, Gleason 4+5, PSA 3.22 (on finateride)--- treatment ADT and external beam radiation  . PONV (postoperative nausea and vomiting)   . Seasonal allergic rhinitis   . Wears glasses     Past Surgical History:  Procedure Laterality Date  . COLONOSCOPY  last one 10-21-2016  dr pyrtle  . CYSTOSCOPY WITH INSERTION OF UROLIFT N/A 12/15/2017   Procedure: CYSTOSCOPY WITH INSERTION OF UROLIFT, BIOPSY  PROSTATIC URETHAL;  Surgeon: Franchot Gallo, MD;  Location: [facility];   Service: Urology;  Laterality: N/A;  . ESOPHAGOGASTRODUODENOSCOPY (EGD) WITH PROPOFOL  last one 09-26-2013   dr pyrtle  . INGUINAL HERNIA REPAIR Bilateral right 10-25-1997:  left 09-28-2008 (dr m. Hassell Done @ [facility])  . LAPAROSCOPIC CHOLECYSTECTOMY  2012  . MANDIBLE SURGERY Bilateral 1990s   removal calcified bone growths  . ROTATOR CUFF REPAIR Right 03-26-2009   dr Tonita Cong  [facility]  . SHOULDER OPEN ROTATOR CUFF REPAIR  03/17/2012   Procedure: ROTATOR CUFF REPAIR SHOULDER OPEN;  Surgeon: Johnn Hai, MD;  Location: [facility];  Service: Orthopedics;  Laterality: Left;  LEFT MINI OPEN ROTATOR CUFF REPAIR   . SPACE OAR INSTILLATION N/A 12/15/2017   Procedure: SPACE OAR INSTILLATION;  Surgeon: Franchot Gallo, MD;  Location: [facility];  Service: Urology;  Laterality: N/A;  . TONSILLECTOMY  child    No Known Allergies  Family History  Problem Relation Age of Onset  . Colon cancer Mother 46  . Diabetes Maternal Grandmother   . COPD Sister   . Heart failure Father 84  . Esophageal cancer Neg Hx   . Rectal cancer Neg Hx   . Stomach cancer Neg Hx      Social History   Socioeconomic History  . Marital status: Married    Spouse name: Caren Griffins  . Number of children: 0  . Years of education: Not on file  . Highest education level: Not on file  Occupational History  . Occupation: Retired    Fish farm manager: RETIRED  Social Needs  .  Financial resource strain: Not on file  . Food insecurity    Worry: Not on file    Inability: Not on file  . Transportation needs    Medical: Not on file    Non-medical: Not on file  Tobacco Use  . Smoking status: Never Smoker  . Smokeless tobacco: Never Used  Substance and Sexual Activity  . Alcohol use: Yes    Comment: occasional  . Drug use: No  . Sexual activity: Yes    Comment: vasectomy 2011  Lifestyle  . Physical activity    Days per week: Not on file    Minutes per session: Not on file  . Stress: Not on file  Relationships  .  Social Herbalist on phone: Not on file    Gets together: Not on file    Attends religious service: Not on file    Active member of club or organization: Not on file    Attends meetings of clubs or organizations: Not on file    Relationship status: Not on file  . Intimate partner violence    Fear of current or ex partner: Not on file    Emotionally abused: Not on file    Physically abused: Not on file    Forced sexual activity: Not on file  Other Topics Concern  . Not on file  Social History Narrative  . Not on file     PHYSICAL EXAM:  VS: There were no vitals taken for this visit. Physical Exam Gen: NAD, alert, cooperative with exam, well-appearing ENT: normal lips, normal nasal mucosa,  Eye: normal EOM, normal conjunctiva and lids CV:  no edema, +2 pedal pulses   Resp: no accessory muscle use, non-labored,  Skin: no rashes, no areas of induration  Neuro: normal tone, normal sensation to touch Psych:  normal insight, alert and oriented MSK:  Left thumb: Mild triggering upon flexion. Nodule appreciated at the A1 pulley. Bossing of the [facility] [place] joint. Neurovascularly intact     ASSESSMENT & PLAN:   Trigger finger of left thumb Has had improvement but still intermittently triggers. -Continue the brace he bought at night. - provided pennsaid samples.  -Can follow-up in 2 to 3 months.  Could consider injection versus surgery.

## 2018-12-19 NOTE — Assessment & Plan Note (Addendum)
Has had improvement but still intermittently triggers. -Continue the brace he bought at night. - provided pennsaid samples.  -Can follow-up in 2 to 3 months.  Could consider injection versus surgery.

## 2018-12-19 NOTE — Patient Instructions (Signed)
Good to see you Please try the pennsaid  Please continue the brace at night  Please try ice  Please send me a message in MyChart with any questions or updates.  Please see me back in 2-3 months.   --Dr. Raeford Razor

## 2018-12-19 NOTE — Progress Notes (Signed)
Medication Samples have been provided to the patient.  Drug name: Pennsaid      Strength: 2%        Qty: 2 Boxes  LOT: [id number]  Exp.Date: 06/2019  Dosing instructions: Use peasize amount and rub gently.  The patient has been instructed regarding the correct time, dose, and frequency of taking this medication, including desired effects and most common side effects.   Sherrie George, MA 11:14 AM 12/19/2018

## 2018-12-26 ENCOUNTER — Ambulatory Visit: Payer: Medicare Other | Admitting: Family Medicine

## 2018-12-27 ENCOUNTER — Other Ambulatory Visit: Payer: Self-pay

## 2018-12-27 ENCOUNTER — Ambulatory Visit (INDEPENDENT_AMBULATORY_CARE_PROVIDER_SITE_OTHER): Payer: Medicare Other | Admitting: Family Medicine

## 2018-12-27 ENCOUNTER — Encounter: Payer: Self-pay | Admitting: Family Medicine

## 2018-12-27 VITALS — BP 128/84 | HR 64 | Temp 98.5°F | Ht 71.0 in | Wt 176.0 lb

## 2018-12-27 DIAGNOSIS — F418 Other specified anxiety disorders: Secondary | ICD-10-CM | POA: Insufficient documentation

## 2018-12-27 DIAGNOSIS — F419 Anxiety disorder, unspecified: Secondary | ICD-10-CM | POA: Diagnosis not present

## 2018-12-27 NOTE — Progress Notes (Signed)
Chief Complaint  Patient presents with  . Follow-up    Subjective: Patient is a 73 y.o. male here for f/u situational anxiety.  Pt has been receiving Lupron injections for prostate cancer. This is causing anxiety for which e is taking valium intermittently. It is working well, no AE's. Has been taking daily at behest of wife. Has not tried a day without taking.   ROS: Psych: As noted in HPI  Past Medical History:  Diagnosis Date  . Anxiety   . Arthritis   . Blind loop syndrome   . Bulging discs    LOWER BACK  . Diverticulosis   . Family history of malignant neoplasm of gastrointestinal tract   . History of adenomatous polyp of colon   . History of Barrett's esophagus   . History of chronic gastritis   . Hyperlipidemia   . IBS (irritable bowel syndrome)   . Malignant neoplasm of prostate [facility]) urologist-  dr dahlstedt/  oncologist-  dr Tammi Klippel   dx 08-09-2017-- Stage T2a, Gleason 4+5, PSA 3.22 (on finateride)--- treatment ADT and external beam radiation  . PONV (postoperative nausea and vomiting)   . Seasonal allergic rhinitis   . Wears glasses     Objective: BP 128/84 (BP Location: Left Arm, Patient Position: Sitting, Cuff Size: Normal)   Pulse 64   Temp 98.5 F (36.9 C) (Oral)   Ht 5\' 11"  (1.803 m)   Wt 176 lb (79.8 kg)   SpO2 96%   BMI 24.55 kg/m  General: Awake, appears stated age Heart: RRR, no LE edema Lungs: CTAB, no rales, wheezes or rhonchi. No accessory muscle use Psych: Age appropriate judgment and insight, normal affect and mood  Assessment and Plan: Anxiety - Plan: Cont Valium, rec'd he not take it every day. It will be there if he needs it. We anticipate his last injection will be in Feb and he may be able to come off of Valium at that time.    F/u in 6 mo The patient voiced understanding and agreement to the plan.  [place], DO 12/27/18  10:19 AM

## 2018-12-27 NOTE — Patient Instructions (Addendum)
See how things go if you don't take the medication (Valium). This is as needed so shouldn't affect anything longer term.    Let us know if you need anything.

## 2019-01-13 ENCOUNTER — Telehealth: Payer: Self-pay | Admitting: Internal Medicine

## 2019-01-13 NOTE — Telephone Encounter (Signed)
Pt reported that he is receiving Lupron inj for prostate cancer, doing 40 radiation treatments.  Pt stated that he is "having a lot of flatulence and leaking liquid."   Pt would like a prescription for anti-gas medication as OTC meds have not helped him.

## 2019-01-13 NOTE — Telephone Encounter (Signed)
Discussed with pt and let him know that there is not a prescription med available for gas, just OTC meds.

## 2019-02-13 ENCOUNTER — Encounter: Payer: Self-pay | Admitting: Family Medicine

## 2019-02-23 ENCOUNTER — Emergency Department (HOSPITAL_BASED_OUTPATIENT_CLINIC_OR_DEPARTMENT_OTHER)
Admission: EM | Admit: 2019-02-23 | Discharge: 2019-02-23 | Disposition: A | Discharge status: Home / Self Care | Payer: Medicare Other | Attending: Emergency Medicine | Admitting: Emergency Medicine

## 2019-02-23 ENCOUNTER — Other Ambulatory Visit: Payer: Self-pay

## 2019-02-23 ENCOUNTER — Encounter (HOSPITAL_BASED_OUTPATIENT_CLINIC_OR_DEPARTMENT_OTHER): Payer: Self-pay

## 2019-02-23 DIAGNOSIS — Z8546 Personal history of malignant neoplasm of prostate: Secondary | ICD-10-CM | POA: Diagnosis not present

## 2019-02-23 DIAGNOSIS — S81012A Laceration without foreign body, left knee, initial encounter: Secondary | ICD-10-CM | POA: Insufficient documentation

## 2019-02-23 DIAGNOSIS — Z79899 Other long term (current) drug therapy: Secondary | ICD-10-CM | POA: Diagnosis not present

## 2019-02-23 DIAGNOSIS — Z23 Encounter for immunization: Secondary | ICD-10-CM | POA: Diagnosis not present

## 2019-02-23 DIAGNOSIS — Y929 Unspecified place or not applicable: Secondary | ICD-10-CM | POA: Insufficient documentation

## 2019-02-23 DIAGNOSIS — W260XXA Contact with knife, initial encounter: Secondary | ICD-10-CM | POA: Diagnosis not present

## 2019-02-23 DIAGNOSIS — Y999 Unspecified external cause status: Secondary | ICD-10-CM | POA: Diagnosis not present

## 2019-02-23 DIAGNOSIS — Y9389 Activity, other specified: Secondary | ICD-10-CM | POA: Insufficient documentation

## 2019-02-23 MED ORDER — LIDOCAINE-EPINEPHRINE (PF) 2 %-1:200000 IJ SOLN
10.0000 mL | Freq: Once | INTRAMUSCULAR | Status: AC
  Administered 2019-02-23: 10 mL
  Filled 2019-02-23 (×2): qty 10

## 2019-02-23 MED ORDER — TETANUS-DIPHTH-ACELL PERTUSSIS 5-2.5-18.5 LF-MCG/0.5 IM SUSP
0.5000 mL | Freq: Once | INTRAMUSCULAR | Status: AC
  Administered 2019-02-23: 0.5 mL via INTRAMUSCULAR
  Filled 2019-02-23: qty 0.5

## 2019-02-23 NOTE — ED Notes (Signed)
Dressing to left knee suture area.

## 2019-02-23 NOTE — ED Provider Notes (Signed)
[facility] EMERGENCY DEPARTMENT Provider Note   CSN: [id number] Arrival date & time: 02/23/19  1521     History   Chief Complaint Chief Complaint  Patient presents with  . Knee Injury    HPI Manuel Landry is a 73 y.o. male.  He is here for evaluation of an left knee laceration after he accidentally cut himself opening a box with a pocket knife.  This occurred about an hour or so ago.  No pain.  Only concern was a continued to bleed and he could not get it stopped by topical methods.  Unclear last tetanus shot.  No distal numbness or weakness.  No other complaints.     The history is provided by the patient.  Laceration Location:  Leg Leg laceration location:  L knee Length:  2 Depth:  Cutaneous Quality: straight   Bleeding: controlled   Time since incident:  1 hour Laceration mechanism:  Knife Pain details:    Severity:  No pain Foreign body present:  No foreign bodies Worsened by:  Movement Ineffective treatments:  Pressure Tetanus status:  Out of date Associated symptoms: no fever, no focal weakness, no numbness, no redness and no streaking     Past Medical History:  Diagnosis Date  . Anxiety   . Arthritis   . Blind loop syndrome   . Bulging discs    LOWER BACK  . Diverticulosis   . Family history of malignant neoplasm of gastrointestinal tract   . History of adenomatous polyp of colon   . History of Barrett's esophagus   . History of chronic gastritis   . Hyperlipidemia   . IBS (irritable bowel syndrome)   . Malignant neoplasm of prostate [facility]) urologist-  dr dahlstedt/  oncologist-  dr Tammi Klippel   dx 08-09-2017-- Stage T2a, Gleason 4+5, PSA 3.22 (on finateride)--- treatment ADT and external beam radiation  . PONV (postoperative nausea and vomiting)   . Seasonal allergic rhinitis   . Wears glasses     Patient Active Problem List   Diagnosis Date Noted  . Anxiety 12/27/2018  . Trigger finger of left thumb 11/17/2018  . Malignant neoplasm  of prostate ([place]) 09/06/2017  . Hyperlipidemia 01/27/2017  . Cardiovascular risk factor 08/12/2011  . S/P cholecystectomy 06/22/2011  . Personal history of colonic polyps 06/22/2011  . CHOLELITHIASIS 02/13/2010  . FLATULENCE-GAS-BLOATING 02/13/2010  . CHOLELITHIASIS 02/11/2010  . GERD 02/10/2010  . DIVERTICULOSIS-COLON 02/10/2010  . NAUSEA ALONE 02/10/2010  . ABDOMINAL PAIN RIGHT UPPER QUADRANT 02/10/2010  . ABDOMINAL PAIN, EPIGASTRIC 02/10/2010  . ANEMIA-IRON DEFICIENCY 07/17/2008  . INGUINAL HERNIA 07/17/2008  . IRRITABLE BOWEL SYNDROME 07/17/2008  . GASTRITIS 07/11/2008  . DUODENITIS 07/11/2008  . RENAL CYST, RIGHT 07/11/2008  . DYSPEPSIA 06/12/2008  . CONSTIPATION 05/31/2008  . BACK PAIN, CHRONIC 05/31/2008  . BENIGN PROSTATIC HYPERTROPHY, MILD, HX OF 05/31/2008    Past Surgical History:  Procedure Laterality Date  . COLONOSCOPY  last one 10-21-2016  dr pyrtle  . CYSTOSCOPY WITH INSERTION OF UROLIFT N/A 12/15/2017   Procedure: CYSTOSCOPY WITH INSERTION OF UROLIFT, BIOPSY  PROSTATIC URETHAL;  Surgeon: Franchot Gallo, MD;  Location: [facility];  Service: Urology;  Laterality: N/A;  . ESOPHAGOGASTRODUODENOSCOPY (EGD) WITH PROPOFOL  last one 09-26-2013   dr pyrtle  . INGUINAL HERNIA REPAIR Bilateral right 10-25-1997:  left 09-28-2008 (dr m. Hassell Done @ [facility])  . LAPAROSCOPIC CHOLECYSTECTOMY  2012  . MANDIBLE SURGERY Bilateral 1990s   removal calcified bone growths  . ROTATOR CUFF  REPAIR Right 03-26-2009   dr Tonita Cong  [facility]  . SHOULDER OPEN ROTATOR CUFF REPAIR  03/17/2012   Procedure: ROTATOR CUFF REPAIR SHOULDER OPEN;  Surgeon: Johnn Hai, MD;  Location: [facility];  Service: Orthopedics;  Laterality: Left;  LEFT MINI OPEN ROTATOR CUFF REPAIR   . SPACE OAR INSTILLATION N/A 12/15/2017   Procedure: SPACE OAR INSTILLATION;  Surgeon: Franchot Gallo, MD;  Location: [facility];  Service: Urology;  Laterality: N/A;  . TONSILLECTOMY  child         Home Medications    Prior to Admission medications   Medication Sig Start Date End Date Taking? Authorizing Provider  azelastine (ASTELIN) 137 MCG/SPRAY nasal spray Place 1 spray into the nose as needed. Pt states his spray is 0.1% and he only uses nasal spray if needed.     [provider]  diazepam (VALIUM) 10 MG tablet Take 1 tablet (10 mg total) by mouth every 12 (twelve) hours as needed for anxiety. 07/07/18   [name], DO  Glucosamine-Chondroitin (MOVE FREE PO) Take 1 tablet by mouth daily.    [provider]  magnesium oxide (MAG-OX) 400 MG tablet Take 400 mg by mouth daily.     [provider]  Multiple Vitamins-Minerals (OCUVITE ADULT 50+) CAPS Take 1 capsule by mouth daily.    [provider]  tadalafil (CIALIS) 5 MG tablet Take 5 mg by mouth every evening.     [provider]  tamsulosin (FLOMAX) 0.4 MG CAPS capsule Take 0.4 mg by mouth every evening.     [provider]  vitamin B-12 (CYANOCOBALAMIN) 1000 MCG tablet Take 1,000 mcg by mouth daily.    [provider]    Family History Family History  Problem Relation Age of Onset  . Colon cancer Mother 92  . Diabetes Maternal Grandmother   . COPD Sister   . Heart failure Father 35  . Esophageal cancer Neg Hx   . Rectal cancer Neg Hx   . Stomach cancer Neg Hx     Social History Social History   Tobacco Use  . Smoking status: Never Smoker  . Smokeless tobacco: Never Used  Substance Use Topics  . Alcohol use: Yes    Comment: occasional  . Drug use: No     Allergies   Patient has no known allergies.   Review of Systems Review of Systems  Constitutional: Negative for fever.  Skin: Positive for wound.  Neurological: Negative for focal weakness.     Physical Exam Updated Vital Signs BP (!) 166/97 (BP Location: Left Arm)   Pulse 95   Temp 98.4 F (36.9 C) (Oral)   Resp 16   Ht 5\' 11"  (1.803 m)   Wt 78.5 kg   SpO2 100%   BMI  24.13 kg/m   Physical Exam Vitals signs and nursing note reviewed.  Constitutional:      Appearance: He is well-developed.  HENT:     Head: Normocephalic and atraumatic.  Eyes:     Conjunctiva/sclera: Conjunctivae normal.  Neck:     Musculoskeletal: Neck supple.  Pulmonary:     Effort: Pulmonary effort is normal.  Musculoskeletal: Normal range of motion.        General: Signs of injury present.     Comments: Proximately 2 cm laceration medial left knee.  Extensor mechanism intact.  Full range of motion without any limitations or pain.  No active bleeding no foreign body sensation.  Skin:  General: Skin is warm and dry.     Capillary Refill: Capillary refill takes less than 2 seconds.  Neurological:     General: No focal deficit present.     Mental Status: He is alert.     GCS: GCS eye subscore is 4. GCS verbal subscore is 5. GCS motor subscore is 6.     Gait: Gait normal.      ED Treatments / Results  Labs (all labs ordered are listed, but only abnormal results are displayed) Labs Reviewed - No data to display  EKG None  Radiology No results found.  Procedures .Marland KitchenLaceration Repair  Date/Time: 02/23/2019 3:58 PM Performed by: Hayden Rasmussen, MD Authorized by: Hayden Rasmussen, MD   Consent:    Consent obtained:  Verbal   Consent given by:  Patient   Risks discussed:  Infection, pain, poor cosmetic result, poor wound healing and retained foreign body   Alternatives discussed:  No treatment and delayed treatment Anesthesia (see MAR for exact dosages):    Anesthesia method:  Local infiltration   Local anesthetic:  Lidocaine 2% WITH epi Laceration details:    Location:  Leg   Leg location:  L knee   Length (cm):  2 Repair type:    Repair type:  Simple Pre-procedure details:    Preparation:  Patient was prepped and draped in usual sterile fashion Treatment:    Area cleansed with:  Betadine Skin repair:    Repair method:  Sutures   Suture size:  4-0    Suture material:  Nylon   Suture technique:  Simple interrupted   Number of sutures:  2 Approximation:    Approximation:  Close Post-procedure details:    Patient tolerance of procedure:  Tolerated well, no immediate complications   (including critical care time)  Medications Ordered in ED Medications  lidocaine-EPINEPHrine (XYLOCAINE W/EPI) 2 %-1:200000 (PF) injection 10 mL (has no administration in time range)  Tdap (BOOSTRIX) injection 0.5 mL (has no administration in time range)     Initial Impression / Assessment and Plan / ED Course  I have reviewed the triage vital signs and the nursing notes.  Pertinent labs & imaging results that were available during my care of the patient were reviewed by me and considered in my medical decision making (see chart for details).         Final Clinical Impressions(s) / ED Diagnoses   Final diagnoses:  Laceration of left knee, initial encounter    ED Discharge Orders    None       Hayden Rasmussen, MD 02/23/19 [phone]

## 2019-02-23 NOTE — ED Triage Notes (Signed)
Pt states he cut left knee with a knife opening a box ~1 hour PTA-NAD-steady gait

## 2019-02-23 NOTE — Discharge Instructions (Addendum)
You were seen in the emergency department for a left knee laceration.  Your tetanus was updated and your wound was sutured closed.  Sutures will need to be removed in 10 to 12 days.  Please return sooner if any signs of infection.

## 2019-02-28 ENCOUNTER — Other Ambulatory Visit: Payer: Self-pay | Admitting: Family Medicine

## 2019-02-28 NOTE — Progress Notes (Unsigned)
Counseling Intern Notes:  Met with the patient on 02/24/2019 via video for a counseling session.  The previous session the patient reported experiencing difficulty with emotional regulation and stated he would attempt to use awareness to notice when he is beginning to have difficulty controlling an emotion. This session the patient reported that he had a better week; he felt he was able to control his emotions, not letting one difficult moment ruin his entire day. The patient recognized that he had successfully navigated a difficult situation and that he can use this success to remind himself that he can have control over his emotions in the future. Session focused on learning additional emotional regulation skills to help him continue to experience increased control over his emotions. Counselor explained the DBT concept of the wise mind to recognize and respect feelings, while responding to them in a rational manner. Patient stated he will continue to integrate new skills for emotional regulation during the next week. Additionally, the  patient stated his favorite activities include swimming, tennis, target shooting, watching [employer] cow[employer] videos, and photography. We discussed intentionally scheduling these activities on a regular basis to infuse more positive thinking and relaxing moments throughout his week as a coping strategy for reducing negative thinking and distress.  Next Scheduled Counseling Appt: Friday, October 16 at 2:00pm via [email or web address] (video)  [name] Counseling Intern Voicemail:  [phone]

## 2019-02-28 NOTE — Progress Notes (Signed)
Counseling Intern Notes:  Met with the patient via video on 02/24/2019.  During the previous session the patient reported difficulty with emotional regulation and agreed to begin to notice when he is experiencing intense negative emotions and negative thinking patterns. This session the patient reported having a better week; that he experienced an increase in his ability to control his emotions. Patient recognized that his success can serve as a positive reminder that he can maintain control of his emotions in difficult situations. The counselor continued introducing skills for emotional regulation including the DBT skill of the wise mind, recognizing and respecting feelings, while responding to them in a rational manner. Additionally, the patient stated he will integrate relaxing activities of his choosing into his week to promote a positive mindset and to decrease negative thinking. The patient stated he will attempt to implement the emotional regulation skills discussed when he notices a negative emotional reaction is beginning.      Next Counseling Appt:  03/03/2019 at 2:00pm via video  [place] Counseling Intern Voicemail:  [phone]

## 2019-03-03 NOTE — Progress Notes (Signed)
Counseling Intern Notes:  Counselor spoke with pt via video today for counseling appt. Pt was calm and presented with a positive demeanor. Counselor provided therapeutic listening as the pt reflected on his past week. Pt reported he is doing much better with controlling his emotional reactions to situations that he finds upsetting or distressing. Pt reported that he utilized the emotional regulation strategies and ideas discussed during the previous session and specifically mentioned his use of prayer as a strategy to remain in control of his reactions. Counselor encouraged pt to continue working on implementing these strategies and the pt was agreeable to this.    Next Counseling Appt:  Friday, March 10, 2019 at 1:00pm via video  [place] Counseling Intern Voicemail:  [phone]

## 2019-03-06 ENCOUNTER — Other Ambulatory Visit: Payer: Self-pay

## 2019-03-07 ENCOUNTER — Other Ambulatory Visit: Payer: Self-pay

## 2019-03-07 ENCOUNTER — Ambulatory Visit (INDEPENDENT_AMBULATORY_CARE_PROVIDER_SITE_OTHER): Payer: Medicare Other | Admitting: Family Medicine

## 2019-03-07 ENCOUNTER — Encounter: Payer: Self-pay | Admitting: Family Medicine

## 2019-03-07 VITALS — BP 120/84 | HR 99 | Temp 97.7°F | Ht 71.0 in | Wt 180.5 lb

## 2019-03-07 DIAGNOSIS — S71112A Laceration without foreign body, left thigh, initial encounter: Secondary | ICD-10-CM

## 2019-03-07 DIAGNOSIS — H6982 Other specified disorders of Eustachian tube, left ear: Secondary | ICD-10-CM

## 2019-03-07 MED ORDER — FLUTICASONE PROPIONATE 50 MCG/ACT NA SUSP: 2.0000 | Freq: Every day | NASAL | 6 refills | Status: DC

## 2019-03-07 NOTE — Patient Instructions (Signed)
Your skin looks great. No need to do anything else for the cut.  Double up on your Flonase for the next week. Continue daily to help with the ear. Let me know if there are issues.  Let us know if you need anything.

## 2019-03-07 NOTE — Progress Notes (Signed)
Chief Complaint  Patient presents with  . Suture / Staple Removal    Subjective: Patient is a 74 y.o. male here for suture removal.  Here for suture removal. Tdap updated in ER. Cut on box knife. Doing well overall.  Fullness in L ear and some ringing. No drainage or pain. Hearing is normal.   ROS: Const: no fevers  Past Medical History:  Diagnosis Date  . Anxiety   . Arthritis   . Blind loop syndrome   . Bulging discs    LOWER BACK  . Diverticulosis   . Family history of malignant neoplasm of gastrointestinal tract   . History of adenomatous polyp of colon   . History of Barrett's esophagus   . History of chronic gastritis   . Hyperlipidemia   . IBS (irritable bowel syndrome)   . Malignant neoplasm of prostate [facility]) urologist-  dr dahlstedt/  oncologist-  dr Tammi Klippel   dx 08-09-2017-- Stage T2a, Gleason 4+5, PSA 3.22 (on finateride)--- treatment ADT and external beam radiation  . PONV (postoperative nausea and vomiting)   . Seasonal allergic rhinitis   . Wears glasses     Objective: BP 120/84 (BP Location: Left Arm, Patient Position: Sitting, Cuff Size: Normal)   Pulse 99   Temp 97.7 F (36.5 C) (Temporal)   Ht 5\' 11"  (1.803 m)   Wt 180 lb 8 oz (81.9 kg)   SpO2 94%   BMI 25.17 kg/m  General: Awake, appears stated age HEENT: Canals patent, TM slightly retracted on L; R side is neg Skin: Well healed and well approximated laceration on L distal thigh.  Lungs: No accessory muscle use Psych: Age appropriate judgment and insight, normal affect and mood  Assessment and Plan: Laceration of left thigh, initial encounter  Dysfunction of left eustachian tube  1- 2 simple sutures successfully removed.  2- INCS, let me knokw if there are issues. F/u as originally scheduled.  The patient voiced understanding and agreement to the plan.  [place], DO 03/07/19  10:34 AM

## 2019-03-08 DIAGNOSIS — C61 Malignant neoplasm of prostate: Secondary | ICD-10-CM | POA: Diagnosis not present

## 2019-03-10 NOTE — Progress Notes (Signed)
Counseling Intern Notes  Spoke to pt by video ([email or web address]) for a counseling appt on 03/10/2019. Counselor provided therapeutic listening and empathy as pt reflected on his week. Pt reported that a member of his wife's extended famly passed away in [place] and they chose not to attend the funeral or family gatherings because of the risk of Covid-19 exposure. Pt is anticipating his scheduled infusion next week and reported frustration that his wife still can not accompany him into the appt. Pt also reported continued success with emotional regulation using the strategies discussed in previous sessions. Pt demonstrated insight by recognizing his long history of success with emotional regulation (sharing examples from his early 20s up until last few years). Pt shared that he is looking forward to his plans for this evening. Pt recognized that he is meeting his goals for counseling and suggested we begin meeting every two weeks after the next session. Counselor agreed with this plan.  Next Counseling Appt:  03/17/2019 at 2:00pm via video    [place] Counseling Intern Voicemail:  [phone]

## 2019-03-13 DIAGNOSIS — N401 Enlarged prostate with lower urinary tract symptoms: Secondary | ICD-10-CM | POA: Diagnosis not present

## 2019-03-13 DIAGNOSIS — C61 Malignant neoplasm of prostate: Secondary | ICD-10-CM | POA: Diagnosis not present

## 2019-03-13 DIAGNOSIS — R351 Nocturia: Secondary | ICD-10-CM | POA: Diagnosis not present

## 2019-03-17 ENCOUNTER — Encounter: Payer: Self-pay | Admitting: Family Medicine

## 2019-03-17 NOTE — Progress Notes (Signed)
Counseling Intern Notes: Spoke to patient on 03/07/2019 via video for a counseling appointment.  Pt stated he is "tickled" by his oncology results and feels hopeful that his cancer treatment is nearly complete.  Pt reported that he is currently experiencing back pain and continued anal leakage. Pt stated that the anal leakage is "embarrassing" and causes some distress.  However, pt reports he is still going on occasional outings.  He stated that he went on his weekly outing today with his pastor.  Pt continues to report success with emotional regulation. Counseling intern noted that the pt is self-aware, motivated to grow, and is adept at cognitive reframing.  As previously discussed pt is moving to an every 2 weeks schedule for counseling appointments.    Next Counseling Appt: 03/31/2019 at 2:00pm via video  [place] Counseling Intern Voicemail:  [phone]

## 2019-03-21 ENCOUNTER — Encounter: Payer: Self-pay | Admitting: Family Medicine

## 2019-03-22 ENCOUNTER — Other Ambulatory Visit: Payer: Self-pay | Admitting: Family Medicine

## 2019-03-22 DIAGNOSIS — M549 Dorsalgia, unspecified: Secondary | ICD-10-CM

## 2019-03-30 ENCOUNTER — Ambulatory Visit: Payer: Medicare Other | Attending: Family Medicine | Admitting: Physical Therapy

## 2019-03-30 ENCOUNTER — Other Ambulatory Visit: Payer: Self-pay

## 2019-03-30 ENCOUNTER — Encounter: Payer: Self-pay | Admitting: Physical Therapy

## 2019-03-30 DIAGNOSIS — R293 Abnormal posture: Secondary | ICD-10-CM | POA: Insufficient documentation

## 2019-03-30 DIAGNOSIS — M545 Low back pain, unspecified: Secondary | ICD-10-CM

## 2019-03-30 DIAGNOSIS — M62830 Muscle spasm of back: Secondary | ICD-10-CM | POA: Insufficient documentation

## 2019-03-30 DIAGNOSIS — R29898 Other symptoms and signs involving the musculoskeletal system: Secondary | ICD-10-CM | POA: Insufficient documentation

## 2019-03-30 NOTE — Therapy (Signed)
[facility] [address]  [place], [place], [address] Phone: [phone]   Fax:  [phone]  Physical Therapy Evaluation  Patient Details  Name: Manuel Landry MRN: [id number] Date of Birth: February 16, 1946 Referring Provider (PT): Riki Sheer, [place]   Encounter Date: 03/30/2019  PT End of Session - 03/30/19 0936    Visit Number  1    Number of Visits  6    Date for PT Re-Evaluation  05/11/19    Authorization Type  Medicare & BCBS    PT Start Time  [phone]    PT Stop Time  1037    PT Time Calculation (min)  61 min    Activity Tolerance  Patient tolerated treatment well    Behavior During Therapy  [facility] for tasks assessed/performed       Past Medical History:  Diagnosis Date  . Anxiety   . Arthritis   . Blind loop syndrome   . Bulging discs    LOWER BACK  . Diverticulosis   . Family history of malignant neoplasm of gastrointestinal tract   . History of adenomatous polyp of colon   . History of Barrett's esophagus   . History of chronic gastritis   . Hyperlipidemia   . IBS (irritable bowel syndrome)   . Malignant neoplasm of prostate [facility]) urologist-  dr dahlstedt/  oncologist-  dr Tammi Klippel   dx 08-09-2017-- Stage T2a, Gleason 4+5, PSA 3.22 (on finateride)--- treatment ADT and external beam radiation  . PONV (postoperative nausea and vomiting)   . Seasonal allergic rhinitis   . Wears glasses     Past Surgical History:  Procedure Laterality Date  . COLONOSCOPY  last one 10-21-2016  dr pyrtle  . CYSTOSCOPY WITH INSERTION OF UROLIFT N/A 12/15/2017   Procedure: CYSTOSCOPY WITH INSERTION OF UROLIFT, BIOPSY  PROSTATIC URETHAL;  Surgeon: Franchot Gallo, MD;  Location: [facility];  Service: Urology;  Laterality: N/A;  . ESOPHAGOGASTRODUODENOSCOPY (EGD) WITH PROPOFOL  last one 09-26-2013   dr pyrtle  . INGUINAL HERNIA REPAIR Bilateral right 10-25-1997:  left 09-28-2008 (dr m. Hassell Done @ [facility])  .  LAPAROSCOPIC CHOLECYSTECTOMY  2012  . MANDIBLE SURGERY Bilateral 1990s   removal calcified bone growths  . ROTATOR CUFF REPAIR Right 03-26-2009   dr Tonita Cong  [facility]  . SHOULDER OPEN ROTATOR CUFF REPAIR  03/17/2012   Procedure: ROTATOR CUFF REPAIR SHOULDER OPEN;  Surgeon: Johnn Hai, MD;  Location: [facility];  Service: Orthopedics;  Laterality: Left;  LEFT MINI OPEN ROTATOR CUFF REPAIR   . SPACE OAR INSTILLATION N/A 12/15/2017   Procedure: SPACE OAR INSTILLATION;  Surgeon: Franchot Gallo, MD;  Location: [facility];  Service: Urology;  Laterality: N/A;  . TONSILLECTOMY  child    There were no vitals filed for this visit.   Subjective Assessment - 03/30/19 0940    Subjective  Pt reporting he was lifting a wide storage box from the floor to an overhead shelf while rotating his torso when he felt a strain in R low back. Later he was rising up from the toilet when he felt a snap near the R SIJ. Has been managing pain with meds and ice/heat for past 3 weeks. Does not impede his stretching exercises in the morning but has difficulty bending over and sit to stand transitions. Reports he wears a "stretchy" LSO when working in the yard.    Pertinent History  chronic LBP since the 90's  Limitations  Lifting;House hold activities    Diagnostic tests  no recent imaging    Patient Stated Goals  "to have some DN to loosen up the muscles"    Currently in Pain?  Yes    Pain Score  3    up to nearly 10/10 first thing this morning   Pain Location  Back   lateral to SIJ   Pain Orientation  Right    Pain Descriptors / Indicators  Sharp    Pain Type  Acute pain;Chronic pain    Pain Radiating Towards  n/a    Pain Onset  1 to 4 weeks ago    Pain Frequency  Intermittent    Aggravating Factors   bending forward, sit to stand transitions    Pain Relieving Factors  ibuprofen, voltaren topical, ice pack, hot shower    Effect of Pain on Daily Activities  variable - typically "just put up with  the pain"         [facility] PT Assessment - 03/30/19 0936      Assessment   Medical Diagnosis  Acute on chronic R LBP    Referring Provider (PT)  Riki Sheer, DO    Onset Date/Surgical Date  --   3 weeks ago   Next MD Visit  06/27/19    Prior Therapy  PT in 2018 & 2019 for LBP/glute strain      Precautions   Precautions  Other (comment)    Precaution Comments  active treatment for prostate cancer      Restrictions   Weight Bearing Restrictions  No      Balance Screen   Has the patient fallen in the past 6 months  No    Has the patient had a decrease in activity level because of a fear of falling?   No    Is the patient reluctant to leave their home because of a fear of falling?   No      Home Environment   Living Environment  Private residence    Living Arrangements  Spouse/significant other    Type of Home  Independent living facility   [facility] at [place]  One level      Prior Function   Level of [place]  Retired    Leisure  walking the dog, working in the yard, daily 20 minutes of stretching/exercises in the am      Cognition   Overall Cognitive Status  Within Functional Limits for tasks assessed      Observation/Other Assessments   Focus on Therapeutic Outcomes (FOTO)   Lumbar - 79% (21% limitation); Predicted 85% (15% limitation)      ROM / Strength   AROM / PROM / Strength  AROM;Strength      AROM   AROM Assessment Site  Lumbar    Lumbar Flexion  hands to mid shins - pain upon return to stand    Lumbar Extension  25% limited    Lumbar - Right Side Bend  hand to lateral knee - pain    Lumbar - Left Side Bend  hand to lateral knee    Lumbar - Right Rotation  WFL    Lumbar - Left Rotation  [facility]      Strength   Strength Assessment Site  Hip;Knee    Right/Left Hip  Right;Left    Right Hip Flexion  4+/5  Right Hip Extension  4/5    Right Hip External Rotation   4/5    Right Hip  Internal Rotation  4/5    Right Hip ABduction  4/5    Right Hip ADduction  4/5    Left Hip Flexion  4-/5   R hip/LBP   Left Hip Extension  4/5    Left Hip External Rotation  4/5   R hip/LBP   Left Hip Internal Rotation  4/5    Left Hip ABduction  4+/5    Left Hip ADduction  4/5    Right/Left Knee  Right;Left    Right Knee Flexion  5/5   R hip/LBP   Right Knee Extension  5/5    Left Knee Flexion  5/5    Left Knee Extension  5/5      Flexibility   Soft Tissue Assessment /Muscle Length  yes    Hamstrings  mild tight B    Quadriceps  mod/severe tight B hip flexors and quads    ITB  mod tight B    Piriformis  mild/mod tight B      Palpation   Palpation comment  ttp over R upper medial glutes                Objective measurements completed on examination: See above findings.      [facility] Adult PT Treatment/Exercise - 03/30/19 0936      Posture/Postural Control   Posture/Postural Control  Postural limitations    Postural Limitations  Decreased lumbar lordosis;Posterior pelvic tilt      Self-Care   Self-Care  Other Self-Care Comments    Other Self-Care Comments   Brief verbal review of current HEP and self-ball release to R glute on wall      Exercises   Exercises  Lumbar      Lumbar Exercises: Stretches   Hip Flexor Stretch Limitations  verbal instruction in prone RF/hip flexor stretch with strap    Piriformis Stretch  Right;Left;30 seconds;1 rep    Piriformis Stretch Limitations  KTOS    Figure 4 Stretch  30 seconds;1 rep;Supine;With overpressure    Figure 4 Stretch Limitations  figure 4 to chest      Manual Therapy   Manual Therapy  Soft tissue mobilization;Myofascial release    Manual therapy comments  prone    Soft tissue mobilization  R upper medial glutes    Myofascial Release  manual TPR to R glute medius       Trigger Point Dry Needling - 03/30/19 0936    Consent Given?  Yes    Education Handout Provided  Previously provided    Muscles Treated  Back/Hip  Gluteus medius    Gluteus Medius Response  Twitch response elicited;Palpable increased muscle length   Right          PT Education - 03/30/19 1037    Education Details  PT eval findings, anticipated POC; review of current HEP and self-STM technique; review of role of DN, expected response to treatment and post-treatment exercise/activity level    Person(s) Educated  Patient    Methods  Explanation    Comprehension  Verbalized understanding;Need further instruction          PT Long Term Goals - 03/30/19 1037      PT LONG TERM GOAL #1   Title  Patient will be independent with ongoing/advanced HEP    Status  New    Target Date  05/11/19  PT LONG TERM GOAL #2   Title  Patient to report ability put on socks independently without increased pain    Status  New    Target Date  05/11/19      PT LONG TERM GOAL #3   Title  Patient to report a pain score at worst in the low back/buttock of no greater than 2/10    Status  New    Target Date  05/11/19      PT LONG TERM GOAL #4   Title  Patient to demonstrate improved tissue quality with reduced TTP and symmetrical presentation    Status  New    Target Date  05/11/19             Plan - 03/30/19 1037    Clinical Impression Statement  Manuel Landry is a 73 y/o male who presents to OP PT with acute exacerbation of chronic LBP after lifting a heavy storage tote onto an overhead shelf. Pain localized to R upper buttock just lateral of SIJ with ttp over R glute medius. Pain has not prevented him from completing his ongoing low back HEP of lumbopelvic stretches and exercises, but these exercise as well as use of thermal modalities as not resolved his pain with greatest restrictions noted during sit to stand transfers and forward bending. Deficits noted with lumbar AROM into flexion and extension to a lesser degree with pain upon return to standing from flexed position as well as with R side bend. Proximal hip weakness, and reduced  proximal flexibility also likely contributing to overall pain patterns. Patient requesting to focus on manual therapy including soft tissue mobilization and dry needling as he has had positive response with this in prior PT episodes but will also plan to review and update existing HEP as indicated. Given ongoing active cancer in local proximity to low back, will defer [place] and estim modalities. Manual therapy including DN initiated as part of eval visit with patient noting benefit by end of session.    Personal Factors and Comorbidities  Comorbidity 3+;Age;Past/Current Experience    Comorbidities  chronic LBP x 20 yrs; active prostate cancer; B RTC repair; GERD,IBS, gastritis    Examination-Activity Limitations  Bend;Dressing;Toileting;Transfers;Lift    Examination-Participation Restrictions  Yard Work    [employer]  Evolving/Moderate complexity    Clinical Decision Making  Moderate    Rehab Potential  Good    PT Frequency  1x / week    PT Duration  6 weeks    PT Treatment/Interventions  ADLs/Self Care Home Management;Cryotherapy;Moist Heat;Functional mobility training;Therapeutic activities;Therapeutic exercise;Neuromuscular re-education;Patient/family education;Manual techniques;Dry needling;Taping;Spinal Manipulations;Joint Manipulations    PT Next Visit Plan  Review and update existing HEP as indicated; manual therapy including DN as indicated    Consulted and Agree with Plan of Care  Patient       Patient will benefit from skilled therapeutic intervention in order to improve the following deficits and impairments:  Decreased activity tolerance, Decreased mobility, Decreased range of motion, Decreased strength, Hypomobility, Increased fascial restricitons, Increased muscle spasms, Impaired flexibility, Improper body mechanics, Postural dysfunction, Pain  Visit Diagnosis: Acute right-sided low back pain without sciatica  Muscle spasm of back  Other symptoms and signs  involving the musculoskeletal system  Abnormal posture     Problem List Patient Active Problem List   Diagnosis Date Noted  . Anxiety 12/27/2018  . Trigger finger of left thumb 11/17/2018  . Malignant neoplasm of prostate ([place]) 09/06/2017  . Hyperlipidemia 01/27/2017  . Cardiovascular  risk factor 08/12/2011  . S/P cholecystectomy 06/22/2011  . Personal history of colonic polyps 06/22/2011  . CHOLELITHIASIS 02/13/2010  . FLATULENCE-GAS-BLOATING 02/13/2010  . CHOLELITHIASIS 02/11/2010  . GERD 02/10/2010  . DIVERTICULOSIS-COLON 02/10/2010  . NAUSEA ALONE 02/10/2010  . ABDOMINAL PAIN RIGHT UPPER QUADRANT 02/10/2010  . ABDOMINAL PAIN, EPIGASTRIC 02/10/2010  . ANEMIA-IRON DEFICIENCY 07/17/2008  . INGUINAL HERNIA 07/17/2008  . IRRITABLE BOWEL SYNDROME 07/17/2008  . GASTRITIS 07/11/2008  . DUODENITIS 07/11/2008  . RENAL CYST, RIGHT 07/11/2008  . DYSPEPSIA 06/12/2008  . CONSTIPATION 05/31/2008  . BACK PAIN, CHRONIC 05/31/2008  . BENIGN PROSTATIC HYPERTROPHY, MILD, HX OF 05/31/2008    Percival Spanish, PT, MPT 03/30/2019, 2:45 PM  [facility] [place], [place], [address] Phone: [phone]   Fax:  [phone]  Name: Manuel Landry MRN: [id number] Date of Birth: 1945-08-29

## 2019-04-04 ENCOUNTER — Ambulatory Visit: Payer: Medicare Other | Admitting: Physical Therapy

## 2019-04-04 ENCOUNTER — Other Ambulatory Visit: Payer: Self-pay

## 2019-04-04 ENCOUNTER — Encounter: Payer: Self-pay | Admitting: Physical Therapy

## 2019-04-04 DIAGNOSIS — M545 Low back pain, unspecified: Secondary | ICD-10-CM

## 2019-04-04 DIAGNOSIS — M62830 Muscle spasm of back: Secondary | ICD-10-CM

## 2019-04-04 DIAGNOSIS — R29898 Other symptoms and signs involving the musculoskeletal system: Secondary | ICD-10-CM | POA: Diagnosis not present

## 2019-04-04 DIAGNOSIS — R293 Abnormal posture: Secondary | ICD-10-CM | POA: Diagnosis not present

## 2019-04-04 NOTE — Therapy (Signed)
[facility] [address]  [place], [place], [address] Phone: [phone]   Fax:  [phone]  Physical Therapy Treatment  Patient Details  Name: Manuel Landry MRN: [id number] Date of Birth: 1945-07-08 Referring Provider (PT): Riki Sheer, DO   Encounter Date: 04/04/2019  PT End of Session - 04/04/19 0932    Visit Number  2    Number of Visits  6    Date for PT Re-Evaluation  05/11/19    Authorization Type  Medicare & BCBS    PT Start Time  0932    PT Stop Time  1013    PT Time Calculation (min)  41 min    Activity Tolerance  Patient tolerated treatment well    Behavior During Therapy  [facility] for tasks assessed/performed       Past Medical History:  Diagnosis Date  . Anxiety   . Arthritis   . Blind loop syndrome   . Bulging discs    LOWER BACK  . Diverticulosis   . Family history of malignant neoplasm of gastrointestinal tract   . History of adenomatous polyp of colon   . History of Barrett's esophagus   . History of chronic gastritis   . Hyperlipidemia   . IBS (irritable bowel syndrome)   . Malignant neoplasm of prostate [facility]) urologist-  dr dahlstedt/  oncologist-  dr Tammi Klippel   dx 08-09-2017-- Stage T2a, Gleason 4+5, PSA 3.22 (on finateride)--- treatment ADT and external beam radiation  . PONV (postoperative nausea and vomiting)   . Seasonal allergic rhinitis   . Wears glasses     Past Surgical History:  Procedure Laterality Date  . COLONOSCOPY  last one 10-21-2016  dr pyrtle  . CYSTOSCOPY WITH INSERTION OF UROLIFT N/A 12/15/2017   Procedure: CYSTOSCOPY WITH INSERTION OF UROLIFT, BIOPSY  PROSTATIC URETHAL;  Surgeon: Franchot Gallo, MD;  Location: [facility];  Service: Urology;  Laterality: N/A;  . ESOPHAGOGASTRODUODENOSCOPY (EGD) WITH PROPOFOL  last one 09-26-2013   dr pyrtle  . INGUINAL HERNIA REPAIR Bilateral right 10-25-1997:  left 09-28-2008 (dr m. Hassell Done @ [facility])  .  LAPAROSCOPIC CHOLECYSTECTOMY  2012  . MANDIBLE SURGERY Bilateral 1990s   removal calcified bone growths  . ROTATOR CUFF REPAIR Right 03-26-2009   dr Tonita Cong  [facility]  . SHOULDER OPEN ROTATOR CUFF REPAIR  03/17/2012   Procedure: ROTATOR CUFF REPAIR SHOULDER OPEN;  Surgeon: Johnn Hai, MD;  Location: [facility];  Service: Orthopedics;  Laterality: Left;  LEFT MINI OPEN ROTATOR CUFF REPAIR   . SPACE OAR INSTILLATION N/A 12/15/2017   Procedure: SPACE OAR INSTILLATION;  Surgeon: Franchot Gallo, MD;  Location: [facility];  Service: Urology;  Laterality: N/A;  . TONSILLECTOMY  child    There were no vitals filed for this visit.  Subjective Assessment - 04/04/19 0934    Subjective  Pt reporting relief from DN lasting a few days. Notes increased tightness after working in the yard yesterday.    Patient Stated Goals  "to have some DN to loosen up the muscles"    Currently in Pain?  Yes    Pain Score  6    10/10 upon rising morning   Pain Location  Back    Pain Orientation  Right    Pain Descriptors / Indicators  Sharp    Pain Type  Acute pain;Chronic pain    Pain Frequency  Intermittent  [facility] Adult PT Treatment/Exercise - 04/04/19 0932      Exercises   Exercises  Lumbar      Lumbar Exercises: Stretches   Hip Flexor Stretch  Right;30 seconds;2 reps    Hip Flexor Stretch Limitations  prone RF/hip flexor stretch with strap & seated lunge position on edge of chair    Quad Stretch  Right;30 seconds;2 reps    Quad Stretch Limitations  prone with strap      Lumbar Exercises: Aerobic   Recumbent Bike  L1 x 6 min      Manual Therapy   Manual Therapy  Soft tissue mobilization;Myofascial release    Manual therapy comments  prone    Soft tissue mobilization  R upper medial glutes & medial piriformis    Myofascial Release  manual TPR to R glute medius/minimus & medial piriformis       Trigger Point Dry Needling - 04/04/19 0932    Consent  Given?  Yes    Muscles Treated Back/Hip  Gluteus medius;Gluteus minimus;Gluteus maximus;Piriformis   Rt   Gluteus Minimus Response  Twitch response elicited;Palpable increased muscle length    Gluteus Medius Response  Twitch response elicited;Palpable increased muscle length    Gluteus Maximus Response  Twitch response elicited;Palpable increased muscle length    Piriformis Response  Twitch response elicited;Palpable increased muscle length                PT Long Term Goals - 04/04/19 0938      PT LONG TERM GOAL #1   Title  Patient will be independent with ongoing/advanced HEP    Status  On-going    Target Date  05/11/19      PT LONG TERM GOAL #2   Title  Patient to report ability put on socks independently without increased pain    Status  On-going    Target Date  05/11/19      PT LONG TERM GOAL #3   Title  Patient to report a pain score at worst in the low back/buttock of no greater than 2/10    Status  On-going    Target Date  05/11/19      PT LONG TERM GOAL #4   Title  Patient to demonstrate improved tissue quality with reduced TTP and symmetrical presentation    Status  On-going    Target Date  05/11/19            Plan - 04/04/19 [id number]    Clinical Impression Statement  [name] reporting some difficulty placing strap for prone quad and hip flexor stretches, therefore instructed him on how to create a loop around his ankle to keep strap secure while getting into position. Upon review of RF/hip flexor stretch, he reports minimal stretch felt in hip flexors with stretch predominantly felt in quads - discussed alternative positioning and/or version of this stretch but patient reporting he does not have a chair w/o armrests for seated lunge version and the edge of his bed is too soft to attempt mod Thomas position. Remainder of session focused on further manual therapy incorporating DN to R glutes and piriformis with good twitch response elicited resulting in palpable  reduction in muscle tension and ttp.    Personal Factors and Comorbidities  Comorbidity 3+;Age;Past/Current Experience    Comorbidities  chronic LBP x 20 yrs; active prostate cancer; B RTC repair; GERD,IBS, gastritis    Rehab Potential  Good    PT Frequency  1x / week    PT  Duration  6 weeks    PT Treatment/Interventions  ADLs/Self Care Home Management;Cryotherapy;Moist Heat;Functional mobility training;Therapeutic activities;Therapeutic exercise;Neuromuscular re-education;Patient/family education;Manual techniques;Dry needling;Taping;Spinal Manipulations;Joint Manipulations    PT Next Visit Plan  Review and update existing HEP as indicated; manual therapy including DN as indicated    Consulted and Agree with Plan of Care  Patient       Patient will benefit from skilled therapeutic intervention in order to improve the following deficits and impairments:  Decreased activity tolerance, Decreased mobility, Decreased range of motion, Decreased strength, Hypomobility, Increased fascial restricitons, Increased muscle spasms, Impaired flexibility, Improper body mechanics, Postural dysfunction, Pain  Visit Diagnosis: Acute right-sided low back pain without sciatica  Muscle spasm of back  Other symptoms and signs involving the musculoskeletal system  Abnormal posture     Problem List Patient Active Problem List   Diagnosis Date Noted  . Anxiety 12/27/2018  . Trigger finger of left thumb 11/17/2018  . Malignant neoplasm of prostate ([place]) 09/06/2017  . Hyperlipidemia 01/27/2017  . Cardiovascular risk factor 08/12/2011  . S/P cholecystectomy 06/22/2011  . Personal history of colonic polyps 06/22/2011  . CHOLELITHIASIS 02/13/2010  . FLATULENCE-GAS-BLOATING 02/13/2010  . CHOLELITHIASIS 02/11/2010  . GERD 02/10/2010  . DIVERTICULOSIS-COLON 02/10/2010  . NAUSEA ALONE 02/10/2010  . ABDOMINAL PAIN RIGHT UPPER QUADRANT 02/10/2010  . ABDOMINAL PAIN, EPIGASTRIC 02/10/2010  . ANEMIA-IRON  DEFICIENCY 07/17/2008  . INGUINAL HERNIA 07/17/2008  . IRRITABLE BOWEL SYNDROME 07/17/2008  . GASTRITIS 07/11/2008  . DUODENITIS 07/11/2008  . RENAL CYST, RIGHT 07/11/2008  . DYSPEPSIA 06/12/2008  . CONSTIPATION 05/31/2008  . BACK PAIN, CHRONIC 05/31/2008  . BENIGN PROSTATIC HYPERTROPHY, MILD, HX OF 05/31/2008    Percival Spanish, PT, MPT 04/04/2019, 12:28 PM  [facility] [address]  [place], [place], [address] Phone: [phone]   Fax:  [phone]  Name: ITAY AVELLINO MRN: [id number] Date of Birth: 02-13-1946

## 2019-04-11 ENCOUNTER — Encounter: Payer: Self-pay | Admitting: Family Medicine

## 2019-04-11 ENCOUNTER — Other Ambulatory Visit: Payer: Self-pay

## 2019-04-11 ENCOUNTER — Ambulatory Visit: Payer: Medicare Other | Admitting: Physical Therapy

## 2019-04-11 ENCOUNTER — Encounter: Payer: Self-pay | Admitting: Physical Therapy

## 2019-04-11 DIAGNOSIS — M62830 Muscle spasm of back: Secondary | ICD-10-CM | POA: Diagnosis not present

## 2019-04-11 DIAGNOSIS — M545 Low back pain, unspecified: Secondary | ICD-10-CM

## 2019-04-11 DIAGNOSIS — R293 Abnormal posture: Secondary | ICD-10-CM | POA: Diagnosis not present

## 2019-04-11 DIAGNOSIS — R29898 Other symptoms and signs involving the musculoskeletal system: Secondary | ICD-10-CM

## 2019-04-11 NOTE — Therapy (Addendum)
[facility] [address]  [place], [place], [address] Phone: [phone]   Fax:  [phone]  Physical Therapy Treatment / Discharge Summary  Patient Details  Name: Manuel Landry MRN: [id number] Date of Birth: 1945-06-01 Referring Provider (PT): Riki Sheer, DO   Encounter Date: 04/11/2019  PT End of Session - 04/11/19 1443    Visit Number  3    Number of Visits  6    Date for PT Re-Evaluation  05/11/19    Authorization Type  Medicare & BCBS    PT Start Time  9794    PT Stop Time  1522    PT Time Calculation (min)  39 min    Activity Tolerance  Patient tolerated treatment well    Behavior During Therapy  [facility] for tasks assessed/performed       Past Medical History:  Diagnosis Date  . Anxiety   . Arthritis   . Blind loop syndrome   . Bulging discs    LOWER BACK  . Diverticulosis   . Family history of malignant neoplasm of gastrointestinal tract   . History of adenomatous polyp of colon   . History of Barrett's esophagus   . History of chronic gastritis   . Hyperlipidemia   . IBS (irritable bowel syndrome)   . Malignant neoplasm of prostate [facility]) urologist-  dr dahlstedt/  oncologist-  dr Tammi Klippel   dx 08-09-2017-- Stage T2a, Gleason 4+5, PSA 3.22 (on finateride)--- treatment ADT and external beam radiation  . PONV (postoperative nausea and vomiting)   . Seasonal allergic rhinitis   . Wears glasses     Past Surgical History:  Procedure Laterality Date  . COLONOSCOPY  last one 10-21-2016  dr pyrtle  . CYSTOSCOPY WITH INSERTION OF UROLIFT N/A 12/15/2017   Procedure: CYSTOSCOPY WITH INSERTION OF UROLIFT, BIOPSY  PROSTATIC URETHAL;  Surgeon: Franchot Gallo, MD;  Location: [facility];  Service: Urology;  Laterality: N/A;  . ESOPHAGOGASTRODUODENOSCOPY (EGD) WITH PROPOFOL  last one 09-26-2013   dr pyrtle  . INGUINAL HERNIA REPAIR Bilateral right 10-25-1997:  left 09-28-2008 (dr m.  Hassell Done @ [facility])  . LAPAROSCOPIC CHOLECYSTECTOMY  2012  . MANDIBLE SURGERY Bilateral 1990s   removal calcified bone growths  . ROTATOR CUFF REPAIR Right 03-26-2009   dr Tonita Cong  [facility]  . SHOULDER OPEN ROTATOR CUFF REPAIR  03/17/2012   Procedure: ROTATOR CUFF REPAIR SHOULDER OPEN;  Surgeon: Johnn Hai, MD;  Location: [facility];  Service: Orthopedics;  Laterality: Left;  LEFT MINI OPEN ROTATOR CUFF REPAIR   . SPACE OAR INSTILLATION N/A 12/15/2017   Procedure: SPACE OAR INSTILLATION;  Surgeon: Franchot Gallo, MD;  Location[facility];  Service: Urology;  Laterality: N/A;  . TONSILLECTOMY  child    There were no vitals filed for this visit.  Subjective Assessment - 04/11/19 1447    Subjective  Pt reporting relief from DN lasting a few days. Notes increased tightness after working in the yard yesterday.    Patient Stated Goals  "to have some DN to loosen up the muscles"    Currently in Pain?  Yes    Pain Score  3    up to 8/10 this morning   Pain Location  Back    Pain Orientation  Right;Lower    Pain Descriptors / Indicators  Sharp    Pain Type  Acute pain;Chronic pain    Pain Frequency  Intermittent  [place] Adult PT Treatment/Exercise - 04/11/19 1443      Exercises   Exercises  Lumbar      Lumbar Exercises: Aerobic   Recumbent Bike  L2 x 6 min      Manual Therapy   Manual Therapy  Soft tissue mobilization;Myofascial release    Manual therapy comments  prone    Soft tissue mobilization  R lumbar paraspinals & upper medial glutes & priformis    Myofascial Release  manual TPR to R glute medius/minimus & medial piriformis; pin & stretch to lumbar paraspinals       Trigger Point Dry Needling - 04/11/19 1443    Consent Given?  Yes    Muscles Treated Back/Hip  Erector spinae;Lumbar multifidi;Gluteus minimus;Gluteus medius;Piriformis   Right   Gluteus Minimus Response  Twitch response elicited;Palpable increased muscle length     Gluteus Medius Response  Twitch response elicited;Palpable increased muscle length    Piriformis Response  Twitch response elicited;Palpable increased muscle length    Erector spinae Response  Twitch response elicited;Palpable increased muscle length    Lumbar multifidi Response  Twitch response elicited;Palpable increased muscle length                PT Long Term Goals - 04/04/19 0938      PT LONG TERM GOAL #1   Title  Patient will be independent with ongoing/advanced HEP    Status  On-going    [place] Date  05/11/19      PT LONG TERM GOAL #2   Title  Patient to report ability put on socks independently without increased pain    Status  On-going    Target Date  05/11/19      PT LONG TERM GOAL #3   Title  Patient to report a pain score at worst in the low back/buttock of no greater than 2/10    Status  On-going    Target Date  05/11/19      PT LONG TERM GOAL #4   Title  Patient to demonstrate improved tissue quality with reduced TTP and symmetrical presentation    Status  On-going    Target Date  05/11/19            Plan - 04/11/19 1513    Clinical Impression Statement  Manuel Landry reporting increased pain today but notes he had been working on a screw in the tire on his car over the weekend as well as carrying his 18 lb dog into the groomers yesterday, so he was unsure if either activity may have contributed to his pain. Pain remains localized just lateral to R SIJ - some improvement in muscle tension noted from manual therapy and DN last session but some increased muscle tension still evident in R lumbar paraspinals and medial glutes & piriformis. Patient requesting further DN to address this - manual therapy and DN targeting the above-mentioned areas with positive twitch response elicited and palpable reduction in muscle tension noted as well as patient perceiving decreased restriction upon returning to stand. PT intending to finish out visit with review and update of HEP  as indicated however patient excusing himself to use the bathroom and not returning by end of treatment time.    Personal Factors and Comorbidities  Comorbidity 3+;Age;Past/Current Experience    Comorbidities  chronic LBP x 20 yrs; active prostate cancer; B RTC repair; GERD,IBS, gastritis    Rehab Potential  Good    PT Frequency  1x / week    PT Duration  6 weeks    PT Treatment/Interventions  ADLs/Self Care Home Management;Cryotherapy;Moist Heat;Functional mobility training;Therapeutic activities;Therapeutic exercise;Neuromuscular re-education;Patient/family education;Manual techniques;Dry needling;Taping;Spinal Manipulations;Joint Manipulations    PT Next Visit Plan  Review and update existing HEP as indicated; manual therapy including DN as indicated    Consulted and Agree with Plan of Care  Patient       Patient will benefit from skilled therapeutic intervention in order to improve the following deficits and impairments:  Decreased activity tolerance, Decreased mobility, Decreased range of motion, Decreased strength, Hypomobility, Increased fascial restricitons, Increased muscle spasms, Impaired flexibility, Improper body mechanics, Postural dysfunction, Pain  Visit Diagnosis: Acute right-sided low back pain without sciatica  Muscle spasm of back  Other symptoms and signs involving the musculoskeletal system  Abnormal posture     Problem List Patient Active Problem List   Diagnosis Date Noted  . Anxiety 12/27/2018  . Trigger finger of left thumb 11/17/2018  . Malignant neoplasm of prostate ([place]) 09/06/2017  . Hyperlipidemia 01/27/2017  . Cardiovascular risk factor 08/12/2011  . S/P cholecystectomy 06/22/2011  . Personal history of colonic polyps 06/22/2011  . CHOLELITHIASIS 02/13/2010  . FLATULENCE-GAS-BLOATING 02/13/2010  . CHOLELITHIASIS 02/11/2010  . GERD 02/10/2010  . DIVERTICULOSIS-COLON 02/10/2010  . NAUSEA ALONE 02/10/2010  . ABDOMINAL PAIN RIGHT UPPER QUADRANT  02/10/2010  . ABDOMINAL PAIN, EPIGASTRIC 02/10/2010  . ANEMIA-IRON DEFICIENCY 07/17/2008  . INGUINAL HERNIA 07/17/2008  . IRRITABLE BOWEL SYNDROME 07/17/2008  . GASTRITIS 07/11/2008  . DUODENITIS 07/11/2008  . RENAL CYST, RIGHT 07/11/2008  . DYSPEPSIA 06/12/2008  . CONSTIPATION 05/31/2008  . BACK PAIN, CHRONIC 05/31/2008  . BENIGN PROSTATIC HYPERTROPHY, MILD, HX OF 05/31/2008    Percival Spanish, PT, MPT 04/11/2019, 4:09 PM  [facility] [address]  [place], [place], [address] Phone: [phone]   Fax:  [phone]  Name: Manuel Landry MRN: [id number] Date of Birth: March 16, 1946   PHYSICAL THERAPY DISCHARGE SUMMARY  Visits from Start of Care: 3  Current functional level related to goals / functional outcomes:   Refer to above clinical impression for status as of last visit on 04/11/19. Patient cancelled all remaining appointments following ESI and has not returned to PT in 30 days, therefore will proceed with discharge from PT for this episode.   Remaining deficits:   As above. Unable to formally assess status at discharge due to failure to return to PT.   Education / Equipment:   Review of HEP  Plan: Patient agrees to discharge.  Patient goals were not met. Patient is being discharged due to not returning since the last visit.  ?????     Percival Spanish, PT, MPT 05/10/19, 8:25 AM  [facility] [place], [place], [address] Phone: [phone]   Fax:  [phone]

## 2019-04-17 ENCOUNTER — Other Ambulatory Visit: Payer: Self-pay

## 2019-04-18 ENCOUNTER — Ambulatory Visit (INDEPENDENT_AMBULATORY_CARE_PROVIDER_SITE_OTHER): Payer: Medicare Other | Admitting: Family Medicine

## 2019-04-18 ENCOUNTER — Encounter: Payer: Self-pay | Admitting: Family Medicine

## 2019-04-18 VITALS — BP 132/86 | HR 83 | Temp 95.8°F | Ht 71.0 in | Wt 181.5 lb

## 2019-04-18 DIAGNOSIS — M533 Sacrococcygeal disorders, not elsewhere classified: Secondary | ICD-10-CM

## 2019-04-18 MED ORDER — METHYLPREDNISOLONE ACETATE 40 MG/ML IJ SUSP
40.0000 mg | Freq: Once | INTRAMUSCULAR | Status: AC
  Administered 2019-04-18: 40 mg via INTRA_ARTICULAR

## 2019-04-18 MED ORDER — TIZANIDINE HCL 4 MG PO CAPS: 4.0000 mg | ORAL_CAPSULE | Freq: Three times a day (TID) | ORAL | 0 refills | Status: DC | PRN

## 2019-04-18 MED ORDER — IBUPROFEN 800 MG PO TABS: 800.0000 mg | ORAL_TABLET | Freq: Three times a day (TID) | ORAL | 0 refills | Status: DC | PRN

## 2019-04-18 NOTE — Patient Instructions (Addendum)
Heat (pad or rice pillow in microwave) over affected area, 10-15 minutes twice daily.   Ice/cold pack over area for 10-15 min twice daily.  OK to take Tylenol 1000 mg (2 extra strength tabs) or 975 mg (3 regular strength tabs) every 6 hours as needed.  Let us know if you need anything.  Gluteus Medius Syndrome Rehab It is normal to feel mild stretching, pulling, tightness, or discomfort as you do these exercises, but you should stop right away if you feel sudden pain or your pain gets worse.   Stretching and range of motion exercise This exercise warms up your muscles and joints and improves the movement and flexibility of your hip and pelvis. This exercise also helps to relieve pain and stiffness. Exercise A: Lunge (hip flexor stretch)      1. Kneel on the floor on your left / right knee. Bend your other knee so it is directly over your ankle. 2. Keep good posture with your head over your shoulders. Tuck your tailbone underneath you. This will prevent your back from arching too much. 3. You should feel a gentle stretch in the front of your thigh or hip. If you do not feel a stretch, slowly lunge forward with your chest up. 4. Hold this position for 30 seconds. 5. Slowly return to the starting position. Repeat 2 times. Complete this exercise 3 times per week. Strengthening exercises These exercises build strength and endurance in your hip and pelvis. Endurance is the ability to use your muscles for a long time, even after they get tired. Exercise B: Bridge (hip extensors)    1. Lie on your back on a firm surface with your knees bent and your feet flat on the floor. 2. Tighten your buttocks muscles and lift your bottom off the floor until the trunk of your body is level with your thighs. ? You should feel the muscles working in your buttocks and the back of your thighs. If this exercise is too easy, cross your arms over your chest or lift one leg while your bottom is up off the floor.  ? Do not arch your back. 3. Hold this position for 3 seconds. 4. Slowly lower your hips to the starting position. 5. Let your muscles relax completely between repetitions. Repeat 2 times. Complete this exercise 3 times per week. Exercise C: Straight leg raises (hip abductors)    1. Lie on your side with your left / right leg in the top position. Lie so your head, shoulder, knee, and hip line up. Bend your bottom knee to help you balance. 2. Lift your top leg up 4-6 inches (10-15 cm), keeping your toes pointed straight ahead. 3. Hold this position for 2 seconds. 4. Slowly lower your leg to the starting position and let your muscles relax completely. Repeat for a total of 10 repetitions. Repeat 2 times. Complete this exercise 3 times per week. Exercise D: Hip abductors and external rotators, quadruped 1. Get on your hands and knees on a firm, lightly padded surface. Your hands should be directly below your shoulders, and your knees should be directly below your hips. 2. Lift your left / right knee out to the side. Keep your knee bent. Do not twist your body. 3. Hold this position for 3 seconds. 4. Slowly lower your leg. Repeat for a total of 10 repetitions.  Repeat 2 times. Complete this exercise 3 times per week. Exercise E: Single leg stand 1. Stand near a counter or door frame  to hold onto as needed. It is helpful to look in a mirror for this exercise so you can watch your hip. 2. Squeeze your left / right buttock muscles then lift up your other foot. Do not let your left / righthip push out to the side. 3. Hold this position for 3 seconds. Repeat for a total of 10 repetitions. Repeat 2 times. Complete this exercise 3 times per week. Make sure you discuss any questions you have with your health care provider. Document Released: 05/04/2005 Document Revised: 01/09/2016 Document Reviewed: 04/16/2015 Elsevier Interactive Patient Education  [employer].

## 2019-04-18 NOTE — Progress Notes (Signed)
Musculoskeletal Exam  Patient: Manuel Landry DOB: 1945/11/03  DOS: 04/18/2019  SUBJECTIVE:  Chief Complaint:   Chief Complaint  Patient presents with  . Back Pain    Manuel Landry is a 73 y.o.  male for evaluation and treatment of low back pain.   Onset:  1 month ago. Had picked up a lot of clothing for storage Location: R SI jt/lower back area Character:  aching and sharp  Progression of issue:  is unchanged Associated symptoms: intermittent pain when in certain positions Treatment: to date has been PT, Heat, topical NSAID, ice, and Tylenol.   Neurovascular symptoms: no  ROS: Musculoskeletal/Extremities: +R SI jt pain  Past Medical History:  Diagnosis Date  . Anxiety   . Arthritis   . Blind loop syndrome   . Bulging discs    LOWER BACK  . Diverticulosis   . Family history of malignant neoplasm of gastrointestinal tract   . History of adenomatous polyp of colon   . History of Barrett's esophagus   . History of chronic gastritis   . Hyperlipidemia   . IBS (irritable bowel syndrome)   . Malignant neoplasm of prostate [facility]) urologist-  dr dahlstedt/  oncologist-  dr Tammi Klippel   dx 08-09-2017-- Stage T2a, Gleason 4+5, PSA 3.22 (on finateride)--- treatment ADT and external beam radiation  . PONV (postoperative nausea and vomiting)   . Seasonal allergic rhinitis   . Wears glasses     Objective: VITAL SIGNS: BP 132/86 (BP Location: Left Arm, Patient Position: Sitting, Cuff Size: Normal)   Pulse 83   Temp (!) 95.8 F (35.4 C) (Temporal)   Ht 5\' 11"  (1.803 m)   Wt 181 lb 8 oz (82.3 kg)   SpO2 96%   BMI 25.31 kg/m  Constitutional: Well formed, well developed. No acute distress. Cardiovascular: Brisk cap refill Thorax & Lungs: No accessory muscle use Musculoskeletal: low back.   Tenderness to palpation: yes, over R SI jt Deformity: no Ecchymosis: no Neurologic: Normal sensory function.  Psychiatric: Normal mood. Age appropriate judgment and insight. Alert  & oriented x 3.    Procedure note; SI joint injection Informed consent obtained. The PSIS's were palpated and demarcated with an otoscope speculum tip just medial to the side of interest. The area was cleaned with alcohol. The joint was entered and 40 mg Depo with 2 mL of 1% lidocaine was injected. The area was then bandaged. There were no complications noted.  The patient tolerated the procedure well.  Assessment:  SI (sacroiliac) joint dysfunction - Plan: PR DRAIN/INJECT LARGE JOINT/BURSA, ibuprofen (ADVIL) 800 MG tablet, tiZANidine (ZANAFLEX) 4 MG capsule, methylPREDNISolone acetate (DEPO-MEDROL) injection 40 mg  Plan: Needs to stretch glutes more. Rehab info given in paperwork. Ice, heat, Tylenol. Cont w PT.  F/u prn. The patient voiced understanding and agreement to the plan.   [place], DO 04/18/19  10:30 AM

## 2019-04-19 ENCOUNTER — Ambulatory Visit: Payer: Medicare Other | Admitting: Physical Therapy

## 2019-04-21 ENCOUNTER — Encounter: Payer: Self-pay | Admitting: Family Medicine

## 2019-04-21 ENCOUNTER — Telehealth: Payer: Self-pay

## 2019-04-21 NOTE — Telephone Encounter (Signed)
Patient states that his pain has gone up higher and is having spasmatic muscle spasms.  He wants to maybe try dry needling.  He would like to know from you what should he do?

## 2019-04-21 NOTE — Telephone Encounter (Signed)
Copied from [place] [phone]. Topic: General - Other >> Apr 21, 2019  8:11 AM Leward Quan A wrote: Reason for CRM: Patient called to to request a call back from Dr Irene Limbo nurse states that it is important to speak to her regarding pain he was seen for. Per patient he want to be allowed to do dry needling and want Dr Nani Ravens to know the pain have shifted even though he is doing the exercises recommended. Please call Ph#  [phone]

## 2019-05-02 ENCOUNTER — Encounter: Payer: Self-pay | Admitting: Family Medicine

## 2019-05-05 ENCOUNTER — Encounter: Payer: Self-pay | Admitting: Family Medicine

## 2019-05-08 NOTE — Progress Notes (Signed)
Virtual Visit via Video Note  I connected with patient on 05/09/19 at 10:15 AM EST by audio enabled telemedicine application and verified that I am speaking with the correct person using two identifiers.   THIS ENCOUNTER IS A VIRTUAL VISIT DUE TO COVID-19 - PATIENT WAS NOT SEEN IN THE OFFICE. PATIENT HAS CONSENTED TO VIRTUAL VISIT / TELEMEDICINE VISIT   Location of patient: home  Location of provider: office  I discussed the limitations of evaluation and management by telemedicine and the availability of in person appointments. The patient expressed understanding and agreed to proceed.   Subjective:   Manuel Landry is a 73 y.o. male who presents for Medicare Annual/Subsequent preventive examination.  Review of Systems:  Home Safety/Smoke Alarms: Feels safe in home. Smoke alarms in place.  Lives in [place] at [facility].   Male:   CCS- 10/21/16.  Recall 3 yrs.  PSA- No results found for: PSA      Objective:    Vitals: Unable to assess. This visit is enabled though telemedicine due to Covid 19.   Advanced Directives 05/09/2019 03/30/2019 04/08/2018 12/15/2017 12/02/2017 04/12/2017 10/07/2016  Does Patient Have a Medical Advance Directive? Yes Yes Yes Yes Yes Yes Yes  Type of Paramedic of [place];Living will [place];Living will Living will [place];Living will [place];Living will - [place];Living will  Does patient want to make changes to medical advance directive? No - Patient declined No - Patient declined - - No - Patient declined No - Patient declined -  Copy of [place] in Chart? Yes - validated most recent copy scanned in chart (See row information) Yes - validated most recent copy scanned in chart (See row information) - No - copy requested - - -    Tobacco Social History   Tobacco Use  Smoking Status Never Smoker    Smokeless Tobacco Never Used     Counseling given: Not Answered   Clinical Intake Pain : No/denies pain     Past Medical History:  Diagnosis Date  . Anxiety   . Arthritis   . Blind loop syndrome   . Bulging discs    LOWER BACK  . Diverticulosis   . Family history of malignant neoplasm of gastrointestinal tract   . History of adenomatous polyp of colon   . History of Barrett's esophagus   . History of chronic gastritis   . Hyperlipidemia   . IBS (irritable bowel syndrome)   . Malignant neoplasm of prostate [facility]) urologist-  dr dahlstedt/  oncologist-  dr Tammi Klippel   dx 08-09-2017-- Stage T2a, Gleason 4+5, PSA 3.22 (on finateride)--- treatment ADT and external beam radiation  . PONV (postoperative nausea and vomiting)   . Seasonal allergic rhinitis   . Wears glasses    Past Surgical History:  Procedure Laterality Date  . COLONOSCOPY  last one 10-21-2016  dr pyrtle  . CYSTOSCOPY WITH INSERTION OF UROLIFT N/A 12/15/2017   Procedure: CYSTOSCOPY WITH INSERTION OF UROLIFT, BIOPSY  PROSTATIC URETHAL;  Surgeon: Franchot Gallo, MD;  Location: [facility];  Service: Urology;  Laterality: N/A;  . ESOPHAGOGASTRODUODENOSCOPY (EGD) WITH PROPOFOL  last one 09-26-2013   dr pyrtle  . INGUINAL HERNIA REPAIR Bilateral right 10-25-1997:  left 09-28-2008 (dr m. Hassell Done @ [facility])  . LAPAROSCOPIC CHOLECYSTECTOMY  2012  . MANDIBLE SURGERY Bilateral 1990s   removal calcified bone growths  . ROTATOR CUFF REPAIR Right 03-26-2009  dr Tonita Cong  [facility]  . SHOULDER OPEN ROTATOR CUFF REPAIR  03/17/2012   Procedure: ROTATOR CUFF REPAIR SHOULDER OPEN;  Surgeon: Johnn Hai, MD;  Location: [facility];  Service: Orthopedics;  Laterality: Left;  LEFT MINI OPEN ROTATOR CUFF REPAIR   . SPACE OAR INSTILLATION N/A 12/15/2017   Procedure: SPACE OAR INSTILLATION;  Surgeon: Franchot Gallo, MD;  Location: [facility];  Service: Urology;  Laterality: N/A;  . TONSILLECTOMY  child   Family  History  Problem Relation Age of Onset  . Colon cancer Mother 57  . Diabetes Maternal Grandmother   . COPD Sister   . Heart failure Father 75  . Esophageal cancer Neg Hx   . Rectal cancer Neg Hx   . Stomach cancer Neg Hx    Social History   Socioeconomic History  . Marital status: Married    Spouse name: Caren Griffins  . Number of children: 0  . Years of education: Not on file  . Highest education level: Not on file  Occupational History  . Occupation: Retired    Fish farm manager: RETIRED  Tobacco Use  . Smoking status: Never Smoker  . Smokeless tobacco: Never Used  Substance and Sexual Activity  . Alcohol use: Yes    Comment: occasional  . Drug use: No  . Sexual activity: Yes    Comment: vasectomy 2011  Other Topics Concern  . Not on file  Social History Narrative  . Not on file   Social Determinants of Health   Financial Resource Strain:   . Difficulty of Paying Living Expenses: Not on file  Food Insecurity:   . Worried About Charity fundraiser in the Last Year: Not on file  . Ran Out of Food in the Last Year: Not on file  Transportation Needs:   . Lack of Transportation (Medical): Not on file  . Lack of Transportation (Non-Medical): Not on file  Physical Activity:   . Days of Exercise per Week: Not on file  . Minutes of Exercise per Session: Not on file  Stress:   . Feeling of Stress : Not on file  Social Connections:   . Frequency of Communication with Friends and Family: Not on file  . Frequency of Social Gatherings with Friends and Family: Not on file  . Attends Religious Services: Not on file  . Active Member of Clubs or Organizations: Not on file  . Attends Archivist Meetings: Not on file  . Marital Status: Not on file    Outpatient Encounter Medications as of 05/09/2019  Medication Sig  . diazepam (VALIUM) 10 MG tablet Take 1 tablet (10 mg total) by mouth every 12 (twelve) hours as needed for anxiety.  . diclofenac sodium (VOLTAREN) 1 % GEL APPLY 2  GRAMS TOPICALLY 2 (TWO) TIMES DAILY AS NEEDED. TO PAINFUL AREAS ON SHOULDERS  . fluticasone (FLONASE) 50 MCG/ACT nasal spray Place 2 sprays into both nostrils daily.  . Glucosamine-Chondroitin (MOVE FREE PO) Take 1 tablet by mouth daily.  Marland Kitchen ibuprofen (ADVIL) 800 MG tablet Take 1 tablet (800 mg total) by mouth every 8 (eight) hours as needed.  . magnesium oxide (MAG-OX) 400 MG tablet Take 400 mg by mouth daily.   . Multiple Vitamins-Minerals (OCUVITE ADULT 50+) CAPS Take 1 capsule by mouth daily.  . tadalafil (CIALIS) 5 MG tablet Take 5 mg by mouth every evening.   Marland Kitchen tiZANidine (ZANAFLEX) 4 MG capsule Take 1 capsule (4 mg total) by mouth 3 (three) times daily as  needed for muscle spasms.  . vitamin B-12 (CYANOCOBALAMIN) 1000 MCG tablet Take 1,000 mcg by mouth daily.  . [DISCONTINUED] azelastine (ASTELIN) 137 MCG/SPRAY nasal spray Place 1 spray into the nose as needed. Pt states his spray is 0.1% and he only uses nasal spray if needed.   . [DISCONTINUED] tamsulosin (FLOMAX) 0.4 MG CAPS capsule Take 0.4 mg by mouth every evening.    No facility-administered encounter medications on file as of 05/09/2019.    Activities of Daily Living In your present state of health, do you have any difficulty performing the following activities: 05/09/2019  Hearing? N  Vision? N  Difficulty concentrating or making decisions? N  Walking or climbing stairs? N  Dressing or bathing? N  Doing errands, shopping? N  Preparing Food and eating ? N  Using the Toilet? N  In the past six months, have you accidently leaked urine? N  Do you have problems with loss of bowel control? N  Managing your Medications? N  Managing your Finances? N  Housekeeping or managing your Housekeeping? N  Some recent data might be hidden    Patient Care Team: [name], DO as PCP - General (Family Medicine) Franchot Gallo, MD as Consulting Physician (Urology) Karl Luke, MD as Consulting Physician  (Optometry) Danella Sensing, MD as Consulting Physician (Dermatology) Burnell Blanks, MD as Consulting Physician (Cardiology) Pyrtle, Lajuan Lines, MD as Consulting Physician (Gastroenterology) Paulla Dolly Tamala Fothergill, DPM as Consulting Physician (Podiatry)   Assessment:   This is a routine wellness examination for [place]. Physical assessment deferred to PCP.  Exercise Activities and Dietary recommendations Current Exercise Habits: Home exercise routine, Type of exercise: stretching, Time (Minutes): 30, Frequency (Times/Week): 7, Weekly Exercise (Minutes/Week): 210, Intensity: Mild, Exercise limited by: None identified Diet (meal preparation, eat out, water intake, caffeinated beverages, dairy products, fruits and vegetables): well balanced   Goals    . Patient Stated     Maintain current health.        Fall Risk Fall Risk  05/09/2019 09/07/2017 10/26/2016  Falls in the past year? 0 No No  Follow up Education provided;Falls prevention discussed - -    Depression Screen PHQ 2/9 Scores 05/09/2019 09/07/2017 10/26/2016  PHQ - 2 Score 0 0 0  PHQ- 9 Score - - 0    Cognitive Function Ad8 score reviewed for issues:  Issues making decisions:no  Less interest in hobbies / activities:no  Repeats questions, stories (family complaining):no  Trouble using ordinary gadgets (microwave, computer, phone):no  Forgets the month or year: no  Mismanaging finances: no  Remembering appts:no  Daily problems with thinking and/or memory:no Ad8 score is=0         Immunization History  Administered Date(s) Administered  . Influenza, High Dose Seasonal PF 03/01/2012, 04/20/2013, 02/16/2014, 03/08/2015, 02/25/2016, 01/27/2017, 03/07/2018  . Influenza-Unspecified 03/01/2009, 03/11/2010, 03/10/2011  . Pneumococcal Conjugate-13 08/15/2013  . Pneumococcal Polysaccharide-23 08/05/2011  . Td 08/05/2003  . Tdap 09/07/2011, 02/23/2019  . Tetanus 10/27/2010  . Zoster 07/14/2010   Screening  Tests Health Maintenance  Topic Date Due  . COLONOSCOPY  10/22/2019  . TETANUS/TDAP  02/22/2029  . INFLUENZA VACCINE  Completed  . Hepatitis C Screening  Completed  . PNA vac Low Risk Adult  Completed       Plan:   See you next year!  Continue to eat heart healthy diet (full of fruits, vegetables, whole grains, lean protein, water--limit salt, fat, and sugar intake) and increase physical activity as tolerated.  Continue doing brain  stimulating activities (puzzles, reading, adult coloring books, staying active) to keep memory sharp.   I have personally reviewed and noted the following in the patient's chart:   . Medical and social history . Use of alcohol, tobacco or illicit drugs  . Current medications and supplements . Functional ability and status . Nutritional status . Physical activity . Advanced directives . List of other physicians . Hospitalizations, surgeries, and ER visits in previous 12 months . Vitals . Screenings to include cognitive, depression, and falls . Referrals and appointments  In addition, I have reviewed and discussed with patient certain preventive protocols, quality metrics, and best practice recommendations. A written personalized care plan for preventive services as well as general preventive health recommendations were provided to patient.     Shela Nevin, [place]  05/09/2019

## 2019-05-09 ENCOUNTER — Encounter: Payer: Self-pay | Admitting: *Deleted

## 2019-05-09 ENCOUNTER — Other Ambulatory Visit: Payer: Self-pay

## 2019-05-09 ENCOUNTER — Ambulatory Visit (INDEPENDENT_AMBULATORY_CARE_PROVIDER_SITE_OTHER): Payer: Medicare Other | Admitting: *Deleted

## 2019-05-09 DIAGNOSIS — Z0000 Encounter for general adult medical examination without abnormal findings: Secondary | ICD-10-CM

## 2019-05-09 NOTE — Patient Instructions (Signed)
See you next year!  Continue to eat heart healthy diet (full of fruits, vegetables, whole grains, lean protein, water--limit salt, fat, and sugar intake) and increase physical activity as tolerated.  Continue doing brain stimulating activities (puzzles, reading, adult coloring books, staying active) to keep memory sharp.    Manuel Landry , Thank you for taking time to come for your Medicare Wellness Visit. I appreciate your ongoing commitment to your health goals. Please review the following plan we discussed and let me know if I can assist you in the future.   These are the goals we discussed: Goals    . Patient Stated     Maintain current health.        This is a list of the screening recommended for you and due dates:  Health Maintenance  Topic Date Due  . Colon Cancer Screening  10/22/2019  . Tetanus Vaccine  02/22/2029  . Flu Shot  Completed  .  Hepatitis C: One time screening is recommended by Center for Disease Control  (CDC) for  adults born from [birth year] through 1965.   Completed  . Pneumonia vaccines  Completed    Preventive Care 73 Years and Older, Male Preventive care refers to lifestyle choices and visits with your health care provider that can promote health and wellness. This includes:  A yearly physical exam. This is also called an annual well check.  Regular dental and eye exams.  Immunizations.  Screening for certain conditions.  Healthy lifestyle choices, such as diet and exercise. What can I expect for my preventive care visit? Physical exam Your health care provider will check:  Height and weight. These may be used to calculate body mass index (BMI), which is a measurement that tells if you are at a healthy weight.  Heart rate and blood pressure.  Your skin for abnormal spots. Counseling Your health care provider may ask you questions about:  Alcohol, tobacco, and drug use.  Emotional well-being.  Home and relationship well-being.  Sexual  activity.  Eating habits.  History of falls.  Memory and ability to understand (cognition).  Work and work Statistician. What immunizations do I need?  Influenza (flu) vaccine  This is recommended every year. Tetanus, diphtheria, and pertussis (Tdap) vaccine  You may need a Td booster every 10 years. Varicella (chickenpox) vaccine  You may need this vaccine if you have not already been vaccinated. Zoster (shingles) vaccine  You may need this after age 73. Pneumococcal conjugate (PCV13) vaccine  One dose is recommended after age 73. Pneumococcal polysaccharide (PPSV23) vaccine  One dose is recommended after age 73. Measles, mumps, and rubella (MMR) vaccine  You may need at least one dose of MMR if you were born in 1957 or later. You may also need a second dose. Meningococcal conjugate (MenACWY) vaccine  You may need this if you have certain conditions. Hepatitis A vaccine  You may need this if you have certain conditions or if you travel or work in places where you may be exposed to hepatitis A. Hepatitis B vaccine  You may need this if you have certain conditions or if you travel or work in places where you may be exposed to hepatitis B. Haemophilus influenzae type b (Hib) vaccine  You may need this if you have certain conditions. You may receive vaccines as individual doses or as more than one vaccine together in one shot (combination vaccines). Talk with your health care provider about the risks and benefits of combination vaccines. What  tests do I need? Blood tests  Lipid and cholesterol levels. These may be checked every 5 years, or more frequently depending on your overall health.  Hepatitis C test.  Hepatitis B test. Screening  Lung cancer screening. You may have this screening every year starting at age 73 if you have a 30-pack-year history of smoking and currently smoke or have quit within the past 15 years.  Colorectal cancer screening. All adults  should have this screening starting at age 73 and continuing until age 64. Your health care provider may recommend screening at age 73 if you are at increased risk. You will have tests every 1-10 years, depending on your results and the type of screening test.  Prostate cancer screening. Recommendations will vary depending on your family history and other risks.  Diabetes screening. This is done by checking your blood sugar (glucose) after you have not eaten for a while (fasting). You may have this done every 1-3 years.  Abdominal aortic aneurysm (AAA) screening. You may need this if you are a current or former smoker.  Sexually transmitted disease (STD) testing. Follow these instructions at home: Eating and drinking  Eat a diet that includes fresh fruits and vegetables, whole grains, lean protein, and low-fat dairy products. Limit your intake of foods with high amounts of sugar, saturated fats, and salt.  Take vitamin and mineral supplements as recommended by your health care provider.  Do not drink alcohol if your health care provider tells you not to drink.  If you drink alcohol: ? Limit how much you have to 0-2 drinks a day. ? Be aware of how much alcohol is in your drink. In the [place] one drink equals one 12 oz bottle of beer (355 mL), one 5 oz glass of wine (148 mL), or one 1 oz glass of hard liquor (44 mL). Lifestyle  Take daily care of your teeth and gums.  Stay active. Exercise for at least 30 minutes on 5 or more days each week.  Do not use any products that contain nicotine or tobacco, such as cigarettes, e-cigarettes, and chewing tobacco. If you need help quitting, ask your health care provider.  If you are sexually active, practice safe sex. Use a condom or other form of protection to prevent STIs (sexually transmitted infections).  Talk with your health care provider about taking a low-dose aspirin or statin. What's next?  Visit your health care provider once a year  for a well check visit.  Ask your health care provider how often you should have your eyes and teeth checked.  Stay up to date on all vaccines. This information is not intended to replace advice given to you by your health care provider. Make sure you discuss any questions you have with your health care provider. Document Released: 05/31/2015 Document Revised: 04/28/2018 Document Reviewed: 04/28/2018 Elsevier Patient Education  2020 [employer].

## 2019-05-15 ENCOUNTER — Telehealth: Payer: Self-pay | Admitting: Family Medicine

## 2019-05-15 MED ORDER — DIAZEPAM 10 MG PO TABS: 10.0000 mg | ORAL_TABLET | Freq: Two times a day (BID) | ORAL | 1 refills | Status: DC | PRN

## 2019-05-15 NOTE — Telephone Encounter (Signed)
Requesting:   Diazepam Contract:   None UDS:   None Last Visit:   04/18/2019 Next Visit:    06/27/2019 Last Refill:   #90 with 1 refill on 07/07/2018  Please Advise

## 2019-05-16 DIAGNOSIS — Z23 Encounter for immunization: Secondary | ICD-10-CM | POA: Diagnosis not present

## 2019-05-17 ENCOUNTER — Encounter: Payer: Self-pay | Admitting: Family Medicine

## 2019-05-26 ENCOUNTER — Encounter: Payer: Self-pay | Admitting: Family Medicine
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
## 2019-06-13 DIAGNOSIS — Z23 Encounter for immunization: Secondary | ICD-10-CM | POA: Diagnosis not present

## 2019-06-27 ENCOUNTER — Ambulatory Visit: Payer: Medicare Other | Admitting: Family Medicine
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
## 2019-07-07 DIAGNOSIS — C61 Malignant neoplasm of prostate: Secondary | ICD-10-CM | POA: Diagnosis not present
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
## 2019-07-14 DIAGNOSIS — R351 Nocturia: Secondary | ICD-10-CM | POA: Diagnosis not present

## 2019-07-14 DIAGNOSIS — C61 Malignant neoplasm of prostate: Secondary | ICD-10-CM | POA: Diagnosis not present

## 2019-07-14 DIAGNOSIS — N403 Nodular prostate with lower urinary tract symptoms: Secondary | ICD-10-CM | POA: Diagnosis not present

## 2019-07-14 DIAGNOSIS — N401 Enlarged prostate with lower urinary tract symptoms: Secondary | ICD-10-CM | POA: Diagnosis not present

## 2019-07-17 ENCOUNTER — Encounter: Payer: Self-pay | Admitting: Family Medicine

## 2019-07-17 ENCOUNTER — Other Ambulatory Visit: Payer: Self-pay | Admitting: Family Medicine

## 2019-07-17 DIAGNOSIS — M549 Dorsalgia, unspecified: Secondary | ICD-10-CM

## 2019-07-17 DIAGNOSIS — M533 Sacrococcygeal disorders, not elsewhere classified: Secondary | ICD-10-CM

## 2019-07-18 ENCOUNTER — Encounter: Payer: Self-pay | Admitting: Physical Therapy

## 2019-07-18 ENCOUNTER — Ambulatory Visit: Payer: Medicare Other | Admitting: Family Medicine

## 2019-07-18 ENCOUNTER — Encounter: Payer: Self-pay | Admitting: Family Medicine

## 2019-07-18 NOTE — Telephone Encounter (Signed)
Had appt at rehab [facility] cancelled - called pt and confirmed he wants order to go to [facility]. He said it is easier to walk [place] the hill than to come to [facility] for rehab. I have LM for [facility] to get fax # to send the referral/order to.

## 2019-07-18 NOTE — Addendum Note (Signed)
Addended by: Karle Barr on: 07/18/2019 03:04 PM   Modules accepted: Orders

## 2019-07-19 NOTE — Telephone Encounter (Signed)
[phone] Fax Number to [facility]

## 2019-07-20 DIAGNOSIS — M6281 Muscle weakness (generalized): Secondary | ICD-10-CM | POA: Diagnosis not present

## 2019-07-20 DIAGNOSIS — M5418 Radiculopathy, sacral and sacrococcygeal region: Secondary | ICD-10-CM | POA: Diagnosis not present

## 2019-07-20 DIAGNOSIS — R293 Abnormal posture: Secondary | ICD-10-CM | POA: Diagnosis not present

## 2019-07-20 NOTE — Telephone Encounter (Signed)
Faxed orders to [facility]

## 2019-07-24 ENCOUNTER — Ambulatory Visit: Payer: Medicare Other | Admitting: Physical Therapy

## 2019-07-24 DIAGNOSIS — R293 Abnormal posture: Secondary | ICD-10-CM | POA: Diagnosis not present

## 2019-07-24 DIAGNOSIS — M6281 Muscle weakness (generalized): Secondary | ICD-10-CM | POA: Diagnosis not present

## 2019-07-24 DIAGNOSIS — M5418 Radiculopathy, sacral and sacrococcygeal region: Secondary | ICD-10-CM | POA: Diagnosis not present

## 2019-07-27 ENCOUNTER — Other Ambulatory Visit: Payer: Self-pay

## 2019-07-28 ENCOUNTER — Encounter: Payer: Self-pay | Admitting: Family Medicine

## 2019-07-28 ENCOUNTER — Ambulatory Visit (INDEPENDENT_AMBULATORY_CARE_PROVIDER_SITE_OTHER): Payer: Medicare Other | Admitting: Family Medicine

## 2019-07-28 ENCOUNTER — Other Ambulatory Visit: Payer: Self-pay

## 2019-07-28 VITALS — BP 118/80 | HR 95 | Temp 95.5°F | Ht 71.0 in | Wt 178.3 lb

## 2019-07-28 DIAGNOSIS — L989 Disorder of the skin and subcutaneous tissue, unspecified: Secondary | ICD-10-CM | POA: Diagnosis not present

## 2019-07-28 DIAGNOSIS — L853 Xerosis cutis: Secondary | ICD-10-CM | POA: Diagnosis not present

## 2019-07-28 DIAGNOSIS — F418 Other specified anxiety disorders: Secondary | ICD-10-CM | POA: Diagnosis not present

## 2019-07-28 MED ORDER — SERTRALINE HCL 50 MG PO TABS: 50.0000 mg | ORAL_TABLET | Freq: Every day | ORAL | 3 refills | Status: DC

## 2019-07-28 NOTE — Progress Notes (Signed)
Chief Complaint  Patient presents with  . Follow-up    6 month    Subjective: Patient is a 74 y.o. male here for f/u.  1 week of a scaly lesion on L forearm. No new topicals. No inj that he can recall, but he does have random scrapes without known cause. Sees dermatology. Does not bleed, itch or hurt.   Dry fingers leading to cuts on finger tips. He uses Aquaphor before bed. No other meds used.   6 weeks ago after he got his 2nd covid shot, he started having the sensation he was going to belch. Tagamet helps a little. Peppermint will sometimes eat. Diet has not changed. Cookies help. Sometimes laying down makes it worse. No dietary triggers.  Takes Valium for anxiety related to prostate cancer and Lupron injections.  Takes 5 mg daily. No AE's. He is not on a daily medication. He is seeing a Social worker.   ROS: Heart: Denies chest pain  Lungs: Denies SOB   Past Medical History:  Diagnosis Date  . Anxiety   . Arthritis   . Blind loop syndrome   . Bulging discs    LOWER BACK  . Diverticulosis   . Family history of malignant neoplasm of gastrointestinal tract   . History of adenomatous polyp of colon   . History of Barrett's esophagus   . History of chronic gastritis   . Hyperlipidemia   . IBS (irritable bowel syndrome)   . Malignant neoplasm of prostate [facility]) urologist-  dr dahlstedt/  oncologist-  dr Tammi Klippel   dx 08-09-2017-- Stage T2a, Gleason 4+5, PSA 3.22 (on finateride)--- treatment ADT and external beam radiation  . PONV (postoperative nausea and vomiting)   . Seasonal allergic rhinitis   . Wears glasses     Objective: BP 118/80 (BP Location: Left Arm, Patient Position: Sitting, Cuff Size: Normal)   Pulse 95   Temp (!) 95.5 F (35.3 C) (Temporal)   Ht 5\' 11"  (1.803 m)   Wt 178 lb 5 oz (80.9 kg)   SpO2 94%   BMI 24.87 kg/m  General: Awake, appears stated age Heart: RRR, no murmurs Lungs: CTAB, no rales, wheezes or rhonchi. No accessory muscle use Psych: Age  appropriate judgment and insight, normal affect and mood  Assessment and Plan: Situational anxiety - Plan: sertraline (ZOLOFT) 50 MG tablet  Skin lesion  Dry skin dermatitis  1- CSC for Valium signed today. Start Zoloft. F/u in 6 weeks to reck. 2- Monitor, f/u with Derm.  3- Use lotion after every time he washes hands.  The patient voiced understanding and agreement to the plan.  [place], DO 07/28/19  4:22 PM

## 2019-07-28 NOTE — Patient Instructions (Signed)
I would watch the lesion on your L forearm. Bring it up to your dermatologist.   Use lotion every time after you wash your hands. This will help stave off cracks in your hands.   Let us know if you need anything.

## 2019-07-31 DIAGNOSIS — M5418 Radiculopathy, sacral and sacrococcygeal region: Secondary | ICD-10-CM | POA: Diagnosis not present

## 2019-07-31 DIAGNOSIS — M6281 Muscle weakness (generalized): Secondary | ICD-10-CM | POA: Diagnosis not present

## 2019-07-31 DIAGNOSIS — R293 Abnormal posture: Secondary | ICD-10-CM | POA: Diagnosis not present

## 2019-08-07 ENCOUNTER — Telehealth: Payer: Self-pay | Admitting: *Deleted

## 2019-08-07 ENCOUNTER — Other Ambulatory Visit: Payer: Self-pay

## 2019-08-07 NOTE — Telephone Encounter (Signed)
Left message on machine to call back to schedule appointment for today.

## 2019-08-07 NOTE — Telephone Encounter (Signed)
Caller Name Greene Phone Number [phone] Patient Name Manuel Landry Patient DOB 11-14-1945 Call Type Message Only Information Provided Reason for Call Request to Schedule Office Appointment Initial Comment Caller would like an appointment todya.

## 2019-08-08 ENCOUNTER — Encounter: Payer: Self-pay | Admitting: Family Medicine

## 2019-08-08 ENCOUNTER — Ambulatory Visit (INDEPENDENT_AMBULATORY_CARE_PROVIDER_SITE_OTHER): Payer: Medicare Other | Admitting: Family Medicine

## 2019-08-08 ENCOUNTER — Other Ambulatory Visit: Payer: Self-pay

## 2019-08-08 VITALS — BP 126/82 | HR 86 | Temp 95.6°F | Ht 71.0 in | Wt 176.0 lb

## 2019-08-08 DIAGNOSIS — H6983 Other specified disorders of Eustachian tube, bilateral: Secondary | ICD-10-CM

## 2019-08-08 NOTE — Progress Notes (Signed)
Chief Complaint  Patient presents with  . Ear Problem    mostly left ear    Subjective: Patient is a 74 y.o. male here for Left ear fullness.  Started 1 week ago, b/l ear fullness that is worse on the L. No drainage, does feel fullness. Die have an episode of vertigo then. Does use Q tips but does not jam them inside. Denies fevers, pain, URI s/s's. He does have frontal sinus pressure. Has not tried anything at home.   Past Medical History:  Diagnosis Date  . Anxiety   . Arthritis   . Blind loop syndrome   . Bulging discs    LOWER BACK  . Diverticulosis   . Family history of malignant neoplasm of gastrointestinal tract   . History of adenomatous polyp of colon   . History of Barrett's esophagus   . History of chronic gastritis   . Hyperlipidemia   . IBS (irritable bowel syndrome)   . Malignant neoplasm of prostate [facility]) urologist-  dr dahlstedt/  oncologist-  dr Tammi Klippel   dx 08-09-2017-- Stage T2a, Gleason 4+5, PSA 3.22 (on finateride)--- treatment ADT and external beam radiation  . PONV (postoperative nausea and vomiting)   . Seasonal allergic rhinitis   . Wears glasses     Objective: BP 126/82 (BP Location: Left Arm, Patient Position: Sitting, Cuff Size: Normal)   Pulse 86   Temp (!) 95.6 F (35.3 C) (Temporal)   Ht 5\' 11"  (1.803 m)   Wt 176 lb (79.8 kg)   SpO2 94%   BMI 24.55 kg/m  General: Awake, appears stated age HEENT: MMM, EOMi; no ttp over sinuses, canals are patent bilaterally, mildly retracted TM on the left, no fluid or erythema; TM is normal on the right Lungs:  No accessory muscle use Psych: Age appropriate judgment and insight, normal affect and mood  Assessment and Plan: Dysfunction of both eustachian tubes  Start back on Flonase daily.  If no improvement by next week, will send in prednisone. The patient voiced understanding and agreement to the plan.  [place], DO 08/08/19  12:02 PM

## 2019-08-08 NOTE — Patient Instructions (Addendum)
I would continue the Flonase daily, send me a message on Monday if no improvement and I will call in an oral steroid.   Let us know if you need anything.

## 2019-08-11 NOTE — Progress Notes (Signed)
Counseling Intern spoke to pt on 08/11/2019 for a telehealth counseling session via phone. Counseling intern provided space for pt to open up about experiences and responded with empathy, compassion, and normalization of feelings. Pt presented to counseling as tired and in a neutral mood.  Pt reported he was sick on Sunday and experienced vommitting, diarrhea, and dizziness. Pt processed feelings related to not being able to recover from his illness quickly, but expressed he accepts that it will take him a week or two to completely recover. Pt stated that he "kept his cool" over the past two weeks when something happened that frustrated him. Pt expressed how important his dog is to him as evidenced by his willingness to miss church to stay home with his dog when it is stormy.       CI pointed out that the pt continues to keep himself busy as he can with small tasks even as he continues to recover and that he enjoyed a lunch out today with his friend today. CI encouraged pt to continue using his awareness and his strategies to control his anger and his mood.   Next Counseling Session: Friday, August 25, 2019 at 2:00pm via video  [place] Counseling Intern Voicemail:  [phone]

## 2019-08-17 DIAGNOSIS — R293 Abnormal posture: Secondary | ICD-10-CM | POA: Diagnosis not present

## 2019-08-17 DIAGNOSIS — M6281 Muscle weakness (generalized): Secondary | ICD-10-CM | POA: Diagnosis not present

## 2019-08-17 DIAGNOSIS — M5418 Radiculopathy, sacral and sacrococcygeal region: Secondary | ICD-10-CM | POA: Diagnosis not present

## 2019-08-20 ENCOUNTER — Other Ambulatory Visit: Payer: Self-pay | Admitting: Family Medicine

## 2019-08-20 DIAGNOSIS — F418 Other specified anxiety disorders: Secondary | ICD-10-CM

## 2019-08-22 ENCOUNTER — Other Ambulatory Visit: Payer: Self-pay

## 2019-08-22 ENCOUNTER — Telehealth: Payer: Self-pay | Admitting: Internal Medicine

## 2019-08-22 DIAGNOSIS — R197 Diarrhea, unspecified: Secondary | ICD-10-CM

## 2019-08-22 NOTE — Telephone Encounter (Signed)
Pt aware, orders in epic. States he will come to the lab one day next week.

## 2019-08-22 NOTE — Telephone Encounter (Signed)
He is due a surveillance colon in June 2021, however with diarrhea, I would recommend we check stool studies 1st -GI pathogen panel -Fecal leukocytes -Fecal elastase

## 2019-08-22 NOTE — Telephone Encounter (Signed)
Pt states he has been having diarrhea for the past 2 weeks. States a few hours after he eats he has diarrhea. Reports he has not been on any antibiotics. He ate a [employer] and thinks the nuts on it were bad and he feels the symptoms started after eating it. Please advise.

## 2019-08-23 ENCOUNTER — Other Ambulatory Visit: Payer: Medicare Other

## 2019-08-24 ENCOUNTER — Other Ambulatory Visit: Payer: Medicare Other

## 2019-08-24 DIAGNOSIS — R197 Diarrhea, unspecified: Secondary | ICD-10-CM | POA: Diagnosis not present

## 2019-08-25 NOTE — Progress Notes (Signed)
Counseling intern (CI) spoke to patient on 08/25/2019 for a telehealth counseling session via video. Counseling intern provided space for pt to open up about experiences and responded with empathy, compassion, and normalization of feelings. Pt presented to counseling with a neutral mood and normal affect. Additionally, during the session CI noted pt's mood had improved as compared to the previous session despite still feeling ill.  Pt reported he is still experiencing diarrhea everyday, is feeling fatigued, and is awaiting lab results for more information to determine possible treatment. He stated that he has been busy despite his symptoms and was able to stay somewhat active as evidenced by his reporting he planted bushes for a friend. He stated that he remains optimistic that this illness will end soon. Pt reported he was not able to celebrate his birthday, but plans to do something when he is feeling better. Pt also shared some positive memories from his past including his wedding day and honeymoon and CI noted pt experienced increased cheerfulness. He also spoke about some difficult times in his life including his divorce, but reflected on these experiences with acceptance.   CI helped pt process emotions regarding recent events and discussed the benefit of remembering positive memories. CI plans to review the pt's experiences and growth in counseling and ask pt about any take-aways during the termination session.   Final Counseling Session: Thursday, September 07, 2019 at 11:00am  [name] Counseling Intern Voicemail:  [phone]

## 2019-08-27 LAB — GI PROFILE, STOOL, PCR

## 2019-08-27 LAB — PANCREATIC ELASTASE, FECAL

## 2019-08-28 ENCOUNTER — Encounter: Payer: Self-pay | Admitting: Internal Medicine

## 2019-08-28 ENCOUNTER — Ambulatory Visit (INDEPENDENT_AMBULATORY_CARE_PROVIDER_SITE_OTHER): Payer: Medicare Other | Admitting: Internal Medicine

## 2019-08-28 ENCOUNTER — Other Ambulatory Visit (INDEPENDENT_AMBULATORY_CARE_PROVIDER_SITE_OTHER): Payer: Medicare Other

## 2019-08-28 ENCOUNTER — Telehealth: Payer: Self-pay | Admitting: Internal Medicine

## 2019-08-28 ENCOUNTER — Other Ambulatory Visit: Payer: Self-pay

## 2019-08-28 ENCOUNTER — Other Ambulatory Visit: Payer: Self-pay | Admitting: Family Medicine

## 2019-08-28 VITALS — BP 124/72 | HR 84 | Temp 97.9°F | Ht 71.0 in | Wt 170.8 lb

## 2019-08-28 DIAGNOSIS — A0811 Acute gastroenteropathy due to Norwalk agent: Secondary | ICD-10-CM

## 2019-08-28 DIAGNOSIS — R197 Diarrhea, unspecified: Secondary | ICD-10-CM

## 2019-08-28 LAB — COMPREHENSIVE METABOLIC PANEL
ALT: 14 U/L (ref 0–53)
AST: 18 U/L (ref 0–37)
Albumin: 4.4 g/dL (ref 3.5–5.2)
Alkaline Phosphatase: 43 U/L (ref 39–117)
BUN: 11 mg/dL (ref 6–23)
CO2: 27 mEq/L (ref 19–32)
Calcium: 9.3 mg/dL (ref 8.4–10.5)
Chloride: 104 mEq/L (ref 96–112)
Creatinine, Ser: 0.82 mg/dL (ref 0.40–1.50)
GFR: 91.84 mL/min (ref >60.00)
Glucose, Bld: 95 mg/dL (ref 70–99)
Potassium: 4.2 mEq/L (ref 3.5–5.1)
Sodium: 137 mEq/L (ref 135–145)
Total Bilirubin: 0.4 mg/dL (ref 0.2–1.2)
Total Protein: 7 g/dL (ref 6.0–8.3)

## 2019-08-28 LAB — CBC
HCT: 39.2 % (ref 39.0–52.0)
Hemoglobin: 13.3 g/dL (ref 13.0–17.0)
MCHC: 34 g/dL (ref 30.0–36.0)
MCV: 89.2 fl (ref 78.0–100.0)
Platelets: 214 10*3/uL (ref 150.0–400.0)
RBC: 4.4 Mil/uL (ref 4.22–5.81)
RDW: 13.5 % (ref 11.5–15.5)
WBC: 4.7 10*3/uL (ref 4.0–10.5)

## 2019-08-28 MED ORDER — DIPHENOXYLATE-ATROPINE 2.5-0.025 MG PO TABS: 1.0000 | ORAL_TABLET | Freq: Four times a day (QID) | ORAL | 1 refills | Status: DC | PRN

## 2019-08-28 NOTE — Telephone Encounter (Signed)
Patient notified of the results  

## 2019-08-28 NOTE — Patient Instructions (Addendum)
We have sent the following medications to your pharmacy for you to pick up at your convenience: Lomotil 1 tablet four times daily as needed for diarrhea. _________________________________________________________ Please purchase the following medications over the counter and take as directed: Florastor-Take 1 capsule twice daily x 1 month _________________________________________________________ Please call our office and let us know how you are doing in 7-10 days. Our phone number is [phone]. You may also send a message through mychart if easier. _________________________________________________________ If you are age 74 or older, your body mass index should be between 23-30. Your Body mass index is 23.82 kg/m. If this is out of the aforementioned range listed, please consider follow up with your Primary Care Provider.  If you are age 74 or younger, your body mass index should be between 19-25. Your Body mass index is 23.82 kg/m. If this is out of the aformentioned range listed, please consider follow up with your Primary Care Provider.  ________________________________________________________ Due to recent changes in healthcare laws, you may see the results of your imaging and laboratory studies on MyChart before your provider has had a chance to review them.  We understand that in some cases there may be results that are confusing or concerning to you. Not all laboratory results come back in the same time frame and the provider may be waiting for multiple results in order to interpret others.  Please give [place] 48 hours in order for your provider to thoroughly review all the results before contacting the office for clarification of your results.

## 2019-08-28 NOTE — Progress Notes (Signed)
Subjective:    Patient ID: Manuel Landry, male    DOB: 10/19/1945, 74 y.o.   MRN: [id number]  HPI Manuel Landry is a 74 year old male with a history of adenomatous and sessile serrated polyps, colonic diverticulosis and small hemorrhoids who is seen today to evaluate subacute diarrhea.  He is here alone today.  He also has history of prostate cancer  He called our office on 08/22/2019 to report 2 weeks of diarrhea, worse postprandially.  I ordered a GI pathogen panel which returned positive for norovirus.  Given that diarrhea had persisted for 3 weeks he was seen in the office.  He reports that he had developed significant diarrhea which started acutely 3 some days ago.  Started with a regular bowel movement after which she developed lightheadedness but also nausea vomiting.  The diarrhea then started and was multiple times per day.  It was very watery with little form and would occur multiple times throughout the day but also worse after eating.  There was a time when he would have a bowel movement or some leakage of liquid stool with each urination.  He did not have abdominal pain but had a gurgling sensation in his left abdomen prior to bowel movement.  No blood in his stool or melena.  He is eating a bland diet and drinking Powerade and staying very hydrated.  Interestingly his wife had similar symptoms for 3 to 4 weeks prior to his symptoms.  She was seen here for the same symptoms.  He was using Imodium which slowed down the stool somewhat.  He also used Phazyme.  He had lab work just before this office visit which we reviewed together.  It shows a normal CBC and CMP  On reviewing his wife's record because she sees our practice as well she recently had a fairly unremarkable colonoscopy.  Her Linzess and cholestyramine were discontinued and she was started on WelChol twice a day and Benefiber twice a day.  He thought she was started on antibiotic but I cannot find this in her record.  Her  symptoms have resolved.  Review of Systems As per HPI, otherwise negative  Current Medications, Allergies, Past Medical History, Past Surgical History, Family History and Social History were reviewed in [employer] record.     Objective:   Physical Exam BP 124/72   Pulse 84   Temp 97.9 F (36.6 C)   Ht '5\' 11"'  (1.803 m)   Wt 170 lb 12.8 oz (77.5 kg)   BMI 23.82 kg/m  Gen: awake, alert, NAD HEENT: anicteric CV: RRR, no mrg Pulm: CTA b/l Abd: soft, NT/ND, +BS throughout Ext: no c/c/e Neuro: nonfocal  CBC    Component Value Date/Time   WBC 4.7 08/28/2019 1438   RBC 4.40 08/28/2019 1438   HGB 13.3 08/28/2019 1438   HCT 39.2 08/28/2019 1438   PLT 214.0 08/28/2019 1438   MCV 89.2 08/28/2019 1438   MCHC 34.0 08/28/2019 1438   RDW 13.5 08/28/2019 1438   LYMPHSABS 1.0 02/13/2010 1311   MONOABS 0.5 02/13/2010 1311   EOSABS 0.2 02/13/2010 1311   BASOSABS 0.0 02/13/2010 1311   CMP     Component Value Date/Time   NA 137 08/28/2019 1438   K 4.2 08/28/2019 1438   CL 104 08/28/2019 1438   CO2 27 08/28/2019 1438   GLUCOSE 95 08/28/2019 1438   BUN 11 08/28/2019 1438   CREATININE 0.82 08/28/2019 1438   CALCIUM 9.3 08/28/2019 1438  PROT 7.0 08/28/2019 1438   ALBUMIN 4.4 08/28/2019 1438   AST 18 08/28/2019 1438   ALT 14 08/28/2019 1438   ALKPHOS 43 08/28/2019 1438   BILITOT 0.4 08/28/2019 1438   GFRNONAA 78.92 02/13/2010 1311   GFRAA  03/21/2009 1110    >60        The eGFR has been calculated using the MDRD equation. This calculation has not been validated in all clinical situations. eGFR's persistently <60 mL/min signify possible Chronic Kidney Disease.   GI pathogen panel positive for norovirus, remaining pathogens tested were negative     Assessment & Plan:  74 year old male with a history of adenomatous and sessile serrated polyps, colonic diverticulosis and small hemorrhoids who is seen today to evaluate subacute diarrhea.   1.   Norovirus diarrhea/postinfectious irritable bowel --it is very likely that he developed viral enteritis with norovirus.  We discussed how this virus is self-limited and does not require specific medical therapy.  Fortunately his electrolytes are normal as are his blood counts.  I think at this point the pathogen is very likely been eliminated by his immune system but he is experiencing a postinfectious type irritable bowel with loose stools.  I recommended the following --Lomotil 1 to 2 tablets 4 times daily as needed --Begin Florastor 250 mg twice daily for a month --Hold magnesium for the next week or so as magnesium can cause worsening loose stools.  He has taken this for many years for leg cramping.  I do not think this caused the symptoms but can exacerbate loose stools.  He can start this back once symptoms have resolved --He should call me in 7 to 10 days if symptoms fail to improve further and resolved  2.  History of adenomatous and sessile serrated colon polyps --surveillance colonoscopy is due later this year around June  30 minutes total spent today including patient facing time, coordination of care, reviewing medical history/procedures/pertinent radiology studies, and documentation of the encounter.

## 2019-08-28 NOTE — Progress Notes (Signed)
l °

## 2019-08-28 NOTE — Telephone Encounter (Signed)
Patient is seeking lab results

## 2019-09-01 LAB — SPECIMEN STATUS REPORT

## 2019-09-01 LAB — OVA AND PARASITE EXAMINATION

## 2019-09-01 LAB — FECAL LEUKOCYTES

## 2019-09-04 ENCOUNTER — Other Ambulatory Visit: Payer: Self-pay

## 2019-09-05 ENCOUNTER — Ambulatory Visit (INDEPENDENT_AMBULATORY_CARE_PROVIDER_SITE_OTHER): Payer: Medicare Other | Admitting: Family Medicine

## 2019-09-05 ENCOUNTER — Encounter: Payer: Self-pay | Admitting: Family Medicine

## 2019-09-05 ENCOUNTER — Other Ambulatory Visit: Payer: Self-pay

## 2019-09-05 VITALS — BP 132/86 | HR 111 | Temp 95.7°F | Ht 71.0 in | Wt 174.1 lb

## 2019-09-05 DIAGNOSIS — F418 Other specified anxiety disorders: Secondary | ICD-10-CM | POA: Diagnosis not present

## 2019-09-05 DIAGNOSIS — H6983 Other specified disorders of Eustachian tube, bilateral: Secondary | ICD-10-CM | POA: Diagnosis not present

## 2019-09-05 MED ORDER — SERTRALINE HCL 25 MG PO TABS: 25.0000 mg | ORAL_TABLET | Freq: Every day | ORAL | 2 refills | Status: DC

## 2019-09-05 MED ORDER — PREDNISONE 20 MG PO TABS: 40.0000 mg | ORAL_TABLET | Freq: Every day | ORAL | 0 refills | Status: AC

## 2019-09-05 NOTE — Progress Notes (Signed)
Chief Complaint  Patient presents with  . Ear Problem  . Sinusitis    Subjective: Patient is a 74 y.o. male here for f/u.  Patient is here following up on initiation of sertraline.  He did not increase to a full tab to take 50 mg daily.  He is currently taking 25 mg daily.  He is tolerating this well and reports he is not back.  He does not want to change anything.  The patient continues to have clogged ears.  He has failed an intranasal corticosteroid.  He has also tried oral antihistamines, nasal decongestions, and other over-the-counter options.  The left side is worse than the right.  There is no pain, fevers, or drainage.  Past Medical History:  Diagnosis Date  . Anxiety   . Arthritis   . Blind loop syndrome   . Bulging discs    LOWER BACK  . Diverticulosis   . Family history of malignant neoplasm of gastrointestinal tract   . History of adenomatous polyp of colon   . History of Barrett's esophagus   . History of chronic gastritis   . Hyperlipidemia   . IBS (irritable bowel syndrome)   . Internal hemorrhoids   . Malignant neoplasm of prostate [facility]) urologist-  dr dahlstedt/  oncologist-  dr Tammi Klippel   dx 08-09-2017-- Stage T2a, Gleason 4+5, PSA 3.22 (on finateride)--- treatment ADT and external beam radiation  . PONV (postoperative nausea and vomiting)   . Seasonal allergic rhinitis   . Tubular adenoma of colon   . Wears glasses     Objective: BP 132/86 (BP Location: Left Arm, Patient Position: Sitting, Cuff Size: Normal)   Pulse (!) 111   Temp (!) 95.7 F (35.4 C) (Temporal)   Ht 5\' 11"  (1.803 m)   Wt 174 lb 2 oz (79 kg)   SpO2 95%   BMI 24.29 kg/m  General: Awake, appears stated age HEENT: Right ear is patent, TM is mildly retracted without fluid or erythema; left canal is also patent with moderate retraction and a small amount of serous fluid present; there is no erythema Lungs: No accessory muscle use Psych: Age appropriate judgment and insight, normal affect  and mood  Assessment and Plan: Dysfunction of both eustachian tubes - Plan: predniSONE (DELTASONE) 20 MG tablet  Situational anxiety - Plan: sertraline (ZOLOFT) 25 MG tablet  1-prednisone burst.  If no improvement in the next 3 days, he will send [place] a message and we will refer him to the ENT team, Dr. Laurance Flatten. 2-continue Zoloft to 25 mg daily, will send in the 25 mg tab so he does not have to cut them in half. Follow-up in 6 months or as needed. The patient voiced understanding and agreement to the plan.  [place], DO 09/05/19  11:39 AM

## 2019-09-05 NOTE — Patient Instructions (Signed)
Send me a message Friday morning if you are not doing better.  Let's stay on the same dosage for the sertraline.  Let us know if you need anything.

## 2019-09-07 NOTE — Progress Notes (Signed)
Counseling intern spoke to patient on 09/07/2019 for a telehealth counseling termination session via phone. Counseling intern provided space for pt to open up about experiences and responded with empathy, compassion, and normalization of feelings.   Pt reported that he is physically feeling better as a result of medication and time to heal. Pt reflected that he is now doing better with controlling his anger response and therefore decreasing the emotional impact of events that he finds upsetting. Pt reflected on the ups and downs he has experienced throughuot his life and expressed gratitude for these experiences. He stated that he wished he and his spouse had been more prepared for the emotional impact of his cancer treatment because at times he felt like a different person. CI normalized this response, but noted that the pt did not shy away from the hard times, instead just as he has done throughout his life he faced these moments with courage and found his way through.  Pt reported that overall counseling was helpful for him as it provided space for him to learn new coping strategies, to reflect about current and past events, and to find acceptance with the present. The CI expressed how it has been an honor to work with the pt. He stated that he is interested in talking with the new [facility] Counseling Intern in late August and welcomes a call to set up an appointment.   [name] [place] Counseling Intern Voicemail:  [phone]

## 2019-09-11 ENCOUNTER — Other Ambulatory Visit: Payer: Self-pay | Admitting: Family Medicine

## 2019-09-11 DIAGNOSIS — H938X2 Other specified disorders of left ear: Secondary | ICD-10-CM

## 2019-09-11 NOTE — Progress Notes (Signed)
am

## 2019-09-12 ENCOUNTER — Other Ambulatory Visit: Payer: Self-pay

## 2019-09-12 ENCOUNTER — Ambulatory Visit: Payer: Self-pay

## 2019-09-12 ENCOUNTER — Ambulatory Visit (INDEPENDENT_AMBULATORY_CARE_PROVIDER_SITE_OTHER): Payer: Medicare Other | Admitting: Family Medicine

## 2019-09-12 ENCOUNTER — Encounter: Payer: Self-pay | Admitting: Family Medicine

## 2019-09-12 VITALS — BP 119/84 | HR 79 | Ht 71.0 in | Wt 175.0 lb

## 2019-09-12 DIAGNOSIS — M65312 Trigger thumb, left thumb: Secondary | ICD-10-CM

## 2019-09-12 MED ORDER — TRIAMCINOLONE ACETONIDE 40 MG/ML IJ SUSP
40.0000 mg | Freq: Once | INTRAMUSCULAR | Status: AC
  Administered 2019-09-12: 40 mg via INTRA_ARTICULAR

## 2019-09-12 NOTE — Assessment & Plan Note (Signed)
Acutely worsening of trigger thumb. -Trigger thumb injection. -Counseled on splint. -Could consider surgery if needed.

## 2019-09-12 NOTE — Patient Instructions (Signed)
Good to see you Please use the splint at night for three weeks  Please use the voltaren   Please send me a message in MyChart with any questions or updates.  Please see me back in 4 weeks.   --Dr. Raeford Razor

## 2019-09-12 NOTE — Progress Notes (Signed)
Manuel Landry - 74 y.o. male MRN [id number]  Date of birth: 02/26/1946  SUBJECTIVE:  Including CC & ROS.  Chief Complaint  Patient presents with  . Follow-up    follow up for left thumb    Manuel Landry is a 74 y.o. male that is presenting with triggering of the left thumb.  He has had this occur previously.  He received an injection and it did well until about a month ago.  Seems to be worse in the morning.  Denies any injury in the interim.  No numbness or tingling.   Review of Systems See HPI   HISTORY: Past Medical, Surgical, Social, and Family History Reviewed & Updated per EMR.   Pertinent Historical Findings include:  Past Medical History:  Diagnosis Date  . Anxiety   . Arthritis   . Blind loop syndrome   . Bulging discs    LOWER BACK  . Diverticulosis   . Family history of malignant neoplasm of gastrointestinal tract   . History of adenomatous polyp of colon   . History of Barrett's esophagus   . History of chronic gastritis   . Hyperlipidemia   . IBS (irritable bowel syndrome)   . Internal hemorrhoids   . Malignant neoplasm of prostate [facility]) urologist-  dr dahlstedt/  oncologist-  dr Tammi Klippel   dx 08-09-2017-- Stage T2a, Gleason 4+5, PSA 3.22 (on finateride)--- treatment ADT and external beam radiation  . PONV (postoperative nausea and vomiting)   . Seasonal allergic rhinitis   . Tubular adenoma of colon   . Wears glasses     Past Surgical History:  Procedure Laterality Date  . COLONOSCOPY  last one 10-21-2016  dr pyrtle  . CYSTOSCOPY WITH INSERTION OF UROLIFT N/A 12/15/2017   Procedure: CYSTOSCOPY WITH INSERTION OF UROLIFT, BIOPSY  PROSTATIC URETHAL;  Surgeon: Franchot Gallo, MD;  Location: [facility];  Service: Urology;  Laterality: N/A;  . ESOPHAGOGASTRODUODENOSCOPY (EGD) WITH PROPOFOL  last one 09-26-2013   dr pyrtle  . INGUINAL HERNIA REPAIR Bilateral right 10-25-1997:  left 09-28-2008 (dr m. Hassell Done @ [facility])  . LAPAROSCOPIC  CHOLECYSTECTOMY  2012  . MANDIBLE SURGERY Bilateral 1990s   removal calcified bone growths  . ROTATOR CUFF REPAIR Right 03-26-2009   dr Tonita Cong  [facility]  . SHOULDER OPEN ROTATOR CUFF REPAIR  03/17/2012   Procedure: ROTATOR CUFF REPAIR SHOULDER OPEN;  Surgeon: Johnn Hai, MD;  Location: [facility];  Service: Orthopedics;  Laterality: Left;  LEFT MINI OPEN ROTATOR CUFF REPAIR   . SPACE OAR INSTILLATION N/A 12/15/2017   Procedure: SPACE OAR INSTILLATION;  Surgeon: Franchot Gallo, MD;  Location: [facility];  Service: Urology;  Laterality: N/A;  . TONSILLECTOMY  child    Family History  Problem Relation Age of Onset  . Colon cancer Mother 87  . Diabetes Maternal Grandmother   . COPD Sister   . Heart failure Father 21  . Esophageal cancer Neg Hx   . Rectal cancer Neg Hx   . Stomach cancer Neg Hx     Social History   Socioeconomic History  . Marital status: Married    Spouse name: Caren Griffins  . Number of children: 0  . Years of education: Not on file  . Highest education level: Not on file  Occupational History  . Occupation: Retired    Fish farm manager: RETIRED  Tobacco Use  . Smoking status: Never Smoker  . Smokeless tobacco: Never Used  Substance and Sexual Activity  . Alcohol  use: Yes    Comment: occasional  . Drug use: No  . Sexual activity: Yes    Comment: vasectomy 2011  Other Topics Concern  . Not on file  Social History Narrative  . Not on file   Social Determinants of Health   Financial Resource Strain:   . Difficulty of Paying Living Expenses:   Food Insecurity:   . Worried About Charity fundraiser in the Last Year:   . Arboriculturist in the Last Year:   Transportation Needs:   . Film/video editor (Medical):   Marland Kitchen Lack of Transportation (Non-Medical):   Physical Activity:   . Days of Exercise per Week:   . Minutes of Exercise per Session:   Stress:   . Feeling of Stress :   Social Connections:   . Frequency of Communication with Friends and  Family:   . Frequency of Social Gatherings with Friends and Family:   . Attends Religious Services:   . Active Member of Clubs or Organizations:   . Attends Archivist Meetings:   Marland Kitchen Marital Status:   Intimate Partner Violence:   . Fear of Current or Ex-Partner:   . Emotionally Abused:   Marland Kitchen Physically Abused:   . Sexually Abused:      PHYSICAL EXAM:  VS: BP 119/84   Pulse 79   Ht 5\' 11"  (1.803 m)   Wt 175 lb (79.4 kg)   BMI 24.41 kg/m  Physical Exam Gen: NAD, alert, cooperative with exam, well-appearing MSK:  Left thumb: Triggering of the thumb. No redness or swelling. No signs of atrophy. Neurovascularly intact   Aspiration/Injection Procedure Note Manuel Landry 1946-04-19  Procedure: Injection Indications: Left trigger thumb  Procedure Details Consent: Risks of procedure as well as the alternatives and risks of each were explained to the (patient/caregiver).  Consent for procedure obtained. Time Out: Verified patient identification, verified procedure, site/side was marked, verified correct patient position, special equipment/implants available, medications/allergies/relevent history reviewed, required imaging and test results available.  Performed.  The area was cleaned with iodine and alcohol swabs.    The left trigger thumb was injected using 1 cc's of 40 mg Kenalog and 1 cc's of 0.25% bupivacaine with a 25 1 1/2" needle.  Ultrasound was used. Images were obtained in long views showing the injection.     A sterile dressing was applied.  Patient did tolerate procedure well.      ASSESSMENT & PLAN:   Trigger finger of left thumb Acutely worsening of trigger thumb. -Trigger thumb injection. -Counseled on splint. -Could consider surgery if needed.

## 2019-09-20 DIAGNOSIS — H938X2 Other specified disorders of left ear: Secondary | ICD-10-CM | POA: Diagnosis not present

## 2019-09-20 DIAGNOSIS — J302 Other seasonal allergic rhinitis: Secondary | ICD-10-CM | POA: Diagnosis not present

## 2019-10-30 DIAGNOSIS — C61 Malignant neoplasm of prostate: Secondary | ICD-10-CM | POA: Diagnosis not present

## 2019-11-01 DIAGNOSIS — D2271 Melanocytic nevi of right lower limb, including hip: Secondary | ICD-10-CM | POA: Diagnosis not present

## 2019-11-01 DIAGNOSIS — I788 Other diseases of capillaries: Secondary | ICD-10-CM | POA: Diagnosis not present

## 2019-11-01 DIAGNOSIS — D2272 Melanocytic nevi of left lower limb, including hip: Secondary | ICD-10-CM | POA: Diagnosis not present

## 2019-11-01 DIAGNOSIS — L570 Actinic keratosis: Secondary | ICD-10-CM | POA: Diagnosis not present

## 2019-11-01 DIAGNOSIS — D1801 Hemangioma of skin and subcutaneous tissue: Secondary | ICD-10-CM | POA: Diagnosis not present

## 2019-11-01 DIAGNOSIS — L821 Other seborrheic keratosis: Secondary | ICD-10-CM | POA: Diagnosis not present

## 2019-11-01 DIAGNOSIS — D225 Melanocytic nevi of trunk: Secondary | ICD-10-CM | POA: Diagnosis not present

## 2019-11-01 DIAGNOSIS — L738 Other specified follicular disorders: Secondary | ICD-10-CM | POA: Diagnosis not present

## 2019-11-03 ENCOUNTER — Encounter: Payer: Self-pay | Admitting: Internal Medicine

## 2019-12-21 DIAGNOSIS — H5213 Myopia, bilateral: Secondary | ICD-10-CM | POA: Diagnosis not present

## 2019-12-21 DIAGNOSIS — H43813 Vitreous degeneration, bilateral: Secondary | ICD-10-CM | POA: Diagnosis not present

## 2019-12-21 DIAGNOSIS — H52223 Regular astigmatism, bilateral: Secondary | ICD-10-CM | POA: Diagnosis not present

## 2019-12-21 DIAGNOSIS — Z83511 Family history of glaucoma: Secondary | ICD-10-CM | POA: Diagnosis not present

## 2019-12-21 DIAGNOSIS — H524 Presbyopia: Secondary | ICD-10-CM | POA: Diagnosis not present

## 2019-12-21 DIAGNOSIS — H2513 Age-related nuclear cataract, bilateral: Secondary | ICD-10-CM | POA: Diagnosis not present

## 2019-12-25 ENCOUNTER — Other Ambulatory Visit: Payer: Self-pay

## 2019-12-25 ENCOUNTER — Ambulatory Visit (AMBULATORY_SURGERY_CENTER): Payer: Self-pay | Admitting: *Deleted

## 2019-12-25 VITALS — Ht 71.0 in | Wt 176.0 lb

## 2019-12-25 DIAGNOSIS — Z86010 Personal history of colonic polyps: Secondary | ICD-10-CM

## 2019-12-25 DIAGNOSIS — Z8719 Personal history of other diseases of the digestive system: Secondary | ICD-10-CM

## 2019-12-25 MED ORDER — SUTAB 1479-225-188 MG PO TABS: 1.0000 | ORAL_TABLET | Freq: Once | ORAL | 0 refills | Status: AC

## 2019-12-25 NOTE — Progress Notes (Signed)
Pt had PONV with oral surgery only  No other anesthesia issues, denies trouble with intubation, fam hx/hx of malignant hyperthermia  Completed covid vaccines 06-13-19  Pt is aware that care partner will wait in the car during procedure; if they feel like they will be too hot or cold to wait in the car; they may wait in the 4 th floor lobby. Patient is aware to bring only one care partner. We want them to wear a mask (we do not have any that we can provide them), practice social distancing, and we will check their temperatures when they get here.  I did remind the patient that their care partner needs to stay in the parking lot the entire time and have a cell phone available, we will call them when the pt is ready for discharge. Patient will wear mask into building.   No egg or soy allergy  No home oxygen use   No medications for weight loss taken   Sutab code put into RX and paper copy given to pt to show pharmacy

## 2020-01-09 ENCOUNTER — Other Ambulatory Visit: Payer: Self-pay

## 2020-01-09 ENCOUNTER — Encounter: Payer: Self-pay | Admitting: Internal Medicine

## 2020-01-09 ENCOUNTER — Ambulatory Visit (AMBULATORY_SURGERY_CENTER): Payer: Medicare Other | Admitting: Internal Medicine

## 2020-01-09 VITALS — BP 119/83 | HR 80 | Temp 97.8°F | Resp 13 | Ht 71.0 in | Wt 176.0 lb

## 2020-01-09 DIAGNOSIS — D123 Benign neoplasm of transverse colon: Secondary | ICD-10-CM | POA: Diagnosis not present

## 2020-01-09 DIAGNOSIS — K2990 Gastroduodenitis, unspecified, without bleeding: Secondary | ICD-10-CM

## 2020-01-09 DIAGNOSIS — K2270 Barrett's esophagus without dysplasia: Secondary | ICD-10-CM | POA: Diagnosis not present

## 2020-01-09 DIAGNOSIS — K2980 Duodenitis without bleeding: Secondary | ICD-10-CM

## 2020-01-09 DIAGNOSIS — K2970 Gastritis, unspecified, without bleeding: Secondary | ICD-10-CM | POA: Diagnosis not present

## 2020-01-09 DIAGNOSIS — K219 Gastro-esophageal reflux disease without esophagitis: Secondary | ICD-10-CM

## 2020-01-09 DIAGNOSIS — D124 Benign neoplasm of descending colon: Secondary | ICD-10-CM

## 2020-01-09 DIAGNOSIS — K635 Polyp of colon: Secondary | ICD-10-CM

## 2020-01-09 DIAGNOSIS — Z86010 Personal history of colonic polyps: Secondary | ICD-10-CM

## 2020-01-09 DIAGNOSIS — K319 Disease of stomach and duodenum, unspecified: Secondary | ICD-10-CM | POA: Diagnosis not present

## 2020-01-09 DIAGNOSIS — K259 Gastric ulcer, unspecified as acute or chronic, without hemorrhage or perforation: Secondary | ICD-10-CM | POA: Diagnosis not present

## 2020-01-09 MED ORDER — SODIUM CHLORIDE 0.9 % IV SOLN: 500.0000 mL | Freq: Once | INTRAVENOUS | Status: DC

## 2020-01-09 NOTE — Op Note (Signed)
[place] Patient Name: Manuel Landry Procedure Date: 01/09/2020 10:58 AM MRN: [id number] Endoscopist: Jerene Bears , MD Age: 74 Referring MD:  Date of Birth: 11/14/1945 Gender: Male Account #: [id number] Procedure:                Colonoscopy Indications:              High risk colon cancer surveillance: Personal                            history of colonic polyps (adenomas and sessile                            serrated polyps), Last colonoscopy: June 2018 Medicines:                Monitored Anesthesia Care Procedure:                Pre-Anesthesia Assessment:                           - Prior to the procedure, a History and Physical                            was performed, and patient medications and                            allergies were reviewed. The patient's tolerance of                            previous anesthesia was also reviewed. The risks                            and benefits of the procedure and the sedation                            options and risks were discussed with the patient.                            All questions were answered, and informed consent                            was obtained. Prior Anticoagulants: The patient has                            taken no previous anticoagulant or antiplatelet                            agents. ASA Grade Assessment: II - A patient with                            mild systemic disease. After reviewing the risks                            and benefits, the patient was deemed in  satisfactory condition to undergo the procedure.                           After obtaining informed consent, the colonoscope                            was passed under direct vision. Throughout the                            procedure, the patient's blood pressure, pulse, and                            oxygen saturations were monitored continuously. The                            Colonoscope was  introduced through the anus and                            advanced to the cecum, identified by appendiceal                            orifice and ileocecal valve. The colonoscopy was                            performed without difficulty. The patient tolerated                            the procedure well. The quality of the bowel                            preparation was good. The ileocecal valve,                            appendiceal orifice, and rectum were photographed. Scope In: 11:19:23 AM Scope Out: 11:35:56 AM Scope Withdrawal Time: 0 hours 13 minutes 9 seconds  Total Procedure Duration: 0 hours 16 minutes 33 seconds  Findings:                 The digital rectal exam was normal.                           A 7 mm polyp was found in the transverse colon. The                            polyp was sessile. The polyp was removed with a                            cold snare. Resection and retrieval were complete.                           A 8 mm polyp was found in the descending colon. The                            polyp was  flat. The polyp was removed with a cold                            snare. Resection and retrieval were complete. For                            hemostasis after polypectomy, one hemostatic clip                            was successfully placed. Bleeding had stopped at                            the end of the maneuver.                           Multiple small and large-mouthed diverticula were                            found in the sigmoid colon, descending colon and                            transverse colon.                           Internal hemorrhoids were found during                            retroflexion. The hemorrhoids were small. Complications:            No immediate complications. Estimated Blood Loss:     Estimated blood loss was minimal. Impression:               - One 7 mm polyp in the transverse colon, removed                            with  a cold snare. Resected and retrieved.                           - One 8 mm polyp in the descending colon, removed                            with a cold snare. Resected and retrieved. Clip was                            placed.                           - Diverticulosis in the sigmoid colon, in the                            descending colon and in the transverse colon.                           - Internal hemorrhoids. Recommendation:           - Patient has a contact  number available for                            emergencies. The signs and symptoms of potential                            delayed complications were discussed with the                            patient. Return to normal activities tomorrow.                            Written discharge instructions were provided to the                            patient.                           - Resume previous diet.                           - Continue present medications.                           - Await pathology results.                           - Repeat colonoscopy is recommended for                            surveillance. The colonoscopy date will be                            determined after pathology results from today's                            exam become available for review. Jerene Bears, MD 01/09/2020 11:45:26 AM This report has been signed electronically.

## 2020-01-09 NOTE — Patient Instructions (Signed)
Handouts on polys, diverticulosis, and gastritis given to you today  Await pathology results    YOU HAD AN ENDOSCOPIC PROCEDURE TODAY AT [facility]:   Refer to the procedure report that was given to you for any specific questions about what was found during the examination.  If the procedure report does not answer your questions, please call your gastroenterologist to clarify.  If you requested that your care partner not be given the details of your procedure findings, then the procedure report has been included in a sealed envelope for you to review at your convenience later.  YOU SHOULD EXPECT: Some feelings of bloating in the abdomen. Passage of more gas than usual.  Walking can help get rid of the air that was put into your GI tract during the procedure and reduce the bloating. If you had a lower endoscopy (such as a colonoscopy or flexible sigmoidoscopy) you may notice spotting of blood in your stool or on the toilet paper. If you underwent a bowel prep for your procedure, you may not have a normal bowel movement for a few days.  Please Note:  You might notice some irritation and congestion in your nose or some drainage.  This is from the oxygen used during your procedure.  There is no need for concern and it should clear up in a day or so.  SYMPTOMS TO REPORT IMMEDIATELY:   Following lower endoscopy (colonoscopy or flexible sigmoidoscopy):  Excessive amounts of blood in the stool  Significant tenderness or worsening of abdominal pains  Swelling of the abdomen that is new, acute  Fever of 100F or higher   Following upper endoscopy (EGD)  Vomiting of blood or coffee ground material  New chest pain or pain under the shoulder blades  Painful or persistently difficult swallowing  New shortness of breath  Fever of 100F or higher  Black, tarry-looking stools  For urgent or emergent issues, a gastroenterologist can be reached at any hour by calling [phone]. Do  not use MyChart messaging for urgent concerns.    DIET:  We do recommend a small meal at first, but then you may proceed to your regular diet.  Drink plenty of fluids but you should avoid alcoholic beverages for 24 hours.  ACTIVITY:  You should plan to take it easy for the rest of today and you should NOT DRIVE or use heavy machinery until tomorrow (because of the sedation medicines used during the test).    FOLLOW UP: Our staff will call the number listed on your records 48-72 hours following your procedure to check on you and address any questions or concerns that you may have regarding the information given to you following your procedure. If we do not reach you, we will leave a message.  We will attempt to reach you two times.  During this call, we will ask if you have developed any symptoms of COVID 19. If you develop any symptoms (ie: fever, flu-like symptoms, shortness of breath, cough etc.) before then, please call [phone].  If you test positive for Covid 19 in the 2 weeks post procedure, please call and report this information to [place].    If any biopsies were taken you will be contacted by phone or by letter within the next 1-3 weeks.  Please call us at [phone] if you have not heard about the biopsies in 3 weeks.    SIGNATURES/CONFIDENTIALITY: You and/or your care partner have signed paperwork which will be entered into  your electronic medical record.  These signatures attest to the fact that that the information above on your After Visit Summary has been reviewed and is understood.  Full responsibility of the confidentiality of this discharge information lies with you and/or your care-partner.

## 2020-01-09 NOTE — Progress Notes (Signed)
PT taken to PACU. Monitors in place. VSS. Report given to RN. 

## 2020-01-09 NOTE — Progress Notes (Signed)
Pt's states no medical or surgical changes since previsit or office visit. 

## 2020-01-09 NOTE — Op Note (Signed)
[place] Patient Name: Manuel Landry Procedure Date: 01/09/2020 10:59 AM MRN: [id number] Endoscopist: Jerene Bears , MD Age: 74 Referring MD:  Date of Birth: Sep 29, 1945 Gender: Male Account #: [id number] Procedure:                Upper GI endoscopy Indications:              Barrett's esophagus (history of Barrett's without                            dysplasia, though not present at last EGD in May                            2015) Medicines:                Monitored Anesthesia Care Procedure:                Pre-Anesthesia Assessment:                           - Prior to the procedure, a History and Physical                            was performed, and patient medications and                            allergies were reviewed. The patient's tolerance of                            previous anesthesia was also reviewed. The risks                            and benefits of the procedure and the sedation                            options and risks were discussed with the patient.                            All questions were answered, and informed consent                            was obtained. Prior Anticoagulants: The patient has                            taken no previous anticoagulant or antiplatelet                            agents. ASA Grade Assessment: II - A patient with                            mild systemic disease. After reviewing the risks                            and benefits, the patient was deemed in  satisfactory condition to undergo the procedure.                           After obtaining informed consent, the endoscope was                            passed under direct vision. Throughout the                            procedure, the patient's blood pressure, pulse, and                            oxygen saturations were monitored continuously. The                            Endoscope was introduced through the mouth, and                             advanced to the second part of duodenum. The upper                            GI endoscopy was accomplished without difficulty.                            The patient tolerated the procedure well. Scope In: Scope Out: Findings:                 LA Grade A (one or more mucosal breaks less than 5                            mm, not extending between tops of 2 mucosal folds)                            esophagitis with no bleeding was found at the                            gastroesophageal junction.                           There is no endoscopic evidence of Barrett's                            esophagus in the lower third of the esophagus.                           Moderate inflammation characterized by adherent                            blood, congestion (edema) and erythema was found in                            the gastric body and in the gastric antrum.  Biopsies were taken with a cold forceps for                            histology and Helicobacter pylori testing.                           Mild inflammation characterized by erythema was                            found in the duodenal bulb.                           The second portion of the duodenum was normal. Complications:            No immediate complications. Estimated Blood Loss:     Estimated blood loss was minimal. Impression:               - Mild reflux esophagitis with no bleeding. No                            evidence of Barrett's esophagus.                           - Gastritis. Biopsied.                           - Mild bulbar duodenitis.                           - Normal second portion of the duodenum. Recommendation:           - Patient has a contact number available for                            emergencies. The signs and symptoms of potential                            delayed complications were discussed with the                            patient. Return to  normal activities tomorrow.                            Written discharge instructions were provided to the                            patient.                           - Resume previous diet.                           - Continue present medications. Consider the                            addition of low dose omeprazole 20 mg daily (will  await biopsy results).                           - Await pathology results. Jerene Bears, MD 01/09/2020 11:42:00 AM This report has been signed electronically.

## 2020-01-11 ENCOUNTER — Telehealth: Payer: Self-pay | Admitting: *Deleted

## 2020-01-11 NOTE — Telephone Encounter (Signed)
  Follow up Call-  Call back number 01/09/2020  Post procedure Call Back phone  # [phone]  Permission to leave phone message Yes  Some recent data might be hidden     Patient questions:  Do you have a fever, pain , or abdominal swelling? No. Pain Score  0 *  Have you tolerated food without any problems? Yes.    Have you been able to return to your normal activities? Yes.    Do you have any questions about your discharge instructions: Diet   No. Medications  No. Follow up visit  No.  Do you have questions or concerns about your Care? No.  Actions: * If pain score is 4 or above: No action needed, pain <4.   1. Have you developed a fever since your procedure? no  2.   Have you had an respiratory symptoms (SOB or cough) since your procedure? no  3.   Have you tested positive for COVID 19 since your procedure no  4.   Have you had any family members/close contacts diagnosed with the COVID 19 since your procedure?  no   If yes to any of these questions please route to Joylene John, RN and Joella Prince, RN

## 2020-01-15 ENCOUNTER — Encounter: Payer: Self-pay | Admitting: Internal Medicine

## 2020-01-25 ENCOUNTER — Telehealth: Payer: Self-pay | Admitting: Internal Medicine

## 2020-01-26 NOTE — Telephone Encounter (Signed)
-   Continue present medications. Consider the addition of low dose omeprazole 20 mg  Spoke with pt and he knows to pick up the omeprazole 20mg  and take daily prior to breakfast.

## 2020-03-04 ENCOUNTER — Ambulatory Visit (INDEPENDENT_AMBULATORY_CARE_PROVIDER_SITE_OTHER): Payer: Medicare Other | Admitting: Family Medicine

## 2020-03-04 ENCOUNTER — Encounter: Payer: Self-pay | Admitting: Family Medicine

## 2020-03-04 ENCOUNTER — Other Ambulatory Visit: Payer: Self-pay

## 2020-03-04 VITALS — BP 120/82 | HR 105 | Temp 98.5°F | Ht 71.0 in | Wt 171.5 lb

## 2020-03-04 DIAGNOSIS — F411 Generalized anxiety disorder: Secondary | ICD-10-CM

## 2020-03-04 DIAGNOSIS — H9203 Otalgia, bilateral: Secondary | ICD-10-CM | POA: Diagnosis not present

## 2020-03-04 DIAGNOSIS — H6983 Other specified disorders of Eustachian tube, bilateral: Secondary | ICD-10-CM | POA: Diagnosis not present

## 2020-03-04 MED ORDER — PREDNISONE 20 MG PO TABS: 40.0000 mg | ORAL_TABLET | Freq: Every day | ORAL | 0 refills | Status: AC

## 2020-03-04 NOTE — Patient Instructions (Addendum)
Try to drink 55-60 oz of water daily.   If you do not hear anything about your referral in the next 1-2 weeks, call our office and ask for an update.  Continue on the nasal spray during seasons where ear pain/pressure occurs.   Let us know if you need anything.

## 2020-03-04 NOTE — Progress Notes (Signed)
Chief Complaint  Patient presents with  . Follow-up    Subjective Manuel Landry presents for f/u anxiety/depression.  Pt is currently being treated with Zoloft 25 mg and 50 mg alternating days as he feels this works best for him.  Reports doing well since treatment. No thoughts of harming self or others. No self-medication with alcohol, prescription drugs or illicit drugs. Pt is not following with a counselor/psychologist.  B/l ear pain for 2 weeks. Using Flonase, no relief for this. Saw ENT, offered a procedure to vent the TM. Declined at that time. No drainage, fevers, sinus issues.   Past Medical History:  Diagnosis Date  . Allergy   . Anxiety   . Arthritis   . Blind loop syndrome   . Bulging discs    LOWER BACK  . Cataract    "beginnings of"  . Diverticulosis   . Family history of malignant neoplasm of gastrointestinal tract   . History of adenomatous polyp of colon   . History of Barrett's esophagus   . History of chronic gastritis   . Hyperlipidemia    no meds taken  . IBS (irritable bowel syndrome)   . Internal hemorrhoids   . Malignant neoplasm of prostate [facility]) urologist-  dr dahlstedt/  oncologist-  dr Tammi Klippel   dx 08-09-2017-- Stage T2a, Gleason 4+5, PSA 3.22 (on finateride)--- treatment ADT and external beam radiation  . PONV (postoperative nausea and vomiting)   . Seasonal allergic rhinitis   . Tubular adenoma of colon   . Wears glasses    Allergies as of 03/04/2020   No Known Allergies     Medication List       Accurate as of March 04, 2020 12:06 PM. If you have any questions, ask your nurse or doctor.        STOP taking these medications   diphenoxylate-atropine 2.5-0.025 MG tablet Commonly known as: Lomotil Stopped by: [name], DO   MOVE FREE PO Stopped by: [name], DO   PHAZYME PO Stopped by: [name], DO     TAKE these medications   cetirizine 10 MG chewable tablet Commonly known  as: ZYRTEC Chew 10 mg by mouth daily. PRN   diclofenac Sodium 1 % Gel Commonly known as: VOLTAREN Apply 2 g topically 2 (two) times daily as needed.   FLORASTOR PO Take by mouth daily.   fluticasone 50 MCG/ACT nasal spray Commonly known as: FLONASE SPRAY 2 SPRAYS INTO EACH NOSTRIL EVERY DAY   ibuprofen 800 MG tablet Commonly known as: ADVIL TAKE 1 TABLET BY MOUTH EVERY 8 HOURS AS NEEDED   magnesium oxide 400 MG tablet Commonly known as: MAG-OX Take 400 mg by mouth daily.   NEURIVA PO Take by mouth daily.   Ocuvite Adult 50+ Caps Take 1 capsule by mouth daily.   predniSONE 20 MG tablet Commonly known as: DELTASONE Take 2 tablets (40 mg total) by mouth daily with breakfast for 5 days. Started by: [name], DO   PREVAGEN PO Take by mouth daily.   sertraline 25 MG tablet Commonly known as: ZOLOFT Take 1 tablet (25 mg total) by mouth daily.   tadalafil 5 MG tablet Commonly known as: CIALIS Take 5 mg by mouth every evening.   vitamin B-12 1000 MCG tablet Commonly known as: CYANOCOBALAMIN Take 1,000 mcg by mouth daily.       Exam BP 120/82 (BP Location: Left Arm, Patient Position: Sitting, Cuff Size: Normal)   Pulse (!) 105  Temp 98.5 F (36.9 C) (Oral)   Ht 5\' 11"  (1.803 m)   Wt 171 lb 8 oz (77.8 kg)   SpO2 96%   BMI 23.92 kg/m  General:  well developed, well nourished, in no apparent distress HEENT: Canals patent b/l, R TM neg, L TM slightly retracted without erythema or fluid.  Lungs:  No respiratory distress Psych: well oriented with normal range of affect and age-appropriate judgement/insight, alert and oriented x4.  Assessment and Plan  GAD (generalized anxiety disorder)  Acute ear pain, bilateral - Plan: predniSONE (DELTASONE) 20 MG tablet  Dysfunction of both eustachian tubes - Plan: Ambulatory referral to ENT  1. Cont Zoloft at current dosage. 2. 5 d pred burst at 40 mg/d.  3. Refer ENT, would prefer a different office.    F/u in 6 mo or prn. The patient voiced understanding and agreement to the plan.  [place], DO 03/04/20 12:06 PM

## 2020-03-08 DIAGNOSIS — C61 Malignant neoplasm of prostate: Secondary | ICD-10-CM | POA: Diagnosis not present

## 2020-03-15 DIAGNOSIS — R351 Nocturia: Secondary | ICD-10-CM | POA: Diagnosis not present

## 2020-03-15 DIAGNOSIS — Z8546 Personal history of malignant neoplasm of prostate: Secondary | ICD-10-CM | POA: Diagnosis not present

## 2020-03-15 DIAGNOSIS — N5201 Erectile dysfunction due to arterial insufficiency: Secondary | ICD-10-CM | POA: Diagnosis not present

## 2020-03-15 DIAGNOSIS — N401 Enlarged prostate with lower urinary tract symptoms: Secondary | ICD-10-CM | POA: Diagnosis not present

## 2020-03-22 ENCOUNTER — Encounter: Payer: Self-pay | Admitting: Family Medicine

## 2020-03-22 ENCOUNTER — Other Ambulatory Visit: Payer: Self-pay

## 2020-03-22 ENCOUNTER — Ambulatory Visit (INDEPENDENT_AMBULATORY_CARE_PROVIDER_SITE_OTHER): Payer: Medicare Other | Admitting: Family Medicine

## 2020-03-22 VITALS — BP 110/64 | HR 122 | Temp 98.3°F | Ht 71.0 in | Wt 178.1 lb

## 2020-03-22 DIAGNOSIS — S46812A Strain of other muscles, fascia and tendons at shoulder and upper arm level, left arm, initial encounter: Secondary | ICD-10-CM | POA: Diagnosis not present

## 2020-03-22 DIAGNOSIS — S0081XA Abrasion of other part of head, initial encounter: Secondary | ICD-10-CM | POA: Diagnosis not present

## 2020-03-22 DIAGNOSIS — R0781 Pleurodynia: Secondary | ICD-10-CM | POA: Diagnosis not present

## 2020-03-22 DIAGNOSIS — W19XXXA Unspecified fall, initial encounter: Secondary | ICD-10-CM

## 2020-03-22 MED ORDER — TRAMADOL HCL 50 MG PO TABS: 50.0000 mg | ORAL_TABLET | Freq: Three times a day (TID) | ORAL | 0 refills | Status: AC | PRN

## 2020-03-22 MED ORDER — MELOXICAM 15 MG PO TABS: 15.0000 mg | ORAL_TABLET | Freq: Every day | ORAL | 0 refills | Status: DC

## 2020-03-22 NOTE — Progress Notes (Signed)
Musculoskeletal Exam  Patient: Manuel Landry DOB: 1946/04/21  DOS: 03/22/2020  SUBJECTIVE:  Chief Complaint:   Chief Complaint  Patient presents with  . Fall    PAO HAFFEY is a 74 y.o.  male for evaluation and treatment of facial pain, shoulder pain and side pain.   Onset:  5 days ago. He stood up after eating a burger, applesauce and a martini. He had Benadryl after getting hiccups also. He fell and also fell later in the evening after he stood up. No falls or feelings of falling since then.   Location: face, R side, L upper shoulder/neck region Character:  aching and sharp  Progression of issue:  is unchanged Associated symptoms: bruising, scrapes on elbows and forehead, decreased ROM of neck slightly; no redness or swelling Treatment: to date has been rest, ice, OTC NSAIDS, muscle relaxers and rest.   Neurovascular symptoms: no  Past Medical History:  Diagnosis Date  . Allergy   . Anxiety   . Arthritis   . Blind loop syndrome   . Bulging discs    LOWER BACK  . Cataract    "beginnings of"  . Diverticulosis   . Family history of malignant neoplasm of gastrointestinal tract   . History of adenomatous polyp of colon   . History of Barrett's esophagus   . History of chronic gastritis   . Hyperlipidemia    no meds taken  . IBS (irritable bowel syndrome)   . Internal hemorrhoids   . Malignant neoplasm of prostate [facility]) urologist-  dr dahlstedt/  oncologist-  dr Tammi Klippel   dx 08-09-2017-- Stage T2a, Gleason 4+5, PSA 3.22 (on finateride)--- treatment ADT and external beam radiation  . PONV (postoperative nausea and vomiting)   . Seasonal allergic rhinitis   . Tubular adenoma of colon   . Wears glasses     Objective: VITAL SIGNS: BP 110/64 (BP Location: Left Arm, Patient Position: Sitting, Cuff Size: Normal)   Pulse (!) 122   Temp 98.3 F (36.8 C) (Oral)   Ht 5\' 11"  (1.803 m)   Wt 178 lb 2 oz (80.8 kg)   SpO2 96%   BMI 24.84 kg/m  Constitutional: Well  formed, well developed. No acute distress. Thorax & Lungs: No accessory muscle use Musculoskeletal: Shoulder.   Normal active range of motion: yes.   Normal passive range of motion: yes Tenderness to palpation: Yes over L trap Deformity: no Ecchymosis: no Mild ttp over the R lower and mid-upper rib cage laterally, there is ecchymosis but not erythema or crepitus Neurologic: Normal sensory function. No focal deficits noted. DTR's equal and symmetric in UE's. No clonus. Psychiatric: Normal mood. Age appropriate judgment and insight. Alert & oriented x 3.    Assessment:  Fall, initial encounter  Abrasion of face, initial encounter  Rib pain - Plan: traMADol (ULTRAM) 50 MG tablet, meloxicam (MOBIC) 15 MG tablet  Strain of left trapezius muscle, initial encounter - Plan: traMADol (ULTRAM) 50 MG tablet, meloxicam (MOBIC) 15 MG tablet  Plan: 1. I am slightly concerned in the nature in that he fell where he notes briefly losing consciousness. It has not happened since then so could have been related to Benadryl and the martini.  2. TAO bid.  No signs of infection. 3. No indication for XR. He is breathing normally, no signs of displacement. Mobic for baseline, tramadol for breakthrough.  4. Stretches/exercises, heat, ice, Tylenol.  F/u prn. The patient voiced understanding and agreement to the plan.   [employer]  Nolene Ebbs, DO 03/22/20  8:24 AM

## 2020-03-22 NOTE — Patient Instructions (Addendum)
Continue the triple antibiotic ointment on your face.   Ice/cold pack over area for 10-15 min twice daily.  Heat (pad or rice pillow in microwave) over affected area, 10-15 minutes twice daily.   Stay hydrated.   OK to take Tylenol 1000 mg (2 extra strength tabs) or 975 mg (3 regular strength tabs) every 6 hours as needed.   Do not drink alcohol, do any illicit/street drugs, drive or do anything that requires alertness while on this medicine.   Let us know if you need anything.   Trapezius stretches/exercises Do exercises exactly as told by your health care provider and adjust them as directed. It is normal to feel mild stretching, pulling, tightness, or discomfort as you do these exercises, but you should stop right away if you feel sudden pain or your pain gets worse.  Stretching and range of motion exercises These exercises warm up your muscles and joints and improve the movement and flexibility of your shoulder. These exercises can also help to relieve pain, numbness, and tingling. If you are unable to do any of the following for any reason, do not further attempt to do it.   Exercise A: Flexion, standing    1. Stand and hold a broomstick, a cane, or a similar object. Place your hands a little more than shoulder-width apart on the object. Your left / right hand should be palm-up, and your other hand should be palm-down. 2. Push the stick to raise your left / right arm out to your side and then over your head. Use your other hand to help move the stick. Stop when you feel a stretch in your shoulder, or when you reach the angle that is recommended by your health care provider. ? Avoid shrugging your shoulder while you raise your arm. Keep your shoulder blade tucked down toward your spine. 3. Hold for 30 seconds. 4. Slowly return to the starting position. Repeat 2 times. Complete this exercise 3 times per week.  Exercise B: Abduction, supine    1. Lie on your back and hold a  broomstick, a cane, or a similar object. Place your hands a little more than shoulder-width apart on the object. Your left / right hand should be palm-up, and your other hand should be palm-down. 2. Push the stick to raise your left / right arm out to your side and then over your head. Use your other hand to help move the stick. Stop when you feel a stretch in your shoulder, or when you reach the angle that is recommended by your health care provider. ? Avoid shrugging your shoulder while you raise your arm. Keep your shoulder blade tucked down toward your spine. 3. Hold for 30 seconds. 4. Slowly return to the starting position. Repeat 2 times. Complete this exercise 3 times per week.  Exercise C: Flexion, active-assisted    1. Lie on your back. You may bend your knees for comfort. 2. Hold a broomstick, a cane, or a similar object. Place your hands about shoulder-width apart on the object. Your palms should face toward your feet. 3. Raise the stick and move your arms over your head and behind your head, toward the floor. Use your healthy arm to help your left / right arm move farther. Stop when you feel a gentle stretch in your shoulder, or when you reach the angle where your health care provider tells you to stop. 4. Hold for 30 seconds. 5. Slowly return to the starting position. Repeat 2 times.  Complete this exercise 3 times per week.  Exercise D: External rotation and abduction    1. Stand in a door frame with one of your feet slightly in front of the other. This is called a staggered stance. 2. Choose one of the following positions as told by your health care provider: ? Place your hands and forearms on the door frame above your head. ? Place your hands and forearms on the door frame at the height of your head. ? Place your hands on the door frame at the height of your elbows. 3. Slowly move your weight onto your front foot until you feel a stretch across your chest and in the front of  your shoulders. Keep your head and chest upright and keep your abdominal muscles tight. 4. Hold for 30 seconds. 5. To release the stretch, shift your weight to your back foot. Repeat 2 times. Complete this stretch 3 times per week.  Strengthening exercises These exercises build strength and endurance in your shoulder. Endurance is the ability to use your muscles for a long time, even after your muscles get tired. Exercise E: Scapular depression and adduction  1. Sit on a stable chair. Support your arms in front of you with pillows, armrests, or a tabletop. Keep your elbows in line with the sides of your body. 2. Gently move your shoulder blades down toward your middle back. Relax the muscles on the tops of your shoulders and in the back of your neck. 3. Hold for 3 seconds. 4. Slowly release the tension and relax your muscles completely before doing this exercise again. Repeat for a total of 10 repetitions. 5. After you have practiced this exercise, try doing the exercise without the arm support. Then, try the exercise while standing instead of sitting. Repeat 2 times. Complete this exercise 3 times per week.  Exercise F: Shoulder abduction, isometric    1. Stand or sit about 4-6 inches (10-15 cm) from a wall with your left / right side facing the wall. 2. Bend your left / right elbow and gently press your elbow against the wall. 3. Increase the pressure slowly until you are pressing as hard as you can without shrugging your shoulder. 4. Hold for 3 seconds. 5. Slowly release the tension and relax your muscles completely. Repeat for a total of 10 repetitions. Repeat 2 times. Complete this exercise 3 times per week.  Exercise G: Shoulder flexion, isometric    1. Stand or sit about 4-6 inches (10-15 cm) away from a wall with your left / right side facing the wall. 2. Keep your left / right elbow straight and gently press the top of your fist against the wall. Increase the pressure slowly  until you are pressing as hard as you can without shrugging your shoulder. 3. Hold for 10-15 seconds. 4. Slowly release the tension and relax your muscles completely. Repeat for a total of 10 repetitions. Repeat 2 times. Complete this exercise 3 times per week.  Exercise H: Internal rotation    1. Sit in a stable chair without armrests, or stand. Secure an exercise band at your left / right side, at elbow height. 2. Place a soft object, such as a folded towel or a small pillow, under your left / right upper arm so your elbow is a few inches (about 8 cm) away from your side. 3. Hold the end of the exercise band so the band stretches. 4. Keeping your elbow pressed against the soft object under your  arm, move your forearm across your body toward your abdomen. Keep your body steady so the movement is only coming from your shoulder. 5. Hold for 3 seconds. 6. Slowly return to the starting position. Repeat for a total of 10 repetitions. Repeat 2 times. Complete this exercise 3 times per week.  Exercise I: External rotation    1. Sit in a stable chair without armrests, or stand. 2. Secure an exercise band at your left / right side, at elbow height. 3. Place a soft object, such as a folded towel or a small pillow, under your left / right upper arm so your elbow is a few inches (about 8 cm) away from your side. 4. Hold the end of the exercise band so the band stretches. 5. Keeping your elbow pressed against the soft object under your arm, move your forearm out, away from your abdomen. Keep your body steady so the movement is only coming from your shoulder. 6. Hold for 3 seconds. 7. Slowly return to the starting position. Repeat for a total of 10 repetitions. Repeat 2 times. Complete this exercise 3 times per week. Exercise J: Shoulder extension  1. Sit in a stable chair without armrests, or stand. Secure an exercise band to a stable object in front of you so the band is at shoulder  height. 2. Hold one end of the exercise band in each hand. Your palms should face each other. 3. Straighten your elbows and lift your hands up to shoulder height. 4. Step back, away from the secured end of the exercise band, until the band stretches. 5. Squeeze your shoulder blades together and pull your hands down to the sides of your thighs. Stop when your hands are straight down by your sides. Do not let your hands go behind your body. 6. Hold for 3 seconds. 7. Slowly return to the starting position. Repeat for a total of 10 repetitions. Repeat 2 times. Complete this exercise 3 times per week.  Exercise K: Shoulder extension, prone    1. Lie on your abdomen on a firm surface so your left / right arm hangs over the edge. 2. Hold a 5 lb weight in your hand so your palm faces in toward your body. Your arm should be straight. 3. Squeeze your shoulder blade down toward the middle of your back. 4. Slowly raise your arm behind you, up to the height of the surface that you are lying on. Keep your arm straight. 5. Hold for 3 seconds. 6. Slowly return to the starting position and relax your muscles. Repeat for a total of 10 repetitions. Repeat 2 times. Complete this exercise 3 times per week.   Exercise L: Horizontal abduction, prone  1. Lie on your abdomen on a firm surface so your left / right arm hangs over the edge. 2. Hold a 5 lb weight in your hand so your palm faces toward your feet. Your arm should be straight. 3. Squeeze your shoulder blade down toward the middle of your back. 4. Bend your elbow so your hand moves up, until your elbow is bent to an "L" shape (90 degrees). With your elbow bent, slowly move your forearm forward and up. Raise your hand up to the height of the surface that you are lying on. ? Your upper arm should not move, and your elbow should stay bent. ? At the top of the movement, your palm should face the floor. 5. Hold for 3 seconds. 6. Slowly return to the starting  position and relax your muscles. Repeat for a total of 10 repetitions. Repeat 2 times. Complete this exercise 3 times per week.  Exercise M: Horizontal abduction, standing  1. Sit on a stable chair, or stand. 2. Secure an exercise band to a stable object in front of you so the band is at shoulder height. 3. Hold one end of the exercise band in each hand. 4. Straighten your elbows and lift your hands straight in front of you, up to shoulder height. Your palms should face down, toward the floor. 5. Step back, away from the secured end of the exercise band, until the band stretches. 6. Move your arms out to your sides, and keep your arms straight. 7. Hold for 3 seconds. 8. Slowly return to the starting position. Repeat for a total of 10 repetitions. Repeat 2 times. Complete this exercise 3 times per week.  Exercise N: Scapular retraction and elevation  1. Sit on a stable chair, or stand. 2. Secure an exercise band to a stable object in front of you so the band is at shoulder height. 3. Hold one end of the exercise band in each hand. Your palms should face each other. 4. Sit in a stable chair without armrests, or stand. 5. Step back, away from the secured end of the exercise band, until the band stretches. 6. Squeeze your shoulder blades together and lift your hands over your head. Keep your elbows straight. 7. Hold for 3 seconds. 8. Slowly return to the starting position. Repeat for a total of 10 repetitions. Repeat 2 times. Complete this exercise 3 times per week.  This information is not intended to replace advice given to you by your health care provider. Make sure you discuss any questions you have with your health care provider. Document Released: 05/04/2005 Document Revised: 01/09/2016 Document Reviewed: 03/21/2015 Elsevier Interactive Patient Education  2017 [employer].

## 2020-04-02 ENCOUNTER — Encounter: Payer: Self-pay | Admitting: Family Medicine

## 2020-04-02 ENCOUNTER — Other Ambulatory Visit: Payer: Self-pay

## 2020-04-02 ENCOUNTER — Ambulatory Visit (INDEPENDENT_AMBULATORY_CARE_PROVIDER_SITE_OTHER): Payer: Medicare Other | Admitting: Family Medicine

## 2020-04-02 VITALS — BP 142/78 | HR 99 | Temp 99.0°F | Ht 71.0 in | Wt 176.1 lb

## 2020-04-02 DIAGNOSIS — S46812D Strain of other muscles, fascia and tendons at shoulder and upper arm level, left arm, subsequent encounter: Secondary | ICD-10-CM

## 2020-04-02 MED ORDER — IBUPROFEN 800 MG PO TABS: 800.0000 mg | ORAL_TABLET | Freq: Three times a day (TID) | ORAL | 0 refills | Status: DC | PRN

## 2020-04-02 MED ORDER — TIZANIDINE HCL 4 MG PO CAPS: 4.0000 mg | ORAL_CAPSULE | Freq: Three times a day (TID) | ORAL | 0 refills | Status: DC | PRN

## 2020-04-02 MED ORDER — METHYLPREDNISOLONE ACETATE 40 MG/ML IJ SUSP
20.0000 mg | Freq: Once | INTRAMUSCULAR | Status: AC
  Administered 2020-04-02: 20 mg via INTRA_ARTICULAR

## 2020-04-02 NOTE — Progress Notes (Signed)
Chief Complaint  Patient presents with   Neck Pain   Shoulder Pain    left shoulder    Subjective: Patient is a 74 y.o. male here for f/u shoulder/neck discomfort.  Started on Mobic 15 mg/d and still having pain over neck/shoulder region. He was taking 74 yr old Flexeril that wasn't helpful, but he is interested in a muscle relaxer. He has a knot and continued pain that is radiating further down the L shoulder region. No new inj or change in activity. He notices ibuprofen 800 mg helps more than Mobic. No neurologic s/s's.    Past Medical History:  Diagnosis Date   Allergy    Anxiety    Arthritis    Blind loop syndrome    Bulging discs    LOWER BACK   Cataract    "beginnings of"   Diverticulosis    Family history of malignant neoplasm of gastrointestinal tract    History of adenomatous polyp of colon    History of Barrett's esophagus    History of chronic gastritis    Hyperlipidemia    no meds taken   IBS (irritable bowel syndrome)    Internal hemorrhoids    Malignant neoplasm of prostate [facility]) urologist-  dr dahlstedt/  oncologist-  dr Tammi Klippel   dx 08-09-2017-- Stage T2a, Gleason 4+5, PSA 3.22 (on finateride)--- treatment ADT and external beam radiation   PONV (postoperative nausea and vomiting)    Seasonal allergic rhinitis    Tubular adenoma of colon    Wears glasses     Objective: BP (!) 142/78 (BP Location: Left Arm, Patient Position: Sitting, Cuff Size: Normal)    Pulse 99    Temp 99 F (37.2 C) (Oral)    Ht 5\' 11"  (1.803 m)    Wt 176 lb 2 oz (79.9 kg)    SpO2 96%    BMI 24.56 kg/m  General: Awake, appears stated age MSK: TTP over L trap with a knot and tender point in mid belly Lungs: No accessory muscle use Psych: Age appropriate judgment and insight, normal affect and mood  Procedure note; trigger point injection Verbal consent obtained. The area of interest were demarcated with an otoscope speculum tip. The area was then cleaned with  alcohol. 0.5 mL 2% lidocaine w/o epi and 20 mg depomedrol injected  The area was then bandaged. There were no complications noted. The patient tolerated the procedure well.  Assessment and Plan: Trapezius strain, left, subsequent encounter - Plan: ibuprofen (ADVIL) 800 MG tablet, tiZANidine (ZANAFLEX) 4 MG capsule, methylPREDNISolone acetate (DEPO-MEDROL) injection 20 mg  Trigger pt injection today. Ibuprofen. Tylenol. Stretches/exercises w heat. Add Zanaflex. The patient voiced understanding and agreement to the plan.  [place], DO 04/02/20  2:11 PM

## 2020-04-02 NOTE — Patient Instructions (Signed)
Heat (pad or rice pillow in microwave) over affected area, 10-15 minutes twice daily.   Continue the stretches/exercises.  Let me know if things aren't turning the corner.

## 2020-04-09 ENCOUNTER — Other Ambulatory Visit: Payer: Self-pay | Admitting: Family Medicine

## 2020-04-09 DIAGNOSIS — S46812D Strain of other muscles, fascia and tendons at shoulder and upper arm level, left arm, subsequent encounter: Secondary | ICD-10-CM

## 2020-04-09 MED ORDER — METHOCARBAMOL 500 MG PO TABS: 500.0000 mg | ORAL_TABLET | Freq: Three times a day (TID) | ORAL | 0 refills | Status: DC | PRN

## 2020-04-13 ENCOUNTER — Other Ambulatory Visit: Payer: Self-pay | Admitting: Family Medicine

## 2020-04-13 DIAGNOSIS — S46812A Strain of other muscles, fascia and tendons at shoulder and upper arm level, left arm, initial encounter: Secondary | ICD-10-CM

## 2020-04-13 DIAGNOSIS — R0781 Pleurodynia: Secondary | ICD-10-CM

## 2020-04-15 ENCOUNTER — Other Ambulatory Visit: Payer: Self-pay

## 2020-04-15 ENCOUNTER — Encounter: Payer: Self-pay | Admitting: Family Medicine

## 2020-04-15 ENCOUNTER — Ambulatory Visit (HOSPITAL_BASED_OUTPATIENT_CLINIC_OR_DEPARTMENT_OTHER)
Admission: RE | Admit: 2020-04-15 | Discharge: 2020-04-15 | Disposition: A | Discharge status: Home / Self Care | Payer: Medicare Other | Source: Ambulatory Visit | Attending: Family Medicine | Admitting: Family Medicine

## 2020-04-15 ENCOUNTER — Ambulatory Visit: Payer: Self-pay

## 2020-04-15 ENCOUNTER — Ambulatory Visit (INDEPENDENT_AMBULATORY_CARE_PROVIDER_SITE_OTHER): Payer: Medicare Other | Admitting: Family Medicine

## 2020-04-15 VITALS — BP 130/82 | HR 93 | Ht 71.0 in | Wt 176.0 lb

## 2020-04-15 DIAGNOSIS — M546 Pain in thoracic spine: Secondary | ICD-10-CM | POA: Diagnosis not present

## 2020-04-15 DIAGNOSIS — S42115A Nondisplaced fracture of body of scapula, left shoulder, initial encounter for closed fracture: Secondary | ICD-10-CM | POA: Diagnosis not present

## 2020-04-15 DIAGNOSIS — Z043 Encounter for examination and observation following other accident: Secondary | ICD-10-CM | POA: Diagnosis not present

## 2020-04-15 DIAGNOSIS — M25512 Pain in left shoulder: Secondary | ICD-10-CM

## 2020-04-15 NOTE — Patient Instructions (Signed)
Good to see you Please try the sling  Please alternate heat and ice  I will call with the results from today   Please send me a message in MyChart with any questions or updates.  Please see me back in 2-3 weeks.   --Dr. Raeford Razor

## 2020-04-15 NOTE — Assessment & Plan Note (Signed)
Injury occurred on 10/31.  Findings on exam and ultrasound to suggest a fracture of the scapula. -Counseled on home exercise therapy and supportive care. -Sling. -X-ray of the scapula, cervical spine and thoracic spine. -Could consider further imaging or physical therapy.

## 2020-04-15 NOTE — Progress Notes (Signed)
Manuel Landry - 74 y.o. male MRN [id number]  Date of birth: [birth year]  SUBJECTIVE:  Including CC & ROS.  Chief Complaint  Patient presents with  . Shoulder Pain    left x 03-17-20  . Neck Pain    03-17-20    Manuel Landry is a 74 y.o. male that is presenting with left scapular pain.  Pain has been ongoing since Halloween.  He has trouble reaching above his head.  He has pain along the scapular spine.  He has tried a trigger point injection.  He is taking Tylenol on a regular basis.  Has tried using a soft collar for his neck.   Review of Systems See HPI   HISTORY: Past Medical, Surgical, Social, and Family History Reviewed & Updated per EMR.   Pertinent Historical Findings include:  Past Medical History:  Diagnosis Date  . Allergy   . Anxiety   . Arthritis   . Blind loop syndrome   . Bulging discs    LOWER BACK  . Cataract    "beginnings of"  . Diverticulosis   . Family history of malignant neoplasm of gastrointestinal tract   . History of adenomatous polyp of colon   . History of Barrett's esophagus   . History of chronic gastritis   . Hyperlipidemia    no meds taken  . IBS (irritable bowel syndrome)   . Internal hemorrhoids   . Malignant neoplasm of prostate [facility]) urologist-  dr dahlstedt/  oncologist-  dr Tammi Klippel   dx 08-09-2017-- Stage T2a, Gleason 4+5, PSA 3.22 (on finateride)--- treatment ADT and external beam radiation  . PONV (postoperative nausea and vomiting)   . Seasonal allergic rhinitis   . Tubular adenoma of colon   . Wears glasses     Past Surgical History:  Procedure Laterality Date  . COLONOSCOPY  last one 10-21-2016  dr pyrtle  . CYSTOSCOPY WITH INSERTION OF UROLIFT N/A 12/15/2017   Procedure: CYSTOSCOPY WITH INSERTION OF UROLIFT, BIOPSY  PROSTATIC URETHAL;  Surgeon: Franchot Gallo, MD;  Location: [facility];  Service: Urology;  Laterality: N/A;  . ESOPHAGOGASTRODUODENOSCOPY (EGD) WITH PROPOFOL  last one 09-26-2013    dr pyrtle  . INGUINAL HERNIA REPAIR Bilateral right 10-25-1997:  left 09-28-2008 (dr m. Hassell Done @ [facility])  . LAPAROSCOPIC CHOLECYSTECTOMY  2012  . MANDIBLE SURGERY Bilateral 1990s   removal calcified bone growths  . ROTATOR CUFF REPAIR Right 03-26-2009   dr Tonita Cong  [facility]  . SHOULDER OPEN ROTATOR CUFF REPAIR  03/17/2012   Procedure: ROTATOR CUFF REPAIR SHOULDER OPEN;  Surgeon: Johnn Hai, MD;  Location: [facility];  Service: Orthopedics;  Laterality: Left;  LEFT MINI OPEN ROTATOR CUFF REPAIR   . SPACE OAR INSTILLATION N/A 12/15/2017   Procedure: SPACE OAR INSTILLATION;  Surgeon: Franchot Gallo, MD;  Location: [facility];  Service: Urology;  Laterality: N/A;  . TONSILLECTOMY  child  . UPPER GASTROINTESTINAL ENDOSCOPY      Family History  Problem Relation Age of Onset  . Colon cancer Mother 48  . Diabetes Maternal Grandmother   . COPD Sister   . Heart failure Father 33  . Esophageal cancer Neg Hx   . Rectal cancer Neg Hx   . Stomach cancer Neg Hx     Social History   Socioeconomic History  . Marital status: Married    Spouse name: Caren Griffins  . Number of children: 0  . Years of education: Not on file  . Highest education level:  Not on file  Occupational History  . Occupation: Retired    Fish farm manager: RETIRED  Tobacco Use  . Smoking status: Never Smoker  . Smokeless tobacco: Never Used  Vaping Use  . Vaping Use: Never used  Substance and Sexual Activity  . Alcohol use: Yes    Comment: occasional  . Drug use: No  . Sexual activity: Yes    Comment: vasectomy 2011  Other Topics Concern  . Not on file  Social History Narrative  . Not on file   Social Determinants of Health   Financial Resource Strain:   . Difficulty of Paying Living Expenses: Not on file  Food Insecurity:   . Worried About Charity fundraiser in the Last Year: Not on file  . Ran Out of Food in the Last Year: Not on file  Transportation Needs:   . Lack of Transportation (Medical): Not on  file  . Lack of Transportation (Non-Medical): Not on file  Physical Activity:   . Days of Exercise per Week: Not on file  . Minutes of Exercise per Session: Not on file  Stress:   . Feeling of Stress : Not on file  Social Connections:   . Frequency of Communication with Friends and Family: Not on file  . Frequency of Social Gatherings with Friends and Family: Not on file  . Attends Religious Services: Not on file  . Active Member of Clubs or Organizations: Not on file  . Attends Archivist Meetings: Not on file  . Marital Status: Not on file  Intimate Partner Violence:   . Fear of Current or Ex-Partner: Not on file  . Emotionally Abused: Not on file  . Physically Abused: Not on file  . Sexually Abused: Not on file     PHYSICAL EXAM:  VS: BP 130/82   Pulse 93   Ht 5\' 11"  (1.803 m)   Wt 176 lb (79.8 kg)   BMI 24.55 kg/m  Physical Exam Gen: NAD, alert, cooperative with exam, well-appearing MSK:  Left shoulder and back: Normal neck range of motion. No tenderness to palpation over the midline spine. Normal shoulder range of motion. Normal empty can testing. Tenderness to palpation over the scapular spine. No ecchymosis or swelling. Neurovascularly intact  Limited ultrasound: Left scapula:  No changes at the acromion. Supraspinatus with degenerative changes. The midportion of the body has an irregularity with increased hyperemia. Concern for possible fracture. No changes of the overlying soft tissue or musculature  Summary: Findings are suggestive of a scapular body fracture.  Ultrasound and interpretation by Clearance Coots, MD    ASSESSMENT & PLAN:   Closed nondisplaced fracture of body of left scapula Injury occurred on 10/31.  Findings on exam and ultrasound to suggest a fracture of the scapula. -Counseled on home exercise therapy and supportive care. -Sling. -X-ray of the scapula, cervical spine and thoracic spine. -Could consider further imaging  or physical therapy.

## 2020-04-16 ENCOUNTER — Telehealth: Payer: Self-pay | Admitting: Family Medicine

## 2020-04-16 NOTE — Telephone Encounter (Signed)
Informed of results.   Rosemarie Ax, MD [facility] 04/16/2020, 7:59 AM

## 2020-04-17 ENCOUNTER — Ambulatory Visit (INDEPENDENT_AMBULATORY_CARE_PROVIDER_SITE_OTHER): Payer: Medicare Other | Admitting: Otolaryngology

## 2020-04-17 ENCOUNTER — Encounter (INDEPENDENT_AMBULATORY_CARE_PROVIDER_SITE_OTHER): Payer: Self-pay | Admitting: Otolaryngology

## 2020-04-17 ENCOUNTER — Other Ambulatory Visit: Payer: Self-pay

## 2020-04-17 VITALS — Temp 97.5°F

## 2020-04-17 DIAGNOSIS — Z87898 Personal history of other specified conditions: Secondary | ICD-10-CM

## 2020-04-17 DIAGNOSIS — J310 Chronic rhinitis: Secondary | ICD-10-CM | POA: Diagnosis not present

## 2020-04-17 NOTE — Progress Notes (Signed)
HPI: Manuel Landry is a 74 y.o. male who presents is referred by his PCP for evaluation of left ear complaints.  He complains of the left ear feeling clogged at times.  He was treated with antibiotic and steroids and is doing better today.  He does not notice any significant hearing loss but feels pressure in the ear.  He also had an episode of vertigo around Halloween and fell hard on his face and has been having intermittent bloody mucus in the mornings from his nose but no active nosebleed.  He uses saline nasal rinses regularly.  He occasionally uses Flonase.  He has previously been diagnosed with eustachian tube dysfunction..  Past Medical History:  Diagnosis Date   Allergy    Anxiety    Arthritis    Blind loop syndrome    Bulging discs    LOWER BACK   Cataract    "beginnings of"   Diverticulosis    Family history of malignant neoplasm of gastrointestinal tract    History of adenomatous polyp of colon    History of Barrett's esophagus    History of chronic gastritis    Hyperlipidemia    no meds taken   IBS (irritable bowel syndrome)    Internal hemorrhoids    Malignant neoplasm of prostate [facility]) urologist-  dr dahlstedt/  oncologist-  dr Tammi Klippel   dx 08-09-2017-- Stage T2a, Gleason 4+5, PSA 3.22 (on finateride)--- treatment ADT and external beam radiation   PONV (postoperative nausea and vomiting)    Seasonal allergic rhinitis    Tubular adenoma of colon    Wears glasses    Past Surgical History:  Procedure Laterality Date   COLONOSCOPY  last one 10-21-2016  dr Hilarie Fredrickson   CYSTOSCOPY WITH INSERTION OF UROLIFT N/A 12/15/2017   Procedure: CYSTOSCOPY WITH INSERTION OF UROLIFT, BIOPSY  PROSTATIC URETHAL;  Surgeon: Franchot Gallo, MD;  Location: [facility];  Service: Urology;  Laterality: N/A;   ESOPHAGOGASTRODUODENOSCOPY (EGD) WITH PROPOFOL  last one 09-26-2013   dr pyrtle   INGUINAL HERNIA REPAIR Bilateral right 10-25-1997:  left  09-28-2008 (dr m. Hassell Done @ [facility])   LAPAROSCOPIC CHOLECYSTECTOMY  2012   MANDIBLE SURGERY Bilateral 1990s   removal calcified bone growths   ROTATOR CUFF REPAIR Right 03-26-2009   dr Tonita Cong  [facility]   SHOULDER OPEN ROTATOR CUFF REPAIR  03/17/2012   Procedure: ROTATOR CUFF REPAIR SHOULDER OPEN;  Surgeon: Johnn Hai, MD;  Location[facility];  Service: Orthopedics;  Laterality: Left;  LEFT MINI OPEN ROTATOR CUFF REPAIR    SPACE OAR INSTILLATION N/A 12/15/2017   Procedure: SPACE OAR INSTILLATION;  Surgeon: Franchot Gallo, MD;  Location: [facility];  Service: Urology;  Laterality: N/A;   TONSILLECTOMY  child   UPPER GASTROINTESTINAL ENDOSCOPY     Social History   Socioeconomic History   Marital status: Married    Spouse name: Caren Griffins   Number of children: 0   Years of education: Not on file   Highest education level: Not on file  Occupational History   Occupation: Retired    Fish farm manager: RETIRED  Tobacco Use   Smoking status: Never Smoker   Smokeless tobacco: Never Used  Scientific laboratory technician Use: Never used  Substance and Sexual Activity   Alcohol use: Yes    Comment: occasional   Drug use: No   Sexual activity: Yes    Comment: vasectomy 2011  Other Topics Concern   Not on file  Social History Narrative  Not on file   Social Determinants of Health   Financial Resource Strain:    Difficulty of Paying Living Expenses: Not on file  Food Insecurity:    Worried About [place] in the Last Year: Not on file   Ran Out of Food in the Last Year: Not on file  Transportation Needs:    Lack of Transportation (Medical): Not on file   Lack of Transportation (Non-Medical): Not on file  Physical Activity:    Days of Exercise per Week: Not on file   Minutes of Exercise per Session: Not on file  Stress:    Feeling of Stress : Not on file  Social Connections:    Frequency of Communication with Friends and Family: Not on file    Frequency of Social Gatherings with Friends and Family: Not on file   Attends Religious Services: Not on file   Active Member of Clubs or Organizations: Not on file   Attends Archivist Meetings: Not on file   Marital Status: Not on file   Family History  Problem Relation Age of Onset   Colon cancer Mother 70   Diabetes Maternal Grandmother    COPD Sister    Heart failure Father 68   Esophageal cancer Neg Hx    Rectal cancer Neg Hx    Stomach cancer Neg Hx    No Known Allergies Prior to Admission medications   Medication Sig Start Date End Date Taking? Authorizing Provider  Apoaequorin (PREVAGEN PO) Take by mouth daily.   Yes [provider]  cetirizine (ZYRTEC) 10 MG chewable tablet Chew 10 mg by mouth daily. PRN   Yes [provider]  diclofenac Sodium (VOLTAREN) 1 % GEL Apply 2 g topically 2 (two) times daily as needed. 09/09/19  Yes [provider]  fluticasone (FLONASE) 50 MCG/ACT nasal spray SPRAY 2 SPRAYS INTO EACH NOSTRIL EVERY DAY 08/28/19  Yes [name], DO  ibuprofen (ADVIL) 800 MG tablet Take 1 tablet (800 mg total) by mouth every 8 (eight) hours as needed. 04/02/20  Yes [name], DO  magnesium oxide (MAG-OX) 400 MG tablet Take 400 mg by mouth daily.    Yes [provider]  methocarbamol (ROBAXIN) 500 MG tablet Take 1 tablet (500 mg total) by mouth every 8 (eight) hours as needed for muscle spasms. 04/09/20  Yes Wendling, Crosby Oyster, DO  Misc Natural Products (NEURIVA PO) Take by mouth daily.   Yes [provider]  Multiple Vitamins-Minerals (OCUVITE ADULT 50+) CAPS Take 1 capsule by mouth daily.   Yes [provider]  sertraline (ZOLOFT) 25 MG tablet Take 1 tablet (25 mg total) by mouth daily. 09/05/19  Yes [name], DO  tadalafil (CIALIS) 5 MG tablet Take 5 mg by mouth every evening.    Yes [provider]  vitamin B-12 (CYANOCOBALAMIN) 1000 MCG  tablet Take 1,000 mcg by mouth daily.   Yes [provider]     Positive ROS: Otherwise negative  All other systems have been reviewed and were otherwise negative with the exception of those mentioned in the HPI and as above.  Physical Exam: Constitutional: Alert, well-appearing, no acute distress Ears: External ears without lesions or tenderness. Ear canals are clear bilaterally.  Of note he has mild bony exostosis in both ear canals from cold water swimming when he was younger.  The TMs are clear bilaterally with good mobility pneumatic otoscopy.  Hearing screening with a tuning forks revealed Weber  to be midline.  AC > BC bilaterally.  Subjectively he had symmetric hearing with mild upper frequency hearing loss in both ears. Nasal: External nose without lesions. Septum with minimal deformity.  Of note he does have some drying and crusting along the septum on both sides with a little bit of dried blood on the right side of the septum.  Nasal endoscopy was performed and on nasal endoscopy both middle meatus regions were clear.  The nasopharynx was clear.  Both eustachian tubes appeared clear with no obstruction..  Oral: Lips and gums without lesions. Tongue and palate mucosa without lesions. Posterior oropharynx clear. Neck: No palpable adenopathy or masses Respiratory: Breathing comfortably  Skin: No facial/neck lesions or rash noted.  Procedures  Assessment: Bloody nasal discharge most likely is related to the drying and crusting along the septum.  Otherwise nasal and sinus regions were clear on nasal endoscopy.  The nasopharynx likewise is clear. He has some mild sensorineural hearing loss. TMs are otherwise clear with no middle ear space abnormality noted or middle ear effusion.  Plan: Recommended use of nasal gel spray as this will help with drying and crusting within the nasal cavity and perhaps help with the bleeding.  He will follow-up here if he has any persistent  bleeding.  He can also use a humidifier at night. Briefly discussed with him that he does have mild hearing loss and might benefit by getting hearing test if he is having any trouble with his hearing.   Radene Journey, MD   CC:

## 2020-05-04 ENCOUNTER — Other Ambulatory Visit: Payer: Self-pay | Admitting: Family Medicine

## 2020-05-04 DIAGNOSIS — F418 Other specified anxiety disorders: Secondary | ICD-10-CM

## 2020-05-06 ENCOUNTER — Ambulatory Visit (INDEPENDENT_AMBULATORY_CARE_PROVIDER_SITE_OTHER): Payer: Medicare Other | Admitting: Family Medicine

## 2020-05-06 ENCOUNTER — Other Ambulatory Visit: Payer: Self-pay

## 2020-05-06 DIAGNOSIS — S42115D Nondisplaced fracture of body of scapula, left shoulder, subsequent encounter for fracture with routine healing: Secondary | ICD-10-CM

## 2020-05-06 NOTE — Assessment & Plan Note (Signed)
Initial injury was on 10/31.  Pain seems more worse at times.  He has trouble lifting up above his head.  The sling seem to make his pain worse as well. -Counseled on supportive care. -We will consider further imaging if pain is ongoing.

## 2020-05-06 NOTE — Progress Notes (Signed)
Manuel Landry - 74 y.o. male MRN [id number]  Date of birth: [birth year]  SUBJECTIVE:  Including CC & ROS.  No chief complaint on file.   Manuel Landry is a 74 y.o. male that is presenting with worsening and ongoing left scapular pain.  He feels like in the sling made the pain worse.  The pain is mostly posteriorly around the mid body of the scapular spine.   Review of Systems See HPI   HISTORY: Past Medical, Surgical, Social, and Family History Reviewed & Updated per EMR.   Pertinent Historical Findings include:  Past Medical History:  Diagnosis Date  . Allergy   . Anxiety   . Arthritis   . Blind loop syndrome   . Bulging discs    LOWER BACK  . Cataract    "beginnings of"  . Diverticulosis   . Family history of malignant neoplasm of gastrointestinal tract   . History of adenomatous polyp of colon   . History of Barrett's esophagus   . History of chronic gastritis   . Hyperlipidemia    no meds taken  . IBS (irritable bowel syndrome)   . Internal hemorrhoids   . Malignant neoplasm of prostate [facility]) urologist-  dr dahlstedt/  oncologist-  dr Tammi Klippel   dx 08-09-2017-- Stage T2a, Gleason 4+5, PSA 3.22 (on finateride)--- treatment ADT and external beam radiation  . PONV (postoperative nausea and vomiting)   . Seasonal allergic rhinitis   . Tubular adenoma of colon   . Wears glasses     Past Surgical History:  Procedure Laterality Date  . COLONOSCOPY  last one 10-21-2016  dr pyrtle  . CYSTOSCOPY WITH INSERTION OF UROLIFT N/A 12/15/2017   Procedure: CYSTOSCOPY WITH INSERTION OF UROLIFT, BIOPSY  PROSTATIC URETHAL;  Surgeon: Franchot Gallo, MD;  Location: [facility];  Service: Urology;  Laterality: N/A;  . ESOPHAGOGASTRODUODENOSCOPY (EGD) WITH PROPOFOL  last one 09-26-2013   dr pyrtle  . INGUINAL HERNIA REPAIR Bilateral right 10-25-1997:  left 09-28-2008 (dr m. Hassell Done @ [facility])  . LAPAROSCOPIC CHOLECYSTECTOMY  2012  . MANDIBLE SURGERY Bilateral 1990s    removal calcified bone growths  . ROTATOR CUFF REPAIR Right 03-26-2009   dr Tonita Cong  [facility]  . SHOULDER OPEN ROTATOR CUFF REPAIR  03/17/2012   Procedure: ROTATOR CUFF REPAIR SHOULDER OPEN;  Surgeon: Johnn Hai, MD;  Location[facility];  Service: Orthopedics;  Laterality: Left;  LEFT MINI OPEN ROTATOR CUFF REPAIR   . SPACE OAR INSTILLATION N/A 12/15/2017   Procedure: SPACE OAR INSTILLATION;  Surgeon: Franchot Gallo, MD;  Location: [facility];  Service: Urology;  Laterality: N/A;  . TONSILLECTOMY  child  . UPPER GASTROINTESTINAL ENDOSCOPY      Family History  Problem Relation Age of Onset  . Colon cancer Mother 5  . Diabetes Maternal Grandmother   . COPD Sister   . Heart failure Father 12  . Esophageal cancer Neg Hx   . Rectal cancer Neg Hx   . Stomach cancer Neg Hx     Social History   Socioeconomic History  . Marital status: Married    Spouse name: Caren Griffins  . Number of children: 0  . Years of education: Not on file  . Highest education level: Not on file  Occupational History  . Occupation: Retired    Fish farm manager: RETIRED  Tobacco Use  . Smoking status: Never Smoker  . Smokeless tobacco: Never Used  Vaping Use  . Vaping Use: Never used  Substance and  Sexual Activity  . Alcohol use: Yes    Comment: occasional  . Drug use: No  . Sexual activity: Yes    Comment: vasectomy 2011  Other Topics Concern  . Not on file  Social History Narrative  . Not on file   Social Determinants of Health   Financial Resource Strain: Not on file  Food Insecurity: Not on file  Transportation Needs: Not on file  Physical Activity: Not on file  Stress: Not on file  Social Connections: Not on file  Intimate Partner Violence: Not on file     PHYSICAL EXAM:  VS: BP 124/60   Ht 5\' 11"  (1.803 m)   Wt 176 lb (79.8 kg)   BMI 24.55 kg/m  Physical Exam Gen: NAD, alert, cooperative with exam, well-appearing   ASSESSMENT & PLAN:   Closed nondisplaced fracture of  body of left scapula Initial injury was on 10/31.  Pain seems more worse at times.  He has trouble lifting up above his head.  The sling seem to make his pain worse as well. -Counseled on supportive care. -We will consider further imaging if pain is ongoing.

## 2020-05-27 ENCOUNTER — Encounter: Payer: Self-pay | Admitting: Family Medicine

## 2020-05-27 ENCOUNTER — Ambulatory Visit (INDEPENDENT_AMBULATORY_CARE_PROVIDER_SITE_OTHER): Payer: Medicare Other | Admitting: Family Medicine

## 2020-05-27 ENCOUNTER — Other Ambulatory Visit: Payer: Self-pay

## 2020-05-27 DIAGNOSIS — S42115D Nondisplaced fracture of body of scapula, left shoulder, subsequent encounter for fracture with routine healing: Secondary | ICD-10-CM | POA: Diagnosis not present

## 2020-05-27 NOTE — Progress Notes (Signed)
Manuel Landry - 75 y.o. male MRN [id number]  Date of birth: 12-31-1945  SUBJECTIVE:  Including CC & ROS.  No chief complaint on file.   Manuel Landry is a 75 y.o. male that is following up for his left scapular fracture.  His symptoms have improved significantly.  He is regaining his strength back.   Review of Systems See HPI   HISTORY: Past Medical, Surgical, Social, and Family History Reviewed & Updated per EMR.   Pertinent Historical Findings include:  Past Medical History:  Diagnosis Date  . Allergy   . Anxiety   . Arthritis   . Blind loop syndrome   . Bulging discs    LOWER BACK  . Cataract    "beginnings of"  . Diverticulosis   . Family history of malignant neoplasm of gastrointestinal tract   . History of adenomatous polyp of colon   . History of Barrett's esophagus   . History of chronic gastritis   . Hyperlipidemia    no meds taken  . IBS (irritable bowel syndrome)   . Internal hemorrhoids   . Malignant neoplasm of prostate [facility]) urologist-  dr dahlstedt/  oncologist-  dr Tammi Klippel   dx 08-09-2017-- Stage T2a, Gleason 4+5, PSA 3.22 (on finateride)--- treatment ADT and external beam radiation  . PONV (postoperative nausea and vomiting)   . Seasonal allergic rhinitis   . Tubular adenoma of colon   . Wears glasses     Past Surgical History:  Procedure Laterality Date  . COLONOSCOPY  last one 10-21-2016  dr pyrtle  . CYSTOSCOPY WITH INSERTION OF UROLIFT N/A 12/15/2017   Procedure: CYSTOSCOPY WITH INSERTION OF UROLIFT, BIOPSY  PROSTATIC URETHAL;  Surgeon: Franchot Gallo, MD;  Location: [facility];  Service: Urology;  Laterality: N/A;  . ESOPHAGOGASTRODUODENOSCOPY (EGD) WITH PROPOFOL  last one 09-26-2013   dr pyrtle  . INGUINAL HERNIA REPAIR Bilateral right 10-25-1997:  left 09-28-2008 (dr m. Hassell Done @ [facility])  . LAPAROSCOPIC CHOLECYSTECTOMY  2012  . MANDIBLE SURGERY Bilateral 1990s   removal calcified bone growths  . ROTATOR CUFF REPAIR  Right 03-26-2009   dr Tonita Cong  [facility]  . SHOULDER OPEN ROTATOR CUFF REPAIR  03/17/2012   Procedure: ROTATOR CUFF REPAIR SHOULDER OPEN;  Surgeon: Johnn Hai, MD;  Location[facility];  Service: Orthopedics;  Laterality: Left;  LEFT MINI OPEN ROTATOR CUFF REPAIR   . SPACE OAR INSTILLATION N/A 12/15/2017   Procedure: SPACE OAR INSTILLATION;  Surgeon: Franchot Gallo, MD;  Location: [facility];  Service: Urology;  Laterality: N/A;  . TONSILLECTOMY  child  . UPPER GASTROINTESTINAL ENDOSCOPY      Family History  Problem Relation Age of Onset  . Colon cancer Mother 28  . Diabetes Maternal Grandmother   . COPD Sister   . Heart failure Father 38  . Esophageal cancer Neg Hx   . Rectal cancer Neg Hx   . Stomach cancer Neg Hx     Social History   Socioeconomic History  . Marital status: Married    Spouse name: Caren Griffins  . Number of children: 0  . Years of education: Not on file  . Highest education level: Not on file  Occupational History  . Occupation: Retired    Fish farm manager: RETIRED  Tobacco Use  . Smoking status: Never Smoker  . Smokeless tobacco: Never Used  Vaping Use  . Vaping Use: Never used  Substance and Sexual Activity  . Alcohol use: Yes    Comment: occasional  .  Drug use: No  . Sexual activity: Yes    Comment: vasectomy 2011  Other Topics Concern  . Not on file  Social History Narrative  . Not on file   Social Determinants of Health   Financial Resource Strain: Not on file  Food Insecurity: Not on file  Transportation Needs: Not on file  Physical Activity: Not on file  Stress: Not on file  Social Connections: Not on file  Intimate Partner Violence: Not on file     PHYSICAL EXAM:  VS: BP 120/70   Ht 5\' 11"  (1.803 m)   Wt 176 lb (79.8 kg)   BMI 24.55 kg/m  Physical Exam Gen: NAD, alert, cooperative with exam, well-appearing MSK:  Left shoulder: No tenderness over the scapula. Normal shoulder range of motion. Has good strength  resistance. Neurovascularly intact     ASSESSMENT & PLAN:   Closed nondisplaced fracture of body of left scapula Initial injury was 10/31.  Has had improvement of his pain and function. -Counseled on home exercise therapy and supportive care. -Could consider physical therapy. -Follow-up as needed.

## 2020-05-27 NOTE — Assessment & Plan Note (Signed)
Initial injury was 10/31.  Has had improvement of his pain and function. -Counseled on home exercise therapy and supportive care. -Could consider physical therapy. -Follow-up as needed.

## 2020-06-13 ENCOUNTER — Other Ambulatory Visit: Payer: Self-pay | Admitting: Family Medicine

## 2020-06-13 DIAGNOSIS — S46812A Strain of other muscles, fascia and tendons at shoulder and upper arm level, left arm, initial encounter: Secondary | ICD-10-CM

## 2020-06-13 DIAGNOSIS — R0781 Pleurodynia: Secondary | ICD-10-CM

## 2020-06-18 ENCOUNTER — Other Ambulatory Visit: Payer: Self-pay | Admitting: Family Medicine

## 2020-06-18 DIAGNOSIS — S46812D Strain of other muscles, fascia and tendons at shoulder and upper arm level, left arm, subsequent encounter: Secondary | ICD-10-CM

## 2020-06-18 DIAGNOSIS — S46812A Strain of other muscles, fascia and tendons at shoulder and upper arm level, left arm, initial encounter: Secondary | ICD-10-CM

## 2020-06-18 DIAGNOSIS — R0781 Pleurodynia: Secondary | ICD-10-CM

## 2020-06-19 ENCOUNTER — Ambulatory Visit: Payer: Medicare Other | Admitting: Family Medicine

## 2020-07-11 ENCOUNTER — Other Ambulatory Visit: Payer: Self-pay | Admitting: Family Medicine

## 2020-07-11 DIAGNOSIS — R0781 Pleurodynia: Secondary | ICD-10-CM

## 2020-07-11 DIAGNOSIS — S46812A Strain of other muscles, fascia and tendons at shoulder and upper arm level, left arm, initial encounter: Secondary | ICD-10-CM

## 2020-08-06 ENCOUNTER — Other Ambulatory Visit: Payer: Self-pay | Admitting: Family Medicine

## 2020-08-06 DIAGNOSIS — R0781 Pleurodynia: Secondary | ICD-10-CM

## 2020-08-06 DIAGNOSIS — S46812A Strain of other muscles, fascia and tendons at shoulder and upper arm level, left arm, initial encounter: Secondary | ICD-10-CM

## 2020-09-02 ENCOUNTER — Ambulatory Visit (INDEPENDENT_AMBULATORY_CARE_PROVIDER_SITE_OTHER): Payer: Medicare Other | Admitting: Family Medicine

## 2020-09-02 ENCOUNTER — Encounter: Payer: Self-pay | Admitting: Family Medicine

## 2020-09-02 ENCOUNTER — Other Ambulatory Visit: Payer: Self-pay

## 2020-09-02 VITALS — BP 122/82 | HR 87 | Temp 98.7°F | Ht 71.0 in | Wt 179.4 lb

## 2020-09-02 DIAGNOSIS — F411 Generalized anxiety disorder: Secondary | ICD-10-CM | POA: Diagnosis not present

## 2020-09-02 DIAGNOSIS — F418 Other specified anxiety disorders: Secondary | ICD-10-CM

## 2020-09-02 DIAGNOSIS — R252 Cramp and spasm: Secondary | ICD-10-CM

## 2020-09-02 DIAGNOSIS — E782 Mixed hyperlipidemia: Secondary | ICD-10-CM

## 2020-09-02 LAB — LIPID PANEL
Cholesterol: 206 mg/dL — ABNORMAL HIGH (ref 0–200)
HDL: 63.7 mg/dL (ref >39.00)
LDL Cholesterol: 119 mg/dL — ABNORMAL HIGH (ref 0–99)
NonHDL: 142.29
Total CHOL/HDL Ratio: 3
Triglycerides: 114 mg/dL (ref 0.0–149.0)
VLDL: 22.8 mg/dL (ref 0.0–40.0)

## 2020-09-02 LAB — COMPREHENSIVE METABOLIC PANEL
ALT: 10 U/L (ref 0–53)
AST: 15 U/L (ref 0–37)
Albumin: 4.3 g/dL (ref 3.5–5.2)
Alkaline Phosphatase: 45 U/L (ref 39–117)
BUN: 12 mg/dL (ref 6–23)
CO2: 30 mEq/L (ref 19–32)
Calcium: 9.5 mg/dL (ref 8.4–10.5)
Chloride: 99 mEq/L (ref 96–112)
Creatinine, Ser: 0.86 mg/dL (ref 0.40–1.50)
GFR: 84.99 mL/min (ref >60.00)
Glucose, Bld: 89 mg/dL (ref 70–99)
Potassium: 4.9 mEq/L (ref 3.5–5.1)
Sodium: 134 mEq/L — ABNORMAL LOW (ref 135–145)
Total Bilirubin: 0.5 mg/dL (ref 0.2–1.2)
Total Protein: 6.7 g/dL (ref 6.0–8.3)

## 2020-09-02 MED ORDER — SERTRALINE HCL 50 MG PO TABS: 50.0000 mg | ORAL_TABLET | Freq: Every day | ORAL | 2 refills | Status: DC

## 2020-09-02 MED ORDER — GABAPENTIN 300 MG PO CAPS: 300.0000 mg | ORAL_CAPSULE | Freq: Every day | ORAL | 2 refills | Status: DC

## 2020-09-02 NOTE — Patient Instructions (Signed)
Keep the diet clean and stay active.  Give us 2-3 business days to get the results of your labs back.   Let us know if you need anything.  

## 2020-09-02 NOTE — Progress Notes (Signed)
Chief Complaint  Patient presents with  . Follow-up    Subjective Manuel Landry presents for f/u anxiety.  Pt is currently being treated with Zoloft 50 mg/d.  Report doing well since treatment. No thoughts of harming self or others. No self-medication with alcohol, prescription drugs or illicit drugs. Pt is not following with a counselor/psychologist.  Hyperlipidemia Patient presents for dyslipidemia follow up. Currently being treated with none. Have been monitoring, mild elevations.   Nocturnal leg cramps Hx of cramps, took wife's gabapentin 300 mg at night and it helped. No AE's. Would like his own rx. He does stay well hydrated. No inj or change in activities.   Past Medical History:  Diagnosis Date  . Allergy   . Anxiety   . Arthritis   . Blind loop syndrome   . Bulging discs    LOWER BACK  . Cataract    "beginnings of"  . Diverticulosis   . Family history of malignant neoplasm of gastrointestinal tract   . History of adenomatous polyp of colon   . History of Barrett's esophagus   . History of chronic gastritis   . Hyperlipidemia    no meds taken  . IBS (irritable bowel syndrome)   . Internal hemorrhoids   . Malignant neoplasm of prostate [facility]) urologist-  dr dahlstedt/  oncologist-  dr Tammi Klippel   dx 08-09-2017-- Stage T2a, Gleason 4+5, PSA 3.22 (on finateride)--- treatment ADT and external beam radiation  . PONV (postoperative nausea and vomiting)   . Seasonal allergic rhinitis   . Tubular adenoma of colon   . Wears glasses    Allergies as of 09/02/2020   No Known Allergies     Medication List       Accurate as of September 02, 2020 10:37 AM. If you have any questions, ask your nurse or doctor.        STOP taking these medications   methocarbamol 500 MG tablet Commonly known as: Robaxin Stopped by: [name], DO     TAKE these medications   cetirizine 10 MG chewable tablet Commonly known as: ZYRTEC Chew 10 mg by mouth daily. PRN    diclofenac Sodium 1 % Gel Commonly known as: VOLTAREN Apply 2 g topically 2 (two) times daily as needed.   fluticasone 50 MCG/ACT nasal spray Commonly known as: FLONASE SPRAY 2 SPRAYS INTO EACH NOSTRIL EVERY DAY   gabapentin 300 MG capsule Commonly known as: NEURONTIN Take 1 capsule (300 mg total) by mouth at bedtime. Started by: [name], DO   ibuprofen 800 MG tablet Commonly known as: ADVIL TAKE 1 TABLET BY MOUTH EVERY 8 HOURS AS NEEDED   magnesium oxide 400 MG tablet Commonly known as: MAG-OX Take 400 mg by mouth daily.   meloxicam 15 MG tablet Commonly known as: MOBIC TAKE 1 TABLET (15 MG TOTAL) BY MOUTH DAILY.   NEURIVA PO Take by mouth daily.   Ocuvite Adult 50+ Caps Take 1 capsule by mouth daily.   PREVAGEN PO Take by mouth daily.   sertraline 50 MG tablet Commonly known as: ZOLOFT TAKE 1 TABLET (50 MG TOTAL) BY MOUTH DAILY. TAKE 1/2 TAB DAILY FOR FIRST 2 WEEKS.   sertraline 25 MG tablet Commonly known as: ZOLOFT TAKE 1 TABLET BY MOUTH EVERY DAY   tadalafil 5 MG tablet Commonly known as: CIALIS Take 5 mg by mouth every evening.   vitamin B-12 1000 MCG tablet Commonly known as: CYANOCOBALAMIN Take 1,000 mcg by mouth daily.  Exam BP 122/82 (BP Location: Left Arm, Patient Position: Sitting, Cuff Size: Normal)   Pulse 87   Temp 98.7 F (37.1 C) (Oral)   Ht 5\' 11"  (1.803 m)   Wt 179 lb 6 oz (81.4 kg)   SpO2 96%   BMI 25.02 kg/m  General:  well developed, well nourished, in no apparent distress Lungs:  No respiratory distress Psych: well oriented with normal range of affect and age-appropriate judgement/insight, alert and oriented x4.  Assessment and Plan  GAD (generalized anxiety disorder)  Mixed hyperlipidemia - Plan: Comprehensive metabolic panel, Lipid panel  Nocturnal muscle cramp - Plan: gabapentin (NEURONTIN) 300 MG capsule  1. Cont Zoloft 50 mg/d.  2. Ck labs. Counseled on diet/exercise. 3. Gabapentin 300 mg  qhs.  F/u in 6 mo. The patient voiced understanding and agreement to the plan.  [place], DO 09/02/20 10:37 AM

## 2020-09-04 DIAGNOSIS — Z8546 Personal history of malignant neoplasm of prostate: Secondary | ICD-10-CM | POA: Diagnosis not present

## 2020-09-10 ENCOUNTER — Ambulatory Visit: Payer: Self-pay

## 2020-09-10 ENCOUNTER — Other Ambulatory Visit: Payer: Self-pay

## 2020-09-10 ENCOUNTER — Encounter: Payer: Self-pay | Admitting: Family Medicine

## 2020-09-10 ENCOUNTER — Ambulatory Visit (INDEPENDENT_AMBULATORY_CARE_PROVIDER_SITE_OTHER): Payer: Medicare Other | Admitting: Family Medicine

## 2020-09-10 DIAGNOSIS — M65312 Trigger thumb, left thumb: Secondary | ICD-10-CM

## 2020-09-10 MED ORDER — TRIAMCINOLONE ACETONIDE 40 MG/ML IJ SUSP
40.0000 mg | Freq: Once | INTRAMUSCULAR | Status: AC
  Administered 2020-09-10: 40 mg via INTRA_ARTICULAR

## 2020-09-10 NOTE — Patient Instructions (Signed)
Good to see you Please try to splint the thumb   Please send me a message in MyChart with any questions or updates.  Please see me back in 4 weeks or as needed if better .   --Dr. Raeford Razor

## 2020-09-10 NOTE — Progress Notes (Signed)
NASIAH LEHENBAUER - 75 y.o. male MRN [id number]  Date of birth: Sep 15, 1945  SUBJECTIVE:  Including CC & ROS.  No chief complaint on file.   RAJVIR ERNSTER is a 75 y.o. male that is presenting with acute trigger thumb.    Review of Systems See HPI   HISTORY: Past Medical, Surgical, Social, and Family History Reviewed & Updated per EMR.   Pertinent Historical Findings include:  Past Medical History:  Diagnosis Date  . Allergy   . Anxiety   . Arthritis   . Blind loop syndrome   . Bulging discs    LOWER BACK  . Cataract    "beginnings of"  . Diverticulosis   . Family history of malignant neoplasm of gastrointestinal tract   . History of adenomatous polyp of colon   . History of Barrett's esophagus   . History of chronic gastritis   . Hyperlipidemia    no meds taken  . IBS (irritable bowel syndrome)   . Internal hemorrhoids   . Malignant neoplasm of prostate [facility]) urologist-  dr dahlstedt/  oncologist-  dr Tammi Klippel   dx 08-09-2017-- Stage T2a, Gleason 4+5, PSA 3.22 (on finateride)--- treatment ADT and external beam radiation  . PONV (postoperative nausea and vomiting)   . Seasonal allergic rhinitis   . Tubular adenoma of colon   . Wears glasses     Past Surgical History:  Procedure Laterality Date  . COLONOSCOPY  last one 10-21-2016  dr pyrtle  . CYSTOSCOPY WITH INSERTION OF UROLIFT N/A 12/15/2017   Procedure: CYSTOSCOPY WITH INSERTION OF UROLIFT, BIOPSY  PROSTATIC URETHAL;  Surgeon: Franchot Gallo, MD;  Location: [facility];  Service: Urology;  Laterality: N/A;  . ESOPHAGOGASTRODUODENOSCOPY (EGD) WITH PROPOFOL  last one 09-26-2013   dr pyrtle  . INGUINAL HERNIA REPAIR Bilateral right 10-25-1997:  left 09-28-2008 (dr m. Hassell Done @ [facility])  . LAPAROSCOPIC CHOLECYSTECTOMY  2012  . MANDIBLE SURGERY Bilateral 1990s   removal calcified bone growths  . ROTATOR CUFF REPAIR Right 03-26-2009   dr Tonita Cong  [facility]  . SHOULDER OPEN ROTATOR CUFF REPAIR  03/17/2012    Procedure: ROTATOR CUFF REPAIR SHOULDER OPEN;  Surgeon: Johnn Hai, MD;  Location: [facility];  Service: Orthopedics;  Laterality: Left;  LEFT MINI OPEN ROTATOR CUFF REPAIR   . SPACE OAR INSTILLATION N/A 12/15/2017   Procedure: SPACE OAR INSTILLATION;  Surgeon: Franchot Gallo, MD;  Location: [facility];  Service: Urology;  Laterality: N/A;  . TONSILLECTOMY  child  . UPPER GASTROINTESTINAL ENDOSCOPY      Family History  Problem Relation Age of Onset  . Colon cancer Mother 21  . Diabetes Maternal Grandmother   . COPD Sister   . Heart failure Father 75  . Esophageal cancer Neg Hx   . Rectal cancer Neg Hx   . Stomach cancer Neg Hx     Social History   Socioeconomic History  . Marital status: Married    Spouse name: Caren Griffins  . Number of children: 0  . Years of education: Not on file  . Highest education level: Not on file  Occupational History  . Occupation: Retired    Fish farm manager: RETIRED  Tobacco Use  . Smoking status: Never Smoker  . Smokeless tobacco: Never Used  Vaping Use  . Vaping Use: Never used  Substance and Sexual Activity  . Alcohol use: Yes    Comment: occasional  . Drug use: No  . Sexual activity: Yes    Comment: vasectomy  2011  Other Topics Concern  . Not on file  Social History Narrative  . Not on file   Social Determinants of Health   Financial Resource Strain: Not on file  Food Insecurity: Not on file  Transportation Needs: Not on file  Physical Activity: Not on file  Stress: Not on file  Social Connections: Not on file  Intimate Partner Violence: Not on file     PHYSICAL EXAM:  VS: BP 122/76 (BP Location: Left Arm, Patient Position: Sitting, Cuff Size: Normal)   Ht 5\' 11"  (1.803 m)   Wt 179 lb (81.2 kg)   BMI 24.97 kg/m  Physical Exam Gen: NAD, alert, cooperative with exam, well-appearing MSK:  Left thumb: Triggering on exam. no swelling or ecchymosis. Neurovascular intact  Limited ultrasound: Left thumb:  Nodule  appreciated of the flexor tendon.  Summary: Trigger thumb  Ultrasound and interpretation by Clearance Coots, MD   Aspiration/Injection Procedure Note ARISTON GRANDISON 14-Jan-1946  Procedure: Injection Indications: Left trigger thumb  Procedure Details Consent: Risks of procedure as well as the alternatives and risks of each were explained to the (patient/caregiver).  Consent for procedure obtained. Time Out: Verified patient identification, verified procedure, site/side was marked, verified correct patient position, special equipment/implants available, medications/allergies/relevent history reviewed, required imaging and test results available.  Performed.  The area was cleaned with iodine and alcohol swabs.    The left trigger thumb was injected using 1 cc's of 40 mg Kenalog and 1 cc's of 0.25% bupivacaine with a 25 1 1/2" needle.  Ultrasound was used. Images were obtained in long views showing the injection.     A sterile dressing was applied.  Patient did tolerate procedure well.    ASSESSMENT & PLAN:   Trigger finger of left thumb Acute exacerbation.  - counseled on supportive care  - injection.  - could consider release.

## 2020-09-10 NOTE — Assessment & Plan Note (Addendum)
Acute exacerbation.  - counseled on supportive care  - injection.  - could consider release.

## 2020-09-11 DIAGNOSIS — Z8546 Personal history of malignant neoplasm of prostate: Secondary | ICD-10-CM | POA: Diagnosis not present

## 2020-09-11 DIAGNOSIS — N5201 Erectile dysfunction due to arterial insufficiency: Secondary | ICD-10-CM | POA: Diagnosis not present

## 2020-10-30 ENCOUNTER — Other Ambulatory Visit: Payer: Self-pay | Admitting: Family Medicine

## 2020-11-04 DIAGNOSIS — D1801 Hemangioma of skin and subcutaneous tissue: Secondary | ICD-10-CM | POA: Diagnosis not present

## 2020-11-04 DIAGNOSIS — D225 Melanocytic nevi of trunk: Secondary | ICD-10-CM | POA: Diagnosis not present

## 2020-11-04 DIAGNOSIS — L821 Other seborrheic keratosis: Secondary | ICD-10-CM | POA: Diagnosis not present

## 2020-11-04 DIAGNOSIS — C44311 Basal cell carcinoma of skin of nose: Secondary | ICD-10-CM | POA: Diagnosis not present

## 2020-11-04 DIAGNOSIS — L570 Actinic keratosis: Secondary | ICD-10-CM | POA: Diagnosis not present

## 2020-11-04 DIAGNOSIS — L814 Other melanin hyperpigmentation: Secondary | ICD-10-CM | POA: Diagnosis not present

## 2020-11-04 DIAGNOSIS — D485 Neoplasm of uncertain behavior of skin: Secondary | ICD-10-CM | POA: Diagnosis not present

## 2020-11-19 DIAGNOSIS — C44311 Basal cell carcinoma of skin of nose: Secondary | ICD-10-CM | POA: Diagnosis not present

## 2020-11-19 DIAGNOSIS — Z85828 Personal history of other malignant neoplasm of skin: Secondary | ICD-10-CM | POA: Diagnosis not present

## 2020-12-11 ENCOUNTER — Other Ambulatory Visit: Payer: Self-pay

## 2020-12-11 ENCOUNTER — Emergency Department (HOSPITAL_COMMUNITY)
Admission: EM | Admit: 2020-12-11 | Discharge: 2020-12-12 | Disposition: A | Discharge status: Home / Self Care | Payer: Medicare Other | Attending: Emergency Medicine | Admitting: Emergency Medicine

## 2020-12-11 DIAGNOSIS — M545 Low back pain, unspecified: Secondary | ICD-10-CM | POA: Diagnosis not present

## 2020-12-11 DIAGNOSIS — W07XXXA Fall from chair, initial encounter: Secondary | ICD-10-CM | POA: Diagnosis not present

## 2020-12-11 DIAGNOSIS — Z8546 Personal history of malignant neoplasm of prostate: Secondary | ICD-10-CM | POA: Insufficient documentation

## 2020-12-11 DIAGNOSIS — R231 Pallor: Secondary | ICD-10-CM | POA: Diagnosis not present

## 2020-12-11 DIAGNOSIS — R42 Dizziness and giddiness: Secondary | ICD-10-CM | POA: Diagnosis not present

## 2020-12-11 DIAGNOSIS — R0902 Hypoxemia: Secondary | ICD-10-CM | POA: Diagnosis not present

## 2020-12-11 DIAGNOSIS — I959 Hypotension, unspecified: Secondary | ICD-10-CM | POA: Diagnosis not present

## 2020-12-11 DIAGNOSIS — R55 Syncope and collapse: Secondary | ICD-10-CM | POA: Diagnosis not present

## 2020-12-11 NOTE — ED Notes (Signed)
Room Air SpO2% 93%

## 2020-12-11 NOTE — ED Triage Notes (Addendum)
Pt BIB EMS from home c/o syncope episode. Per EMS pt report he passed out and fell in the floor. Per report pt fell on his side and no injury sustained. Per EMS BP 60/30 upon arrival. 1L NS Bolus given by EMS. 1 emesis reported.  Pt denies fever. Pt a/ox4.   BP 117/62 HR 62 RR 20 O2 96% on 2L CBG 145

## 2020-12-12 ENCOUNTER — Telehealth: Payer: Self-pay | Admitting: *Deleted

## 2020-12-12 DIAGNOSIS — R55 Syncope and collapse: Secondary | ICD-10-CM | POA: Diagnosis not present

## 2020-12-12 LAB — COMPREHENSIVE METABOLIC PANEL
ALT: 10 U/L (ref 0–44)
AST: 17 U/L (ref 15–41)
Albumin: 3.8 g/dL (ref 3.5–5.0)
Alkaline Phosphatase: 35 U/L — ABNORMAL LOW (ref 38–126)
Anion gap: 11 (ref 5–15)
BUN: 12 mg/dL (ref 8–23)
CO2: 20 mmol/L — ABNORMAL LOW (ref 22–32)
Calcium: 8.1 mg/dL — ABNORMAL LOW (ref 8.9–10.3)
Chloride: 102 mmol/L (ref 98–111)
Creatinine, Ser: 0.78 mg/dL (ref 0.61–1.24)
GFR, Estimated: >60 mL/min (ref >60)
Glucose, Bld: 110 mg/dL — ABNORMAL HIGH (ref 70–99)
Potassium: 3.6 mmol/L (ref 3.5–5.1)
Sodium: 133 mmol/L — ABNORMAL LOW (ref 135–145)
Total Bilirubin: 0.5 mg/dL (ref 0.3–1.2)
Total Protein: 6.1 g/dL — ABNORMAL LOW (ref 6.5–8.1)

## 2020-12-12 LAB — TROPONIN I (HIGH SENSITIVITY)
Troponin I (High Sensitivity): 3 ng/L (ref <18)
Troponin I (High Sensitivity): 3 ng/L (ref <18)

## 2020-12-12 LAB — CBC WITH DIFFERENTIAL/PLATELET
Abs Immature Granulocytes: 0.06 10*3/uL (ref 0.00–0.07)
Basophils Absolute: 0 10*3/uL (ref 0.0–0.1)
Basophils Relative: 1 %
Eosinophils Absolute: 0.2 10*3/uL (ref 0.0–0.5)
Eosinophils Relative: 4 %
HCT: 35.2 % — ABNORMAL LOW (ref 39.0–52.0)
Hemoglobin: 11.7 g/dL — ABNORMAL LOW (ref 13.0–17.0)
Immature Granulocytes: 1 %
Lymphocytes Relative: 11 %
Lymphs Abs: 0.6 10*3/uL — ABNORMAL LOW (ref 0.7–4.0)
MCH: 30.5 pg (ref 26.0–34.0)
MCHC: 33.2 g/dL (ref 30.0–36.0)
MCV: 91.7 fL (ref 80.0–100.0)
Monocytes Absolute: 0.6 10*3/uL (ref 0.1–1.0)
Monocytes Relative: 11 %
Neutro Abs: 4 10*3/uL (ref 1.7–7.7)
Neutrophils Relative %: 72 %
Platelets: 181 10*3/uL (ref 150–400)
RBC: 3.84 MIL/uL — ABNORMAL LOW (ref 4.22–5.81)
RDW: 13.6 % (ref 11.5–15.5)
WBC: 5.5 10*3/uL (ref 4.0–10.5)
nRBC: 0 % (ref 0.0–0.2)

## 2020-12-12 LAB — URINALYSIS, ROUTINE W REFLEX MICROSCOPIC
Bilirubin Urine: NEGATIVE
Glucose, UA: NEGATIVE mg/dL
Hgb urine dipstick: NEGATIVE
Ketones, ur: NEGATIVE mg/dL
Leukocytes,Ua: NEGATIVE
Nitrite: NEGATIVE
Protein, ur: NEGATIVE mg/dL
Specific Gravity, Urine: 1.01 (ref 1.005–1.030)
pH: 7 (ref 5.0–8.0)

## 2020-12-12 NOTE — Telephone Encounter (Signed)
Just FYI.

## 2020-12-12 NOTE — ED Notes (Signed)
Pt advised he is unable to void at this time, will monitor.

## 2020-12-12 NOTE — Telephone Encounter (Signed)
Who Is Calling Patient / Member / Family / Caregiver Caller Name Manuel Landry Caller Phone Number [phone] Patient Name Manuel Landry Patient DOB 06-14-1945 Call Type Message Only Information Provided Reason for Call Request for General Office Information Initial Comment Caller states she would like to leave a message for her husband's doctor, Dr. Nani Ravens. Pt was [facility] to [facility] ED by ambulance around 1100 pm for BP 70/42, passing out, sweating, V/D, and low O2 Sat. Additional Comment Provided caller with name of practice and doctor. Disp. Time Disposition Final User 12/11/2020 11:40:45 PM General Information Provided Yes Monyjang, Rudi

## 2020-12-12 NOTE — ED Provider Notes (Signed)
Manuel Landry   CSN: [id number] Arrival date & time: 12/11/20  2258     History Chief Complaint  Patient presents with   Loss of Consciousness    STEPHENSON Landry is a 75 y.o. male.  Patient presents to the emergency department for evaluation of a syncopal episode.  Patient reports that Manuel Landry has been sleeping in a chair for a while, got up to go to bed and felt very dizzy.  Manuel Landry then fell to the ground, is not sure if Manuel Landry completely blacked out or not.  Manuel Landry denies any injury.  No associated chest pain, heart palpitations, shortness of breath.  Patient reports that Manuel Landry feels tired now but the dizzy and weak symptoms have resolved.  Manuel Landry does report that Manuel Landry has been having increased low back pain after lifting a chair and therefore took a muscle relaxer and an extra gabapentin tonight, as well as his sleeping pill right before the episode occurred.      Past Medical History:  Diagnosis Date   Allergy    Anxiety    Arthritis    Blind loop syndrome    Bulging discs    LOWER BACK   Cataract    "beginnings of"   Diverticulosis    Family history of malignant neoplasm of gastrointestinal tract    History of adenomatous polyp of colon    History of Barrett's esophagus    History of chronic gastritis    Hyperlipidemia    no meds taken   IBS (irritable bowel syndrome)    Internal hemorrhoids    Malignant neoplasm of prostate [facility]) urologist-  Manuel Landry/  oncologist-  Manuel Landry   dx 08-09-2017-- Stage T2a, Gleason 4+5, PSA 3.22 (on finateride)--- treatment ADT and external beam radiation   PONV (postoperative nausea and vomiting)    Seasonal allergic rhinitis    Tubular adenoma of colon    Wears glasses     Patient Active Problem List   Diagnosis Date Noted   Nocturnal muscle cramp 09/02/2020   GAD (generalized anxiety disorder) 09/02/2020   Closed nondisplaced fracture of body of left scapula 04/15/2020   Situational anxiety  12/27/2018   Trigger finger of left thumb 11/17/2018   Malignant neoplasm of prostate ([place]) 09/06/2017   Hyperlipidemia 01/27/2017   Cardiovascular risk factor 08/12/2011   S/P cholecystectomy 06/22/2011   Personal history of colonic polyps 06/22/2011   CHOLELITHIASIS 02/13/2010   FLATULENCE-GAS-BLOATING 02/13/2010   CHOLELITHIASIS 02/11/2010   GERD 02/10/2010   DIVERTICULOSIS-COLON 02/10/2010   NAUSEA ALONE 02/10/2010   ABDOMINAL PAIN RIGHT UPPER QUADRANT 02/10/2010   ABDOMINAL PAIN, EPIGASTRIC 02/10/2010   ANEMIA-IRON DEFICIENCY 07/17/2008   INGUINAL HERNIA 07/17/2008   IRRITABLE BOWEL SYNDROME 07/17/2008   GASTRITIS 07/11/2008   DUODENITIS 07/11/2008   RENAL CYST, RIGHT 07/11/2008   DYSPEPSIA 06/12/2008   CONSTIPATION 05/31/2008   BACK PAIN, CHRONIC 05/31/2008   BENIGN PROSTATIC HYPERTROPHY, MILD, HX OF 05/31/2008    Past Surgical History:  Procedure Laterality Date   COLONOSCOPY  last one 10-21-2016  Manuel Manuel Landry   CYSTOSCOPY WITH INSERTION OF UROLIFT N/A 12/15/2017   Procedure: CYSTOSCOPY WITH INSERTION OF UROLIFT, BIOPSY  PROSTATIC URETHAL;  Surgeon: Manuel Gallo, MD;  Location: [place];  Service: Urology;  Laterality: N/A;   ESOPHAGOGASTRODUODENOSCOPY (EGD) WITH PROPOFOL  last one 09-26-2013   Manuel Manuel Landry   INGUINAL HERNIA REPAIR Bilateral right 10-25-1997:  left 09-28-2008 (Manuel Manuel Landry @ [facility])   LAPAROSCOPIC CHOLECYSTECTOMY  2012   MANDIBLE SURGERY Bilateral 1990s   removal calcified bone growths   ROTATOR CUFF REPAIR Right 03-26-2009   Manuel Manuel Landry  [facility]   SHOULDER OPEN ROTATOR CUFF REPAIR  03/17/2012   Procedure: ROTATOR CUFF REPAIR SHOULDER OPEN;  Surgeon: Manuel Hai, MD;  Location: [facility];  Service: Orthopedics;  Laterality: Left;  LEFT MINI OPEN ROTATOR CUFF REPAIR    SPACE OAR INSTILLATION N/A 12/15/2017   Procedure: SPACE OAR INSTILLATION;  Surgeon: Manuel Gallo, MD;  Location[facility];  Service: Urology;   Laterality: N/A;   TONSILLECTOMY  child   UPPER GASTROINTESTINAL ENDOSCOPY         Family History  Problem Relation Age of Onset   Colon cancer Mother 91   Diabetes Maternal Grandmother    COPD Sister    Heart failure Father 53   Esophageal cancer Neg Hx    Rectal cancer Neg Hx    Stomach cancer Neg Hx     Social History   Tobacco Use   Smoking status: Never   Smokeless tobacco: Never  Vaping Use   Vaping Use: Never used  Substance Use Topics   Alcohol use: Yes    Comment: occasional   Drug use: No    Home Medications Prior to Admission medications   Medication Sig Start Date End Date Taking? Authorizing Provider  Apoaequorin (PREVAGEN PO) Take by mouth daily.    [provider]  cetirizine (ZYRTEC) 10 MG chewable tablet Chew 10 mg by mouth daily. PRN    [provider]  diclofenac Sodium (VOLTAREN) 1 % GEL Apply 2 g topically 2 (two) times daily as needed. 09/09/19   [provider]  fluticasone (FLONASE) 50 MCG/ACT nasal spray SPRAY 2 SPRAYS INTO EACH NOSTRIL EVERY DAY 10/30/20   Manuel Pal, DO  gabapentin (NEURONTIN) 300 MG capsule Take 1 capsule (300 mg total) by mouth at bedtime. 09/02/20   Manuel Pal, DO  magnesium oxide (MAG-OX) 400 MG tablet Take 400 mg by mouth daily.     [provider]  meloxicam (MOBIC) 15 MG tablet TAKE 1 TABLET (15 MG TOTAL) BY MOUTH DAILY. 08/06/20   Manuel Pal, DO  Misc Natural Products (NEURIVA PO) Take by mouth daily.    [provider]  Multiple Vitamins-Minerals (OCUVITE ADULT 50+) CAPS Take 1 capsule by mouth daily.    [provider]  sertraline (ZOLOFT) 50 MG tablet Take 1 tablet (50 mg total) by mouth daily. 09/02/20   Manuel Pal, DO  tadalafil (CIALIS) 5 MG tablet Take 5 mg by mouth every evening.    [provider]  vitamin B-12 (CYANOCOBALAMIN) 1000 MCG tablet Take 1,000 mcg by mouth daily.    [provider]     Allergies    Patient has no known allergies.  Review of Systems   Review of Systems  Musculoskeletal:  Positive for back pain.  Neurological:  Positive for dizziness and syncope.  All other systems reviewed and are negative.  Physical Exam Updated Vital Signs BP 134/79   Pulse 66   Temp 97.9 F (36.6 C) (Oral)   Resp 11   SpO2 97%   Physical Exam Vitals and nursing Landry reviewed.  Constitutional:      General: Manuel Landry is not in acute distress.    Appearance: Normal appearance. Manuel Landry is well-developed.  HENT:     Head: Normocephalic and atraumatic.     Right Ear: Hearing normal.  Left Ear: Hearing normal.     Nose: Nose normal.  Eyes:     Conjunctiva/sclera: Conjunctivae normal.     Pupils: Pupils are equal, round, and reactive to light.  Cardiovascular:     Rate and Rhythm: Regular rhythm.     Heart sounds: S1 normal and S2 normal. No murmur heard.   No friction rub. No gallop.  Pulmonary:     Effort: Pulmonary effort is normal. No respiratory distress.     Breath sounds: Normal breath sounds.  Chest:     Chest wall: No tenderness.  Abdominal:     General: Bowel sounds are normal.     Palpations: Abdomen is soft.     Tenderness: There is no abdominal tenderness. There is no guarding or rebound. Negative signs include Murphy's sign and McBurney's sign.     Hernia: No hernia is present.  Musculoskeletal:        General: Normal range of motion.     Cervical back: Normal range of motion and neck supple.  Skin:    General: Skin is warm and dry.     Findings: No rash.  Neurological:     Mental Status: Manuel Landry is alert and oriented to person, place, and time.     GCS: GCS eye subscore is 4. GCS verbal subscore is 5. GCS motor subscore is 6.     Cranial Nerves: No cranial nerve deficit.     Sensory: No sensory deficit.     Coordination: Coordination normal.  Psychiatric:        Speech: Speech normal.        Behavior: Behavior normal.        Thought Content: Thought  content normal.    ED Results / Procedures / Treatments   Labs (all labs ordered are listed, but only abnormal results are displayed) Labs Reviewed  CBC WITH DIFFERENTIAL/PLATELET - Abnormal; Notable for the following components:      Result Value   RBC 3.84 (*)    Hemoglobin 11.7 (*)    HCT 35.2 (*)    Lymphs Abs 0.6 (*)    All other components within normal limits  COMPREHENSIVE METABOLIC PANEL - Abnormal; Notable for the following components:   Sodium 133 (*)    CO2 20 (*)    Glucose, Bld 110 (*)    Calcium 8.1 (*)    Total Protein 6.1 (*)    Alkaline Phosphatase 35 (*)    All other components within normal limits  URINALYSIS, ROUTINE W REFLEX MICROSCOPIC  TROPONIN I (HIGH SENSITIVITY)  TROPONIN I (HIGH SENSITIVITY)    EKG None  Radiology No results found.  Procedures Procedures   Medications Ordered in ED Medications - No data to display  ED Course  I have reviewed the triage vital signs and the nursing notes.  Pertinent labs & imaging results that were available during my care of the patient were reviewed by me and considered in my medical decision making (see chart for details).    MDM Rules/Calculators/A&P                           Patient presents to the emergency department for evaluation of a syncopal episode.  Patient did have some precursor symptoms of feeling lightheaded, dizzy and weak prior to briefly passing out.  Etiology is likely multifactorial.  Patient has been sleeping in a chair for a period of time prior to getting up to go to bed.  Syncopal episode occurred after standing up.  Additionally Manuel Landry took multiple sedating medications including an additional dose of Neurontin because of ongoing back problems.  Manuel Landry was hypotensive initially for EMS.  This has resolved, orthostatics are now normal after IV fluids.  Work-up is otherwise reassuring.  No further work-up necessary.  Final Clinical Impression(s) / ED Diagnoses Final diagnoses:  Syncope  and collapse    Rx / DC Orders ED Discharge Orders     None        Petrina Melby, Gwenyth Allegra, MD 12/12/20 [phone]

## 2020-12-17 ENCOUNTER — Other Ambulatory Visit: Payer: Self-pay | Admitting: Family Medicine

## 2020-12-17 ENCOUNTER — Encounter: Payer: Self-pay | Admitting: Family Medicine

## 2020-12-17 ENCOUNTER — Other Ambulatory Visit: Payer: Self-pay

## 2020-12-17 ENCOUNTER — Ambulatory Visit (INDEPENDENT_AMBULATORY_CARE_PROVIDER_SITE_OTHER): Payer: Medicare Other | Admitting: Family Medicine

## 2020-12-17 VITALS — BP 128/82 | HR 102 | Temp 98.4°F | Ht 71.0 in | Wt 177.2 lb

## 2020-12-17 DIAGNOSIS — E875 Hyperkalemia: Secondary | ICD-10-CM

## 2020-12-17 DIAGNOSIS — E871 Hypo-osmolality and hyponatremia: Secondary | ICD-10-CM

## 2020-12-17 DIAGNOSIS — R0681 Apnea, not elsewhere classified: Secondary | ICD-10-CM

## 2020-12-17 DIAGNOSIS — R55 Syncope and collapse: Secondary | ICD-10-CM | POA: Diagnosis not present

## 2020-12-17 LAB — COMPREHENSIVE METABOLIC PANEL
ALT: 11 U/L (ref 0–53)
AST: 16 U/L (ref 0–37)
Albumin: 4.5 g/dL (ref 3.5–5.2)
Alkaline Phosphatase: 45 U/L (ref 39–117)
BUN: 14 mg/dL (ref 6–23)
CO2: 28 mEq/L (ref 19–32)
Calcium: 9.5 mg/dL (ref 8.4–10.5)
Chloride: 94 mEq/L — ABNORMAL LOW (ref 96–112)
Creatinine, Ser: 0.88 mg/dL (ref 0.40–1.50)
GFR: 84.23 mL/min (ref >60.00)
Glucose, Bld: 89 mg/dL (ref 70–99)
Potassium: 5.5 mEq/L — ABNORMAL HIGH (ref 3.5–5.1)
Sodium: 129 mEq/L — ABNORMAL LOW (ref 135–145)
Total Bilirubin: 0.5 mg/dL (ref 0.2–1.2)
Total Protein: 7.1 g/dL (ref 6.0–8.3)

## 2020-12-17 LAB — CBC
HCT: 41 % (ref 39.0–52.0)
Hemoglobin: 14.2 g/dL (ref 13.0–17.0)
MCHC: 34.6 g/dL (ref 30.0–36.0)
MCV: 89.1 fl (ref 78.0–100.0)
Platelets: 237 10*3/uL (ref 150.0–400.0)
RBC: 4.6 Mil/uL (ref 4.22–5.81)
RDW: 13.9 % (ref 11.5–15.5)
WBC: 4.4 10*3/uL (ref 4.0–10.5)

## 2020-12-17 NOTE — Patient Instructions (Addendum)
Stay hydrated. I want your urine to be clear with a yellow tint.   Reach out to your heart doctor regarding this.  Let me know if you change your mind about seeing our sleep team.  Give [place] 2-3 business days to get the results of your labs back.   Keep the diet clean and stay active.  Let us know if you need anything.

## 2020-12-17 NOTE — Progress Notes (Signed)
Chief Complaint  Patient presents with   Hospitalization Follow-up    Subjective: Patient is a 75 y.o. male here for passing out. Here w spouse.   7/27 he was sleeping in a recliner. He got up after feeling dizzy and passed out in the kitchen. EMS arrived and he was found to have a BP of 40/20. He was sat on the toilet and vomited.  He went to the emergency department and was told he had been dehydrated, had polypharmacy from taking Zanaflex and an extra gabapentin.  The positional changes also thought to be involved.  Work-up was unremarkable.  Of note, his wife notes he stops breathing at night and has been doing so nearly nightly for the past 20 years.  The patient refuses a sleep study.  He has not noticed any fluttering in his chest prior to passing out.  He does not usually take more than 300 mg of gabapentin nightly.  Past Medical History:  Diagnosis Date   Allergy    Anxiety    Arthritis    Blind loop syndrome    Bulging discs    LOWER BACK   Cataract    "beginnings of"   Diverticulosis    Family history of malignant neoplasm of gastrointestinal tract    History of adenomatous polyp of colon    History of Barrett's esophagus    History of chronic gastritis    Hyperlipidemia    no meds taken   IBS (irritable bowel syndrome)    Internal hemorrhoids    Malignant neoplasm of prostate [facility]) urologist-  dr dahlstedt/  oncologist-  dr Tammi Klippel   dx 08-09-2017-- Stage T2a, Gleason 4+5, PSA 3.22 (on finateride)--- treatment ADT and external beam radiation   PONV (postoperative nausea and vomiting)    Seasonal allergic rhinitis    Tubular adenoma of colon    Wears glasses     Objective: BP 128/82   Pulse (!) 102   Temp 98.4 F (36.9 C) (Oral)   Ht '5\' 11"'$  (1.803 m)   Wt 177 lb 4 oz (80.4 kg)   SpO2 95%   BMI 24.72 kg/m  General: Awake, appears stated age HEENT: MMM, EOMi Heart: RRR, no murmurs, no bruits Lungs: CTAB, no rales, wheezes or rhonchi. No accessory muscle  use Neuro: DTRs equal and symmetric throughout, gait is normal, no cerebellar signs Psych: Age appropriate judgment and insight, normal affect and mood  Assessment and Plan: Syncope, unspecified syncope type - Plan: CBC, Comprehensive metabolic panel  Witnessed apneic spells  New problem, uncertain prognosis.  I would like him to follow-up with his cardiologist if labs are normal.  Given his likely untreated sleep apnea, this could preclude him to an arrhythmia causing this issue.  I do not want him to take more than 300 mg of gabapentin nightly.  Stay hydrated.  Goal is for urine to have a clear overall appearance with a yellow tent. The patient and his wife voiced understanding and agreement to the plan.  [place], DO 12/17/20  1:42 PM

## 2020-12-18 ENCOUNTER — Other Ambulatory Visit (INDEPENDENT_AMBULATORY_CARE_PROVIDER_SITE_OTHER): Payer: Medicare Other

## 2020-12-18 DIAGNOSIS — E875 Hyperkalemia: Secondary | ICD-10-CM | POA: Diagnosis not present

## 2020-12-18 DIAGNOSIS — E871 Hypo-osmolality and hyponatremia: Secondary | ICD-10-CM | POA: Diagnosis not present

## 2020-12-18 LAB — BASIC METABOLIC PANEL
BUN: 15 mg/dL (ref 6–23)
CO2: 24 mEq/L (ref 19–32)
Calcium: 9.1 mg/dL (ref 8.4–10.5)
Chloride: 95 mEq/L — ABNORMAL LOW (ref 96–112)
Creatinine, Ser: 0.91 mg/dL (ref 0.40–1.50)
GFR: 82.55 mL/min (ref >60.00)
Glucose, Bld: 104 mg/dL — ABNORMAL HIGH (ref 70–99)
Potassium: 5 mEq/L (ref 3.5–5.1)
Sodium: 128 mEq/L — ABNORMAL LOW (ref 135–145)

## 2020-12-18 LAB — CORTISOL: Cortisol, Plasma: 8.3 ug/dL

## 2020-12-22 LAB — ACTH: C206 ACTH: 7 pg/mL (ref 6–50)

## 2020-12-24 LAB — OSMOLALITY

## 2020-12-24 LAB — OSMOLALITY, URINE: Osmolality, Ur: 496 mOsm/kg (ref 50–1200)

## 2020-12-24 LAB — SODIUM, URINE, RANDOM: Sodium, Ur: 44 mmol/L (ref 28–272)

## 2020-12-26 DIAGNOSIS — H18593 Other hereditary corneal dystrophies, bilateral: Secondary | ICD-10-CM | POA: Diagnosis not present

## 2020-12-26 DIAGNOSIS — Z83511 Family history of glaucoma: Secondary | ICD-10-CM | POA: Diagnosis not present

## 2020-12-26 DIAGNOSIS — H25813 Combined forms of age-related cataract, bilateral: Secondary | ICD-10-CM | POA: Diagnosis not present

## 2020-12-26 DIAGNOSIS — H43813 Vitreous degeneration, bilateral: Secondary | ICD-10-CM | POA: Diagnosis not present

## 2021-01-08 ENCOUNTER — Ambulatory Visit (INDEPENDENT_AMBULATORY_CARE_PROVIDER_SITE_OTHER): Payer: Medicare Other

## 2021-01-08 VITALS — Ht 71.0 in | Wt 177.0 lb

## 2021-01-08 DIAGNOSIS — Z0000 Encounter for general adult medical examination without abnormal findings: Secondary | ICD-10-CM

## 2021-01-08 NOTE — Progress Notes (Signed)
Subjective:   Manuel Landry is a 75 y.o. male who presents for Medicare Annual/Subsequent preventive examination.  I connected with Saje today by telephone and verified that I am speaking with the correct person using two identifiers. Location patient: home Location provider: work Persons participating in the virtual visit: patient, Marine scientist.    I discussed the limitations, risks, security and privacy concerns of performing an evaluation and management service by telephone and the availability of in person appointments. I also discussed with the patient that there may be a patient responsible charge related to this service. The patient expressed understanding and verbally consented to this telephonic visit.    Interactive audio and video telecommunications were attempted between this provider and patient, however failed, due to patient having technical difficulties OR patient did not have access to video capability.  We continued and completed visit with audio only.  Some vital signs may be absent or patient reported.   Time Spent with patient on telephone encounter: 20 minutes   Review of Systems     Cardiac Risk Factors include: advanced age (>19mn, >>26women);male gender;dyslipidemia     Objective:    Today's Vitals   01/08/21 0944  Weight: 177 lb (80.3 kg)  Height: '5\' 11"'$  (1.803 m)   Body mass index is 24.69 kg/m.  Advanced Directives 01/08/2021 12/11/2020 05/09/2019 03/30/2019 04/08/2018 12/15/2017 12/02/2017  Does Patient Have a Medical Advance Directive? Yes No Yes Yes Yes Yes Yes  Type of AParamedicof AAllardtLiving will - HThree OaksLiving will H[place]ronLiving will Living will HHar[place]Living will HMaynardLiving will  Does patient want to make changes to medical advance directive? - - No - Patient declined No - Patient declined - - No - Patient declined  Copy of  HWakemanin Chart? Yes - validated most recent copy scanned in chart (See row information) - Yes - validated most recent copy scanned in chart (See row information) Yes - validated most recent copy scanned in chart (See row information) - No - copy requested -    Current Medications (verified) Outpatient Encounter Medications as of 01/08/2021  Medication Sig   Apoaequorin (PREVAGEN PO) Take by mouth daily.   cetirizine (ZYRTEC) 10 MG chewable tablet Chew 10 mg by mouth daily. PRN   diclofenac Sodium (VOLTAREN) 1 % GEL Apply 2 g topically 2 (two) times daily as needed.   fluticasone (FLONASE) 50 MCG/ACT nasal spray SPRAY 2 SPRAYS INTO EACH NOSTRIL EVERY DAY   gabapentin (NEURONTIN) 300 MG capsule Take 1 capsule (300 mg total) by mouth at bedtime.   magnesium oxide (MAG-OX) 400 MG tablet Take 400 mg by mouth daily.    Misc Natural Products (NEURIVA PO) Take by mouth daily.   Multiple Vitamins-Minerals (OCUVITE ADULT 50+) CAPS Take 1 capsule by mouth daily.   sertraline (ZOLOFT) 50 MG tablet Take 1 tablet (50 mg total) by mouth daily.   tadalafil (CIALIS) 5 MG tablet Take 5 mg by mouth every evening.   vitamin B-12 (CYANOCOBALAMIN) 1000 MCG tablet Take 1,000 mcg by mouth daily.   No facility-administered encounter medications on file as of 01/08/2021.    Allergies (verified) Patient has no known allergies.   History: Past Medical History:  Diagnosis Date   Allergy    Anxiety    Arthritis    Blind loop syndrome    Bulging discs    LOWER BACK   Cataract    "  beginnings of"   Diverticulosis    Family history of malignant neoplasm of gastrointestinal tract    History of adenomatous polyp of colon    History of Barrett's esophagus    History of chronic gastritis    Hyperlipidemia    no meds taken   IBS (irritable bowel syndrome)    Internal hemorrhoids    Malignant neoplasm of prostate [facility]) urologist-  dr dahlstedt/  oncologist-  dr Tammi Klippel   dx 08-09-2017--  Stage T2a, Gleason 4+5, PSA 3.22 (on finateride)--- treatment ADT and external beam radiation   PONV (postoperative nausea and vomiting)    Seasonal allergic rhinitis    Tubular adenoma of colon    Wears glasses    Past Surgical History:  Procedure Laterality Date   COLONOSCOPY  last one 10-21-2016  dr Hilarie Fredrickson   CYSTOSCOPY WITH INSERTION OF UROLIFT N/A 12/15/2017   Procedure: CYSTOSCOPY WITH INSERTION OF UROLIFT, BIOPSY  PROSTATIC URETHAL;  Surgeon: Franchot Gallo, MD;  Location[facility];  Service: Urology;  Laterality: N/A;   ESOPHAGOGASTRODUODENOSCOPY (EGD) WITH PROPOFOL  last one 09-26-2013   dr pyrtle   INGUINAL HERNIA REPAIR Bilateral right 10-25-1997:  left 09-28-2008 (dr m. Hassell Done @ [facility])   LAPAROSCOPIC CHOLECYSTECTOMY  2012   MANDIBLE SURGERY Bilateral 1990s   removal calcified bone growths   ROTATOR CUFF REPAIR Right 03-26-2009   dr Tonita Cong  [facility]   SHOULDER OPEN ROTATOR CUFF REPAIR  03/17/2012   Procedure: ROTATOR CUFF REPAIR SHOULDER OPEN;  Surgeon: Johnn Hai, MD;  Location: [facility];  Service: Orthopedics;  Laterality: Left;  LEFT MINI OPEN ROTATOR CUFF REPAIR    SPACE OAR INSTILLATION N/A 12/15/2017   Procedure: SPACE OAR INSTILLATION;  Surgeon: Franchot Gallo, MD;  Location[facility];  Service: Urology;  Laterality: N/A;   TONSILLECTOMY  child   UPPER GASTROINTESTINAL ENDOSCOPY     Family History  Problem Relation Age of Onset   Colon cancer Mother 29   Diabetes Maternal Grandmother    COPD Sister    Heart failure Father 85   Esophageal cancer Neg Hx    Rectal cancer Neg Hx    Stomach cancer Neg Hx    Social History   Socioeconomic History   Marital status: Married    Spouse name: Caren Griffins   Number of children: 0   Years of education: Not on file   Highest education level: Not on file  Occupational History   Occupation: Retired    Fish farm manager: RETIRED  Tobacco Use   Smoking status: Never   Smokeless tobacco: Never   Vaping Use   Vaping Use: Never used  Substance and Sexual Activity   Alcohol use: Yes    Comment: occasional   Drug use: No   Sexual activity: Yes    Comment: vasectomy 2011  Other Topics Concern   Not on file  Social History Narrative   Not on file   Social Determinants of Health   Financial Resource Strain: Low Risk    Difficulty of Paying Living Expenses: Not hard at all  Food Insecurity: No Food Insecurity   Worried About Charity fundraiser in the Last Year: Never true   [place] in the Last Year: Never true  Transportation Needs: No Transportation Needs   Lack of Transportation (Medical): No   Lack of Transportation (Non-Medical): No  Physical Activity: Sufficiently Active   Days of Exercise per Week: 7 days   Minutes of Exercise per Session:  30 min  Stress: No Stress Concern Present   Feeling of Stress : Not at all  Social Connections: Moderately Integrated   Frequency of Communication with Friends and Family: More than three times a week   Frequency of Social Gatherings with Friends and Family: More than three times a week   Attends Religious Services: More than 4 times per year   Active Member of [employer] or Organizations: No   Attends Music therapist: Never   Marital Status: Married    Tobacco Counseling Counseling given: Not Answered   Clinical Intake:  Pre-visit preparation completed: Yes  Pain : No/denies pain     Nutritional Status: BMI of 19-24  Normal Nutritional Risks: None Diabetes: No  How often do you need to have someone help you when you read instructions, pamphlets, or other written materials from your doctor or pharmacy?: 1 - Never  Diabetic?No  Interpreter Needed?: No  Information entered by :: Caroleen Hamman LPN   Activities of Daily Living In your present state of health, do you have any difficulty performing the following activities: 01/08/2021 09/02/2020  Hearing? N N  Vision? N N  Difficulty  concentrating or making decisions? N N  Walking or climbing stairs? N N  Dressing or bathing? N N  Doing errands, shopping? N N  Preparing Food and eating ? N -  Using the Toilet? N -  In the past six months, have you accidently leaked urine? N -  Do you have problems with loss of bowel control? N -  Managing your Medications? N -  Managing your Finances? N -  Housekeeping or managing your Housekeeping? N -  Some recent data might be hidden    Patient Care Team: [name], DO as PCP - General (Family Medicine) Franchot Gallo, MD as Consulting Physician (Urology) Karl Luke, MD as Consulting Physician (Optometry) Danella Sensing, MD as Consulting Physician (Dermatology) Burnell Blanks, MD as Consulting Physician (Cardiology) Pyrtle, Lajuan Lines, MD as Consulting Physician (Gastroenterology) Paulla Dolly Tamala Fothergill, DPM as Consulting Physician (Podiatry)  Indicate any recent Medical Services you may have received from other than [facility] providers in the past year (date may be approximate).     Assessment:   This is a routine wellness examination for Jarrion.  Hearing/Vision screen Hearing Screening - Comments:: No issues Vision Screening - Comments:: Last eye exam-12/2020-Dr. Eulas Post  Dietary issues and exercise activities discussed: Current Exercise Habits: Home exercise routine, Type of exercise: stretching;walking, Time (Minutes): 30, Frequency (Times/Week): 7, Weekly Exercise (Minutes/Week): 210, Intensity: Mild, Exercise limited by: None identified   Goals Addressed             This Visit's Progress    Patient Stated   On track    Maintain current health.      Patient Stated   On track    Increase activity        Depression Screen PHQ 2/9 Scores 01/08/2021 09/02/2020 05/09/2019 09/07/2017 10/26/2016  PHQ - 2 Score 0 0 0 0 0  PHQ- 9 Score - - - - 0    Fall Risk Fall Risk  01/08/2021 05/09/2019 04/11/2019 09/07/2017 10/26/2016  Falls in the past year? 1  0 1 No No  Comment - - Emmi Telephone Survey: data to providers prior to load - -  Number falls in past yr: 1 - 1 - -  Comment - - Emmi Telephone Survey Actual Response = 2 - -  Injury with Fall? 0 - 0 - -  Risk for fall due to : History of fall(s) - - - -  Follow up Falls prevention discussed Education provided;Falls prevention discussed - - -    FALL RISK PREVENTION PERTAINING TO THE HOME:  Any stairs in or around the home? No  Home free of loose throw rugs in walkways, pet beds, electrical cords, etc? Yes  Adequate lighting in your home to reduce risk of falls? Yes   ASSISTIVE DEVICES UTILIZED TO PREVENT FALLS:  Life alert? No  Use of a cane, walker or w/c? No  Grab bars in the bathroom? Yes  Shower chair or bench in shower? No  Elevated toilet seat or a handicapped toilet? No   TIMED UP AND GO:  Was the test performed? No . Phone visit   Cognitive Function:Normal cognitive status assessed by this Nurse Health Advisor. No abnormalities found.          Immunizations Immunization History  Administered Date(s) Administered   Influenza, High Dose Seasonal PF 03/01/2012, 04/20/2013, 02/16/2014, 03/08/2015, 02/25/2016, 01/27/2017, 03/07/2018, 02/21/2019, 02/13/2020   Influenza-Unspecified 03/01/2009, 03/11/2010, 03/10/2011   Moderna Sars-Covid-2 Vaccination 05/16/2019, 06/13/2019   Pneumococcal Conjugate-13 08/15/2013   Pneumococcal Polysaccharide-23 08/05/2011   Td 08/05/2003   Tdap 09/07/2011, 02/23/2019   Tetanus 10/27/2010   Zoster, Live 07/14/2010    TDAP status: Up to date  Flu Vaccine status: Up to date  Pneumococcal vaccine status: Up to date  Covid-19 vaccine status: Declined, Education has been provided regarding the importance of this vaccine but patient still declined. Advised may receive this vaccine at local pharmacy or Health Dept.or vaccine clinic. Aware to provide a copy of the vaccination record if obtained from local pharmacy or Health Dept.  Verbalized acceptance and understanding. Declined booster info  Qualifies for Shingles Vaccine? Yes   Zostavax completed Yes   Shingrix Completed?: No.    Education has been provided regarding the importance of this vaccine. Patient has been advised to call insurance company to determine out of pocket expense if they have not yet received this vaccine. Advised may also receive vaccine at local pharmacy or Health Dept. Verbalized acceptance and understanding.  Screening Tests Health Maintenance  Topic Date Due   Zoster Vaccines- Shingrix (1 of 2) Never done   COVID-19 Vaccine (3 - Moderna risk series) 07/11/2019   INFLUENZA VACCINE  12/16/2020   COLONOSCOPY (Pts 45-5yr Insurance coverage will need to be confirmed)  01/08/2025   TETANUS/TDAP  02/22/2029   Hepatitis C Screening  Completed   PNA vac Low Risk Adult  Completed   HPV VACCINES  Aged Out    Health Maintenance  Health Maintenance Due  Topic Date Due   Zoster Vaccines- Shingrix (1 of 2) Never done   COVID-19 Vaccine (3 - Moderna risk series) 07/11/2019   INFLUENZA VACCINE  12/16/2020    Colorectal cancer screening: Type of screening: Colonoscopy. Completed 01/09/2020. Repeat every 5 years  Lung Cancer Screening: (Low Dose CT Chest recommended if Age 75-80years, 30 pack-year currently smoking OR have quit w/in 15years.) does not qualify.     Additional Screening:  Hepatitis C Screening: Completed 06/27/2018  Vision Screening: Recommended annual ophthalmology exams for early detection of glaucoma and other disorders of the eye. Is the patient up to date with their annual eye exam?  Yes  Who is the provider or what is the name of the office in which the patient attends annual eye exams? Dr. GEulas Post  Dental Screening: Recommended annual dental exams for proper oral hygiene  Community Resource Referral / Chronic Care Management: CRR required this visit?  No   CCM required this visit?  No      Plan:     I have  personally reviewed and noted the following in the patient's chart:   Medical and social history Use of alcohol, tobacco or illicit drugs  Current medications and supplements including opioid prescriptions. Patient is not currently taking opioid prescriptions. Functional ability and status Nutritional status Physical activity Advanced directives List of other physicians Hospitalizations, surgeries, and ER visits in previous 12 months Vitals Screenings to include cognitive, depression, and falls Referrals and appointments  In addition, I have reviewed and discussed with patient certain preventive protocols, quality metrics, and best practice recommendations. A written personalized care plan for preventive services as well as general preventive health recommendations were provided to patient.   Due to this being a telephonic visit, the after visit summary with patients personalized plan was offered to patient via mail or my-chart. Patient would like to access on my-chart.   Marta Antu, LPN   [id number]  Nurse Health Advisor  Nurse Notes: None

## 2021-01-08 NOTE — Patient Instructions (Signed)
Manuel Landry , Thank you for taking time to complete your Medicare Wellness Visit. I appreciate your ongoing commitment to your health goals. Please review the following plan we discussed and let me know if I can assist you in the future.   Screening recommendations/referrals: Colonoscopy: Completed 01/09/2020-Due 01/08/2025 Recommended yearly ophthalmology/optometry visit for glaucoma screening and checkup Recommended yearly dental visit for hygiene and checkup  Vaccinations: Influenza vaccine: Due 01/2021 Pneumococcal vaccine: Up to date Tdap vaccine: Up to date Due-02/22/2029 Shingles vaccine: Discuss with pharmacy   Covid-19: Declined booster  Advanced directives: Copy in chart  Conditions/risks identified: See problem list  Next appointment: Follow up in one year for your annual wellness visit. 01/14/2022 @ 10:20  Preventive Care 65 Years and Older, Male Preventive care refers to lifestyle choices and visits with your health care provider that can promote health and wellness. What does preventive care include? A yearly physical exam. This is also called an annual well check. Dental exams once or twice a year. Routine eye exams. Ask your health care provider how often you should have your eyes checked. Personal lifestyle choices, including: Daily care of your teeth and gums. Regular physical activity. Eating a healthy diet. Avoiding tobacco and drug use. Limiting alcohol use. Practicing safe sex. Taking low doses of aspirin every day. Taking vitamin and mineral supplements as recommended by your health care provider. What happens during an annual well check? The services and screenings done by your health care provider during your annual well check will depend on your age, overall health, lifestyle risk factors, and family history of disease. Counseling  Your health care provider may ask you questions about your: Alcohol use. Tobacco use. Drug use. Emotional  well-being. Home and relationship well-being. Sexual activity. Eating habits. History of falls. Memory and ability to understand (cognition). Work and work Statistician. Screening  You may have the following tests or measurements: Height, weight, and BMI. Blood pressure. Lipid and cholesterol levels. These may be checked every 5 years, or more frequently if you are over 34 years old. Skin check. Lung cancer screening. You may have this screening every year starting at age 67 if you have a 30-pack-year history of smoking and currently smoke or have quit within the past 15 years. Fecal occult blood test (FOBT) of the stool. You may have this test every year starting at age 48. Flexible sigmoidoscopy or colonoscopy. You may have a sigmoidoscopy every 5 years or a colonoscopy every 10 years starting at age 74. Prostate cancer screening. Recommendations will vary depending on your family history and other risks. Hepatitis C blood test. Hepatitis B blood test. Sexually transmitted disease (STD) testing. Diabetes screening. This is done by checking your blood sugar (glucose) after you have not eaten for a while (fasting). You may have this done every 1-3 years. Abdominal aortic aneurysm (AAA) screening. You may need this if you are a current or former smoker. Osteoporosis. You may be screened starting at age 59 if you are at high risk. Talk with your health care provider about your test results, treatment options, and if necessary, the need for more tests. Vaccines  Your health care provider may recommend certain vaccines, such as: Influenza vaccine. This is recommended every year. Tetanus, diphtheria, and acellular pertussis (Tdap, Td) vaccine. You may need a Td booster every 10 years. Zoster vaccine. You may need this after age 4. Pneumococcal 13-valent conjugate (PCV13) vaccine. One dose is recommended after age 88. Pneumococcal polysaccharide (PPSV23) vaccine. One dose is  recommended after  age 9. Talk to your health care provider about which screenings and vaccines you need and how often you need them. This information is not intended to replace advice given to you by your health care provider. Make sure you discuss any questions you have with your health care provider. Document Released: 05/31/2015 Document Revised: 01/22/2016 Document Reviewed: 03/05/2015 Elsevier Interactive Patient Education  2017 [place] Prevention in the Home Falls can cause injuries. They can happen to people of all ages. There are many things you can do to make your home safe and to help prevent falls. What can I do on the outside of my home? Regularly fix the edges of walkways and driveways and fix any cracks. Remove anything that might make you trip as you walk through a door, such as a raised step or threshold. Trim any bushes or trees on the path to your home. Use bright outdoor lighting. Clear any walking paths of anything that might make someone trip, such as rocks or tools. Regularly check to see if handrails are loose or broken. Make sure that both sides of any steps have handrails. Any raised decks and porches should have guardrails on the edges. Have any leaves, snow, or ice cleared regularly. Use sand or salt on walking paths during winter. Clean up any spills in your garage right away. This includes oil or grease spills. What can I do in the bathroom? Use night lights. Install grab bars by the toilet and in the tub and shower. Do not use towel bars as grab bars. Use non-skid mats or decals in the tub or shower. If you need to sit down in the shower, use a plastic, non-slip stool. Keep the floor dry. Clean up any water that spills on the floor as soon as it happens. Remove soap buildup in the tub or shower regularly. Attach bath mats securely with double-sided non-slip rug tape. Do not have throw rugs and other things on the floor that can make you trip. What can I do in the  bedroom? Use night lights. Make sure that you have a light by your bed that is easy to reach. Do not use any sheets or blankets that are too big for your bed. They should not hang down onto the floor. Have a firm chair that has side arms. You can use this for support while you get dressed. Do not have throw rugs and other things on the floor that can make you trip. What can I do in the kitchen? Clean up any spills right away. Avoid walking on wet floors. Keep items that you use a lot in easy-to-reach places. If you need to reach something above you, use a strong step stool that has a grab bar. Keep electrical cords out of the way. Do not use floor polish or wax that makes floors slippery. If you must use wax, use non-skid floor wax. Do not have throw rugs and other things on the floor that can make you trip. What can I do with my stairs? Do not leave any items on the stairs. Make sure that there are handrails on both sides of the stairs and use them. Fix handrails that are broken or loose. Make sure that handrails are as long as the stairways. Check any carpeting to make sure that it is firmly attached to the stairs. Fix any carpet that is loose or worn. Avoid having throw rugs at the top or bottom of the stairs. If  you do have throw rugs, attach them to the floor with carpet tape. Make sure that you have a light switch at the top of the stairs and the bottom of the stairs. If you do not have them, ask someone to add them for you. What else can I do to help prevent falls? Wear shoes that: Do not have high heels. Have rubber bottoms. Are comfortable and fit you well. Are closed at the toe. Do not wear sandals. If you use a stepladder: Make sure that it is fully opened. Do not climb a closed stepladder. Make sure that both sides of the stepladder are locked into place. Ask someone to hold it for you, if possible. Clearly mark and make sure that you can see: Any grab bars or  handrails. First and last steps. Where the edge of each step is. Use tools that help you move around (mobility aids) if they are needed. These include: Canes. Walkers. Scooters. Crutches. Turn on the lights when you go into a dark area. Replace any light bulbs as soon as they burn out. Set up your furniture so you have a clear path. Avoid moving your furniture around. If any of your floors are uneven, fix them. If there are any pets around you, be aware of where they are. Review your medicines with your doctor. Some medicines can make you feel dizzy. This can increase your chance of falling. Ask your doctor what other things that you can do to help prevent falls. This information is not intended to replace advice given to you by your health care provider. Make sure you discuss any questions you have with your health care provider. Document Released: 02/28/2009 Document Revised: 10/10/2015 Document Reviewed: 06/08/2014 Elsevier Interactive Patient Education  2017 [employer].

## 2021-01-22 ENCOUNTER — Other Ambulatory Visit: Payer: Self-pay | Admitting: Family Medicine

## 2021-01-22 DIAGNOSIS — S46812D Strain of other muscles, fascia and tendons at shoulder and upper arm level, left arm, subsequent encounter: Secondary | ICD-10-CM

## 2021-02-20 ENCOUNTER — Ambulatory Visit (HOSPITAL_BASED_OUTPATIENT_CLINIC_OR_DEPARTMENT_OTHER): Payer: Medicare Other | Admitting: Cardiology

## 2021-03-04 ENCOUNTER — Ambulatory Visit: Payer: Medicare Other | Admitting: Family Medicine

## 2021-03-04 DIAGNOSIS — C61 Malignant neoplasm of prostate: Secondary | ICD-10-CM | POA: Diagnosis not present

## 2021-03-06 ENCOUNTER — Encounter: Payer: Self-pay | Admitting: Family Medicine

## 2021-03-06 ENCOUNTER — Other Ambulatory Visit: Payer: Self-pay

## 2021-03-06 ENCOUNTER — Ambulatory Visit (INDEPENDENT_AMBULATORY_CARE_PROVIDER_SITE_OTHER): Payer: Medicare Other | Admitting: Family Medicine

## 2021-03-06 VITALS — BP 118/70 | HR 87 | Temp 98.4°F | Resp 18 | Ht 71.0 in | Wt 181.2 lb

## 2021-03-06 DIAGNOSIS — J310 Chronic rhinitis: Secondary | ICD-10-CM | POA: Diagnosis not present

## 2021-03-06 DIAGNOSIS — F411 Generalized anxiety disorder: Secondary | ICD-10-CM

## 2021-03-06 DIAGNOSIS — R252 Cramp and spasm: Secondary | ICD-10-CM

## 2021-03-06 DIAGNOSIS — E782 Mixed hyperlipidemia: Secondary | ICD-10-CM

## 2021-03-06 LAB — COMPREHENSIVE METABOLIC PANEL
ALT: 10 U/L (ref 0–53)
AST: 18 U/L (ref 0–37)
Albumin: 4.5 g/dL (ref 3.5–5.2)
Alkaline Phosphatase: 47 U/L (ref 39–117)
BUN: 11 mg/dL (ref 6–23)
CO2: 28 mEq/L (ref 19–32)
Calcium: 9.3 mg/dL (ref 8.4–10.5)
Chloride: 96 mEq/L (ref 96–112)
Creatinine, Ser: 0.88 mg/dL (ref 0.40–1.50)
GFR: 84.1 mL/min (ref >60.00)
Glucose, Bld: 114 mg/dL — ABNORMAL HIGH (ref 70–99)
Potassium: 4.7 mEq/L (ref 3.5–5.1)
Sodium: 131 mEq/L — ABNORMAL LOW (ref 135–145)
Total Bilirubin: 0.6 mg/dL (ref 0.2–1.2)
Total Protein: 6.9 g/dL (ref 6.0–8.3)

## 2021-03-06 LAB — LIPID PANEL
Cholesterol: 213 mg/dL — ABNORMAL HIGH (ref 0–200)
HDL: 69.2 mg/dL (ref >39.00)
LDL Cholesterol: 125 mg/dL — ABNORMAL HIGH (ref 0–99)
NonHDL: 143.71
Total CHOL/HDL Ratio: 3
Triglycerides: 92 mg/dL (ref 0.0–149.0)
VLDL: 18.4 mg/dL (ref 0.0–40.0)

## 2021-03-06 NOTE — Patient Instructions (Signed)
Give [place] 2-3 business days to get the results of your labs back.   Keep the diet clean and stay active.  Start Zyrtec and Flonase at night for 1 month. If you are still having issues, please let me know.   Let us know if you need anything.

## 2021-03-06 NOTE — Progress Notes (Signed)
Chief Complaint  Patient presents with   Anxiety   Follow-up   Hypotension    Subjective Manuel Landry presents for f/u anxiety/depression.  Pt is currently being treated with Zoloft 50 mg/d.  Reports doing well since treatment. No thoughts of harming self or others. No self-medication with alcohol, prescription drugs or illicit drugs. Pt is not following with a counselor/psychologist.  Leg cramps- Will cramp at night. Tries to stay hydrated.   Hyperlipidemia Patient presents for hyperlipidemia follow up. Currently being treated with diet control. He is adhering to a healthy diet. Exercise: walking, some swimming The patient is not known to have coexisting coronary artery disease.  Rhinitis- runny nose at night. Honey helps. Failed INCS and Benadryl.   Past Medical History:  Diagnosis Date   Allergy    Anxiety    Arthritis    Blind loop syndrome    Bulging discs    LOWER BACK   Cataract    "beginnings of"   Diverticulosis    Family history of malignant neoplasm of gastrointestinal tract    History of adenomatous polyp of colon    History of Barrett's esophagus    History of chronic gastritis    Hyperlipidemia    no meds taken   IBS (irritable bowel syndrome)    Internal hemorrhoids    Malignant neoplasm of prostate [facility]) urologist-  dr dahlstedt/  oncologist-  dr Tammi Klippel   dx 08-09-2017-- Stage T2a, Gleason 4+5, PSA 3.22 (on finateride)--- treatment ADT and external beam radiation   PONV (postoperative nausea and vomiting)    Seasonal allergic rhinitis    Tubular adenoma of colon    Wears glasses    Allergies as of 03/06/2021   No Known Allergies      Medication List        Accurate as of March 06, 2021 10:04 AM. If you have any questions, ask your nurse or doctor.          cetirizine 10 MG chewable tablet Commonly known as: ZYRTEC Chew 10 mg by mouth daily. PRN   diclofenac Sodium 1 % Gel Commonly known as: VOLTAREN Apply 2 g topically  2 (two) times daily as needed.   fluticasone 50 MCG/ACT nasal spray Commonly known as: FLONASE SPRAY 2 SPRAYS INTO EACH NOSTRIL EVERY DAY   gabapentin 300 MG capsule Commonly known as: NEURONTIN Take 1 capsule (300 mg total) by mouth at bedtime.   ibuprofen 800 MG tablet Commonly known as: ADVIL TAKE 1 TABLET BY MOUTH EVERY 8 HOURS AS NEEDED   magnesium oxide 400 MG tablet Commonly known as: MAG-OX Take 400 mg by mouth daily.   NEURIVA PO Take by mouth daily.   Ocuvite Adult 50+ Caps Take 1 capsule by mouth daily.   PREVAGEN PO Take by mouth daily.   sertraline 50 MG tablet Commonly known as: ZOLOFT Take 1 tablet (50 mg total) by mouth daily.   tadalafil 5 MG tablet Commonly known as: CIALIS Take 5 mg by mouth every evening.   vitamin B-12 1000 MCG tablet Commonly known as: CYANOCOBALAMIN Take 1,000 mcg by mouth daily.        Exam BP 118/70 (BP Location: Left Arm, Patient Position: Sitting, Cuff Size: Normal)   Pulse 87   Temp 98.4 F (36.9 C) (Oral)   Resp 18   Ht 5\' 11"  (1.803 m)   Wt 181 lb 3.2 oz (82.2 kg)   SpO2 97%   BMI 25.27 kg/m  General:  well developed,  well nourished, in no apparent distress HEENT: No sinus ttp, nares are patent w/o rhinorrhea, MMM, no pharyngeal drainage/erythema, exudate Heart: RRR Lungs:  CTAB. No respiratory distress Psych: well oriented with normal range of affect and age-appropriate judgement/insight, alert and oriented x4.  Assessment and Plan  GAD (generalized anxiety disorder)  Nocturnal muscle cramp  Mixed hyperlipidemia - Plan: Lipid panel, Comprehensive metabolic panel  Chronic rhinitis  Chronic, stable. Cont Zoloft 50 mg/d. Chronic, stable. Cont gabapentin 300 mg qhs. Chronic, stable, diet controlled. Monitoring labs.  INCS + Zyrtec nightly for 1 mo. Will add Singulair if no improvement and refer to allergy.  F/u in 6 mo. The patient voiced understanding and agreement to the plan.  Robin [place], DO 03/06/21 10:04 AM

## 2021-03-12 DIAGNOSIS — N5201 Erectile dysfunction due to arterial insufficiency: Secondary | ICD-10-CM | POA: Diagnosis not present

## 2021-03-12 DIAGNOSIS — N401 Enlarged prostate with lower urinary tract symptoms: Secondary | ICD-10-CM | POA: Diagnosis not present

## 2021-03-12 DIAGNOSIS — Z8546 Personal history of malignant neoplasm of prostate: Secondary | ICD-10-CM | POA: Diagnosis not present

## 2021-03-12 DIAGNOSIS — R351 Nocturia: Secondary | ICD-10-CM | POA: Diagnosis not present

## 2021-03-23 NOTE — Progress Notes (Signed)
Chief Complaint  Patient presents with   New Patient (Initial Visit)    Syncope   History of Present Illness: 75 yo male with history of anxiety, Barrett's esophagus, chronic gastritis, hyperlipidemia and prostate cancer who is here today as a new consult, referred by Dr. Nani Ravens, for evaluation of syncope. I met him once in 2013. In July 2022 he was sleeping in the recliner and passed out after standing up. He was taken to the ED via EMS and he was felt to be dehydrated. He had been working in the yard in the heat that day and had also taken an extra muscle relaxer and Neurontin that day due to back pain. He was hydrated and discharged home. He has been feeling well since then. No chest pain, dyspnea or dizziness. He is very active. He exercises every day.   Primary Care Physician: [name], DO  Past Medical History:  Diagnosis Date   Allergy    Anxiety    Arthritis    Blind loop syndrome    Bulging discs    LOWER BACK   Cataract    "beginnings of"   Diverticulosis    Family history of malignant neoplasm of gastrointestinal tract    History of adenomatous polyp of colon    History of Barrett's esophagus    History of chronic gastritis    Hyperlipidemia    no meds taken   IBS (irritable bowel syndrome)    Internal hemorrhoids    Malignant neoplasm of prostate [facility]) urologist-  dr dahlstedt/  oncologist-  dr Tammi Klippel   dx 08-09-2017-- Stage T2a, Gleason 4+5, PSA 3.22 (on finateride)--- treatment ADT and external beam radiation   PONV (postoperative nausea and vomiting)    Seasonal allergic rhinitis    Tubular adenoma of colon    Wears glasses     Past Surgical History:  Procedure Laterality Date   COLONOSCOPY  last one 10-21-2016  dr Hilarie Fredrickson   CYSTOSCOPY WITH INSERTION OF UROLIFT N/A 12/15/2017   Procedure: CYSTOSCOPY WITH INSERTION OF UROLIFT, BIOPSY  PROSTATIC URETHAL;  Surgeon: Franchot Gallo, MD;  Location: [facility];  Service: Urology;   Laterality: N/A;   ESOPHAGOGASTRODUODENOSCOPY (EGD) WITH PROPOFOL  last one 09-26-2013   dr pyrtle   INGUINAL HERNIA REPAIR Bilateral right 10-25-1997:  left 09-28-2008 (dr m. Hassell Done @ [facility])   LAPAROSCOPIC CHOLECYSTECTOMY  2012   MANDIBLE SURGERY Bilateral 1990s   removal calcified bone growths   ROTATOR CUFF REPAIR Right 03-26-2009   dr Tonita Cong  [facility]   SHOULDER OPEN ROTATOR CUFF REPAIR  03/17/2012   Procedure: ROTATOR CUFF REPAIR SHOULDER OPEN;  Surgeon: Johnn Hai, MD;  Location: [facility];  Service: Orthopedics;  Laterality: Left;  LEFT MINI OPEN ROTATOR CUFF REPAIR    SPACE OAR INSTILLATION N/A 12/15/2017   Procedure: SPACE OAR INSTILLATION;  Surgeon: Franchot Gallo, MD;  Location: [facility];  Service: Urology;  Laterality: N/A;   TONSILLECTOMY  child   UPPER GASTROINTESTINAL ENDOSCOPY      Current Outpatient Medications  Medication Sig Dispense Refill   Apoaequorin (PREVAGEN PO) Take by mouth daily.     cetirizine (ZYRTEC) 10 MG chewable tablet Chew 10 mg by mouth daily. PRN     diclofenac Sodium (VOLTAREN) 1 % GEL Apply 2 g topically 2 (two) times daily as needed.     fluticasone (FLONASE) 50 MCG/ACT nasal spray SPRAY 2 SPRAYS INTO EACH NOSTRIL EVERY DAY 48 mL 2   gabapentin (NEURONTIN)  300 MG capsule Take 1 capsule (300 mg total) by mouth at bedtime. 90 capsule 2   ibuprofen (ADVIL) 800 MG tablet TAKE 1 TABLET BY MOUTH EVERY 8 HOURS AS NEEDED 30 tablet 0   magnesium oxide (MAG-OX) 400 MG tablet Take 400 mg by mouth daily.      Misc Natural Products (NEURIVA PO) Take by mouth daily.     Multiple Vitamins-Minerals (OCUVITE ADULT 50+) CAPS Take 1 capsule by mouth daily.     sertraline (ZOLOFT) 50 MG tablet Take 1 tablet (50 mg total) by mouth daily. 90 tablet 2   tadalafil (CIALIS) 5 MG tablet Take 5 mg by mouth every evening.     vitamin B-12 (CYANOCOBALAMIN) 1000 MCG tablet Take 1,000 mcg by mouth daily.     No current facility-administered medications for  this visit.    No Known Allergies  Social History   Socioeconomic History   Marital status: Married    Spouse name: Caren Griffins   Number of children: 0   Years of education: Not on file   Highest education level: Not on file  Occupational History   Occupation: Retired    Fish farm manager: RETIRED  Tobacco Use   Smoking status: Never   Smokeless tobacco: Never  Vaping Use   Vaping Use: Never used  Substance and Sexual Activity   Alcohol use: Yes    Comment: occasional   Drug use: No   Sexual activity: Yes    Comment: vasectomy 2011  Other Topics Concern   Not on file  Social History Narrative   Not on file   Social Determinants of Health   Financial Resource Strain: Low Risk    Difficulty of Paying Living Expenses: Not hard at all  Food Insecurity: No Food Insecurity   Worried About Charity fundraiser in the Last Year: Never true   [place] in the Last Year: Never true  Transportation Needs: No Transportation Needs   Lack of Transportation (Medical): No   Lack of Transportation (Non-Medical): No  Physical Activity: Sufficiently Active   Days of Exercise per Week: 7 days   Minutes of Exercise per Session: 30 min  Stress: No Stress Concern Present   Feeling of Stress : Not at all  Social Connections: Moderately Integrated   Frequency of Communication with Friends and Family: More than three times a week   Frequency of Social Gatherings with Friends and Family: More than three times a week   Attends Religious Services: More than 4 times per year   Active Member of [employer] or Organizations: No   Attends Music therapist: Never   Marital Status: Married  Human resources officer Violence: Not At Risk   Fear of Current or Ex-Partner: No   Emotionally Abused: No   Physically Abused: No   Sexually Abused: No    Family History  Problem Relation Age of Onset   Colon cancer Mother 39   Diabetes Maternal Grandmother    COPD Sister    Heart failure Father 77    Esophageal cancer Neg Hx    Rectal cancer Neg Hx    Stomach cancer Neg Hx     Review of Systems:  As stated in the HPI and otherwise negative.   BP 130/72   Pulse 77   Ht '5\' 11"'  (1.803 m)   Wt 182 lb 9.6 oz (82.8 kg)   SpO2 98%   BMI 25.47 kg/m   Physical Examination: General: Well developed, well nourished, NAD  HEENT: OP clear, mucus membranes moist  SKIN: warm, dry. No rashes. Neuro: No focal deficits  Musculoskeletal: Muscle strength 5/5 all ext  Psychiatric: Mood and affect normal  Neck: No JVD, no carotid bruits, no thyromegaly, no lymphadenopathy.  Lungs:Clear bilaterally, no wheezes, rhonci, crackles Cardiovascular: Regular rate and rhythm. No murmurs, gallops or rubs. Abdomen:Soft. Bowel sounds present. Non-tender.  Extremities: No lower extremity edema. Pulses are 2 + in the bilateral DP/PT.  EKG:  EKG is ordered today. The ekg ordered today demonstrates sinus  Recent Labs: 12/17/2020: Hemoglobin 14.2; Platelets 237.0 03/06/2021: ALT 10; BUN 11; Creatinine, Ser 0.88; Potassium 4.7; Sodium 131   Lipid Panel    Component Value Date/Time   CHOL 213 (H) 03/06/2021 1008   TRIG 92.0 03/06/2021 1008   HDL 69.20 03/06/2021 1008   CHOLHDL 3 03/06/2021 1008   VLDL 18.4 03/06/2021 1008   LDLCALC 125 (H) 03/06/2021 1008     Wt Readings from Last 3 Encounters:  03/24/21 182 lb 9.6 oz (82.8 kg)  03/06/21 181 lb 3.2 oz (82.2 kg)  01/08/21 177 lb (80.3 kg)     Assessment and Plan:   1. Syncope: His syncope was likely related to dehydration and medications. I will arrange an echo to exclude structural heart disease and assess LVEF  Current medicines are reviewed at length with the patient today.  The patient does not have concerns regarding medicines.  The following changes have been made:  no change  Labs/ tests ordered today include:   Orders Placed This Encounter  Procedures   EKG 12-Lead   ECHOCARDIOGRAM COMPLETE      Disposition:   F/U with me in one  year.    Signed, Lauree Chandler, MD 03/24/2021 10:28 AM    [facility] [address], [place], [place]  [address] Phone: [phone]; Fax: [phone]

## 2021-03-24 ENCOUNTER — Other Ambulatory Visit: Payer: Self-pay

## 2021-03-24 ENCOUNTER — Ambulatory Visit (INDEPENDENT_AMBULATORY_CARE_PROVIDER_SITE_OTHER): Payer: Medicare Other | Admitting: Cardiovascular Disease

## 2021-03-24 ENCOUNTER — Encounter: Payer: Self-pay | Admitting: Cardiovascular Disease

## 2021-03-24 VITALS — BP 130/72 | HR 77 | Ht 71.0 in | Wt 182.6 lb

## 2021-03-24 DIAGNOSIS — R55 Syncope and collapse: Secondary | ICD-10-CM

## 2021-03-24 NOTE — Patient Instructions (Signed)
Medication Instructions:  No changes *If you need a refill on your cardiac medications before your next appointment, please call your pharmacy*   Lab Work: none If you have labs (blood work) drawn today and your tests are completely normal, you will receive your results only by: MyChart Message (if you have MyChart) OR A paper copy in the mail If you have any lab test that is abnormal or we need to change your treatment, we will call you to review the results.   Testing/Procedures: Your physician has requested that you have an echocardiogram. Echocardiography is a painless test that uses sound waves to create images of your heart. It provides your doctor with information about the size and shape of your heart and how well your heart's chambers and valves are working. This procedure takes approximately one hour. There are no restrictions for this procedure.   Follow-Up: At [facility], you and your health needs are our priority.  As part of our continuing mission to provide you with exceptional heart care, we have created designated Provider Care Teams.  These Care Teams include your primary Cardiologist (physician) and Advanced Practice Providers (APPs -  Physician Assistants and Nurse Practitioners) who all work together to provide you with the care you need, when you need it.   Your next appointment:   12 month(s)  The format for your next appointment:   In Person  Provider:   Christopher McAlhany, MD    Other Instructions   

## 2021-04-16 ENCOUNTER — Ambulatory Visit (HOSPITAL_COMMUNITY): Payer: Medicare Other | Attending: Cardiology

## 2021-04-16 ENCOUNTER — Other Ambulatory Visit: Payer: Self-pay

## 2021-04-16 DIAGNOSIS — R55 Syncope and collapse: Secondary | ICD-10-CM | POA: Diagnosis not present

## 2021-04-16 LAB — ECHOCARDIOGRAM COMPLETE
Area-P 1/2: 3.83 cm2
S' Lateral: 2.8 cm

## 2021-04-17 ENCOUNTER — Encounter: Payer: Self-pay | Admitting: Cardiovascular Disease

## 2021-04-17 NOTE — Telephone Encounter (Signed)
The patient has been notified of the result and verbalized understanding.  All questions (if any) were answered. Rodman Key, RN 04/17/2021 11:11 AM

## 2021-05-15 IMAGING — DX DG CERVICAL SPINE 2 OR 3 VIEWS
3 series · 3 of 3 positions shown · non-contrast
Comparison: None.

CLINICAL DATA: Fall 1 month ago with scapular fracture.

EXAM:
CERVICAL SPINE - 2-3 VIEW

[c-spine lat]
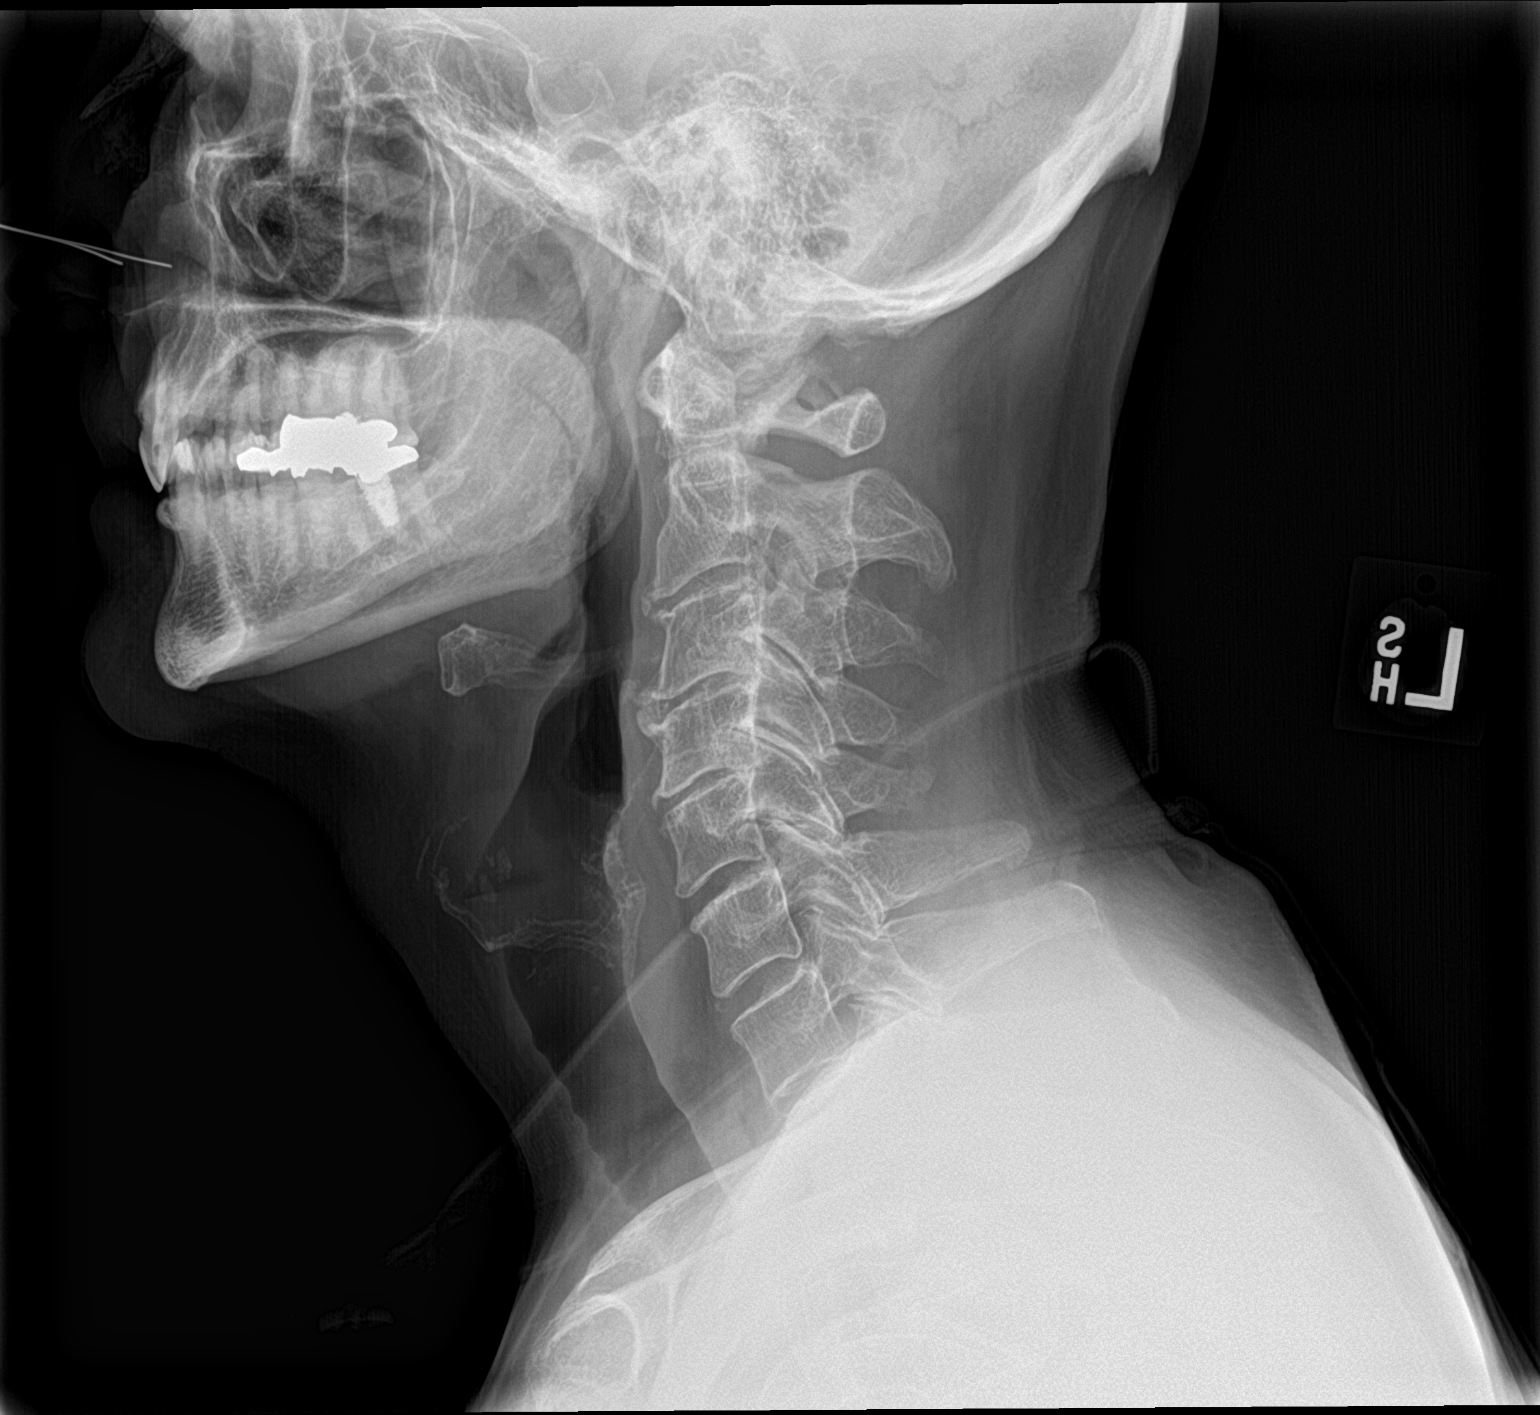

[c-spine ap]
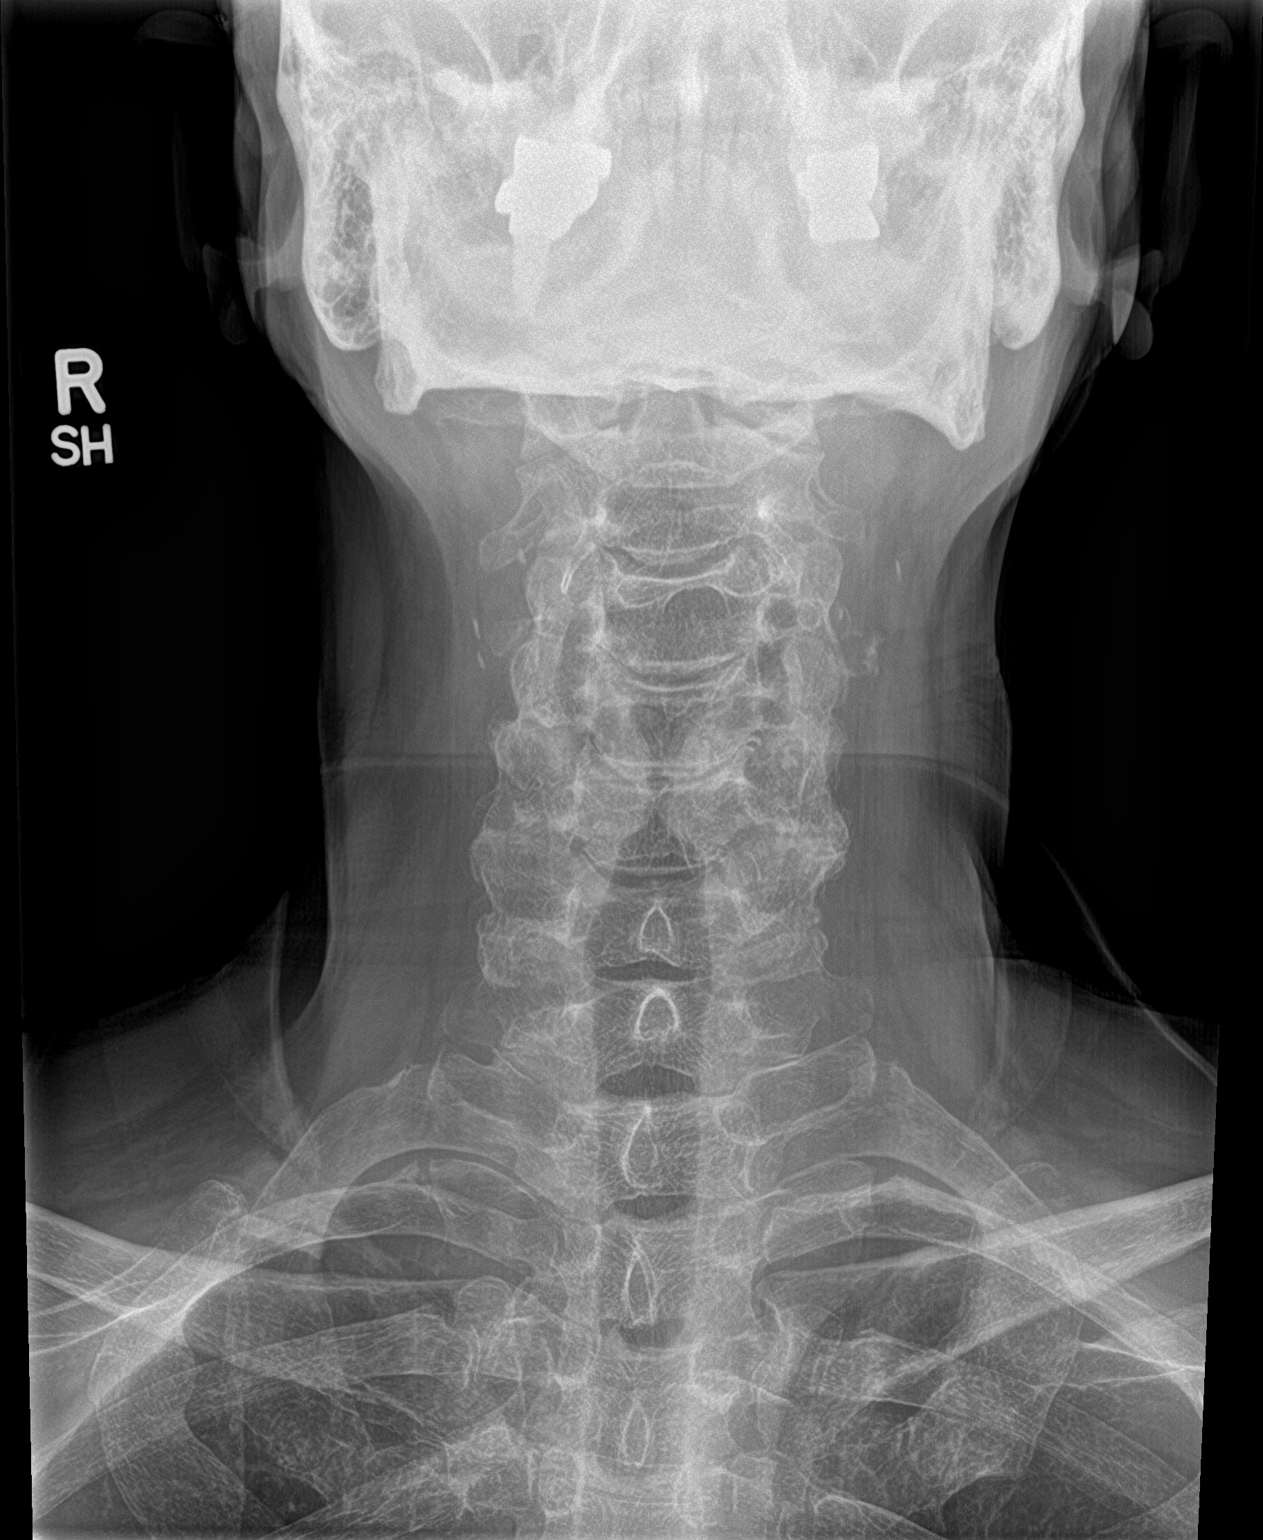

[c-spine open mouth]
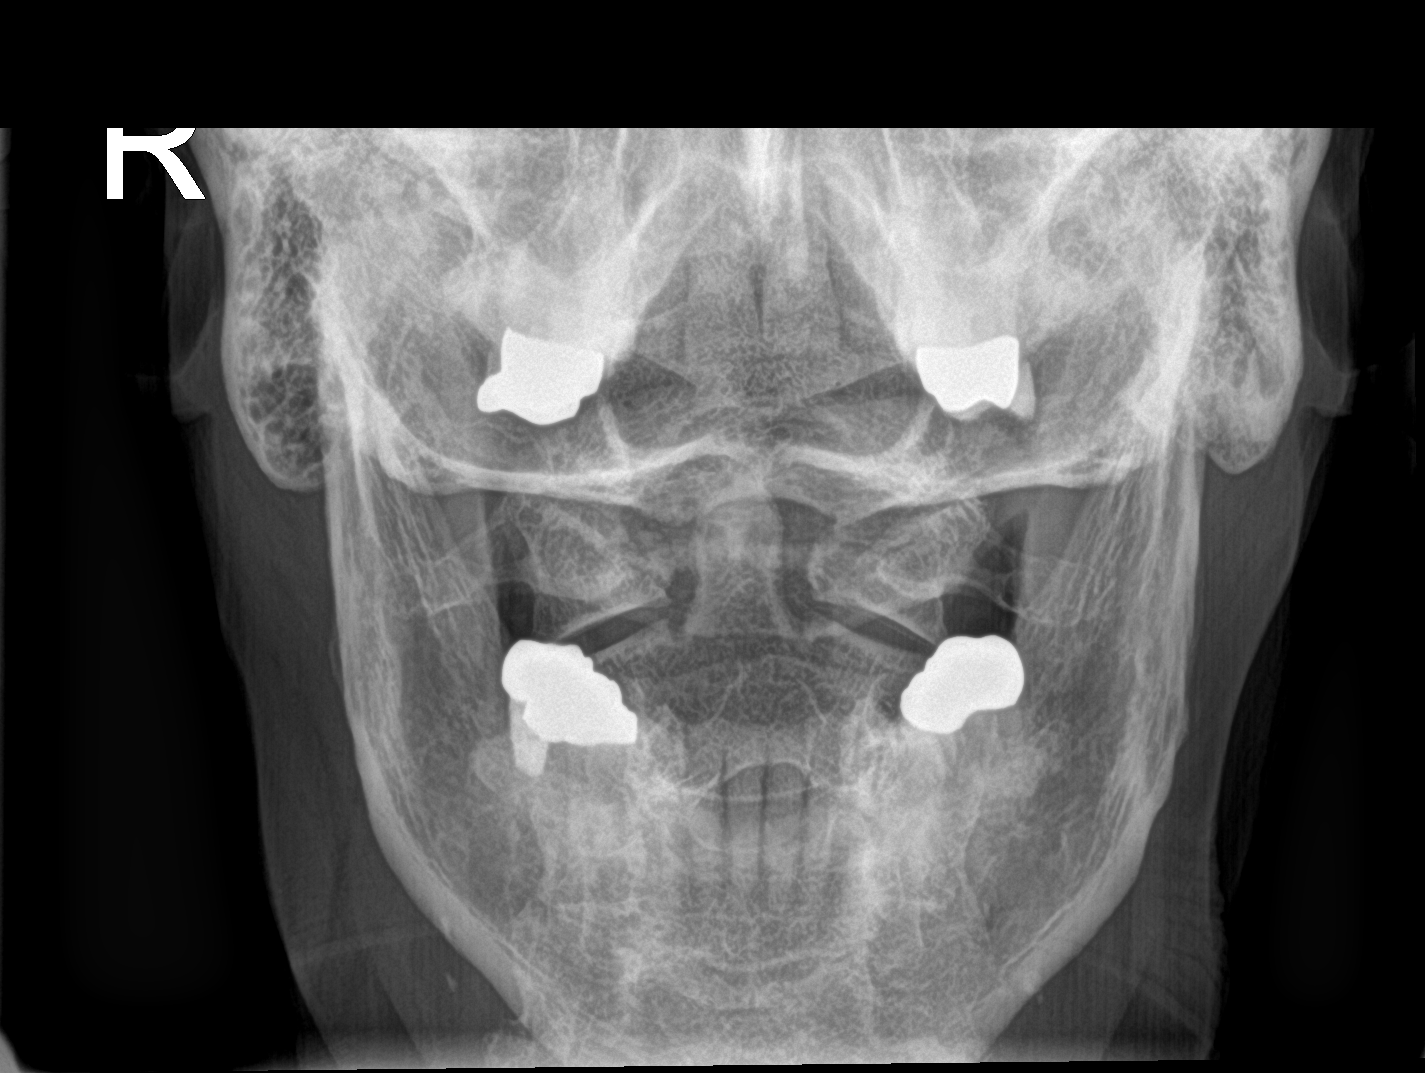

[3 of 3 positions shown; findings below may reference images not displayed]

FINDINGS: Vertebral body alignment and heights are normal. There is mild to
moderate spondylosis of the cervical spine. There is minimal disc
space narrowing at the C3-4 and C4-5 levels. Prevertebral soft
tissues are normal. There is uncovertebral joint spurring and facet
arthropathy. Atlantoaxial articulation is normal. There is no acute
fracture or subluxation.
IMPRESSION: 1. No acute findings.
2. Mild to moderate spondylosis of the cervical spine with disc
disease at the C3-4 and C4-5 levels.

## 2021-07-25 ENCOUNTER — Other Ambulatory Visit: Payer: Self-pay | Admitting: Family Medicine

## 2021-07-29 DIAGNOSIS — Z20822 Contact with and (suspected) exposure to covid-19: Secondary | ICD-10-CM | POA: Diagnosis not present

## 2021-08-11 DIAGNOSIS — Z20822 Contact with and (suspected) exposure to covid-19: Secondary | ICD-10-CM | POA: Diagnosis not present

## 2021-09-01 DIAGNOSIS — C61 Malignant neoplasm of prostate: Secondary | ICD-10-CM | POA: Diagnosis not present

## 2021-09-01 DIAGNOSIS — Z20822 Contact with and (suspected) exposure to covid-19: Secondary | ICD-10-CM | POA: Diagnosis not present

## 2021-09-02 ENCOUNTER — Other Ambulatory Visit (HOSPITAL_BASED_OUTPATIENT_CLINIC_OR_DEPARTMENT_OTHER): Payer: Self-pay

## 2021-09-02 ENCOUNTER — Ambulatory Visit (INDEPENDENT_AMBULATORY_CARE_PROVIDER_SITE_OTHER): Payer: Medicare Other | Admitting: Family Medicine

## 2021-09-02 ENCOUNTER — Encounter: Payer: Self-pay | Admitting: Family Medicine

## 2021-09-02 VITALS — BP 130/80 | HR 69 | Temp 98.1°F | Ht 71.0 in | Wt 179.4 lb

## 2021-09-02 DIAGNOSIS — H6983 Other specified disorders of Eustachian tube, bilateral: Secondary | ICD-10-CM | POA: Diagnosis not present

## 2021-09-02 DIAGNOSIS — F411 Generalized anxiety disorder: Secondary | ICD-10-CM | POA: Diagnosis not present

## 2021-09-02 MED ORDER — PREDNISONE 20 MG PO TABS
40.0000 mg | ORAL_TABLET | Freq: Every day | ORAL | 0 refills | Status: AC
  Filled 2021-09-02: qty 10, 5d supply, fill #0

## 2021-09-02 NOTE — Progress Notes (Signed)
Chief Complaint  ?Patient presents with  ? Follow-up  ?  Ear fullness ?  ? ? ?Subjective ?Manuel Landry presents for f/u anxiety. ? ?Pt is currently being treated with Zoloft 50 mg/d.  ?Reports doing well since treatment. ?No thoughts of harming self or others. ?No self-medication with alcohol, prescription drugs or illicit drugs. ?Pt is not following with a counselor/psychologist. ? ?B/l ear fullness, worse on L for the past week. Tried to equalize pressure with plugging nose and blowing out. Tried Sudafed and Flonase, no relief. Having a lot of nasal drainage. Not historically a waxy guy. Hearing muffled. Prednisone helps in past.  ? ?Past Medical History:  ?Diagnosis Date  ? Allergy   ? Anxiety   ? Arthritis   ? Blind loop syndrome   ? Bulging discs   ? LOWER BACK  ? Cataract   ? "beginnings of"  ? Diverticulosis   ? Family history of malignant neoplasm of gastrointestinal tract   ? History of adenomatous polyp of colon   ? History of Barrett's esophagus   ? History of chronic gastritis   ? Hyperlipidemia   ? no meds taken  ? IBS (irritable bowel syndrome)   ? Internal hemorrhoids   ? Malignant neoplasm of prostate [facility]) urologist-  dr dahlstedt/  oncologist-  dr Tammi Klippel  ? dx 08-09-2017-- Stage T2a, Gleason 4+5, PSA 3.22 (on finateride)--- treatment ADT and external beam radiation  ? PONV (postoperative nausea and vomiting)   ? Seasonal allergic rhinitis   ? Tubular adenoma of colon   ? Wears glasses   ? ?Allergies as of 09/02/2021   ?No Known Allergies ?  ? ?  ?Medication List  ?  ? ?  ? Accurate as of September 02, 2021 10:02 AM. If you have any questions, ask your nurse or doctor.  ?  ?  ? ?  ? ?STOP taking these medications   ? ?diclofenac Sodium 1 % Gel ?Commonly known as: VOLTAREN ?Stopped by: [name], DO ?  ?gabapentin 300 MG capsule ?Commonly known as: NEURONTIN ?Stopped by: [name], DO ?  ? ?  ? ?TAKE these medications   ? ?cetirizine 10 MG chewable tablet ?Commonly known  as: ZYRTEC ?Chew 10 mg by mouth daily. PRN ?  ?fluticasone 50 MCG/ACT nasal spray ?Commonly known as: FLONASE ?SPRAY 2 SPRAYS INTO EACH NOSTRIL EVERY DAY ?  ?ibuprofen 800 MG tablet ?Commonly known as: ADVIL ?TAKE 1 TABLET BY MOUTH EVERY 8 HOURS AS NEEDED ?  ?magnesium oxide 400 MG tablet ?Commonly known as: MAG-OX ?Take 400 mg by mouth daily. ?  ?NEURIVA PO ?Take by mouth daily. ?  ?Ocuvite Adult 50+ Caps ?Take 1 capsule by mouth daily. ?  ?predniSONE 20 MG tablet ?Commonly known as: DELTASONE ?Take 2 tablets (40 mg total) by mouth daily with breakfast for 5 days. ?Started by: [name], DO ?  ?PREVAGEN PO ?Take by mouth daily. ?  ?sertraline 50 MG tablet ?Commonly known as: ZOLOFT ?Take 1 tablet (50 mg total) by mouth daily. ?  ?tadalafil 5 MG tablet ?Commonly known as: CIALIS ?Take 5 mg by mouth every evening. ?  ?vitamin B-12 1000 MCG tablet ?Commonly known as: CYANOCOBALAMIN ?Take 1,000 mcg by mouth daily. ?  ? ?  ? ? ?Exam ?BP 130/80   Pulse 69   Temp 98.1 ?F (36.7 ?C) (Oral)   Ht '5\' 11"'$  (1.803 m)   Wt 179 lb 6 oz (81.4 kg)   SpO2 94%  BMI 25.02 kg/m?  ?General:  well developed, well nourished, in no apparent distress ?HEENT: Ears patent b/l, L TM slightly retracted, no fluid or erythema, R TM neg, nares patent, MMM ?Lungs:  No respiratory distress ?Psych: well oriented with normal range of affect and age-appropriate judgement/insight, alert and oriented x4. ? ?Assessment and Plan ? ?GAD (generalized anxiety disorder) ? ?Dysfunction of both eustachian tubes - Plan: predniSONE (DELTASONE) 20 MG tablet ? ?Chronic, stable. Cont Zoloft 50 mg/d.  ?5 d pred burst, 40 mg/d. Has worked well in past. Cont INCS and consider adding Allegra during this time of the year.  ?F/u in 6 mo. ?The patient voiced understanding and agreement to the plan. ? ?[name], DO ?09/02/21 ?10:02 AM ? ?

## 2021-09-02 NOTE — Patient Instructions (Signed)
Continue Flonase. Consider adding Allegra (fexofenadine) daily to help prevent the ear issues moving forward. ? ?Let us know if you need anything. ?

## 2021-09-08 DIAGNOSIS — Z8546 Personal history of malignant neoplasm of prostate: Secondary | ICD-10-CM | POA: Diagnosis not present

## 2021-09-08 DIAGNOSIS — N5201 Erectile dysfunction due to arterial insufficiency: Secondary | ICD-10-CM | POA: Diagnosis not present

## 2021-09-08 DIAGNOSIS — R351 Nocturia: Secondary | ICD-10-CM | POA: Diagnosis not present

## 2021-09-08 DIAGNOSIS — N401 Enlarged prostate with lower urinary tract symptoms: Secondary | ICD-10-CM | POA: Diagnosis not present

## 2021-09-18 DIAGNOSIS — Z20822 Contact with and (suspected) exposure to covid-19: Secondary | ICD-10-CM | POA: Diagnosis not present

## 2021-09-22 DIAGNOSIS — Z20822 Contact with and (suspected) exposure to covid-19: Secondary | ICD-10-CM | POA: Diagnosis not present

## 2021-09-29 ENCOUNTER — Other Ambulatory Visit (HOSPITAL_BASED_OUTPATIENT_CLINIC_OR_DEPARTMENT_OTHER): Payer: Self-pay

## 2021-09-29 ENCOUNTER — Encounter: Payer: Self-pay | Admitting: Family Medicine

## 2021-09-29 ENCOUNTER — Ambulatory Visit (INDEPENDENT_AMBULATORY_CARE_PROVIDER_SITE_OTHER): Payer: Medicare Other | Admitting: Family Medicine

## 2021-09-29 VITALS — BP 108/68 | HR 81 | Temp 98.5°F | Ht 71.0 in | Wt 179.2 lb

## 2021-09-29 DIAGNOSIS — H6983 Other specified disorders of Eustachian tube, bilateral: Secondary | ICD-10-CM

## 2021-09-29 MED ORDER — PREDNISONE 20 MG PO TABS
40.0000 mg | ORAL_TABLET | Freq: Every day | ORAL | 0 refills | Status: AC
  Filled 2021-09-29: qty 10, 5d supply, fill #0

## 2021-09-29 MED ORDER — AZELASTINE HCL 0.1 % NA SOLN
1.0000 | Freq: Two times a day (BID) | NASAL | 12 refills | Status: DC
  Filled 2021-09-29: qty 30, 100d supply, fill #0

## 2021-09-29 NOTE — Patient Instructions (Signed)
Add this new nasal spray along with the Flonase.  ? ?Let us know if you need anything. ?

## 2021-09-29 NOTE — Progress Notes (Signed)
Chief Complaint  ?Patient presents with  ? Ear Fullness  ?  Both ears ?  ? ? ?Pt is here for bilateral ear pain. Worse on L.  ?Duration: 1  week ?Progression: unchanged ?Associated symptoms: decreased hearing, fullness ?Denies: sore throat and congestion, bleeding, or discharge from ear ?Treatment to date: OTC decongestant and Flonase ? ?Past Medical History:  ?Diagnosis Date  ? Allergy   ? Anxiety   ? Arthritis   ? Blind loop syndrome   ? Bulging discs   ? LOWER BACK  ? Cataract   ? "beginnings of"  ? Diverticulosis   ? Family history of malignant neoplasm of gastrointestinal tract   ? History of adenomatous polyp of colon   ? History of Barrett's esophagus   ? History of chronic gastritis   ? Hyperlipidemia   ? no meds taken  ? IBS (irritable bowel syndrome)   ? Internal hemorrhoids   ? Malignant neoplasm of prostate [facility]) urologist-  dr dahlstedt/  oncologist-  dr Tammi Klippel  ? dx 08-09-2017-- Stage T2a, Gleason 4+5, PSA 3.22 (on finateride)--- treatment ADT and external beam radiation  ? PONV (postoperative nausea and vomiting)   ? Seasonal allergic rhinitis   ? Tubular adenoma of colon   ? Wears glasses   ? ? ?BP 108/68   Pulse 81   Temp 98.5 ?F (36.9 ?C) (Oral)   Ht '5\' 11"'$  (1.803 m)   Wt 179 lb 4 oz (81.3 kg)   SpO2 96%   BMI 25.00 kg/m?  ?General: Awake, alert, appearing stated age ?HEENT:  ?L ear- Canal patent without drainage or erythema, TM is neg ?R ear- canal patent without drainage or erythema, TM is neg ?Nose- nares patent and without discharge ?Mouth- Lips, gums and dentition unremarkable, pharynx is without erythema or exudate ?Neck: No adenopathy ?Lungs: Normal effort, no accessory muscle use ?Psych: Age appropriate judgment and insight, normal mood and affect ? ?Dysfunction of both eustachian tubes - Plan: predniSONE (DELTASONE) 20 MG tablet, azelastine (ASTELIN) 0.1 % nasal spray ? ?5 d pred burst 40 mg/d. Add Astelin to Flonase. Cont PO antihistamine.  ?F/u as originally scheduled or prn.  ?Pt  voiced understanding and agreement to the plan. ? ?[name], DO ?09/29/21 ?11:43 AM ? ?

## 2021-10-14 ENCOUNTER — Encounter: Payer: Self-pay | Admitting: Family Medicine

## 2021-10-19 ENCOUNTER — Encounter (HOSPITAL_BASED_OUTPATIENT_CLINIC_OR_DEPARTMENT_OTHER): Payer: Self-pay | Admitting: Emergency Medicine

## 2021-10-19 ENCOUNTER — Emergency Department (HOSPITAL_BASED_OUTPATIENT_CLINIC_OR_DEPARTMENT_OTHER)
Admission: EM | Admit: 2021-10-19 | Discharge: 2021-10-19 | Disposition: A | Discharge status: Home / Self Care | Payer: Medicare Other | Attending: Emergency Medicine | Admitting: Emergency Medicine

## 2021-10-19 ENCOUNTER — Other Ambulatory Visit: Payer: Self-pay

## 2021-10-19 DIAGNOSIS — Z7901 Long term (current) use of anticoagulants: Secondary | ICD-10-CM | POA: Diagnosis not present

## 2021-10-19 DIAGNOSIS — Z8546 Personal history of malignant neoplasm of prostate: Secondary | ICD-10-CM | POA: Insufficient documentation

## 2021-10-19 DIAGNOSIS — R0602 Shortness of breath: Secondary | ICD-10-CM | POA: Diagnosis not present

## 2021-10-19 DIAGNOSIS — I480 Paroxysmal atrial fibrillation: Secondary | ICD-10-CM | POA: Insufficient documentation

## 2021-10-19 DIAGNOSIS — R42 Dizziness and giddiness: Secondary | ICD-10-CM | POA: Diagnosis present

## 2021-10-19 LAB — BRAIN NATRIURETIC PEPTIDE: B Natriuretic Peptide: 75.4 pg/mL (ref 0.0–100.0)

## 2021-10-19 LAB — URINALYSIS, ROUTINE W REFLEX MICROSCOPIC
Bilirubin Urine: NEGATIVE
Glucose, UA: NEGATIVE mg/dL
Hgb urine dipstick: NEGATIVE
Ketones, ur: NEGATIVE mg/dL
Leukocytes,Ua: NEGATIVE
Nitrite: NEGATIVE
Protein, ur: NEGATIVE mg/dL
Specific Gravity, Urine: 1.015 (ref 1.005–1.030)
pH: 7 (ref 5.0–8.0)

## 2021-10-19 LAB — CBC WITH DIFFERENTIAL/PLATELET
Abs Immature Granulocytes: 0.02 10*3/uL (ref 0.00–0.07)
Basophils Absolute: 0.1 10*3/uL (ref 0.0–0.1)
Basophils Relative: 1 %
Eosinophils Absolute: 0.3 10*3/uL (ref 0.0–0.5)
Eosinophils Relative: 6 %
HCT: 43.7 % (ref 39.0–52.0)
Hemoglobin: 15.3 g/dL (ref 13.0–17.0)
Immature Granulocytes: 0 %
Lymphocytes Relative: 16 %
Lymphs Abs: 0.8 10*3/uL (ref 0.7–4.0)
MCH: 30.8 pg (ref 26.0–34.0)
MCHC: 35 g/dL (ref 30.0–36.0)
MCV: 87.9 fL (ref 80.0–100.0)
Monocytes Absolute: 0.6 10*3/uL (ref 0.1–1.0)
Monocytes Relative: 11 %
Neutro Abs: 3.4 10*3/uL (ref 1.7–7.7)
Neutrophils Relative %: 66 %
Platelets: 227 10*3/uL (ref 150–400)
RBC: 4.97 MIL/uL (ref 4.22–5.81)
RDW: 13.4 % (ref 11.5–15.5)
WBC: 5.2 10*3/uL (ref 4.0–10.5)
nRBC: 0 % (ref 0.0–0.2)

## 2021-10-19 LAB — LIPASE, BLOOD: Lipase: 35 U/L (ref 11–51)

## 2021-10-19 LAB — COMPREHENSIVE METABOLIC PANEL
ALT: 16 U/L (ref 0–44)
AST: 25 U/L (ref 15–41)
Albumin: 4 g/dL (ref 3.5–5.0)
Alkaline Phosphatase: 42 U/L (ref 38–126)
Anion gap: 9 (ref 5–15)
BUN: 15 mg/dL (ref 8–23)
CO2: 24 mmol/L (ref 22–32)
Calcium: 9.2 mg/dL (ref 8.9–10.3)
Chloride: 101 mmol/L (ref 98–111)
Creatinine, Ser: 1.14 mg/dL (ref 0.61–1.24)
GFR, Estimated: >60 mL/min (ref >60)
Glucose, Bld: 120 mg/dL — ABNORMAL HIGH (ref 70–99)
Potassium: 4.2 mmol/L (ref 3.5–5.1)
Sodium: 134 mmol/L — ABNORMAL LOW (ref 135–145)
Total Bilirubin: 0.9 mg/dL (ref 0.3–1.2)
Total Protein: 7.2 g/dL (ref 6.5–8.1)

## 2021-10-19 LAB — MAGNESIUM: Magnesium: 2.1 mg/dL (ref 1.7–2.4)

## 2021-10-19 MED ORDER — APIXABAN 5 MG PO TABS: 5.0000 mg | ORAL_TABLET | Freq: Two times a day (BID) | ORAL | 0 refills | Status: DC

## 2021-10-19 MED ORDER — DILTIAZEM HCL 30 MG PO TABS: 30.0000 mg | ORAL_TABLET | Freq: Every day | ORAL | 0 refills | Status: DC | PRN

## 2021-10-19 NOTE — ED Provider Notes (Signed)
[facility] EMERGENCY DEPARTMENT Provider Note   CSN: [id number] Arrival date & time: 10/19/21  1028     History  Chief Complaint  Patient presents with   Dizziness    Manuel Landry is a 76 y.o. male.  76 year old male comes in with chief complaint of dizziness.  Patient states that he started feeling dizzy Friday when he was doing some yard work.  On Saturday, he continued to have similar symptoms and today finally decided to come to the ER.  Symptoms are described as lightheadedness without spinning sensation, with 2 instances where he felt like he might faint.  Symptoms are unprovoked, and can also come while he is resting.   Patient also feels weak.  In the past he has been dehydrated when working outside, therefore he decided to hydrate well, but his symptoms have persisted.  He is not on any new medications.  He denies any chest pain, shortness of breath.      Home Medications Prior to Admission medications   Medication Sig Start Date End Date Taking? Authorizing Provider  apixaban (ELIQUIS) 5 MG TABS tablet Take 1 tablet (5 mg total) by mouth 2 (two) times daily. 10/19/21 11/18/21 Yes Varney Biles, MD  diltiazem (CARDIZEM) 30 MG tablet Take 1 tablet (30 mg total) by mouth daily as needed (HR > 110, and blood pressure is higher than 100/60). 10/19/21  Yes Nimo Verastegui, MD  Apoaequorin (PREVAGEN PO) Take by mouth daily.    [provider]  azelastine (ASTELIN) 0.1 % nasal spray Place 1 spray into both nostrils 2 (two) times daily. Use in each nostril as directed 09/29/21   [name], DO  cetirizine (ZYRTEC) 10 MG chewable tablet Chew 10 mg by mouth daily. PRN    [provider]  fluticasone (FLONASE) 50 MCG/ACT nasal spray SPRAY 2 SPRAYS INTO EACH NOSTRIL EVERY DAY 07/25/21   [name], DO  magnesium oxide (MAG-OX) 400 MG tablet Take 400 mg by mouth daily.     [provider]  Misc Natural Products (NEURIVA PO)  Take by mouth daily.    [provider]  Multiple Vitamins-Minerals (OCUVITE ADULT 50+) CAPS Take 1 capsule by mouth daily.    [provider]  sertraline (ZOLOFT) 50 MG tablet Take 1 tablet (50 mg total) by mouth daily. 09/02/20   [name], DO  tadalafil (CIALIS) 5 MG tablet Take 5 mg by mouth every evening.    [provider]  vitamin B-12 (CYANOCOBALAMIN) 1000 MCG tablet Take 1,000 mcg by mouth daily.    [provider]      Allergies    Patient has no known allergies.    Review of Systems   Review of Systems  All other systems reviewed and are negative.  Physical Exam Updated Vital Signs BP 126/82   Pulse 79   Temp 98.1 F (36.7 C) (Oral)   Resp 18   Ht '5\' 11"'$  (1.803 m)   Wt 81.3 kg   SpO2 98%   BMI 25.00 kg/m  Physical Exam Vitals and nursing note reviewed.  Constitutional:      Appearance: He is well-developed.  HENT:     Head: Atraumatic.  Eyes:     Extraocular Movements: Extraocular movements intact.     Pupils: Pupils are equal, round, and reactive to light.     Comments: No nystagmus  Cardiovascular:     Rate and Rhythm: Normal rate.  Pulmonary:     Effort:  Pulmonary effort is normal.  Musculoskeletal:     Cervical back: Neck supple.  Skin:    General: Skin is warm.  Neurological:     Mental Status: He is alert and oriented to person, place, and time.     Cranial Nerves: No cranial nerve deficit.     Sensory: No sensory deficit.     Motor: No weakness.     Coordination: Coordination normal.    ED Results / Procedures / Treatments   Labs (all labs ordered are listed, but only abnormal results are displayed) Labs Reviewed  COMPREHENSIVE METABOLIC PANEL - Abnormal; Notable for the following components:      Result Value   Sodium 134 (*)    Glucose, Bld 120 (*)    All other components within normal limits  CBC WITH DIFFERENTIAL/PLATELET  LIPASE, BLOOD  URINALYSIS, ROUTINE W REFLEX MICROSCOPIC   MAGNESIUM  BRAIN NATRIURETIC PEPTIDE    EKG EKG Interpretation  Date/Time:  Sunday October 19 2021 10:43:04 EDT Ventricular Rate:  92 PR Interval:    QRS Duration: 86 QT Interval:  355 QTC Calculation: 440 R Axis:   79 Text Interpretation: F Atrial fibrillation normal intervals afib is new No acute changes besides new AF Confirmed by Varney Biles ([id number]) on 10/19/2021 10:56:08 AM  Radiology No results found.  Procedures Procedures    Medications Ordered in ED Medications - No data to display  ED Course/ Medical Decision Making/ A&P       CHA2DS2-VASc Score: 2                    Medical Decision Making Amount and/or Complexity of Data Reviewed Labs: ordered.  Risk Prescription drug management.   This patient presents to the ED with chief complaint(s) of weakness, intermittent dizziness with pertinent past medical history of dehydration and remote history of prostate cancer, currently in remission which further complicates the presenting complaint. The complaint involves an extensive differential diagnosis and also carries with it a high risk of complications and morbidity.    Patient noted to be in atrial fibrillation at arrival.  My suspicion is that most likely patient is having symptoms of A-fib.  He does indicate that on occasions he has had some palpitations and felt like his heart was racing.  The differential diagnosis includes severe electrolyte abnormality, atrial fibrillation or other dysrhythmia -including bradycardia, TIA, dehydration, renal failure.  The differential diagnosis for A-fib could include structural change of the heart, electrolyte abnormality.  History is not indicative of underlying infection, thyroid disorder, PE.  The initial plan is to get basic labs in the ED.  Patient currently in sinus rhythm, therefore I suspect he is having paroxysmal A-fib.  Labs to ensure all electrolytes are fine.  X-ray and BNP also ordered to ensure there is no  objective evidence of volume overload.  Clinically patient does not appear volume overloaded.   Additional history obtained: Additional history obtained from spouse Records reviewed Primary Care Documents  Independent labs interpretation:  The following labs were independently interpreted: Normal CBC, normal metabolic profile, normal BNP.  Independent visualization of imaging: - I independently visualized the following imaging with scope of interpretation limited to determining acute life threatening conditions related to emergency care: X-ray of the chest, which revealed no evidence of pleural effusion, pulmonary edema  Treatment and Reassessment: While in the ED, patient has remained mostly in sinus rhythm.  He also has not had any A-fib with RVR.  We counseled him on  the diagnosis and mitigation of stroke with Eliquis.  Patient in agreement with the plan.  We did counsel him on Eliquis side effects.  Patient will be given Cardizem, that he can use only as needed.  Currently he is rate controlled and I do not think we need to put him on long-acting rate control from the ER.  Ambulatory A-fib clinic referral placed.  Patient's [place] VASc score is 2   Consideration for admission or further workup: Admission considered, however patient is reliable, he is rate controlled and we have ability to have him be seen by A-fib clinic rapidly -admission considered but not thought to be beneficial at this time.   Final Clinical Impression(s) / ED Diagnoses Final diagnoses:  Paroxysmal atrial fibrillation ([place])    Rx / DC Orders ED Discharge Orders          Ordered    Amb referral to AFIB Clinic        10/19/21 1138    apixaban (ELIQUIS) 5 MG TABS tablet  2 times daily        10/19/21 1248    diltiazem (CARDIZEM) 30 MG tablet  Daily PRN        10/19/21 1249              Varney Biles, MD 10/19/21 1306

## 2021-10-19 NOTE — Discharge Instructions (Addendum)
You are seen in the ER for dizziness and weakness.  Our work-up here indicates that you have atrial fibrillation.  Fortunately, your heart rate is within normal limits and while in the ER, you are back in normal rhythm.  Still, you are at risk for stroke therefore we will start you on blood thinners.  Prescribed as a medicine called diltiazem.  This is a medication that can slow down your heart rate if it is erratically fast.  If you notice severe shortness of breath, dizziness, weakness and your heart rate is over 110, consider taking this medication if your blood pressure is over 100/60.  If your blood pressure is low -then you will have to come to the ER.  If your heart rate does not improve despite the medicine, you will have to come back to the ER.    Information on my medicine - ELIQUIS (apixaban)  This medication education was reviewed with me or my healthcare representative as part of my discharge preparation.   Why was Eliquis prescribed for you? Eliquis was prescribed for you to reduce the risk of a blood clot forming that can cause a stroke if you have a medical condition called atrial fibrillation (a type of irregular heartbeat).  What do You need to know about Eliquis ? Take your Eliquis TWICE DAILY - one tablet in the morning and one tablet in the evening with or without food. If you have difficulty swallowing the tablet whole please discuss with your pharmacist how to take the medication safely.  Take Eliquis exactly as prescribed by your doctor and DO NOT stop taking Eliquis without talking to the doctor who prescribed the medication.  Stopping may increase your risk of developing a stroke.  Refill your prescription before you run out.  After discharge, you should have regular check-up appointments with your healthcare provider that is prescribing your Eliquis.  In the future your dose may need to be changed if your kidney function or weight changes by a significant amount  or as you get older.  What do you do if you miss a dose? If you miss a dose, take it as soon as you remember on the same day and resume taking twice daily.  Do not take more than one dose of ELIQUIS at the same time to make up a missed dose.  Important Safety Information A possible side effect of Eliquis is bleeding. You should call your healthcare provider right away if you experience any of the following: Bleeding from an injury or your nose that does not stop. Unusual colored urine (red or dark brown) or unusual colored stools (red or black). Unusual bruising for unknown reasons. A serious fall or if you hit your head (even if there is no bleeding).  Some medicines may interact with Eliquis and might increase your risk of bleeding or clotting while on Eliquis. To help avoid this, consult your healthcare provider or pharmacist prior to using any new prescription or non-prescription medications, including herbals, vitamins, non-steroidal anti-inflammatory drugs (NSAIDs) and supplements.  This website has more information on Eliquis (apixaban): http://www.eliquis.com/eliquis/home

## 2021-10-19 NOTE — ED Triage Notes (Signed)
Pt c/o feeling lightheaded for past couple of days. Pt started having diarrhea this morning.

## 2021-10-19 NOTE — ED Notes (Signed)
Patient states that he started filling dizzy Friday when he was outside doing yard work. States that it continued on til today. States that he has had 3 loose stools today. Denies any sob

## 2021-10-19 NOTE — ED Notes (Signed)
ED Provider at bedside. 

## 2021-10-22 ENCOUNTER — Ambulatory Visit (HOSPITAL_COMMUNITY)
Admission: RE | Admit: 2021-10-22 | Discharge: 2021-10-22 | Disposition: A | Discharge status: Home / Self Care | Payer: Medicare Other | Source: Ambulatory Visit | Attending: Physician Assistant | Admitting: Physician Assistant

## 2021-10-22 VITALS — BP 150/90 | HR 72 | Ht 71.0 in | Wt 178.0 lb

## 2021-10-22 DIAGNOSIS — E785 Hyperlipidemia, unspecified: Secondary | ICD-10-CM | POA: Insufficient documentation

## 2021-10-22 DIAGNOSIS — I491 Atrial premature depolarization: Secondary | ICD-10-CM

## 2021-10-22 DIAGNOSIS — D6869 Other thrombophilia: Secondary | ICD-10-CM | POA: Insufficient documentation

## 2021-10-22 DIAGNOSIS — R55 Syncope and collapse: Secondary | ICD-10-CM | POA: Diagnosis not present

## 2021-10-22 DIAGNOSIS — Z7901 Long term (current) use of anticoagulants: Secondary | ICD-10-CM | POA: Diagnosis not present

## 2021-10-22 DIAGNOSIS — Z8546 Personal history of malignant neoplasm of prostate: Secondary | ICD-10-CM | POA: Diagnosis not present

## 2021-10-22 DIAGNOSIS — I4891 Unspecified atrial fibrillation: Secondary | ICD-10-CM

## 2021-10-22 NOTE — Progress Notes (Signed)
Primary Care Physician: [name], DO Primary Cardiologist: Dr Angelena Form  Primary Electrophysiologist: none Referring Physician: [facility]   Manuel Landry is a 76 y.o. male with a history of HLD, prostate cancer, atrial fibrillation who presents for consultation in the [facility].  The patient was initially diagnosed with atrial fibrillation 10/19/21 after presenting to the ED with symptoms of dizziness and presyncope. ECG read as rate controlled afib. He was noted to be in SR the remainder of the time in the ED. Patient was started on Eliquis for a CHADS2VASC score of 2. He admits he had been working in the yard two days before his ED presentation and had felt dizzy all weekend. He has not had any further symptoms since 6/4.  Today, he denies symptoms of palpitations, chest pain, shortness of breath, orthopnea, PND, lower extremity edema, dizziness, presyncope, syncope, snoring, daytime somnolence, bleeding, or neurologic sequela. The patient is tolerating medications without difficulties and is otherwise without complaint today.    Atrial Fibrillation Risk Factors:  he does not have symptoms or diagnosis of sleep apnea. he does not have a history of rheumatic fever.   he has a BMI of Body mass index is 24.83 kg/m.Marland Kitchen Filed Weights   10/22/21 1048  Weight: 80.7 kg    Family History  Problem Relation Age of Onset   Colon cancer Mother 47   Diabetes Maternal Grandmother    COPD Sister    Heart failure Father 8   Esophageal cancer Neg Hx    Rectal cancer Neg Hx    Stomach cancer Neg Hx      Atrial Fibrillation Management history:  Previous antiarrhythmic drugs: none Previous cardioversions: none Previous ablations: none CHADS2VASC score: 2 Anticoagulation history: Eliquis   Past Medical History:  Diagnosis Date   Allergy    Anxiety    Arthritis    Blind loop syndrome    Bulging discs    LOWER BACK   Cataract     "beginnings of"   Diverticulosis    Family history of malignant neoplasm of gastrointestinal tract    History of adenomatous polyp of colon    History of Barrett's esophagus    History of chronic gastritis    Hyperlipidemia    no meds taken   IBS (irritable bowel syndrome)    Internal hemorrhoids    Malignant neoplasm of prostate [facility]) urologist-  dr dahlstedt/  oncologist-  dr Tammi Klippel   dx 08-09-2017-- Stage T2a, Gleason 4+5, PSA 3.22 (on finateride)--- treatment ADT and external beam radiation   PONV (postoperative nausea and vomiting)    Seasonal allergic rhinitis    Tubular adenoma of colon    Wears glasses    Past Surgical History:  Procedure Laterality Date   COLONOSCOPY  last one 10-21-2016  dr Hilarie Fredrickson   CYSTOSCOPY WITH INSERTION OF UROLIFT N/A 12/15/2017   Procedure: CYSTOSCOPY WITH INSERTION OF UROLIFT, BIOPSY  PROSTATIC URETHAL;  Surgeon: Franchot Gallo, MD;  Location: [facility];  Service: Urology;  Laterality: N/A;   ESOPHAGOGASTRODUODENOSCOPY (EGD) WITH PROPOFOL  last one 09-26-2013   dr pyrtle   INGUINAL HERNIA REPAIR Bilateral right 10-25-1997:  left 09-28-2008 (dr m. Hassell Done @ [facility])   LAPAROSCOPIC CHOLECYSTECTOMY  2012   MANDIBLE SURGERY Bilateral 1990s   removal calcified bone growths   ROTATOR CUFF REPAIR Right 03-26-2009   dr Tonita Cong  [facility]   SHOULDER OPEN ROTATOR CUFF REPAIR  03/17/2012   Procedure: ROTATOR CUFF  REPAIR SHOULDER OPEN;  Surgeon: Johnn Hai, MD;  Location: [facility];  Service: Orthopedics;  Laterality: Left;  LEFT MINI OPEN ROTATOR CUFF REPAIR    SPACE OAR INSTILLATION N/A 12/15/2017   Procedure: SPACE OAR INSTILLATION;  Surgeon: Franchot Gallo, MD;  Location: [facility];  Service: Urology;  Laterality: N/A;   TONSILLECTOMY  child   UPPER GASTROINTESTINAL ENDOSCOPY      Current Outpatient Medications  Medication Sig Dispense Refill   apixaban (ELIQUIS) 5 MG TABS tablet Take 1 tablet (5 mg total) by mouth 2  (two) times daily. 60 tablet 0   Apoaequorin (PREVAGEN PO) Take 1 tablet by mouth daily.     azelastine (ASTELIN) 0.1 % nasal spray Place 1 spray into both nostrils 2 (two) times daily. Use in each nostril as directed (Patient taking differently: Place 1 spray into both nostrils as needed. Use in each nostril as directed) 30 mL 12   cetirizine (ZYRTEC) 10 MG chewable tablet Chew 10 mg by mouth as needed. PRN     cetirizine-pseudoephedrine (ZYRTEC-D) 5-120 MG tablet Take 1 tablet by mouth as needed for allergies.     diltiazem (CARDIZEM) 30 MG tablet Take 1 tablet (30 mg total) by mouth daily as needed (HR > 110, and blood pressure is higher than 100/60). 5 tablet 0   Docusate Sodium (COLACE PO) Take 1 capsule by mouth as needed.     fluticasone (FLONASE) 50 MCG/ACT nasal spray SPRAY 2 SPRAYS INTO EACH NOSTRIL EVERY DAY 48 mL 2   ibuprofen (ADVIL) 600 MG tablet TAKING '600MG'$ -'800MG'$  3 TIMES WEEKLY WHEN HE HAS BACK PAIN     magnesium oxide (MAG-OX) 400 MG tablet Take 400 mg by mouth daily.      MELATONIN PO Take 1 tablet by mouth as needed.     Misc Natural Products (NEURIVA PO) Take 1 capsule by mouth daily.     Multiple Vitamins-Minerals (MULTIVITAMIN ADULTS PO) Take 1 tablet by mouth daily. Oxy Boost Nitric Oxide     Multiple Vitamins-Minerals (OCUVITE ADULT 50+) CAPS Take 1 capsule by mouth daily.     omeprazole (PRILOSEC) 20 MG capsule Take 20 mg by mouth daily.     Phenylephrine HCl (SUDAFED PE SINUS CONGESTION PO) Take 1 tablet by mouth as needed.     sertraline (ZOLOFT) 50 MG tablet Take 1 tablet (50 mg total) by mouth daily. 90 tablet 2   Simethicone 500 MG CAPS Take 1 capsule by mouth as needed.     tadalafil (CIALIS) 5 MG tablet Take 5 mg by mouth every evening.     vitamin B-12 (CYANOCOBALAMIN) 1000 MCG tablet Take 1,000 mcg by mouth daily.     No current facility-administered medications for this encounter.    No Known Allergies  Social History   Socioeconomic History   Marital  status: Married    Spouse name: Caren Griffins   Number of children: 0   Years of education: Not on file   Highest education level: Not on file  Occupational History   Occupation: Retired    Fish farm manager: RETIRED  Tobacco Use   Smoking status: Never   Smokeless tobacco: Never  Vaping Use   Vaping Use: Never used  Substance and Sexual Activity   Alcohol use: Yes    Comment: occasional   Drug use: No   Sexual activity: Yes    Comment: vasectomy 2011  Other Topics Concern   Not on file  Social History Narrative   Not on file  Social Determinants of Health   Financial Resource Strain: Low Risk    Difficulty of Paying Living Expenses: Not hard at all  Food Insecurity: No Food Insecurity   Worried About Charity fundraiser in the Last Year: Never true   [place] in the Last Year: Never true  Transportation Needs: No Transportation Needs   Lack of Transportation (Medical): No   Lack of Transportation (Non-Medical): No  Physical Activity: Sufficiently Active   Days of Exercise per Week: 7 days   Minutes of Exercise per Session: 30 min  Stress: No Stress Concern Present   Feeling of Stress : Not at all  Social Connections: Moderately Integrated   Frequency of Communication with Friends and Family: More than three times a week   Frequency of Social Gatherings with Friends and Family: More than three times a week   Attends Religious Services: More than 4 times per year   Active Member of [employer] or Organizations: No   Attends Archivist Meetings: Never   Marital Status: Married  Human resources officer Violence: Not At Risk   Fear of Current or Ex-Partner: No   Emotionally Abused: No   Physically Abused: No   Sexually Abused: No     ROS- All systems are reviewed and negative except as per the HPI above.  Physical Exam: Vitals:   10/22/21 1048  BP: (!) 150/90  Pulse: 72  Weight: 80.7 kg  Height: '5\' 11"'$  (1.803 m)    GEN- The patient is a well appearing elderly male,  alert and oriented x 3 today.   Head- normocephalic, atraumatic Eyes-  Sclera clear, conjunctiva pink Ears- hearing intact Oropharynx- clear Neck- supple  Lungs- Clear to ausculation bilaterally, normal work of breathing Heart- Regular rate and rhythm, no murmurs, rubs or gallops  GI- soft, NT, ND, + BS Extremities- no clubbing, cyanosis, or edema MS- no significant deformity or atrophy Skin- no rash or lesion Psych- euthymic mood, full affect Neuro- strength and sensation are intact  Wt Readings from Last 3 Encounters:  10/22/21 80.7 kg  10/19/21 81.3 kg  09/29/21 81.3 kg    EKG today demonstrates  SR Vent. rate 72 BPM PR interval 148 ms QRS duration 76 ms QT/QTcB 364/398 ms  Echo 04/16/21 demonstrated  1. Left ventricular ejection fraction, by estimation, is 55 to 60%. The  left ventricle has normal function. The left ventricle has no regional  wall motion abnormalities. There is mild left ventricular hypertrophy.  Left ventricular diastolic parameters are indeterminate.   2. Right ventricular systolic function is normal. The right ventricular  size is mildly enlarged. There is normal pulmonary artery systolic  pressure. The estimated right ventricular systolic pressure is 29.5 mmHg.   3. The mitral valve is normal in structure. Mild mitral valve  regurgitation. No evidence of mitral stenosis.   4. The aortic valve is tricuspid. Aortic valve regurgitation is trivial.  Aortic valve sclerosis/calcification is present, without any evidence of  aortic stenosis.   5. The inferior vena cava is normal in size with greater than 50%  respiratory variability, suggesting right atrial pressure of 3 mmHg.   Epic records are reviewed at length today  CHA2DS2-VASc Score = 2  The patient's score is based upon: CHF History: 0 HTN History: 0 Diabetes History: 0 Stroke History: 0 Vascular Disease History: 0 Age Score: 2 Gender Score: 0       ASSESSMENT AND PLAN: 1.  Dizziness/presyncope  The patient's CHA2DS2-VASc score is  2, indicating a 2.2% annual risk of stroke.   Reviewed ECG from ED with EP, felt to be more consistent with SR with frequent PACs than afib. Possible that he is having paroxysms of afib.  Will have patient wear a 14 day Zio monitor to evaluate for arrhythmia.  Continue Eliquis 5 mg BID for now. If no afib identified, will discontinue anticoagulation.   2. Secondary Hypercoagulable State (ICD10:  D68.69) The patient is at significant risk for stroke/thromboembolism based upon his CHA2DS2-VASc Score of 2.  Continue Apixaban (Eliquis).    Follow up in the AF clinic in 3-4 weeks.    [facility] [address] [place], [place] [address] [phone] 10/22/2021 1:07 PM

## 2021-10-25 ENCOUNTER — Other Ambulatory Visit: Payer: Self-pay | Admitting: Family Medicine

## 2021-10-25 DIAGNOSIS — F418 Other specified anxiety disorders: Secondary | ICD-10-CM

## 2021-11-04 DIAGNOSIS — H61002 Unspecified perichondritis of left external ear: Secondary | ICD-10-CM | POA: Diagnosis not present

## 2021-11-04 DIAGNOSIS — D1801 Hemangioma of skin and subcutaneous tissue: Secondary | ICD-10-CM | POA: Diagnosis not present

## 2021-11-04 DIAGNOSIS — L821 Other seborrheic keratosis: Secondary | ICD-10-CM | POA: Diagnosis not present

## 2021-11-04 DIAGNOSIS — D485 Neoplasm of uncertain behavior of skin: Secondary | ICD-10-CM | POA: Diagnosis not present

## 2021-11-04 DIAGNOSIS — Z85828 Personal history of other malignant neoplasm of skin: Secondary | ICD-10-CM | POA: Diagnosis not present

## 2021-11-04 DIAGNOSIS — L720 Epidermal cyst: Secondary | ICD-10-CM | POA: Diagnosis not present

## 2021-11-04 DIAGNOSIS — L570 Actinic keratosis: Secondary | ICD-10-CM | POA: Diagnosis not present

## 2021-11-10 DIAGNOSIS — I4891 Unspecified atrial fibrillation: Secondary | ICD-10-CM | POA: Diagnosis not present

## 2021-11-13 ENCOUNTER — Encounter (HOSPITAL_COMMUNITY): Payer: Self-pay | Admitting: Physician Assistant

## 2021-11-13 ENCOUNTER — Ambulatory Visit (HOSPITAL_COMMUNITY)
Admission: RE | Admit: 2021-11-13 | Discharge: 2021-11-13 | Disposition: A | Discharge status: Home / Self Care | Payer: Medicare Other | Source: Ambulatory Visit | Attending: Physician Assistant | Admitting: Physician Assistant

## 2021-11-13 VITALS — BP 138/72 | HR 71 | Ht 71.0 in | Wt 176.2 lb

## 2021-11-13 DIAGNOSIS — I4891 Unspecified atrial fibrillation: Secondary | ICD-10-CM | POA: Diagnosis not present

## 2021-11-13 DIAGNOSIS — Z923 Personal history of irradiation: Secondary | ICD-10-CM | POA: Diagnosis not present

## 2021-11-13 DIAGNOSIS — R42 Dizziness and giddiness: Secondary | ICD-10-CM | POA: Insufficient documentation

## 2021-11-13 DIAGNOSIS — E785 Hyperlipidemia, unspecified: Secondary | ICD-10-CM | POA: Insufficient documentation

## 2021-11-13 DIAGNOSIS — R55 Syncope and collapse: Secondary | ICD-10-CM | POA: Diagnosis not present

## 2021-11-13 DIAGNOSIS — C61 Malignant neoplasm of prostate: Secondary | ICD-10-CM | POA: Diagnosis not present

## 2021-11-13 NOTE — Progress Notes (Signed)
Primary Care Physician: [name], DO Primary Cardiologist: Dr Angelena Form  Primary Electrophysiologist: none Referring Physician: [facility]   Manuel Landry is a 76 y.o. male with a history of HLD, prostate cancer, atrial fibrillation who presents for follow up in the [facility].  The patient was initially diagnosed with atrial fibrillation 10/19/21 after presenting to the ED with symptoms of dizziness and presyncope. ECG read as rate controlled afib. He was noted to be in SR the remainder of the time in the ED. Patient was started on Eliquis for a CHADS2VASC score of 2. He admits he had been working in the yard two days before his ED presentation and had felt dizzy all weekend.   On follow up today, patient wore a 2 week cardiac monitor which showed 0% afib. He did have some brief SVT, no triggered events. He has worked out in the yard recently with no recurrence of his symptoms.   Today, he denies symptoms of palpitations, chest pain, shortness of breath, orthopnea, PND, lower extremity edema, dizziness, presyncope, syncope, snoring, daytime somnolence, bleeding, or neurologic sequela. The patient is tolerating medications without difficulties and is otherwise without complaint today.    Atrial Fibrillation Risk Factors:  he does not have symptoms or diagnosis of sleep apnea. he does not have a history of rheumatic fever.   he has a BMI of Body mass index is 24.57 kg/m.Marland Kitchen Filed Weights   11/13/21 1046  Weight: 79.9 kg     Family History  Problem Relation Age of Onset   Colon cancer Mother 51   Diabetes Maternal Grandmother    COPD Sister    Heart failure Father 13   Esophageal cancer Neg Hx    Rectal cancer Neg Hx    Stomach cancer Neg Hx      Atrial Fibrillation Management history:  Previous antiarrhythmic drugs: none Previous cardioversions: none Previous ablations: none CHADS2VASC score: 2 Anticoagulation history:  Eliquis   Past Medical History:  Diagnosis Date   Allergy    Anxiety    Arthritis    Blind loop syndrome    Bulging discs    LOWER BACK   Cataract    "beginnings of"   Diverticulosis    Family history of malignant neoplasm of gastrointestinal tract    History of adenomatous polyp of colon    History of Barrett's esophagus    History of chronic gastritis    Hyperlipidemia    no meds taken   IBS (irritable bowel syndrome)    Internal hemorrhoids    Malignant neoplasm of prostate [facility]) urologist-  dr dahlstedt/  oncologist-  dr Tammi Klippel   dx 08-09-2017-- Stage T2a, Gleason 4+5, PSA 3.22 (on finateride)--- treatment ADT and external beam radiation   PONV (postoperative nausea and vomiting)    Seasonal allergic rhinitis    Tubular adenoma of colon    Wears glasses    Past Surgical History:  Procedure Laterality Date   COLONOSCOPY  last one 10-21-2016  dr Hilarie Fredrickson   CYSTOSCOPY WITH INSERTION OF UROLIFT N/A 12/15/2017   Procedure: CYSTOSCOPY WITH INSERTION OF UROLIFT, BIOPSY  PROSTATIC URETHAL;  Surgeon: Franchot Gallo, MD;  Location: [place];  Service: Urology;  Laterality: N/A;   ESOPHAGOGASTRODUODENOSCOPY (EGD) WITH PROPOFOL  last one 09-26-2013   dr pyrtle   INGUINAL HERNIA REPAIR Bilateral right 10-25-1997:  left 09-28-2008 (dr m. Hassell Done @ [facility])   [place]   MANDIBLE SURGERY Bilateral  1990s   removal calcified bone growths   ROTATOR CUFF REPAIR Right 03-26-2009   dr Tonita Cong  [facility]   SHOULDER OPEN ROTATOR CUFF REPAIR  03/17/2012   Procedure: ROTATOR CUFF REPAIR SHOULDER OPEN;  Surgeon: Johnn Hai, MD;  Location: [facility];  Service: Orthopedics;  Laterality: Left;  LEFT MINI OPEN ROTATOR CUFF REPAIR    SPACE OAR INSTILLATION N/A 12/15/2017   Procedure: SPACE OAR INSTILLATION;  Surgeon: Franchot Gallo, MD;  Location[facility];  Service: Urology;  Laterality: N/A;   TONSILLECTOMY  child   UPPER GASTROINTESTINAL  ENDOSCOPY      Current Outpatient Medications  Medication Sig Dispense Refill   Apoaequorin (PREVAGEN PO) Take 1 tablet by mouth daily.     azelastine (ASTELIN) 0.1 % nasal spray Place 1 spray into both nostrils 2 (two) times daily. Use in each nostril as directed 30 mL 12   cetirizine (ZYRTEC) 10 MG chewable tablet Chew 10 mg by mouth as needed. PRN     cetirizine-pseudoephedrine (ZYRTEC-D) 5-120 MG tablet Take 1 tablet by mouth as needed for allergies.     diltiazem (CARDIZEM) 30 MG tablet Take 1 tablet (30 mg total) by mouth daily as needed (HR > 110, and blood pressure is higher than 100/60). 5 tablet 0   Docusate Sodium (COLACE PO) Take 1 capsule by mouth as needed.     fluticasone (FLONASE) 50 MCG/ACT nasal spray SPRAY 2 SPRAYS INTO EACH NOSTRIL EVERY DAY 48 mL 2   MELATONIN PO Take 1 tablet by mouth as needed.     Misc Natural Products (NEURIVA PO) Take 1 capsule by mouth daily.     Multiple Vitamins-Minerals (MULTIVITAMIN ADULTS PO) Take 1 tablet by mouth daily. Oxy Boost Nitric Oxide     Multiple Vitamins-Minerals (OCUVITE ADULT 50+) CAPS Take 1 capsule by mouth daily.     omeprazole (PRILOSEC) 20 MG capsule Take 20 mg by mouth daily.     Phenylephrine HCl (SUDAFED PE SINUS CONGESTION PO) Take 1 tablet by mouth as needed.     sertraline (ZOLOFT) 50 MG tablet TAKE 1 TABLET BY MOUTH EVERY DAY 90 tablet 2   Simethicone 500 MG CAPS Take 1 capsule by mouth as needed.     tadalafil (CIALIS) 5 MG tablet Take 5 mg by mouth every evening.     vitamin B-12 (CYANOCOBALAMIN) 1000 MCG tablet Take 1,000 mcg by mouth daily.     No current facility-administered medications for this encounter.    No Known Allergies  Social History   Socioeconomic History   Marital status: Married    Spouse name: Caren Griffins   Number of children: 0   Years of education: Not on file   Highest education level: Not on file  Occupational History   Occupation: Retired    Fish farm manager: RETIRED  Tobacco Use   Smoking  status: Never   Smokeless tobacco: Never   Tobacco comments:    Never smoke 11/13/21  Vaping Use   Vaping Use: Never used  Substance and Sexual Activity   Alcohol use: Yes    Comment: occasional   Drug use: No   Sexual activity: Yes    Comment: vasectomy 2011  Other Topics Concern   Not on file  Social History Narrative   Not on file   Social Determinants of Health   Financial Resource Strain: Low Risk  (01/08/2021)   Overall Financial Resource Strain (CARDIA)    Difficulty of Paying Living Expenses: Not hard at all  Food Insecurity: No Food Insecurity (01/08/2021)   Hunger Vital Sign    Worried About Running Out of Food in the Last Year: Never true    Ran Out of Food in the Last Year: Never true  Transportation Needs: No Transportation Needs (01/08/2021)   PRAPARE - Hydrologist (Medical): No    Lack of Transportation (Non-Medical): No  Physical Activity: Sufficiently Active (01/08/2021)   Exercise Vital Sign    Days of Exercise per Week: 7 days    Minutes of Exercise per Session: 30 min  Stress: No Stress Concern Present (01/08/2021)   [place]    Feeling of Stress : Not at all  Social Connections: Moderately Integrated (01/08/2021)   Social Connection and Isolation Panel [NHANES]    Frequency of Communication with Friends and Family: More than three times a week    Frequency of Social Gatherings with Friends and Family: More than three times a week    Attends Religious Services: More than 4 times per year    Active Member of [employer] or Organizations: No    Attends Archivist Meetings: Never    Marital Status: Married  Human resources officer Violence: Not At Risk (01/08/2021)   Humiliation, Afraid, Rape, and Kick questionnaire    Fear of Current or Ex-Partner: No    Emotionally Abused: No    Physically Abused: No    Sexually Abused: No     ROS- All systems are reviewed  and negative except as per the HPI above.  Physical Exam: Vitals:   11/13/21 1046  BP: 138/72  Pulse: 71  Weight: 79.9 kg  Height: '5\' 11"'$  (1.803 m)    GEN- The patient is a well appearing elderly male, alert and oriented x 3 today.   HEENT-head normocephalic, atraumatic, sclera clear, conjunctiva pink, hearing intact, trachea midline. Lungs- Clear to ausculation bilaterally, normal work of breathing Heart- Regular rate and rhythm, no murmurs, rubs or gallops  GI- soft, NT, ND, + BS Extremities- no clubbing, cyanosis, or edema MS- no significant deformity or atrophy Skin- no rash or lesion Psych- euthymic mood, full affect Neuro- strength and sensation are intact   Wt Readings from Last 3 Encounters:  11/13/21 79.9 kg  10/22/21 80.7 kg  10/19/21 81.3 kg    EKG today demonstrates  SR Vent. rate 71 BPM PR interval 142 ms QRS duration 80 ms QT/QTcB 376/408 ms  Echo 04/16/21 demonstrated  1. Left ventricular ejection fraction, by estimation, is 55 to 60%. The  left ventricle has normal function. The left ventricle has no regional  wall motion abnormalities. There is mild left ventricular hypertrophy.  Left ventricular diastolic parameters are indeterminate.   2. Right ventricular systolic function is normal. The right ventricular  size is mildly enlarged. There is normal pulmonary artery systolic  pressure. The estimated right ventricular systolic pressure is 16.1 mmHg.   3. The mitral valve is normal in structure. Mild mitral valve  regurgitation. No evidence of mitral stenosis.   4. The aortic valve is tricuspid. Aortic valve regurgitation is trivial.  Aortic valve sclerosis/calcification is present, without any evidence of  aortic stenosis.   5. The inferior vena cava is normal in size with greater than 50%  respiratory variability, suggesting right atrial pressure of 3 mmHg.   Epic records are reviewed at length today  CHA2DS2-VASc Score = 2  The patient's score  is based upon: CHF History: 0 HTN  History: 0 Diabetes History: 0 Stroke History: 0 Vascular Disease History: 0 Age Score: 2 Gender Score: 0       ASSESSMENT AND PLAN: 1. Dizziness/presyncope  The patient's CHA2DS2-VASc score is 2, indicating a 2.2% annual risk of stroke.   Reviewed ECG from ED with EP, felt to be more consistent with SR with frequent PACs than afib.  Zio monitor showed 0% afib burden, did have 29 SVT episodes, longest 18 seconds, no symptoms.  Will discontinue Eliquis at this time with no definitive afib.     Follow up with Dr Angelena Form. AF clinic as needed.    [facility] [address] [place], [place] [address] [phone] 11/13/2021 11:15 AM

## 2021-12-03 ENCOUNTER — Other Ambulatory Visit: Payer: Self-pay | Admitting: Family Medicine

## 2021-12-03 ENCOUNTER — Encounter: Payer: Self-pay | Admitting: Family Medicine

## 2021-12-03 DIAGNOSIS — H6983 Other specified disorders of Eustachian tube, bilateral: Secondary | ICD-10-CM

## 2021-12-11 DIAGNOSIS — H903 Sensorineural hearing loss, bilateral: Secondary | ICD-10-CM | POA: Diagnosis not present

## 2021-12-18 DIAGNOSIS — H903 Sensorineural hearing loss, bilateral: Secondary | ICD-10-CM | POA: Diagnosis not present

## 2021-12-18 NOTE — Progress Notes (Signed)
Office Visit    Patient Name: Manuel Landry Date of Encounter: 12/22/2021  PCP:  [name], [facility]  Cardiologist:  Lauree Chandler, MD  Advanced Practice Provider:  No care team member to display Electrophysiologist:  None   Chief Complaint    Manuel Landry is a 76 y.o. male with a medical history significant for HLD, prostate cancer, atrial fibrillation presents today for follow-up visit.  He was last seen in the atrial fibrillation clinic 11/13/2021 after initially being diagnosed with A-fib 10/19/2021.  He had presented to the ED with symptoms of dizziness and presyncope.  EKG read was rate controlled atrial fibrillation.  He was noted to be in normal sinus rhythm the remainder of the time in the ED.  The patient was started on Eliquis for CHA2DS2-VASc score of 2.  He admitted to working in the yard 2 days prior to his ED presentation and had felt dizzy all weekend.  When he was seen in the clinic, he had wore a ZIO monitor for 2 weeks which showed 0% A-fib, brief SVT, no triggered events.  He was working on it without no recurrence of the symptoms.  On deeper review, EKG from the ED was felt to be more consistent with normal sinus rhythm with frequent PACs than true atrial fibrillation.  Eliquis was discontinued.  Today, he is doing well today without any further fluttering in his chest.  We discussed his PACs and he is currently asymptomatic.  Heart rate is slightly elevated at 93 bpm.  We discussed potentially adding a beta-blocker but since he is asymptomatic did not add one today.  Electrolytes were normal when they were checked in the hospital.  A TSH was not checked which we will check today.  Otherwise, he is doing well from a cardiac standpoint.  He told me that he has taken gabapentin in the past for lower leg cramps and he had a syncopal event with his blood pressure dropping suddenly and dramatically.  We will add this  to his allergy list.  Reports no shortness of breath nor dyspnea on exertion. Reports no chest pain, pressure, or tightness. No edema, orthopnea, PND. Reports no palpitations.    Past Medical History    Past Medical History:  Diagnosis Date   Allergy    Anxiety    Arthritis    Blind loop syndrome    Bulging discs    LOWER BACK   Cataract    "beginnings of"   Diverticulosis    Family history of malignant neoplasm of gastrointestinal tract    History of adenomatous polyp of colon    History of Barrett's esophagus    History of chronic gastritis    Hyperlipidemia    no meds taken   IBS (irritable bowel syndrome)    Internal hemorrhoids    Malignant neoplasm of prostate [facility]) urologist-  dr dahlstedt/  oncologist-  dr Tammi Klippel   dx 08-09-2017-- Stage T2a, Gleason 4+5, PSA 3.22 (on finateride)--- treatment ADT and external beam radiation   PONV (postoperative nausea and vomiting)    Seasonal allergic rhinitis    Tubular adenoma of colon    Wears glasses    Past Surgical History:  Procedure Laterality Date   COLONOSCOPY  last one 10-21-2016  dr Hilarie Fredrickson   CYSTOSCOPY WITH INSERTION OF UROLIFT N/A 12/15/2017   Procedure: CYSTOSCOPY WITH INSERTION OF UROLIFT, BIOPSY  PROSTATIC URETHAL;  Surgeon: Franchot Gallo, MD;  Location: [place];  Service: Urology;  Laterality: N/A;   ESOPHAGOGASTRODUODENOSCOPY (EGD) WITH PROPOFOL  last one 09-26-2013   dr pyrtle   INGUINAL HERNIA REPAIR Bilateral right 10-25-1997:  left 09-28-2008 (dr m. Hassell Done @ [facility]   LAPAROSCOPIC CHOLECYSTECTOMY  2012   MANDIBLE SURGERY Bilateral 1990s   removal calcified bone growths   ROTATOR CUFF REPAIR Right 03-26-2009   dr Tonita Cong  [facility]   SHOULDER OPEN ROTATOR CUFF REPAIR  03/17/2012   Procedure: ROTATOR CUFF REPAIR SHOULDER OPEN;  Surgeon: Johnn Hai, MD;  Location: [facility];  Service: Orthopedics;  Laterality: Left;  LEFT MINI OPEN ROTATOR CUFF REPAIR    SPACE OAR INSTILLATION N/A 12/15/2017    Procedure: SPACE OAR INSTILLATION;  Surgeon: Franchot Gallo, MD;  Location: [facility];  Service: Urology;  Laterality: N/A;   TONSILLECTOMY  child   UPPER GASTROINTESTINAL ENDOSCOPY      Allergies  Allergies  Allergen Reactions   Gabapentin     Caused drop in blood pressure and loss of consciousness     EKGs/Labs/Other Studies Reviewed:   The following studies were reviewed today:  Zio Monitor 10/22/21   Patch Wear Time:  13 days and 22 hours   Predominant rhythm was sinus rhythm 29 SVT episodes, longest 18.2 seconds Less than 1% supraventricular ectopy 1.4% ventricular ectopy No triggered episodes   Will Camnitz, MD  EKG:  EKG is not ordered today.   Recent Labs: 10/19/2021: ALT 16; B Natriuretic Peptide 75.4; BUN 15; Creatinine, Ser 1.14; Hemoglobin 15.3; Magnesium 2.1; Platelets 227; Potassium 4.2; Sodium 134  Recent Lipid Panel    Component Value Date/Time   CHOL 213 (H) 03/06/2021 1008   TRIG 92.0 03/06/2021 1008   HDL 69.20 03/06/2021 1008   CHOLHDL 3 03/06/2021 1008   VLDL 18.4 03/06/2021 1008   LDLCALC 125 (H) 03/06/2021 1008    Home Medications   Current Meds  Medication Sig   Apoaequorin (PREVAGEN PO) Take 1 tablet by mouth daily.   azelastine (ASTELIN) 0.1 % nasal spray Place 1 spray into both nostrils 2 (two) times daily. Use in each nostril as directed   cetirizine (ZYRTEC) 10 MG chewable tablet Chew 10 mg by mouth as needed. PRN   cetirizine-pseudoephedrine (ZYRTEC-D) 5-120 MG tablet Take 1 tablet by mouth as needed for allergies.   Docusate Sodium (COLACE PO) Take 1 capsule by mouth as needed.   fluticasone (FLONASE) 50 MCG/ACT nasal spray SPRAY 2 SPRAYS INTO EACH NOSTRIL EVERY DAY   MELATONIN PO Take 1 tablet by mouth as needed.   Misc Natural Products (NEURIVA PO) Take 1 capsule by mouth daily.   Multiple Vitamins-Minerals (OCUVITE ADULT 50+) CAPS Take 1 capsule by mouth daily.   omeprazole (PRILOSEC) 20 MG capsule Take 20 mg  by mouth daily.   Phenylephrine HCl (SUDAFED PE SINUS CONGESTION PO) Take 1 tablet by mouth as needed.   sertraline (ZOLOFT) 50 MG tablet TAKE 1 TABLET BY MOUTH EVERY DAY   Simethicone 500 MG CAPS Take 1 capsule by mouth as needed.   tadalafil (CIALIS) 5 MG tablet Take 5 mg by mouth every evening.   vitamin B-12 (CYANOCOBALAMIN) 1000 MCG tablet Take 1,000 mcg by mouth daily.     Review of Systems      All other systems reviewed and are otherwise negative except as noted above.  Physical Exam    VS:  BP 130/82   Pulse 93   Ht '5\' 11"'$  (1.803 m)   Wt 176  lb 6.4 oz (80 kg)   SpO2 95%   BMI 24.60 kg/m  , BMI Body mass index is 24.6 kg/m.  Wt Readings from Last 3 Encounters:  12/22/21 176 lb 6.4 oz (80 kg)  11/13/21 176 lb 3.2 oz (79.9 kg)  10/22/21 178 lb (80.7 kg)     GEN: Well nourished, well developed, in no acute distress. HEENT: normal. Neck: Supple, no JVD, carotid bruits, or masses. Cardiac: RRR, no murmurs, rubs, or gallops. No clubbing, cyanosis, edema.  Radials/PT 2+ and equal bilaterally.  Respiratory:  Respirations regular and unlabored, clear to auscultation bilaterally. GI: Soft, nontender, nondistended. MS: No deformity or atrophy. Skin: Warm and dry, no rash. Neuro:  Strength and sensation are intact. Psych: Normal affect.  Assessment & Plan    NSR with frequent PACs -Asymptomatic -Not currently on a beta-blocker, consider adding in the future -Electrolytes all normal on recent labs -Will order a TSH  Hyperlipidemia -Monitored by his primary -LDL was above goal on last labs 10/22 -Repeat lipid panel with PCP in the fall and please send these results to [place] -Not currently on any medication    Disposition: Follow up 6 months with Lauree Chandler, MD or APP.  Signed, Elgie Collard, PA-C 12/22/2021, 11:49 AM [place]

## 2021-12-22 ENCOUNTER — Ambulatory Visit (INDEPENDENT_AMBULATORY_CARE_PROVIDER_SITE_OTHER): Payer: Medicare Other | Admitting: Physician Assistant

## 2021-12-22 ENCOUNTER — Encounter: Payer: Self-pay | Admitting: Physician Assistant

## 2021-12-22 VITALS — BP 130/82 | HR 93 | Ht 71.0 in | Wt 176.4 lb

## 2021-12-22 DIAGNOSIS — I4891 Unspecified atrial fibrillation: Secondary | ICD-10-CM | POA: Diagnosis not present

## 2021-12-22 DIAGNOSIS — R42 Dizziness and giddiness: Secondary | ICD-10-CM

## 2021-12-22 DIAGNOSIS — I491 Atrial premature depolarization: Secondary | ICD-10-CM

## 2021-12-22 DIAGNOSIS — E782 Mixed hyperlipidemia: Secondary | ICD-10-CM

## 2021-12-22 DIAGNOSIS — R55 Syncope and collapse: Secondary | ICD-10-CM | POA: Diagnosis not present

## 2021-12-22 LAB — TSH: TSH: 2.14 u[IU]/mL (ref 0.450–4.500)

## 2021-12-22 NOTE — Patient Instructions (Addendum)
Medication Instructions:  Your physician recommends that you continue on your current medications as directed. Please refer to the Current Medication list given to you today.  *If you need a refill on your cardiac medications before your next appointment, please call your pharmacy*   Lab Work: TSH today Have your primary care fax [place] a copy of your cholesterol panel when you have it drawn in the fall If you have labs (blood work) drawn today and your tests are completely normal, you will receive your results only by: [place] (if you have MyChart) OR A paper copy in the mail If you have any lab test that is abnormal or we need to change your treatment, we will call you to review the results.  Follow-Up: At [facility], you and your health needs are our priority.  As part of our continuing mission to provide you with exceptional heart care, we have created designated Provider Care Teams.  These Care Teams include your primary Cardiologist (physician) and Advanced Practice Providers (APPs -  Physician Assistants and Nurse Practitioners) who all work together to provide you with the care you need, when you need it.  Your next appointment:   6 month(s)  The format for your next appointment:   In Person  Provider:   Lauree Chandler, MD {  Important Information About Sugar

## 2021-12-26 DIAGNOSIS — H18593 Other hereditary corneal dystrophies, bilateral: Secondary | ICD-10-CM | POA: Diagnosis not present

## 2021-12-26 DIAGNOSIS — H43813 Vitreous degeneration, bilateral: Secondary | ICD-10-CM | POA: Diagnosis not present

## 2021-12-26 DIAGNOSIS — H25813 Combined forms of age-related cataract, bilateral: Secondary | ICD-10-CM | POA: Diagnosis not present

## 2021-12-26 DIAGNOSIS — Z83511 Family history of glaucoma: Secondary | ICD-10-CM | POA: Diagnosis not present

## 2022-01-14 ENCOUNTER — Ambulatory Visit (INDEPENDENT_AMBULATORY_CARE_PROVIDER_SITE_OTHER): Payer: Medicare Other | Admitting: *Deleted

## 2022-01-14 DIAGNOSIS — Z0000 Encounter for general adult medical examination without abnormal findings: Secondary | ICD-10-CM

## 2022-01-14 NOTE — Patient Instructions (Signed)
Manuel Landry , Thank you for taking time to come for your Medicare Wellness Visit. I appreciate your ongoing commitment to your health goals. Please review the following plan we discussed and let me know if I can assist you in the future.   These are the goals we discussed:  Goals      Patient Stated     Maintain current health.      Patient Stated     Increase activity         This is a list of the screening recommended for you and due dates:  Health Maintenance  Topic Date Due   COVID-19 Vaccine (3 - Moderna series) 08/08/2019   Zoster (Shingles) Vaccine (2 of 2) 03/20/2021   Flu Shot  12/16/2021   Colon Cancer Screening  01/08/2025   Tetanus Vaccine  02/22/2029   Pneumonia Vaccine  Completed   Hepatitis C Screening: USPSTF Recommendation to screen - Ages 18-79 yo.  Completed   HPV Vaccine  Aged Out       Next appointment: Follow up in one year for your annual wellness visit.   Preventive Care 76 Years and Older, Male Preventive care refers to lifestyle choices and visits with your health care provider that can promote health and wellness. What does preventive care include? A yearly physical exam. This is also called an annual well check. Dental exams once or twice a year. Routine eye exams. Ask your health care provider how often you should have your eyes checked. Personal lifestyle choices, including: Daily care of your teeth and gums. Regular physical activity. Eating a healthy diet. Avoiding tobacco and drug use. Limiting alcohol use. Practicing safe sex. Taking low doses of aspirin every day. Taking vitamin and mineral supplements as recommended by your health care provider. What happens during an annual well check? The services and screenings done by your health care provider during your annual well check will depend on your age, overall health, lifestyle risk factors, and family history of disease. Counseling  Your health care provider may ask you  questions about your: Alcohol use. Tobacco use. Drug use. Emotional well-being. Home and relationship well-being. Sexual activity. Eating habits. History of falls. Memory and ability to understand (cognition). Work and work Statistician. Screening  You may have the following tests or measurements: Height, weight, and BMI. Blood pressure. Lipid and cholesterol levels. These may be checked every 5 years, or more frequently if you are over 15 years old. Skin check. Lung cancer screening. You may have this screening every year starting at age 37 if you have a 30-pack-year history of smoking and currently smoke or have quit within the past 15 years. Fecal occult blood test (FOBT) of the stool. You may have this test every year starting at age 29. Flexible sigmoidoscopy or colonoscopy. You may have a sigmoidoscopy every 5 years or a colonoscopy every 10 years starting at age 73. Prostate cancer screening. Recommendations will vary depending on your family history and other risks. Hepatitis C blood test. Hepatitis B blood test. Sexually transmitted disease (STD) testing. Diabetes screening. This is done by checking your blood sugar (glucose) after you have not eaten for a while (fasting). You may have this done every 1-3 years. Abdominal aortic aneurysm (AAA) screening. You may need this if you are a current or former smoker. Osteoporosis. You may be screened starting at age 31 if you are at high risk. Talk with your health care provider about your test results, treatment options, and if  necessary, the need for more tests. Vaccines  Your health care provider may recommend certain vaccines, such as: Influenza vaccine. This is recommended every year. Tetanus, diphtheria, and acellular pertussis (Tdap, Td) vaccine. You may need a Td booster every 10 years. Zoster vaccine. You may need this after age 85. Pneumococcal 13-valent conjugate (PCV13) vaccine. One dose is recommended after age  25. Pneumococcal polysaccharide (PPSV23) vaccine. One dose is recommended after age 28. Talk to your health care provider about which screenings and vaccines you need and how often you need them. This information is not intended to replace advice given to you by your health care provider. Make sure you discuss any questions you have with your health care provider. Document Released: 05/31/2015 Document Revised: 01/22/2016 Document Reviewed: 03/05/2015 Elsevier Interactive Patient Education  2017 [employer] Prevention in the Home Falls can cause injuries. They can happen to people of all ages. There are many things you can do to make your home safe and to help prevent falls. What can I do on the outside of my home? Regularly fix the edges of walkways and driveways and fix any cracks. Remove anything that might make you trip as you walk through a door, such as a raised step or threshold. Trim any bushes or trees on the path to your home. Use bright outdoor lighting. Clear any walking paths of anything that might make someone trip, such as rocks or tools. Regularly check to see if handrails are loose or broken. Make sure that both sides of any steps have handrails. Any raised decks and porches should have guardrails on the edges. Have any leaves, snow, or ice cleared regularly. Use sand or salt on walking paths during winter. Clean up any spills in your garage right away. This includes oil or grease spills. What can I do in the bathroom? Use night lights. Install grab bars by the toilet and in the tub and shower. Do not use towel bars as grab bars. Use non-skid mats or decals in the tub or shower. If you need to sit down in the shower, use a plastic, non-slip stool. Keep the floor dry. Clean up any water that spills on the floor as soon as it happens. Remove soap buildup in the tub or shower regularly. Attach bath mats securely with double-sided non-slip rug tape. Do not have throw  rugs and other things on the floor that can make you trip. What can I do in the bedroom? Use night lights. Make sure that you have a light by your bed that is easy to reach. Do not use any sheets or blankets that are too big for your bed. They should not hang down onto the floor. Have a firm chair that has side arms. You can use this for support while you get dressed. Do not have throw rugs and other things on the floor that can make you trip. What can I do in the kitchen? Clean up any spills right away. Avoid walking on wet floors. Keep items that you use a lot in easy-to-reach places. If you need to reach something above you, use a strong step stool that has a grab bar. Keep electrical cords out of the way. Do not use floor polish or wax that makes floors slippery. If you must use wax, use non-skid floor wax. Do not have throw rugs and other things on the floor that can make you trip. What can I do with my stairs? Do not leave any items  on the stairs. Make sure that there are handrails on both sides of the stairs and use them. Fix handrails that are broken or loose. Make sure that handrails are as long as the stairways. Check any carpeting to make sure that it is firmly attached to the stairs. Fix any carpet that is loose or worn. Avoid having throw rugs at the top or bottom of the stairs. If you do have throw rugs, attach them to the floor with carpet tape. Make sure that you have a light switch at the top of the stairs and the bottom of the stairs. If you do not have them, ask someone to add them for you. What else can I do to help prevent falls? Wear shoes that: Do not have high heels. Have rubber bottoms. Are comfortable and fit you well. Are closed at the toe. Do not wear sandals. If you use a stepladder: Make sure that it is fully opened. Do not climb a closed stepladder. Make sure that both sides of the stepladder are locked into place. Ask someone to hold it for you, if  possible. Clearly mark and make sure that you can see: Any grab bars or handrails. First and last steps. Where the edge of each step is. Use tools that help you move around (mobility aids) if they are needed. These include: Canes. Walkers. Scooters. Crutches. Turn on the lights when you go into a dark area. Replace any light bulbs as soon as they burn out. Set up your furniture so you have a clear path. Avoid moving your furniture around. If any of your floors are uneven, fix them. If there are any pets around you, be aware of where they are. Review your medicines with your doctor. Some medicines can make you feel dizzy. This can increase your chance of falling. Ask your doctor what other things that you can do to help prevent falls. This information is not intended to replace advice given to you by your health care provider. Make sure you discuss any questions you have with your health care provider. Document Released: 02/28/2009 Document Revised: 10/10/2015 Document Reviewed: 06/08/2014 Elsevier Interactive Patient Education  2017 [employer].

## 2022-01-14 NOTE — Progress Notes (Signed)
Subjective:   Manuel Landry is a 76 y.o. male who presents for Medicare Annual/Subsequent preventive examination. I connected with  Jessee Avers on 01/14/22 by a audio enabled telemedicine application and verified that I am speaking with the correct person using two identifiers.  Patient Location: Home  Provider Location: Office/Clinic  I discussed the limitations of evaluation and management by telemedicine. The patient expressed understanding and agreed to proceed.    Review of Systems    Defer to PCP. Cardiac Risk Factors include: advanced age (>97mn, >>47women);dyslipidemia;male gender     Objective:    There were no vitals filed for this visit. There is no height or weight on file to calculate BMI.     01/14/2022   10:26 AM 10/19/2021   10:36 AM 01/08/2021    9:47 AM 12/11/2020   11:12 PM 05/09/2019   10:24 AM 03/30/2019    9:38 AM 04/08/2018    9:15 AM  Advanced Directives  Does Patient Have a Medical Advance Directive? Yes Yes Yes No Yes Yes Yes  Type of AParamedicof [place] will  [place] will  [place] Park[place] will Living will  Does patient want to make changes to medical advance directive? No - Patient declined No - Patient declined   No - Patient declined No - Patient declined   Copy of [place] Chart?   Yes - validated most recent copy scanned in chart (See row information)  Yes - validated most recent copy scanned in chart (See row information) Yes - validated most recent copy scanned in chart (See row information)     Current Medications (verified) Outpatient Encounter Medications as of 01/14/2022  Medication Sig   Apoaequorin (PREVAGEN PO) Take 1 tablet by mouth daily.   azelastine (ASTELIN) 0.1 % nasal spray Place 1 spray into both nostrils 2 (two) times daily. Use in each nostril as directed   cetirizine  (ZYRTEC) 10 MG chewable tablet Chew 10 mg by mouth as needed. PRN   cetirizine-pseudoephedrine (ZYRTEC-D) 5-120 MG tablet Take 1 tablet by mouth as needed for allergies.   Docusate Sodium (COLACE PO) Take 1 capsule by mouth as needed.   fluticasone (FLONASE) 50 MCG/ACT nasal spray SPRAY 2 SPRAYS INTO EACH NOSTRIL EVERY DAY   MELATONIN PO Take 1 tablet by mouth as needed.   Misc Natural Products (NEURIVA PO) Take 1 capsule by mouth daily.   Multiple Vitamins-Minerals (OCUVITE ADULT 50+) CAPS Take 1 capsule by mouth daily.   omeprazole (PRILOSEC) 20 MG capsule Take 20 mg by mouth daily.   Phenylephrine HCl (SUDAFED PE SINUS CONGESTION PO) Take 1 tablet by mouth as needed.   sertraline (ZOLOFT) 50 MG tablet TAKE 1 TABLET BY MOUTH EVERY DAY   Simethicone 500 MG CAPS Take 1 capsule by mouth as needed.   tadalafil (CIALIS) 5 MG tablet Take 5 mg by mouth every evening.   vitamin B-12 (CYANOCOBALAMIN) 1000 MCG tablet Take 1,000 mcg by mouth daily.   [DISCONTINUED] diltiazem (CARDIZEM) 30 MG tablet Take 1 tablet (30 mg total) by mouth daily as needed (HR > 110, and blood pressure is higher than 100/60). (Patient not taking: Reported on 12/22/2021)   [DISCONTINUED] Multiple Vitamins-Minerals (MULTIVITAMIN ADULTS PO) Take 1 tablet by mouth daily. Oxy Boost Nitric Oxide (Patient not taking: Reported on 12/22/2021)   No facility-administered encounter medications on file as of 01/14/2022.    Allergies (verified)  Gabapentin   History: Past Medical History:  Diagnosis Date   Allergy    Anxiety    Arthritis    Blind loop syndrome    Bulging discs    LOWER BACK   Cataract    "beginnings of"   Diverticulosis    Family history of malignant neoplasm of gastrointestinal tract    History of adenomatous polyp of colon    History of Barrett's esophagus    History of chronic gastritis    Hyperlipidemia    no meds taken   IBS (irritable bowel syndrome)    Internal hemorrhoids    Malignant neoplasm of  prostate [facility]) urologist-  dr dahlstedt/  oncologist-  dr Tammi Klippel   dx 08-09-2017-- Stage T2a, Gleason 4+5, PSA 3.22 (on finateride)--- treatment ADT and external beam radiation   PONV (postoperative nausea and vomiting)    Seasonal allergic rhinitis    Tubular adenoma of colon    Wears glasses    Past Surgical History:  Procedure Laterality Date   COLONOSCOPY  last one 10-21-2016  dr Hilarie Fredrickson   CYSTOSCOPY WITH INSERTION OF UROLIFT N/A 12/15/2017   Procedure: CYSTOSCOPY WITH INSERTION OF UROLIFT, BIOPSY  PROSTATIC URETHAL;  Surgeon: Franchot Gallo, MD;  [facility]: [facility];  Service: Urology;  Laterality: N/A;   ESOPHAGOGASTRODUODENOSCOPY (EGD) WITH PROPOFOL  last one 09-26-2013   dr pyrtle   INGUINAL HERNIA REPAIR Bilateral right 10-25-1997:  left 09-28-2008 (dr m. Hassell Done @ [facility])   LAPAROSCOPIC CHOLECYSTECTOMY  2012   MANDIBLE SURGERY Bilateral 1990s   removal calcified bone growths   ROTATOR CUFF REPAIR Right 03-26-2009   dr Tonita Cong  [facility]   SHOULDER OPEN ROTATOR CUFF REPAIR  03/17/2012   Procedure: ROTATOR CUFF REPAIR SHOULDER OPEN;  Surgeon: Johnn Hai, MD;  Location: [facility];  Service: Orthopedics;  Laterality: Left;  LEFT MINI OPEN ROTATOR CUFF REPAIR    SPACE OAR INSTILLATION N/A 12/15/2017   Procedure: SPACE OAR INSTILLATION;  Surgeon: Franchot Gallo, MD;  Location: [facility];  Service: Urology;  Laterality: N/A;   TONSILLECTOMY  child   UPPER GASTROINTESTINAL ENDOSCOPY     Family History  Problem Relation Age of Onset   Colon cancer Mother 31   Diabetes Maternal Grandmother    COPD Sister    Heart failure Father 2   Esophageal cancer Neg Hx    Rectal cancer Neg Hx    Stomach cancer Neg Hx    Social History   Socioeconomic History   Marital status: Married    Spouse name: Caren Griffins   Number of children: 0   Years of education: Not on file   Highest education level: Not on file  Occupational History   Occupation: Retired     Fish farm manager: RETIRED  Tobacco Use   Smoking status: Never   Smokeless tobacco: Never   Tobacco comments:    Never smoke 11/13/21  Vaping Use   Vaping Use: Never used  Substance and Sexual Activity   Alcohol use: Yes    Comment: occasional   Drug use: No   Sexual activity: Yes    Comment: vasectomy 2011  Other Topics Concern   Not on file  Social History Narrative   Not on file   Social Determinants of Health   Financial Resource Strain: Low Risk  (01/08/2021)   Overall Financial Resource Strain (CARDIA)    Difficulty of Paying Living Expenses: Not hard at all  Food Insecurity: No Food Insecurity (01/08/2021)   Hunger Vital  Sign    Worried About Charity fundraiser in the Last Year: Never true    [place] in the Last Year: Never true  Transportation Needs: No Transportation Needs (01/08/2021)   PRAPARE - Hydrologist (Medical): No    Lack of Transportation (Non-Medical): No  Physical Activity: Sufficiently Active (01/08/2021)   Exercise Vital Sign    Days of Exercise per Week: 7 days    Minutes of Exercise per Session: 30 min  Stress: No Stress Concern Present (01/08/2021)   [place]    Feeling of Stress : Not at all  Social Connections: Moderately Integrated (01/08/2021)   Social Connection and Isolation Panel [NHANES]    Frequency of Communication with Friends and Family: More than three times a week    Frequency of Social Gatherings with Friends and Family: More than three times a week    Attends Religious Services: More than 4 times per year    Active Member of [employer] or Organizations: No    Attends Archivist Meetings: Never    Marital Status: Married    Tobacco Counseling Counseling given: Not Answered Tobacco comments: Never smoke 11/13/21   Clinical Intake:  Pre-visit preparation completed: Yes  Pain : No/denies pain     Diabetes: No  How often  do you need to have someone help you when you read instructions, pamphlets, or other written materials from your doctor or pharmacy?: 1 - Never  Diabetic? No  Interpreter Needed?: No  Information entered by :: Beatris Ship, [place]   Activities of Daily Living    01/14/2022   10:27 AM  In your present state of health, do you have any difficulty performing the following activities:  Hearing? 1  Comment wears hearing aids  Vision? 1  Comment currently being treated for cataracts  Difficulty concentrating or making decisions? 0  Walking or climbing stairs? 0  Dressing or bathing? 0  Doing errands, shopping? 0  Preparing Food and eating ? N  Using the Toilet? N  In the past six months, have you accidently leaked urine? N  Do you have problems with loss of bowel control? N  Managing your Medications? N  Managing your Finances? N  Housekeeping or managing your Housekeeping? N    Patient Care Team: [name], DO as PCP - General (Family Medicine) Burnell Blanks, MD as PCP - Cardiology (Cardiology) Franchot Gallo, MD as Consulting Physician (Urology) Karl Luke, MD as Consulting Physician (Optometry) Danella Sensing, MD as Consulting Physician (Dermatology) Burnell Blanks, MD as Consulting Physician (Cardiology) Pyrtle, Lajuan Lines, MD as Consulting Physician (Gastroenterology) Paulla Dolly Tamala Fothergill, DPM as Consulting Physician (Podiatry)  Indicate any recent Medical Services you may have received from other than [facility] providers in the past year (date may be approximate).     Assessment:   This is a routine wellness examination for [place].  Hearing/Vision screen No results found.  Dietary issues and exercise activities discussed: Current Exercise Habits: Home exercise routine, Type of exercise: walking, Time (Minutes): 25, Frequency (Times/Week): 7, Weekly Exercise (Minutes/Week): 175, Intensity: Mild, Exercise limited by: None identified   Goals  Addressed   None    Depression Screen    01/14/2022   10:38 AM 01/08/2021    9:50 AM 09/02/2020   10:23 AM 05/09/2019   10:30 AM 09/07/2017    2:55 PM 10/26/2016    9:37 AM  PHQ  2/9 Scores  PHQ - 2 Score 0 0 0 0 0 0  PHQ- 9 Score      0    Fall Risk    01/14/2022   10:26 AM 01/08/2021    9:49 AM 05/09/2019   10:30 AM 04/11/2019    1:12 PM 09/07/2017    2:55 PM  Fall Risk   Falls in the past year? 1 1 0 1 No  Comment    Emmi Telephone Survey: data to providers prior to load   Number falls in past yr: 0 1  1   Comment    Emmi Telephone Survey Actual Response = 2   Injury with Fall? 0 0  0   Risk for fall due to : No Fall Risks History of fall(s)     Follow up Falls evaluation completed Falls prevention discussed Education provided;Falls prevention discussed      FALL RISK PREVENTION PERTAINING TO THE HOME:  Any stairs in or around the home? Yes  If so, are there any without handrails? No  Home free of loose throw rugs in walkways, pet beds, electrical cords, etc? Yes  Adequate lighting in your home to reduce risk of falls? Yes   ASSISTIVE DEVICES UTILIZED TO PREVENT FALLS:  Life alert? Yes  Use of a cane, walker or w/c? No  Grab bars in the bathroom? Yes  Shower chair or bench in shower? No  Elevated toilet seat or a handicapped toilet? Yes    Cognitive Function:        01/14/2022   10:39 AM  6CIT Screen  What Year? 0 points  What month? 0 points  What time? 0 points  Count back from 20 0 points  Months in reverse 4 points  Repeat phrase 0 points  Total Score 4 points    Immunizations Immunization History  Administered Date(s) Administered   Fluad Quad(high Dose 65+) 03/04/2021   Influenza, High Dose Seasonal PF 03/01/2012, 04/20/2013, 02/16/2014, 03/08/2015, 02/25/2016, 01/27/2017, 03/07/2018, 02/21/2019, 02/13/2020, 03/04/2021   Influenza-Unspecified 03/01/2009, 03/11/2010, 03/10/2011   Moderna Sars-Covid-2 Vaccination 05/16/2019, 06/13/2019    Pneumococcal Conjugate-13 08/15/2013   Pneumococcal Polysaccharide-23 08/05/2011   Td 08/05/2003   Tdap 09/07/2011, 02/23/2019   Tetanus 10/27/2010   Zoster Recombinat (Shingrix) 01/23/2021   Zoster, Live 07/14/2010    TDAP status: Up to date  Flu Vaccine status: Up to date  Pneumococcal vaccine status: Up to date  Covid-19 vaccine status: Information provided on how to obtain vaccines.   Qualifies for Shingles Vaccine? Yes   Zostavax completed Yes   Shingrix Completed?: No.    Education has been provided regarding the importance of this vaccine. Patient has been advised to call insurance company to determine out of pocket expense if they have not yet received this vaccine. Advised may also receive vaccine at local pharmacy or Health Dept. Verbalized acceptance and understanding.  Screening Tests Health Maintenance  Topic Date Due   COVID-19 Vaccine (3 - Moderna series) 08/08/2019   Zoster Vaccines- Shingrix (2 of 2) 03/20/2021   INFLUENZA VACCINE  12/16/2021   COLONOSCOPY (Pts 45-41yr Insurance coverage will need to be confirmed)  01/08/2025   TETANUS/TDAP  02/22/2029   Pneumonia Vaccine 76 Years old  Completed   Hepatitis C Screening  Completed   HPV VACCINES  Aged Out    Health Maintenance  Health Maintenance Due  Topic Date Due   COVID-19 Vaccine (3 - Moderna series) 08/08/2019   Zoster Vaccines- Shingrix (2 of 2) 03/20/2021  INFLUENZA VACCINE  12/16/2021    Colorectal cancer screening: Type of screening: Colonoscopy. Completed 01/09/20. Repeat every 5 years  Lung Cancer Screening: (Low Dose CT Chest recommended if Age 58-80 years, 30 pack-year currently smoking OR have quit w/in 15years.) does not qualify.   Lung Cancer Screening Referral: N/a  Additional Screening:  Hepatitis C Screening: does qualify; Completed 06/27/18  Vision Screening: Recommended annual ophthalmology exams for early detection of glaucoma and other disorders of the eye. Is the patient  up to date with their annual eye exam?  Yes  Who is the provider or what is the name of the office in which the patient attends annual eye exams? [facility] If pt is not established with a provider, would they like to be referred to a provider to establish care? No .   Dental Screening: Recommended annual dental exams for proper oral hygiene  Community Resource Referral / Chronic Care Management: CRR required this visit?  No   CCM required this visit?  No      Plan:     I have personally reviewed and noted the following in the patient's chart:   Medical and social history Use of alcohol, tobacco or illicit drugs  Current medications and supplements including opioid prescriptions. Patient is not currently taking opioid prescriptions. Functional ability and status Nutritional status Physical activity Advanced directives List of other physicians Hospitalizations, surgeries, and ER visits in previous 12 months Vitals Screenings to include cognitive, depression, and falls Referrals and appointments  In addition, I have reviewed and discussed with patient certain preventive protocols, quality metrics, and best practice recommendations. A written personalized care plan for preventive services as well as general preventive health recommendations were provided to patient.   Due to this being a telephonic visit, the after visit summary with patients personalized plan was offered to patient via mail or my-chart. Patient would like to access on my-chart.    Beatris Ship, [place]   01/14/2022   Nurse Notes: None

## 2022-03-02 DIAGNOSIS — C61 Malignant neoplasm of prostate: Secondary | ICD-10-CM | POA: Diagnosis not present

## 2022-03-03 ENCOUNTER — Encounter: Payer: Self-pay | Admitting: Family Medicine

## 2022-03-03 ENCOUNTER — Ambulatory Visit (INDEPENDENT_AMBULATORY_CARE_PROVIDER_SITE_OTHER): Payer: Medicare Other | Admitting: Family Medicine

## 2022-03-03 VITALS — BP 121/71 | HR 90 | Temp 98.1°F | Ht 71.0 in | Wt 178.5 lb

## 2022-03-03 DIAGNOSIS — J302 Other seasonal allergic rhinitis: Secondary | ICD-10-CM | POA: Insufficient documentation

## 2022-03-03 DIAGNOSIS — F411 Generalized anxiety disorder: Secondary | ICD-10-CM

## 2022-03-03 DIAGNOSIS — E782 Mixed hyperlipidemia: Secondary | ICD-10-CM

## 2022-03-03 DIAGNOSIS — H6993 Unspecified Eustachian tube disorder, bilateral: Secondary | ICD-10-CM

## 2022-03-03 LAB — LIPID PANEL
Cholesterol: 197 mg/dL (ref 0–200)
HDL: 67.6 mg/dL (ref >39.00)
LDL Cholesterol: 115 mg/dL — ABNORMAL HIGH (ref 0–99)
NonHDL: 129.77
Total CHOL/HDL Ratio: 3
Triglycerides: 73 mg/dL (ref 0.0–149.0)
VLDL: 14.6 mg/dL (ref 0.0–40.0)

## 2022-03-03 LAB — COMPREHENSIVE METABOLIC PANEL
ALT: 10 U/L (ref 0–53)
AST: 16 U/L (ref 0–37)
Albumin: 4.5 g/dL (ref 3.5–5.2)
Alkaline Phosphatase: 41 U/L (ref 39–117)
BUN: 14 mg/dL (ref 6–23)
CO2: 27 mEq/L (ref 19–32)
Calcium: 9.4 mg/dL (ref 8.4–10.5)
Chloride: 98 mEq/L (ref 96–112)
Creatinine, Ser: 0.87 mg/dL (ref 0.40–1.50)
GFR: 83.8 mL/min (ref >60.00)
Glucose, Bld: 96 mg/dL (ref 70–99)
Potassium: 4.5 mEq/L (ref 3.5–5.1)
Sodium: 132 mEq/L — ABNORMAL LOW (ref 135–145)
Total Bilirubin: 0.7 mg/dL (ref 0.2–1.2)
Total Protein: 6.7 g/dL (ref 6.0–8.3)

## 2022-03-03 MED ORDER — MONTELUKAST SODIUM 10 MG PO TABS: 10.0000 mg | ORAL_TABLET | Freq: Every day | ORAL | 3 refills | Status: DC

## 2022-03-03 MED ORDER — AZELASTINE HCL 0.1 % NA SOLN: 1.0000 | Freq: Two times a day (BID) | NASAL | 3 refills | Status: DC

## 2022-03-03 NOTE — Progress Notes (Signed)
Chief Complaint  Patient presents with   Follow-up    Left ear pain Sleeping in the daytime more    Subjective Manuel Landry presents for f/u anxiety.  Pt is currently being treated with Zoloft 50 mg/d.  Reports doing well since treatment. Compliant, no AE's.  No thoughts of harming self or others. No self-medication with alcohol, prescription drugs or illicit drugs. Pt is not following with a counselor/psychologist.  Seasonal allergies Pt continues to have allergies. He takes Allegra daily in addition to [facility] and Astelin nasal sprays. He saw ENT who told him nothing was wrong from their end. He has never seen a allergies. He got an air purifier which does not seem to help. He continues to have nasal drainage and ear fullness, particularly on the L.   Past Medical History:  Diagnosis Date   Allergy    Anxiety    Arthritis    Blind loop syndrome    Bulging discs    LOWER BACK   Cataract    "beginnings of"   Diverticulosis    Family history of malignant neoplasm of gastrointestinal tract    History of adenomatous polyp of colon    History of Barrett's esophagus    History of chronic gastritis    Hyperlipidemia    no meds taken   IBS (irritable bowel syndrome)    Internal hemorrhoids    Malignant neoplasm of prostate [facility]) urologist-  dr dahlstedt/  oncologist-  dr Tammi Klippel   dx 08-09-2017-- Stage T2a, Gleason 4+5, PSA 3.22 (on finateride)--- treatment ADT and external beam radiation   PONV (postoperative nausea and vomiting)    Seasonal allergic rhinitis    Tubular adenoma of colon    Wears glasses    Allergies as of 03/03/2022       Reactions   Gabapentin    Caused drop in blood pressure and loss of consciousness        Medication List        Accurate as of March 03, 2022  9:52 AM. If you have any questions, ask your nurse or doctor.          azelastine 0.1 % nasal spray Commonly known as: ASTELIN Place 1 spray into both nostrils 2 (two)  times daily. Use in each nostril as directed   cetirizine 10 MG chewable tablet Commonly known as: ZYRTEC Chew 10 mg by mouth as needed. PRN   cetirizine-pseudoephedrine 5-120 MG tablet Commonly known as: ZYRTEC-D Take 1 tablet by mouth as needed for allergies.   COLACE PO Take 1 capsule by mouth as needed.   cyanocobalamin 1000 MCG tablet Commonly known as: VITAMIN B12 Take 1,000 mcg by mouth daily.   fluticasone 50 MCG/ACT nasal spray Commonly known as: FLONASE SPRAY 2 SPRAYS INTO EACH NOSTRIL EVERY DAY   MELATONIN PO Take 1 tablet by mouth as needed.   montelukast 10 MG tablet Commonly known as: SINGULAIR Take 1 tablet (10 mg total) by mouth at bedtime. Started by: [name], DO   NEURIVA PO Take 1 capsule by mouth daily.   Ocuvite Adult 50+ Caps Take 1 capsule by mouth daily.   Omega-3 1000 MG Caps Take 1 capsule by mouth daily.   omeprazole 20 MG capsule Commonly known as: PRILOSEC Take 20 mg by mouth daily.   PREVAGEN PO Take 1 tablet by mouth daily.   sertraline 50 MG tablet Commonly known as: ZOLOFT TAKE 1 TABLET BY MOUTH EVERY DAY   Simethicone 500 MG  Caps Take 1 capsule by mouth as needed.   SUDAFED PE SINUS CONGESTION PO Take 1 tablet by mouth as needed.   tadalafil 5 MG tablet Commonly known as: CIALIS Take 5 mg by mouth every evening.   vitamin E 1000 UNIT capsule Take 1,000 Units by mouth daily.        Exam BP 121/71 (BP Location: Left Arm, Patient Position: Sitting, Cuff Size: Normal)   Pulse 90   Temp 98.1 F (36.7 C) (Oral)   Ht '5\' 11"'$  (1.803 m)   Wt 178 lb 8 oz (81 kg)   SpO2 95%   BMI 24.90 kg/m  General:  well developed, well nourished, in no apparent distress Heart: RRR, no LE edema HEENT: canals patent, no otorrhea, TM's neg, nares patent, no rhinorrhea, MMM, no pharyngeal exudate or erythema Lungs:  CTAB. No respiratory distress Psych: well oriented with normal range of affect and age-appropriate  judgement/insight, alert and oriented x4.  Assessment and Plan  GAD (generalized anxiety disorder)  Seasonal allergies - Plan: montelukast (SINGULAIR) 10 MG tablet  Mixed hyperlipidemia - Plan: Comprehensive metabolic panel, Lipid panel  Dysfunction of both eustachian tubes - Plan: azelastine (ASTELIN) 0.1 % nasal spray  Chronic, stable. Cont Zoloft 50 mg/d.  Chronic, unstable. Cont Allegra, INCS, Astelin, add Singulair 10 mg/d. If no improvement over the next few weeks, will refer to allergy.  Ck labs. Counseled on diet/exercise. F/u in 6 mo or prn. The patient voiced understanding and agreement to the plan.  [place], DO 03/03/22 9:52 AM

## 2022-03-03 NOTE — Patient Instructions (Signed)
Give us 2-3 business days to get the results of your labs back.   Keep the diet clean and stay active.  Let us know if you need anything. 

## 2022-03-09 DIAGNOSIS — N400 Enlarged prostate without lower urinary tract symptoms: Secondary | ICD-10-CM | POA: Diagnosis not present

## 2022-03-09 DIAGNOSIS — N5201 Erectile dysfunction due to arterial insufficiency: Secondary | ICD-10-CM | POA: Diagnosis not present

## 2022-03-09 DIAGNOSIS — Z8546 Personal history of malignant neoplasm of prostate: Secondary | ICD-10-CM | POA: Diagnosis not present

## 2022-03-10 ENCOUNTER — Other Ambulatory Visit: Payer: Self-pay | Admitting: Family Medicine

## 2022-03-10 ENCOUNTER — Other Ambulatory Visit (HOSPITAL_BASED_OUTPATIENT_CLINIC_OR_DEPARTMENT_OTHER): Payer: Self-pay

## 2022-03-10 DIAGNOSIS — J302 Other seasonal allergic rhinitis: Secondary | ICD-10-CM

## 2022-03-10 MED ORDER — TADALAFIL 5 MG PO TABS
5.0000 mg | ORAL_TABLET | Freq: Every day | ORAL | 3 refills | Status: DC
  Filled 2022-03-10: qty 90, 90d supply, fill #0
  Filled 2022-06-08: qty 90, 90d supply, fill #1
  Filled 2022-10-21: qty 90, 90d supply, fill #2
  Filled 2023-01-19: qty 90, 90d supply, fill #3

## 2022-04-20 ENCOUNTER — Other Ambulatory Visit: Payer: Self-pay | Admitting: Family Medicine

## 2022-05-18 HISTORY — PX: CATARACT EXTRACTION: SUR2

## 2022-05-28 ENCOUNTER — Other Ambulatory Visit: Payer: Self-pay | Admitting: Family Medicine

## 2022-05-28 DIAGNOSIS — F418 Other specified anxiety disorders: Secondary | ICD-10-CM

## 2022-06-04 ENCOUNTER — Encounter: Payer: Self-pay | Admitting: Family Medicine

## 2022-06-08 ENCOUNTER — Other Ambulatory Visit (HOSPITAL_BASED_OUTPATIENT_CLINIC_OR_DEPARTMENT_OTHER): Payer: Self-pay

## 2022-06-08 NOTE — Progress Notes (Signed)
Subjective:   By signing my name below, I, Marlana Latus, attest that this documentation has been prepared under the direction and in the presence of Nance Pear, NP 06/09/2022   Patient ID: Manuel Landry, male    DOB: [birth year], 77 y.o.   MRN: [id number]  Chief Complaint  Patient presents with  . Nausea    Complains of nausea on and off for about 2 weeks    HPI Patient is in today for a sick visit.  Nausea: He reports that he has been experiencing nausea for about a week. He described this as feeling "queezy" and noted that he has not vomited. His wife gave him a pill that helped him for a bit, but he is unawake of the exact name of the medication. His wife had a strain of the flu around the holidays to the point of dehydration due to vomiting. He believes his symptoms may be related.  He has been eating and drinking regularly. He endorses having normal bowel movements. He denies any fever or abdominal pain.   Past Medical History:  Diagnosis Date  . Allergy   . Anxiety   . Arthritis   . Blind loop syndrome   . Bulging discs    LOWER BACK  . Cataract    "beginnings of"  . Diverticulosis   . Family history of malignant neoplasm of gastrointestinal tract   . History of adenomatous polyp of colon   . History of Barrett's esophagus   . History of chronic gastritis   . Hyperlipidemia    no meds taken  . IBS (irritable bowel syndrome)   . Internal hemorrhoids   . Malignant neoplasm of prostate [facility]) urologist-  dr dahlstedt/  oncologist-  dr Tammi Klippel   dx 08-09-2017-- Stage T2a, Gleason 4+5, PSA 3.22 (on finateride)--- treatment ADT and external beam radiation  . PONV (postoperative nausea and vomiting)   . Seasonal allergic rhinitis   . Tubular adenoma of colon   . Wears glasses     Past Surgical History:  Procedure Laterality Date  . COLONOSCOPY  last one 10-21-2016  dr pyrtle  . CYSTOSCOPY WITH INSERTION OF UROLIFT N/A 12/15/2017   Procedure: CYSTOSCOPY  WITH INSERTION OF UROLIFT, BIOPSY  PROSTATIC URETHAL;  Surgeon: Franchot Gallo, MD;  Location: [facility];  Service: Urology;  Laterality: N/A;  . ESOPHAGOGASTRODUODENOSCOPY (EGD) WITH PROPOFOL  last one 09-26-2013   dr pyrtle  . INGUINAL HERNIA REPAIR Bilateral right 10-25-1997:  left 09-28-2008 (dr m. Hassell Done @ [facility])  . LAPAROSCOPIC CHOLECYSTECTOMY  2012  . MANDIBLE SURGERY Bilateral 1990s   removal calcified bone growths  . ROTATOR CUFF REPAIR Right 03-26-2009   dr Tonita Cong  [facility]  . SHOULDER OPEN ROTATOR CUFF REPAIR  03/17/2012   Procedure: ROTATOR CUFF REPAIR SHOULDER OPEN;  Surgeon: Johnn Hai, MD;  Location: [facility];  Service: Orthopedics;  Laterality: Left;  LEFT MINI OPEN ROTATOR CUFF REPAIR   . SPACE OAR INSTILLATION N/A 12/15/2017   Procedure: SPACE OAR INSTILLATION;  Surgeon: Franchot Gallo, MD;  Location: [facility];  Service: Urology;  Laterality: N/A;  . TONSILLECTOMY  child  . UPPER GASTROINTESTINAL ENDOSCOPY      Family History  Problem Relation Age of Onset  . Colon cancer Mother 44  . Diabetes Maternal Grandmother   . COPD Sister   . Heart failure Father 43  . Esophageal cancer Neg Hx   . Rectal cancer Neg Hx   . Stomach cancer  Neg Hx     Social History   Socioeconomic History  . Marital status: Married    Spouse name: Caren Griffins  . Number of children: 0  . Years of education: Not on file  . Highest education level: Not on file  Occupational History  . Occupation: Retired    Fish farm manager: RETIRED  Tobacco Use  . Smoking status: Never  . Smokeless tobacco: Never  . Tobacco comments:    Never smoke 11/13/21  Vaping Use  . Vaping Use: Never used  Substance and Sexual Activity  . Alcohol use: Yes    Comment: occasional  . Drug use: No  . Sexual activity: Yes    Comment: vasectomy 2011  Other Topics Concern  . Not on file  Social History Narrative  . Not on file   Social Determinants of Health   Financial Resource  Strain: Low Risk  (01/08/2021)   Overall Financial Resource Strain (CARDIA)   . Difficulty of Paying Living Expenses: Not hard at all  Food Insecurity: No Food Insecurity (01/08/2021)   Hunger Vital Sign   . Worried About Charity fundraiser in the Last Year: Never true   . Ran Out of Food in the Last Year: Never true  Transportation Needs: No Transportation Needs (01/08/2021)   PRAPARE - Transportation   . Lack of Transportation (Medical): No   . Lack of Transportation (Non-Medical): No  Physical Activity: Sufficiently Active (01/08/2021)   Exercise Vital Sign   . Days of Exercise per Week: 7 days   . Minutes of Exercise per Session: 30 min  Stress: No Stress Concern Present (01/08/2021)   [place]   . Feeling of Stress : Not at all  Social Connections: Moderately Integrated (01/08/2021)   Social Connection and Isolation Panel [NHANES]   . Frequency of Communication with Friends and Family: More than three times a week   . Frequency of Social Gatherings with Friends and Family: More than three times a week   . Attends Religious Services: More than 4 times per year   . Active Member of Clubs or Organizations: No   . Attends Archivist Meetings: Never   . Marital Status: Married  Human resources officer Violence: Not At Risk (01/08/2021)   Humiliation, Afraid, Rape, and Kick questionnaire   . Fear of Current or Ex-Partner: No   . Emotionally Abused: No   . Physically Abused: No   . Sexually Abused: No    Outpatient Medications Prior to Visit  Medication Sig Dispense Refill  . Apoaequorin (PREVAGEN PO) Take 1 tablet by mouth daily.    Marland Kitchen azelastine (ASTELIN) 0.1 % nasal spray Place 1 spray into both nostrils 2 (two) times daily. Use in each nostril as directed 90 mL 3  . cetirizine (ZYRTEC) 10 MG chewable tablet Chew 10 mg by mouth as needed. PRN    . cetirizine-pseudoephedrine (ZYRTEC-D) 5-120 MG tablet Take 1 tablet  by mouth as needed for allergies.    Mariane Baumgarten Sodium (COLACE PO) Take 1 capsule by mouth as needed.    . fluticasone (FLONASE) 50 MCG/ACT nasal spray SPRAY 2 SPRAYS INTO EACH NOSTRIL EVERY DAY 48 mL 2  . MELATONIN PO Take 1 tablet by mouth as needed.    . Misc Natural Products (NEURIVA PO) Take 1 capsule by mouth daily.    . montelukast (SINGULAIR) 10 MG tablet TAKE 1 TABLET BY MOUTH EVERYDAY AT BEDTIME 90 tablet 1  . Multiple  Vitamins-Minerals (OCUVITE ADULT 50+) CAPS Take 1 capsule by mouth daily.    . Omega-3 1000 MG CAPS Take 1 capsule by mouth daily.    Marland Kitchen Phenylephrine HCl (SUDAFED PE SINUS CONGESTION PO) Take 1 tablet by mouth as needed.    . sertraline (ZOLOFT) 50 MG tablet TAKE 1 TABLET BY MOUTH EVERY DAY 90 tablet 2  . Simethicone 500 MG CAPS Take 1 capsule by mouth as needed.    . tadalafil (CIALIS) 5 MG tablet Take 1 tablet (5 mg total) by mouth daily. 90 tablet 3  . vitamin B-12 (CYANOCOBALAMIN) 1000 MCG tablet Take 1,000 mcg by mouth daily.    . vitamin E 1000 UNIT capsule Take 1,000 Units by mouth daily.    Marland Kitchen omeprazole (PRILOSEC) 20 MG capsule Take 20 mg by mouth daily.    . tadalafil (CIALIS) 5 MG tablet Take 5 mg by mouth every evening.     No facility-administered medications prior to visit.    Allergies  Allergen Reactions  . Gabapentin     Caused drop in blood pressure and loss of consciousness    Review of Systems  Constitutional:  Negative for fever.  Gastrointestinal:  Positive for nausea. Negative for abdominal pain.       Objective:    Physical Exam Constitutional:      General: He is not in acute distress.    Appearance: Normal appearance. He is not ill-appearing.  HENT:     Head: Normocephalic and atraumatic.     Right Ear: External ear normal.     Left Ear: External ear normal.  Eyes:     Extraocular Movements: Extraocular movements intact.     Pupils: Pupils are equal, round, and reactive to light.  Cardiovascular:     Rate and Rhythm:  Normal rate and regular rhythm.     Heart sounds: Normal heart sounds. No murmur heard.    No gallop.  Pulmonary:     Effort: Pulmonary effort is normal. No respiratory distress.     Breath sounds: Normal breath sounds. No wheezing or rales.  Abdominal:     General: Bowel sounds are normal.     Palpations: Abdomen is soft.     Tenderness: There is no abdominal tenderness.  Skin:    General: Skin is warm and dry.  Neurological:     Mental Status: He is alert and oriented to person, place, and time.  Psychiatric:        Judgment: Judgment normal.    BP (!) 164/96 (BP Location: Left Arm, Patient Position: Sitting, Cuff Size: Small)   Pulse 86   Temp 98.6 F (37 C) (Oral)   Resp 16   Wt 182 lb (82.6 kg)   SpO2 98%   BMI 25.38 kg/m  Wt Readings from Last 3 Encounters:  06/09/22 182 lb (82.6 kg)  03/03/22 178 lb 8 oz (81 kg)  12/22/21 176 lb 6.4 oz (80 kg)      Assessment & Plan:   Problem List Items Addressed This Visit       Unprioritized   Nausea - Primary    New.  Symptoms are mild and he is tolerating PO's. Suspect most likely cause is viral gastroenteritis.  Will check labs as ordered.  Zofran prn.  Pt is advised to let us know if symptoms are not improved in 1 week. Would consider additional work up at that time.       Relevant Medications   ondansetron (ZOFRAN) 4 MG tablet  Other Relevant Orders   Comp Met (CMET)   CBC w/Diff   Lipase   Meds ordered this encounter  Medications  . ondansetron (ZOFRAN) 4 MG tablet    Sig: Take 1 tablet (4 mg total) by mouth every 8 (eight) hours as needed for nausea or vomiting.    Dispense:  20 tablet    Refill:  0    Order Specific Question:   Supervising Provider    Answer:   Penni Homans A [[id number]]  . omeprazole (PRILOSEC) 40 MG capsule    Sig: Take 1 capsule (40 mg total) by mouth daily.    Dispense:  90 capsule    Refill:  0    Order Specific Question:   Supervising Provider    Answer:   Penni Homans A [[id number]]    I, Nance Pear, NP, personally preformed the services described in this documentation.  All medical record entries made by the scribe were at my direction and in my presence.  I have reviewed the chart and discharge instructions (if applicable) and agree that the record reflects my personal performance and is accurate and complete. 06/09/2022  I,Rachel Rivera,acting as a scribe for Nance Pear, NP.,have documented all relevant documentation on the behalf of Nance Pear, NP,as directed by  Nance Pear, NP while in the presence of Nance Pear, NP.  ***  Nance Pear, NP

## 2022-06-09 ENCOUNTER — Other Ambulatory Visit (HOSPITAL_BASED_OUTPATIENT_CLINIC_OR_DEPARTMENT_OTHER): Payer: Self-pay

## 2022-06-09 ENCOUNTER — Ambulatory Visit (INDEPENDENT_AMBULATORY_CARE_PROVIDER_SITE_OTHER): Payer: Medicare Other | Admitting: Family

## 2022-06-09 VITALS — BP 164/96 | HR 86 | Temp 98.6°F | Resp 16 | Wt 182.0 lb

## 2022-06-09 DIAGNOSIS — K2970 Gastritis, unspecified, without bleeding: Secondary | ICD-10-CM | POA: Diagnosis not present

## 2022-06-09 DIAGNOSIS — R030 Elevated blood-pressure reading, without diagnosis of hypertension: Secondary | ICD-10-CM

## 2022-06-09 DIAGNOSIS — R110 Nausea: Secondary | ICD-10-CM

## 2022-06-09 DIAGNOSIS — K2990 Gastroduodenitis, unspecified, without bleeding: Secondary | ICD-10-CM

## 2022-06-09 MED ORDER — ONDANSETRON HCL 4 MG PO TABS
4.0000 mg | ORAL_TABLET | Freq: Three times a day (TID) | ORAL | 0 refills | Status: DC | PRN
  Filled 2022-06-09: qty 20, 7d supply, fill #0

## 2022-06-09 MED ORDER — OMEPRAZOLE 40 MG PO CPDR
40.0000 mg | DELAYED_RELEASE_CAPSULE | Freq: Every day | ORAL | 0 refills | Status: DC
  Filled 2022-06-09: qty 90, 90d supply, fill #0

## 2022-06-09 NOTE — Assessment & Plan Note (Signed)
New.  Symptoms are mild and he is tolerating PO's. Suspect most likely cause is viral gastroenteritis.  Will check labs as ordered.  Zofran prn.  Pt is advised to let us know if symptoms are not improved in 1 week. Would consider additional work up at that time.

## 2022-06-09 NOTE — Assessment & Plan Note (Signed)
Noted on previous EGD's.  This could also be a contributing factore to his nausea. He is currently on omeprazole '20mg'$ .  Will increase to '40mg'$  once daily. If no improvement, consider referral back to GI.

## 2022-06-10 LAB — CBC WITH DIFFERENTIAL/PLATELET
Basophils Absolute: 0 10*3/uL (ref 0.0–0.1)
Basophils Relative: 0.9 % (ref 0.0–3.0)
Eosinophils Absolute: 0.2 10*3/uL (ref 0.0–0.7)
Eosinophils Relative: 3.6 % (ref 0.0–5.0)
HCT: 42.6 % (ref 39.0–52.0)
Hemoglobin: 14.5 g/dL (ref 13.0–17.0)
Lymphocytes Relative: 13.6 % (ref 12.0–46.0)
Lymphs Abs: 0.7 10*3/uL (ref 0.7–4.0)
MCHC: 34 g/dL (ref 30.0–36.0)
MCV: 90.7 fl (ref 78.0–100.0)
Monocytes Absolute: 0.5 10*3/uL (ref 0.1–1.0)
Monocytes Relative: 10.6 % (ref 3.0–12.0)
Neutro Abs: 3.6 10*3/uL (ref 1.4–7.7)
Neutrophils Relative %: 71.3 % (ref 43.0–77.0)
Platelets: 261 10*3/uL (ref 150.0–400.0)
RBC: 4.7 Mil/uL (ref 4.22–5.81)
RDW: 13.9 % (ref 11.5–15.5)
WBC: 5.1 10*3/uL (ref 4.0–10.5)

## 2022-06-10 LAB — COMPREHENSIVE METABOLIC PANEL
ALT: 10 U/L (ref 0–53)
AST: 16 U/L (ref 0–37)
Albumin: 4.4 g/dL (ref 3.5–5.2)
Alkaline Phosphatase: 41 U/L (ref 39–117)
BUN: 18 mg/dL (ref 6–23)
CO2: 30 mEq/L (ref 19–32)
Calcium: 9.4 mg/dL (ref 8.4–10.5)
Chloride: 98 mEq/L (ref 96–112)
Creatinine, Ser: 0.92 mg/dL (ref 0.40–1.50)
GFR: 80.64 mL/min (ref >60.00)
Glucose, Bld: 90 mg/dL (ref 70–99)
Potassium: 4.6 mEq/L (ref 3.5–5.1)
Sodium: 134 mEq/L — ABNORMAL LOW (ref 135–145)
Total Bilirubin: 0.5 mg/dL (ref 0.2–1.2)
Total Protein: 6.8 g/dL (ref 6.0–8.3)

## 2022-06-10 LAB — LIPASE: Lipase: 41 U/L (ref 11.0–59.0)

## 2022-06-11 DIAGNOSIS — R030 Elevated blood-pressure reading, without diagnosis of hypertension: Secondary | ICD-10-CM | POA: Insufficient documentation

## 2022-06-11 NOTE — Assessment & Plan Note (Signed)
BP Readings from Last 3 Encounters:  06/09/22 (!) 164/96  03/03/22 121/71  12/22/21 130/82   BP is elevated today on both readings which is unusual for I'm. Recommended follow up with PCP in 1 month for bp recheck.

## 2022-07-10 ENCOUNTER — Ambulatory Visit (INDEPENDENT_AMBULATORY_CARE_PROVIDER_SITE_OTHER): Payer: Medicare Other | Admitting: Family Medicine

## 2022-07-10 ENCOUNTER — Encounter: Payer: Self-pay | Admitting: Family Medicine

## 2022-07-10 VITALS — BP 128/78 | HR 90 | Temp 97.8°F | Ht 71.0 in | Wt 181.2 lb

## 2022-07-10 DIAGNOSIS — Z09 Encounter for follow-up examination after completed treatment for conditions other than malignant neoplasm: Secondary | ICD-10-CM

## 2022-07-10 DIAGNOSIS — R110 Nausea: Secondary | ICD-10-CM | POA: Diagnosis not present

## 2022-07-10 NOTE — Patient Instructions (Signed)
Blood pressure looks good.  Let me know if you get nausea again.   Let us know if you need anything.

## 2022-07-10 NOTE — Progress Notes (Signed)
Chief Complaint  Patient presents with   Follow-up    1 month     Subjective: Patient is a 77 y.o. male here for f/u.  BP elevated 1 mo ago. Checking BP at home, running at 110-120's/70-80's. Diet is fair. No Cp or SOB.  Had some nausea. Was given a PPI which he was already taking. Zofran gave him constipation. Has queasiness in AM, not nausea anymore.   Past Medical History:  Diagnosis Date   Allergy    Anxiety    Arthritis    Blind loop syndrome    Bulging discs    LOWER BACK   Cataract    "beginnings of"   Diverticulosis    Family history of malignant neoplasm of gastrointestinal tract    History of adenomatous polyp of colon    History of Barrett's esophagus    History of chronic gastritis    Hyperlipidemia    no meds taken   IBS (irritable bowel syndrome)    Internal hemorrhoids    Malignant neoplasm of prostate [facility]) urologist-  dr dahlstedt/  oncologist-  dr Tammi Klippel   dx 08-09-2017-- Stage T2a, Gleason 4+5, PSA 3.22 (on finateride)--- treatment ADT and external beam radiation   PONV (postoperative nausea and vomiting)    Seasonal allergic rhinitis    Tubular adenoma of colon    Wears glasses     Objective: BP 128/78 (BP Location: Left Arm, Patient Position: Sitting, Cuff Size: Normal)   Pulse 90   Temp 97.8 F (36.6 C) (Oral)   Ht '5\' 11"'$  (1.803 m)   Wt 181 lb 4 oz (82.2 kg)   SpO2 94%   BMI 25.28 kg/m  General: Awake, appears stated age Heart: RRR, no LE edema Abd: BS+, S, NT, ND Lungs: CTAB, no rales, wheezes or rhonchi. No accessory muscle use Psych: Age appropriate judgment and insight, normal affect and mood  Assessment and Plan: Follow-up for resolved condition  Queasiness  BP looks good today. Home readings stable.  Discussed cont PPI. Could consider phenergan prn, declined for now.  The patient voiced understanding and agreement to the plan.  [place], DO 07/10/22  9:47 AM

## 2022-07-23 DIAGNOSIS — H524 Presbyopia: Secondary | ICD-10-CM | POA: Diagnosis not present

## 2022-07-23 DIAGNOSIS — H5213 Myopia, bilateral: Secondary | ICD-10-CM | POA: Diagnosis not present

## 2022-07-23 DIAGNOSIS — H18593 Other hereditary corneal dystrophies, bilateral: Secondary | ICD-10-CM | POA: Diagnosis not present

## 2022-07-23 DIAGNOSIS — H25813 Combined forms of age-related cataract, bilateral: Secondary | ICD-10-CM | POA: Diagnosis not present

## 2022-07-23 DIAGNOSIS — H43813 Vitreous degeneration, bilateral: Secondary | ICD-10-CM | POA: Diagnosis not present

## 2022-07-23 DIAGNOSIS — H52223 Regular astigmatism, bilateral: Secondary | ICD-10-CM | POA: Diagnosis not present

## 2022-08-19 DIAGNOSIS — H25811 Combined forms of age-related cataract, right eye: Secondary | ICD-10-CM | POA: Diagnosis not present

## 2022-08-19 DIAGNOSIS — H25813 Combined forms of age-related cataract, bilateral: Secondary | ICD-10-CM | POA: Diagnosis not present

## 2022-08-31 ENCOUNTER — Encounter: Payer: Self-pay | Admitting: *Deleted

## 2022-09-01 DIAGNOSIS — H269 Unspecified cataract: Secondary | ICD-10-CM | POA: Diagnosis not present

## 2022-09-01 DIAGNOSIS — H268 Other specified cataract: Secondary | ICD-10-CM | POA: Diagnosis not present

## 2022-09-01 DIAGNOSIS — H25811 Combined forms of age-related cataract, right eye: Secondary | ICD-10-CM | POA: Diagnosis not present

## 2022-09-02 ENCOUNTER — Other Ambulatory Visit (HOSPITAL_BASED_OUTPATIENT_CLINIC_OR_DEPARTMENT_OTHER): Payer: Self-pay

## 2022-09-02 ENCOUNTER — Ambulatory Visit: Payer: Medicare Other | Admitting: Family Medicine

## 2022-09-02 MED ORDER — TIMOLOL MALEATE 0.5 % OP SOLN
1.0000 [drp] | Freq: Two times a day (BID) | OPHTHALMIC | 0 refills | Status: DC
  Filled 2022-09-02: qty 5, 50d supply, fill #0

## 2022-09-07 DIAGNOSIS — H25812 Combined forms of age-related cataract, left eye: Secondary | ICD-10-CM | POA: Diagnosis not present

## 2022-09-08 DIAGNOSIS — C61 Malignant neoplasm of prostate: Secondary | ICD-10-CM | POA: Diagnosis not present

## 2022-10-06 DIAGNOSIS — H268 Other specified cataract: Secondary | ICD-10-CM | POA: Diagnosis not present

## 2022-10-06 DIAGNOSIS — H25812 Combined forms of age-related cataract, left eye: Secondary | ICD-10-CM | POA: Diagnosis not present

## 2022-10-21 ENCOUNTER — Other Ambulatory Visit (HOSPITAL_BASED_OUTPATIENT_CLINIC_OR_DEPARTMENT_OTHER): Payer: Self-pay

## 2022-11-03 DIAGNOSIS — D1801 Hemangioma of skin and subcutaneous tissue: Secondary | ICD-10-CM | POA: Diagnosis not present

## 2022-11-03 DIAGNOSIS — D2271 Melanocytic nevi of right lower limb, including hip: Secondary | ICD-10-CM | POA: Diagnosis not present

## 2022-11-03 DIAGNOSIS — D225 Melanocytic nevi of trunk: Secondary | ICD-10-CM | POA: Diagnosis not present

## 2022-11-03 DIAGNOSIS — L820 Inflamed seborrheic keratosis: Secondary | ICD-10-CM | POA: Diagnosis not present

## 2022-11-03 DIAGNOSIS — Z85828 Personal history of other malignant neoplasm of skin: Secondary | ICD-10-CM | POA: Diagnosis not present

## 2022-11-03 DIAGNOSIS — L821 Other seborrheic keratosis: Secondary | ICD-10-CM | POA: Diagnosis not present

## 2022-11-03 DIAGNOSIS — D692 Other nonthrombocytopenic purpura: Secondary | ICD-10-CM | POA: Diagnosis not present

## 2022-11-03 DIAGNOSIS — D2272 Melanocytic nevi of left lower limb, including hip: Secondary | ICD-10-CM | POA: Diagnosis not present

## 2022-11-03 DIAGNOSIS — L570 Actinic keratosis: Secondary | ICD-10-CM | POA: Diagnosis not present

## 2023-01-14 ENCOUNTER — Other Ambulatory Visit: Payer: Self-pay | Admitting: Family Medicine

## 2023-01-19 ENCOUNTER — Telehealth: Payer: Self-pay | Admitting: Family Medicine

## 2023-01-19 ENCOUNTER — Other Ambulatory Visit (HOSPITAL_BASED_OUTPATIENT_CLINIC_OR_DEPARTMENT_OTHER): Payer: Self-pay

## 2023-01-19 NOTE — Telephone Encounter (Signed)
Copied from CRM [id number]. Topic: Medicare AWV >> Jan 19, 2023  1:53 PM Payton Doughty wrote: Reason for CRM: LM 01/19/2023 to schedule AWV   Verlee Rossetti; Care Guide Ambulatory Clinical Support [place] [facility] Direct Dial: [phone]

## 2023-01-21 ENCOUNTER — Ambulatory Visit (INDEPENDENT_AMBULATORY_CARE_PROVIDER_SITE_OTHER): Payer: Medicare Other | Admitting: *Deleted

## 2023-01-21 VITALS — Ht 71.0 in | Wt 178.0 lb

## 2023-01-21 DIAGNOSIS — Z0000 Encounter for general adult medical examination without abnormal findings: Secondary | ICD-10-CM

## 2023-01-21 NOTE — Patient Instructions (Signed)
Mr. Manuel Landry , Thank you for taking time to come for your Medicare Wellness Visit. I appreciate your ongoing commitment to your health goals. Please review the following plan we discussed and let me know if I can assist you in the future.     This is a list of the screening recommended for you and due dates:  Health Maintenance  Topic Date Due   COVID-19 Vaccine (3 - 2023-24 season) 01/17/2023   Flu Shot  08/16/2023*   Medicare Annual Wellness Visit  01/21/2024   Colon Cancer Screening  01/08/2025   DTaP/Tdap/Td vaccine (5 - Td or Tdap) 02/22/2029   Pneumonia Vaccine  Completed   Hepatitis C Screening  Completed   Zoster (Shingles) Vaccine  Completed   HPV Vaccine  Aged Out  *Topic was postponed. The date shown is not the original due date.    Next appointment: Follow up in one year for your annual wellness visit.   Preventive Care 77 Years and Older, Male Preventive care refers to lifestyle choices and visits with your health care provider that can promote health and wellness. What does preventive care include? A yearly physical exam. This is also called an annual well check. Dental exams once or twice a year. Routine eye exams. Ask your health care provider how often you should have your eyes checked. Personal lifestyle choices, including: Daily care of your teeth and gums. Regular physical activity. Eating a healthy diet. Avoiding tobacco and drug use. Limiting alcohol use. Practicing safe sex. Taking low doses of aspirin every day. Taking vitamin and mineral supplements as recommended by your health care provider. What happens during an annual well check? The services and screenings done by your health care provider during your annual well check will depend on your age, overall health, lifestyle risk factors, and family history of disease. Counseling  Your health care provider may ask you questions about your: Alcohol use. Tobacco use. Drug use. Emotional  well-being. Home and relationship well-being. Sexual activity. Eating habits. History of falls. Memory and ability to understand (cognition). Work and work Astronomer. Screening  You may have the following tests or measurements: Height, weight, and BMI. Blood pressure. Lipid and cholesterol levels. These may be checked every 5 years, or more frequently if you are over 77 years old. Skin check. Lung cancer screening. You may have this screening every year starting at age 77 if you have a 30-pack-year history of smoking and currently smoke or have quit within the past 15 years. Fecal occult blood test (FOBT) of the stool. You may have this test every year starting at age 77. Flexible sigmoidoscopy or colonoscopy. You may have a sigmoidoscopy every 5 years or a colonoscopy every 10 years starting at age 77. Prostate cancer screening. Recommendations will vary depending on your family history and other risks. Hepatitis C blood test. Hepatitis B blood test. Sexually transmitted disease (STD) testing. Diabetes screening. This is done by checking your blood sugar (glucose) after you have not eaten for a while (fasting). You may have this done every 1-3 years. Abdominal aortic aneurysm (AAA) screening. You may need this if you are a current or former smoker. Osteoporosis. You may be screened starting at age 77 if you are at high risk. Talk with your health care provider about your test results, treatment options, and if necessary, the need for more tests. Vaccines  Your health care provider may recommend certain vaccines, such as: Influenza vaccine. This is recommended every year. Tetanus, diphtheria, and acellular  pertussis (Tdap, Td) vaccine. You may need a Td booster every 10 years. Zoster vaccine. You may need this after age 39. Pneumococcal 13-valent conjugate (PCV13) vaccine. One dose is recommended after age 17. Pneumococcal polysaccharide (PPSV23) vaccine. One dose is recommended after  age 77. Talk to your health care provider about which screenings and vaccines you need and how often you need them. This information is not intended to replace advice given to you by your health care provider. Make sure you discuss any questions you have with your health care provider. Document Released: 05/31/2015 Document Revised: 01/22/2016 Document Reviewed: 03/05/2015 Elsevier Interactive Patient Education  2017 [employer].  Fall Prevention in the Home Falls can cause injuries. They can happen to people of all ages. There are many things you can do to make your home safe and to help prevent falls. What can I do on the outside of my home? Regularly fix the edges of walkways and driveways and fix any cracks. Remove anything that might make you trip as you walk through a door, such as a raised step or threshold. Trim any bushes or trees on the path to your home. Use bright outdoor lighting. Clear any walking paths of anything that might make someone trip, such as rocks or tools. Regularly check to see if handrails are loose or broken. Make sure that both sides of any steps have handrails. Any raised decks and porches should have guardrails on the edges. Have any leaves, snow, or ice cleared regularly. Use sand or salt on walking paths during winter. Clean up any spills in your garage right away. This includes oil or grease spills. What can I do in the bathroom? Use night lights. Install grab bars by the toilet and in the tub and shower. Do not use towel bars as grab bars. Use non-skid mats or decals in the tub or shower. If you need to sit down in the shower, use a plastic, non-slip stool. Keep the floor dry. Clean up any water that spills on the floor as soon as it happens. Remove soap buildup in the tub or shower regularly. Attach bath mats securely with double-sided non-slip rug tape. Do not have throw rugs and other things on the floor that can make you trip. What can I do in the  bedroom? Use night lights. Make sure that you have a light by your bed that is easy to reach. Do not use any sheets or blankets that are too big for your bed. They should not hang down onto the floor. Have a firm chair that has side arms. You can use this for support while you get dressed. Do not have throw rugs and other things on the floor that can make you trip. What can I do in the kitchen? Clean up any spills right away. Avoid walking on wet floors. Keep items that you use a lot in easy-to-reach places. If you need to reach something above you, use a strong step stool that has a grab bar. Keep electrical cords out of the way. Do not use floor polish or wax that makes floors slippery. If you must use wax, use non-skid floor wax. Do not have throw rugs and other things on the floor that can make you trip. What can I do with my stairs? Do not leave any items on the stairs. Make sure that there are handrails on both sides of the stairs and use them. Fix handrails that are broken or loose. Make sure that handrails  are as long as the stairways. Check any carpeting to make sure that it is firmly attached to the stairs. Fix any carpet that is loose or worn. Avoid having throw rugs at the top or bottom of the stairs. If you do have throw rugs, attach them to the floor with carpet tape. Make sure that you have a light switch at the top of the stairs and the bottom of the stairs. If you do not have them, ask someone to add them for you. What else can I do to help prevent falls? Wear shoes that: Do not have high heels. Have rubber bottoms. Are comfortable and fit you well. Are closed at the toe. Do not wear sandals. If you use a stepladder: Make sure that it is fully opened. Do not climb a closed stepladder. Make sure that both sides of the stepladder are locked into place. Ask someone to hold it for you, if possible. Clearly mark and make sure that you can see: Any grab bars or  handrails. First and last steps. Where the edge of each step is. Use tools that help you move around (mobility aids) if they are needed. These include: Canes. Walkers. Scooters. Crutches. Turn on the lights when you go into a dark area. Replace any light bulbs as soon as they burn out. Set up your furniture so you have a clear path. Avoid moving your furniture around. If any of your floors are uneven, fix them. If there are any pets around you, be aware of where they are. Review your medicines with your doctor. Some medicines can make you feel dizzy. This can increase your chance of falling. Ask your doctor what other things that you can do to help prevent falls. This information is not intended to replace advice given to you by your health care provider. Make sure you discuss any questions you have with your health care provider. Document Released: 02/28/2009 Document Revised: 10/10/2015 Document Reviewed: 06/08/2014 Elsevier Interactive Patient Education  2017 [employer].

## 2023-01-21 NOTE — Progress Notes (Signed)
Subjective:   Manuel Landry is a 77 y.o. male who presents for Medicare Annual/Subsequent preventive examination.  Visit Complete: Virtual  I connected with  Dionne Bucy on 01/21/23 by a audio enabled telemedicine application and verified that I am speaking with the correct person using two identifiers.  Patient Location: Home  Provider Location: Office/Clinic  I discussed the limitations of evaluation and management by telemedicine. The patient expressed understanding and agreed to proceed.  Patient Medicare AWV questionnaire was completed by the patient on 01/20/23; I have confirmed that all information answered by patient is correct and no changes since this date.  Review of Systems     Cardiac Risk Factors include: male gender;advanced age (>65men, >32 women);dyslipidemia     Objective:   Vital Signs: Vital signs are patient reported.  Today's Vitals   01/21/23 1105  Weight: 178 lb (80.7 kg)  Height: 5\' 11"  (1.803 m)   Body mass index is 24.83 kg/m.     01/21/2023   10:46 AM 01/14/2022   10:26 AM 10/19/2021   10:36 AM 01/08/2021    9:47 AM 12/11/2020   11:12 PM 05/09/2019   10:24 AM 03/30/2019    9:38 AM  Advanced Directives  Does Patient Have a Medical Advance Directive? Yes Yes Yes Yes No Yes Yes  Type of Estate agent of [place];Living will Healthcare Power of [place];Living will  Healthcare Power of [place];Living will  Healthcare Power of [place];Living will Healthcare Power of [place];Living will  Does patient want to make changes to medical advance directive? No - Patient declined No - Patient declined No - Patient declined   No - Patient declined No - Patient declined  Copy of Healthcare Power of Attorney in Chart? Yes - validated most recent copy scanned in chart (See row information)   Yes - validated most recent copy scanned in chart (See row information)  Yes - validated most recent copy scanned in chart (See row information)  Yes - validated most recent copy scanned in chart (See row information)    Current Medications (verified) Outpatient Encounter Medications as of 01/21/2023  Medication Sig   azelastine (ASTELIN) 0.1 % nasal spray Place 1 spray into both nostrils 2 (two) times daily. Use in each nostril as directed   cetirizine-pseudoephedrine (ZYRTEC-D) 5-120 MG tablet Take 1 tablet by mouth as needed for allergies.   Docusate Sodium (COLACE PO) Take 1 capsule by mouth as needed.   fluticasone (FLONASE) 50 MCG/ACT nasal spray Place 2 sprays into both nostrils daily.   MELATONIN PO Take 1 tablet by mouth as needed.   Misc Natural Products (NEURIVA PO) Take 1 capsule by mouth daily.   Multiple Vitamins-Minerals (OCUVITE ADULT 50+) CAPS Take 1 capsule by mouth daily.   Phenylephrine HCl (SUDAFED PE SINUS CONGESTION PO) Take 1 tablet by mouth as needed.   sertraline (ZOLOFT) 50 MG tablet TAKE 1 TABLET BY MOUTH EVERY DAY   Simethicone 500 MG CAPS Take 1 capsule by mouth as needed.   tadalafil (CIALIS) 5 MG tablet Take 1 tablet (5 mg total) by mouth daily.   vitamin B-12 (CYANOCOBALAMIN) 1000 MCG tablet Take 1,000 mcg by mouth daily.   [DISCONTINUED] cetirizine (ZYRTEC) 10 MG chewable tablet Chew 10 mg by mouth as needed. PRN   [DISCONTINUED] montelukast (SINGULAIR) 10 MG tablet TAKE 1 TABLET BY MOUTH EVERYDAY AT BEDTIME   [DISCONTINUED] Omega-3 1000 MG CAPS Take 1 capsule by mouth daily.   [DISCONTINUED] timolol (TIMOPTIC) 0.5 % ophthalmic  solution Place 1 drop into the right eye 2 (two) times daily.   [DISCONTINUED] vitamin E 1000 UNIT capsule Take 1,000 Units by mouth daily.   No facility-administered encounter medications on file as of 01/21/2023.    Allergies (verified) Gabapentin   History: Past Medical History:  Diagnosis Date   Allergy    Anxiety    Arthritis    Blind loop syndrome    Bulging discs    LOWER BACK   Cataract    "beginnings of"   Diverticulosis    Family history of malignant  neoplasm of gastrointestinal tract    History of adenomatous polyp of colon    History of Barrett's esophagus    History of chronic gastritis    Hyperlipidemia    no meds taken   IBS (irritable bowel syndrome)    Internal hemorrhoids    Malignant neoplasm of prostate [facility]) urologist-  dr dahlstedt/  oncologist-  dr Kathrynn Running   dx 08-09-2017-- Stage T2a, Gleason 4+5, PSA 3.22 (on finateride)--- treatment ADT and external beam radiation   PONV (postoperative nausea and vomiting)    Seasonal allergic rhinitis    Tubular adenoma of colon    Wears glasses    Past Surgical History:  Procedure Laterality Date   COLONOSCOPY  last one 10-21-2016  dr Rhea Belton   CYSTOSCOPY WITH INSERTION OF UROLIFT N/A 12/15/2017   Procedure: CYSTOSCOPY WITH INSERTION OF UROLIFT, BIOPSY  PROSTATIC URETHAL;  Surgeon: Marcine Matar, MD;  Location: [facility];  Service: Urology;  Laterality: N/A;   ESOPHAGOGASTRODUODENOSCOPY (EGD) WITH PROPOFOL  last one 09-26-2013   dr pyrtle   INGUINAL HERNIA REPAIR Bilateral right 10-25-1997:  left 09-28-2008 (dr m. Daphine Deutscher @ [facility])   LAPAROSCOPIC CHOLECYSTECTOMY  2012   MANDIBLE SURGERY Bilateral 1990s   removal calcified bone growths   ROTATOR CUFF REPAIR Right 03-26-2009   dr Shelle Iron  [facility]   SHOULDER OPEN ROTATOR CUFF REPAIR  03/17/2012   Procedure: ROTATOR CUFF REPAIR SHOULDER OPEN;  Surgeon: Javier Docker, MD;  Location: [facility];  Service: Orthopedics;  Laterality: Left;  LEFT MINI OPEN ROTATOR CUFF REPAIR    SPACE OAR INSTILLATION N/A 12/15/2017   Procedure: SPACE OAR INSTILLATION;  Surgeon: Marcine Matar, MD;  Location: [facility];  Service: Urology;  Laterality: N/A;   TONSILLECTOMY  child   UPPER GASTROINTESTINAL ENDOSCOPY     Family History  Problem Relation Age of Onset   Colon cancer Mother 22   Cancer Mother    Diabetes Maternal Grandmother    COPD Sister    Heart failure Father 75   Esophageal cancer Neg Hx    Rectal cancer  Neg Hx    Stomach cancer Neg Hx    Social History   Socioeconomic History   Marital status: Married    Spouse name: Aram Beecham   Number of children: 0   Years of education: Not on file   Highest education level: Not on file  Occupational History   Occupation: Retired    Associate Professor: RETIRED  Tobacco Use   Smoking status: Never   Smokeless tobacco: Never   Tobacco comments:    Never smoke 11/13/21  Vaping Use   Vaping status: Never Used  Substance and Sexual Activity   Alcohol use: Yes    Alcohol/week: 2.0 standard drinks of alcohol    Types: 2 Glasses of wine per week    Comment: occasional   Drug use: Never   Sexual activity: Not Currently  Birth control/protection: None    Comment: vasectomy 2011  Other Topics Concern   Not on file  Social History Narrative   Not on file   Social Determinants of Health   Financial Resource Strain: Low Risk  (01/20/2023)   Overall Financial Resource Strain (CARDIA)    Difficulty of Paying Living Expenses: Not hard at all  Food Insecurity: No Food Insecurity (01/20/2023)   Hunger Vital Sign    Worried About Running Out of Food in the Last Year: Never true    Ran Out of Food in the Last Year: Never true  Transportation Needs: No Transportation Needs (01/20/2023)   PRAPARE - Administrator, Civil Service (Medical): No    Lack of Transportation (Non-Medical): No  Physical Activity: Sufficiently Active (01/20/2023)   Exercise Vital Sign    Days of Exercise per Week: 7 days    Minutes of Exercise per Session: 50 min  Stress: No Stress Concern Present (01/20/2023)   [employer] of Occupational Health - Occupational Stress Questionnaire    Feeling of Stress : Not at all  Social Connections: Unknown (01/20/2023)   Social Connection and Isolation Panel [NHANES]    Frequency of Communication with Friends and Family: More than three times a week    Frequency of Social Gatherings with Friends and Family: Once a week    Attends  Religious Services: Not on Marketing executive or Organizations: Yes    Attends Banker Meetings: More than 4 times per year    Marital Status: Married    Tobacco Counseling Counseling given: Not Answered Tobacco comments: Never smoke 11/13/21   Clinical Intake:  Pre-visit preparation completed: Yes  Pain : No/denies pain  BMI - recorded: 24.83 Nutritional Status: BMI of 19-24  Normal Nutritional Risks: None Diabetes: No  How often do you need to have someone help you when you read instructions, pamphlets, or other written materials from your doctor or pharmacy?: 1 - Never  Interpreter Needed?: No  Information entered by :: Donne Anon, CMA   Activities of Daily Living    01/20/2023    6:50 PM  In your present state of health, do you have any difficulty performing the following activities:  Hearing? 1  Comment wears hearing aids  Vision? 0  Difficulty concentrating or making decisions? 0  Walking or climbing stairs? 0  Dressing or bathing? 0  Doing errands, shopping? 0  Preparing Food and eating ? N  Using the Toilet? N  In the past six months, have you accidently leaked urine? N  Do you have problems with loss of bowel control? N  Managing your Medications? N  Managing your Finances? N  Housekeeping or managing your Housekeeping? N    Patient Care Team: Sharlene Dory, DO as PCP - General (Family Medicine) Kathleene Hazel, MD as PCP - Cardiology (Cardiology) Marcine Matar, MD as Consulting Physician (Urology) Tedd Sias, MD as Consulting Physician (Optometry) Arminda Resides, MD as Consulting Physician (Dermatology) Kathleene Hazel, MD as Consulting Physician (Cardiology) Pyrtle, Carie Caddy, MD as Consulting Physician (Gastroenterology) Charlsie Merles Kirstie Peri, DPM as Consulting Physician (Podiatry)  Indicate any recent Medical Services you may have received from other than [facility] providers in the past year (date may  be approximate).     Assessment:   This is a routine wellness examination for [place].  Hearing/Vision screen No results found.   Goals Addressed   None    Depression Screen  01/21/2023   11:04 AM 07/10/2022    9:15 AM 01/14/2022   10:38 AM 01/08/2021    9:50 AM 09/02/2020   10:23 AM 05/09/2019   10:30 AM 09/07/2017    2:55 PM  PHQ 2/9 Scores  PHQ - 2 Score 0 0 0 0 0 0 0  PHQ- 9 Score  0         Fall Risk    01/20/2023    6:50 PM 07/10/2022    9:14 AM 01/14/2022   10:26 AM 01/08/2021    9:49 AM 05/09/2019   10:30 AM  Fall Risk   Falls in the past year? 0 0 1 1 0  Number falls in past yr: 0 0 0 1   Injury with Fall? 0 0 0 0   Risk for fall due to : No Fall Risks No Fall Risks No Fall Risks History of fall(s)   Follow up Falls evaluation completed Falls evaluation completed Falls evaluation completed Falls prevention discussed Education provided;Falls prevention discussed    MEDICARE RISK AT HOME: Medicare Risk at Home Any stairs in or around the home?: No If so, are there any without handrails?: No Home free of loose throw rugs in walkways, pet beds, electrical cords, etc?: Yes Adequate lighting in your home to reduce risk of falls?: Yes Life alert?: Yes Use of a cane, walker or w/c?: No Grab bars in the bathroom?: Yes Shower chair or bench in shower?: Yes Elevated toilet seat or a handicapped toilet?: Yes  TIMED UP AND GO:  Was the test performed?  No    Cognitive Function:        01/21/2023   11:06 AM 01/14/2022   10:39 AM  6CIT Screen  What Year? 0 points 0 points  What month? 0 points 0 points  What time? 0 points 0 points  Count back from 20 0 points 0 points  Months in reverse 0 points 4 points  Repeat phrase 0 points 0 points  Total Score 0 points 4 points    Immunizations Immunization History  Administered Date(s) Administered   Fluad Quad(high Dose 65+) 03/04/2021   Influenza, High Dose Seasonal PF 03/01/2012, 04/20/2013, 02/16/2014,  03/08/2015, 02/25/2016, 01/27/2017, 03/07/2018, 02/21/2019, 02/13/2020, 03/04/2021, 02/10/2022   Influenza-Unspecified 03/01/2009, 03/11/2010, 03/10/2011   Moderna Sars-Covid-2 Vaccination 05/16/2019, 06/13/2019   Pneumococcal Conjugate-13 08/15/2013   Pneumococcal Polysaccharide-23 08/05/2011   Td 08/05/2003   Tdap 09/07/2011, 02/23/2019   Tetanus 10/27/2010   Zoster Recombinant(Shingrix) 01/23/2021, 10/16/2021   Zoster, Live 07/14/2010    TDAP status: Up to date  Flu Vaccine status: Due, Education has been provided regarding the importance of this vaccine. Advised may receive this vaccine at local pharmacy or Health Dept. Aware to provide a copy of the vaccination record if obtained from local pharmacy or Health Dept. Verbalized acceptance and understanding.  Pneumococcal vaccine status: Up to date  Covid-19 vaccine status: Information provided on how to obtain vaccines.   Qualifies for Shingles Vaccine? Yes   Zostavax completed Yes   Shingrix Completed?: Yes  Screening Tests Health Maintenance  Topic Date Due   Medicare Annual Wellness (AWV)  01/15/2023   COVID-19 Vaccine (3 - 2023-24 season) 01/17/2023   INFLUENZA VACCINE  08/16/2023 (Originally 12/17/2022)   Colonoscopy  01/08/2025   DTaP/Tdap/Td (5 - Td or Tdap) 02/22/2029   Pneumonia Vaccine 61+ Years old  Completed   Hepatitis C Screening  Completed   Zoster Vaccines- Shingrix  Completed   HPV VACCINES  Aged Out  Health Maintenance  Health Maintenance Due  Topic Date Due   Medicare Annual Wellness (AWV)  01/15/2023   COVID-19 Vaccine (3 - 2023-24 season) 01/17/2023    Colorectal cancer screening: Type of screening: Colonoscopy. Completed 01/09/20. Repeat every 5 years  Lung Cancer Screening: (Low Dose CT Chest recommended if Age 64-80 years, 20 pack-year currently smoking OR have quit w/in 15years.) does not qualify.   Additional Screening:  Hepatitis C Screening: does qualify; Completed 06/27/18  Vision  Screening: Recommended annual ophthalmology exams for early detection of glaucoma and other disorders of the eye. Is the patient up to date with their annual eye exam?  Yes  Who is the provider or what is the name of the office in which the patient attends annual eye exams? Dr. Sherrine Maples [facility]. If pt is not established with a provider, would they like to be referred to a provider to establish care? No .   Dental Screening: Recommended annual dental exams for proper oral hygiene  Diabetic Foot Exam: N/a  Community Resource Referral / Chronic Care Management: CRR required this visit?  No   CCM required this visit?  No     Plan:     I have personally reviewed and noted the following in the patient's chart:   Medical and social history Use of alcohol, tobacco or illicit drugs  Current medications and supplements including opioid prescriptions. Patient is not currently taking opioid prescriptions. Functional ability and status Nutritional status Physical activity Advanced directives List of other physicians Hospitalizations, surgeries, and ER visits in previous 12 months Vitals Screenings to include cognitive, depression, and falls Referrals and appointments  In addition, I have reviewed and discussed with patient certain preventive protocols, quality metrics, and best practice recommendations. A written personalized care plan for preventive services as well as general preventive health recommendations were provided to patient.     Donne Anon, CMA   01/21/2023   After Visit Summary: (MyChart) Due to this being a telephonic visit, the after visit summary with patients personalized plan was offered to patient via MyChart   Nurse Notes: None

## 2023-02-05 ENCOUNTER — Ambulatory Visit (INDEPENDENT_AMBULATORY_CARE_PROVIDER_SITE_OTHER): Payer: Medicare Other | Admitting: Family Medicine

## 2023-02-05 ENCOUNTER — Other Ambulatory Visit (HOSPITAL_BASED_OUTPATIENT_CLINIC_OR_DEPARTMENT_OTHER): Payer: Self-pay

## 2023-02-05 VITALS — BP 148/90 | HR 95 | Temp 98.8°F | Ht 71.0 in | Wt 182.0 lb

## 2023-02-05 DIAGNOSIS — M545 Low back pain, unspecified: Secondary | ICD-10-CM | POA: Diagnosis not present

## 2023-02-05 MED ORDER — IBUPROFEN 800 MG PO TABS
800.0000 mg | ORAL_TABLET | Freq: Three times a day (TID) | ORAL | 0 refills | Status: DC | PRN
  Filled 2023-02-05: qty 30, 10d supply, fill #0

## 2023-02-05 MED ORDER — TIZANIDINE HCL 4 MG PO TABS
4.0000 mg | ORAL_TABLET | Freq: Four times a day (QID) | ORAL | 0 refills | Status: DC | PRN
  Filled 2023-02-05: qty 21, 6d supply, fill #0

## 2023-02-05 NOTE — Progress Notes (Signed)
Musculoskeletal Exam  Patient: Manuel Landry DOB: 26-Feb-1946  DOS: 02/05/2023  SUBJECTIVE:  Chief Complaint:   Chief Complaint  Patient presents with   Back Pain    Manuel Landry is a 77 y.o.  male for evaluation and treatment of back pain.   Onset:  2 weeks ago.  Started after he was working outside.  Location: lower SI jt on L Character: like a deep cramp Progression of issue:  has worsened Associated symptoms: radiating around to L hip Denies bruising, redness, swelling, bowel/bladder incontinence or weakness Treatment: to date has been rest, ice, and OTC NSAIDS.   Neurovascular symptoms: no  Past Medical History:  Diagnosis Date   Allergy    Anxiety    Arthritis    Blind loop syndrome    Bulging discs    LOWER BACK   Cataract    "beginnings of"   Diverticulosis    Family history of malignant neoplasm of gastrointestinal tract    History of adenomatous polyp of colon    History of Barrett's esophagus    History of chronic gastritis    Hyperlipidemia    no meds taken   IBS (irritable bowel syndrome)    Internal hemorrhoids    Malignant neoplasm of prostate [facility]) urologist-  dr dahlstedt/  oncologist-  dr Kathrynn Running   dx 08-09-2017-- Stage T2a, Gleason 4+5, PSA 3.22 (on finateride)--- treatment ADT and external beam radiation   PONV (postoperative nausea and vomiting)    Seasonal allergic rhinitis    Tubular adenoma of colon    Wears glasses     Objective:  VITAL SIGNS: BP (!) 148/90 (Cuff Size: Normal)   Pulse 95   Temp 98.8 F (37.1 C) (Oral)   Ht 5\' 11"  (1.803 m)   Wt 182 lb (82.6 kg)   SpO2 98%   BMI 25.38 kg/m  Constitutional: Well formed, well developed. No acute distress. HENT: Normocephalic, atraumatic.  Thorax & Lungs:  No accessory muscle use Musculoskeletal: low back.   Tenderness to palpation: mild over distal glute med Deformity: no Ecchymosis: no Straight leg test: negative for Poor hamstring flexibility b/l. Neurologic:  Normal sensory function. No focal deficits noted. DTR's equal and symmetric in LE's. No clonus. Psychiatric: Normal mood. Age appropriate judgment and insight. Alert & oriented x 3.    Assessment:  Acute left-sided low back pain without sciatica - Plan: ibuprofen (ADVIL) 800 MG tablet, tiZANidine (ZANAFLEX) 4 MG tablet  Plan: Stretches/exercises, heat, ice, Tylenol, NSAIDs. Warnings about Zanaflex. Send message if no better.  F/u prn. The patient voiced understanding and agreement to the plan.   Jilda Roche [place], DO 02/05/23  2:43 PM

## 2023-02-05 NOTE — Patient Instructions (Addendum)
Heat (pad or rice pillow in microwave) over affected area, 10-15 minutes twice daily.   Ice/cold pack over area for 10-15 min twice daily.  OK to take Tylenol 1000 mg (2 extra strength tabs) or 975 mg (3 regular strength tabs) every 6 hours as needed.  Let us know if you need anything.  Gluteus Medius Syndrome Rehab It is normal to feel mild stretching, pulling, tightness, or discomfort as you do these exercises, but you should stop right away if you feel sudden pain or your pain gets worse.   Stretching and range of motion exercise This exercise warms up your muscles and joints and improves the movement and flexibility of your hip and pelvis. This exercise also helps to relieve pain and stiffness. Exercise A: Lunge (hip flexor stretch)      Kneel on the floor on your left / right knee. Bend your other knee so it is directly over your ankle. Keep good posture with your head over your shoulders. Tuck your tailbone underneath you. This will prevent your back from arching too much. You should feel a gentle stretch in the front of your thigh or hip. If you do not feel a stretch, slowly lunge forward with your chest up. Hold this position for 30 seconds. Slowly return to the starting position. Repeat 2 times. Complete this exercise 3 times per week. Strengthening exercises These exercises build strength and endurance in your hip and pelvis. Endurance is the ability to use your muscles for a long time, even after they get tired. Exercise B: Bridge (hip extensors)     Lie on your back on a firm surface with your knees bent and your feet flat on the floor. Tighten your buttocks muscles and lift your bottom off the floor until the trunk of your body is level with your thighs. You should feel the muscles working in your buttocks and the back of your thighs. If this exercise is too easy, cross your arms over your chest or lift one leg while your bottom is up off the floor. Do not arch your  back. Hold this position for 3 seconds. Slowly lower your hips to the starting position. Let your muscles relax completely between repetitions. Repeat 2 times. Complete this exercise 3 times per week. Exercise C: Straight leg raises (hip abductors)     Lie on your side with your left / right leg in the top position. Lie so your head, shoulder, knee, and hip line up. Bend your bottom knee to help you balance. Lift your top leg up 4-6 inches (10-15 cm), keeping your toes pointed straight ahead. Hold this position for 2 seconds. Slowly lower your leg to the starting position and let your muscles relax completely. Repeat for a total of 10 repetitions. Repeat 2 times. Complete this exercise 3 times per week. Exercise D: Hip abductors and external rotators, quadruped Get on your hands and knees on a firm, lightly padded surface. Your hands should be directly below your shoulders, and your knees should be directly below your hips. Lift your left / right knee out to the side. Keep your knee bent. Do not twist your body. Hold this position for 3 seconds. Slowly lower your leg. Repeat for a total of 10 repetitions.  Repeat 2 times. Complete this exercise 3 times per week. Exercise E: Single leg stand Stand near a counter or door frame to hold onto as needed. It is helpful to look in a mirror for this exercise so you can  watch your hip. Squeeze your left / right buttock muscles then lift up your other foot. Do not let your left / right hip push out to the side. Hold this position for 3 seconds. Repeat for a total of 10 repetitions. Repeat 2 times. Complete this exercise 3 times per week. Make sure you discuss any questions you have with your health care provider. Document Released: 05/04/2005 Document Revised: 01/09/2016 Document Reviewed: 04/16/2015 Elsevier Interactive Patient Education  [employer].

## 2023-02-18 DIAGNOSIS — Z23 Encounter for immunization: Secondary | ICD-10-CM | POA: Diagnosis not present

## 2023-02-24 ENCOUNTER — Other Ambulatory Visit (HOSPITAL_BASED_OUTPATIENT_CLINIC_OR_DEPARTMENT_OTHER): Payer: Self-pay

## 2023-02-24 ENCOUNTER — Other Ambulatory Visit: Payer: Self-pay | Admitting: Family Medicine

## 2023-02-24 DIAGNOSIS — M545 Low back pain, unspecified: Secondary | ICD-10-CM

## 2023-02-24 MED ORDER — TIZANIDINE HCL 4 MG PO TABS
4.0000 mg | ORAL_TABLET | Freq: Four times a day (QID) | ORAL | 0 refills | Status: DC | PRN
  Filled 2023-02-24: qty 21, 6d supply, fill #0

## 2023-02-25 ENCOUNTER — Other Ambulatory Visit: Payer: Self-pay | Admitting: Family Medicine

## 2023-02-25 ENCOUNTER — Other Ambulatory Visit (HOSPITAL_BASED_OUTPATIENT_CLINIC_OR_DEPARTMENT_OTHER): Payer: Self-pay

## 2023-02-25 DIAGNOSIS — M545 Low back pain, unspecified: Secondary | ICD-10-CM

## 2023-02-25 MED ORDER — IBUPROFEN 800 MG PO TABS
800.0000 mg | ORAL_TABLET | Freq: Three times a day (TID) | ORAL | 0 refills | Status: DC | PRN
  Filled 2023-02-25: qty 30, 10d supply, fill #0

## 2023-03-02 ENCOUNTER — Ambulatory Visit (INDEPENDENT_AMBULATORY_CARE_PROVIDER_SITE_OTHER): Payer: Medicare Other | Admitting: Family Medicine

## 2023-03-02 ENCOUNTER — Encounter: Payer: Self-pay | Admitting: Family Medicine

## 2023-03-02 VITALS — BP 140/86 | HR 66 | Temp 97.8°F | Resp 16 | Ht 71.0 in | Wt 178.0 lb

## 2023-03-02 DIAGNOSIS — M545 Low back pain, unspecified: Secondary | ICD-10-CM

## 2023-03-02 DIAGNOSIS — C61 Malignant neoplasm of prostate: Secondary | ICD-10-CM | POA: Diagnosis not present

## 2023-03-02 NOTE — Patient Instructions (Signed)
Heat (pad or rice pillow in microwave) over affected area, 10-15 minutes twice daily.   Ice/cold pack over area for 10-15 min twice daily.  If you do not hear anything about your referral in the next 1-2 weeks, call our office and ask for an update.  Let us know if you need anything.

## 2023-03-02 NOTE — Progress Notes (Signed)
Musculoskeletal Exam  Patient: Manuel Landry DOB: [birth year]  DOS: 03/02/2023  SUBJECTIVE:  Chief Complaint:   Chief Complaint  Patient presents with   Back Pain    Lower back pain    Manuel Landry is a 77 y.o.  male for f/u low back pain.   Onset:  6 weeks ago. Started after working outside initially.  Location: lower Character:   like a deep cramp   Progression of issue:  is unchanged Associated symptoms: radiating to L hip Denies bruising, redness, swelling, bowel/bladder incontinence or weakness Treatment: to date has been rest, ice, OTC NSAIDS, and he was compliant stretches/exercises provided by me.   Neurovascular symptoms: no  Past Medical History:  Diagnosis Date   Allergy    Anxiety    Arthritis    Blind loop syndrome    Bulging discs    LOWER BACK   Cataract    "beginnings of"   Diverticulosis    Family history of malignant neoplasm of gastrointestinal tract    History of adenomatous polyp of colon    History of Barrett's esophagus    History of chronic gastritis    Hyperlipidemia    no meds taken   IBS (irritable bowel syndrome)    Internal hemorrhoids    Malignant neoplasm of prostate [facility]) urologist-  dr dahlstedt/  oncologist-  dr Kathrynn Running   dx 08-09-2017-- Stage T2a, Gleason 4+5, PSA 3.22 (on finateride)--- treatment ADT and external beam radiation   PONV (postoperative nausea and vomiting)    Seasonal allergic rhinitis    Tubular adenoma of colon    Wears glasses     Objective:  VITAL SIGNS: BP (!) 140/86 (BP Location: Left Arm, Patient Position: Sitting, Cuff Size: Normal)   Pulse 66   Temp 97.8 F (36.6 C) (Oral)   Resp 16   Ht 5\' 11"  (1.803 m)   Wt 178 lb (80.7 kg)   SpO2 95%   BMI 24.83 kg/m  Constitutional: Well formed, well developed. No acute distress. HENT: Normocephalic, atraumatic.  Thorax & Lungs:  No accessory muscle use Musculoskeletal: low back.   Tenderness to palpation: yes over L SI jt Deformity:  no Ecchymosis: no Straight leg test: negative for Poor hamstring flexibility b/l. Neurologic: Normal sensory function. No focal deficits noted. DTR's equal and symmetric in LE's. No clonus. Gait is nml. 5/5 strength in LE's b/l. Psychiatric: Normal mood. Age appropriate judgment and insight. Alert & oriented x 3.    Assessment:  Left-sided low back pain without sciatica, unspecified chronicity - Plan: Ambulatory referral to Physical Therapy  Plan: Stretches/exercises to continue, heat, ice, Tylenol, NSAIDs. Refer to PT.  Offered SI jt injection, will hold off for now.  F/u prn. The patient voiced understanding and agreement to the plan.   Jilda Roche [place], DO 03/02/23  2:18 PM

## 2023-03-04 ENCOUNTER — Ambulatory Visit: Payer: Medicare Other | Attending: Family Medicine

## 2023-03-04 ENCOUNTER — Other Ambulatory Visit: Payer: Self-pay

## 2023-03-04 DIAGNOSIS — M5386 Other specified dorsopathies, lumbar region: Secondary | ICD-10-CM | POA: Diagnosis not present

## 2023-03-04 DIAGNOSIS — R262 Difficulty in walking, not elsewhere classified: Secondary | ICD-10-CM | POA: Diagnosis not present

## 2023-03-04 DIAGNOSIS — M545 Low back pain, unspecified: Secondary | ICD-10-CM | POA: Insufficient documentation

## 2023-03-04 DIAGNOSIS — M5432 Sciatica, left side: Secondary | ICD-10-CM | POA: Insufficient documentation

## 2023-03-04 DIAGNOSIS — M25652 Stiffness of left hip, not elsewhere classified: Secondary | ICD-10-CM | POA: Insufficient documentation

## 2023-03-04 NOTE — Therapy (Signed)
OUTPATIENT PHYSICAL THERAPY THORACOLUMBAR EVALUATION   Patient Name: Manuel Landry MRN: [id number] DOB:02/12/1946, 77 y.o., male Today's Date: 03/04/2023  END OF SESSION:  PT End of Session - 03/04/23 1118     Visit Number 1    Date for PT Re-Evaluation 04/29/23    Progress Note Due on Visit 10    PT Start Time 1023    PT Stop Time 1110    PT Time Calculation (min) 47 min    Activity Tolerance Patient tolerated treatment well    Behavior During Therapy WFL for tasks assessed/performed             Past Medical History:  Diagnosis Date   Allergy    Anxiety    Arthritis    Blind loop syndrome    Bulging discs    LOWER BACK   Cataract    "beginnings of"   Diverticulosis    Family history of malignant neoplasm of gastrointestinal tract    History of adenomatous polyp of colon    History of Barrett's esophagus    History of chronic gastritis    Hyperlipidemia    no meds taken   IBS (irritable bowel syndrome)    Internal hemorrhoids    Malignant neoplasm of prostate [facility]) urologist-  dr dahlstedt/  oncologist-  dr Kathrynn Running   dx 08-09-2017-- Stage T2a, Gleason 4+5, PSA 3.22 (on finateride)--- treatment ADT and external beam radiation   PONV (postoperative nausea and vomiting)    Seasonal allergic rhinitis    Tubular adenoma of colon    Wears glasses    Past Surgical History:  Procedure Laterality Date   COLONOSCOPY  last one 10-21-2016  dr Rhea Belton   CYSTOSCOPY WITH INSERTION OF UROLIFT N/A 12/15/2017   Procedure: CYSTOSCOPY WITH INSERTION OF UROLIFT, BIOPSY  PROSTATIC URETHAL;  Surgeon: Marcine Matar, MD;  Location: [facility];  Service: Urology;  Laterality: N/A;   ESOPHAGOGASTRODUODENOSCOPY (EGD) WITH PROPOFOL  last one 09-26-2013   dr pyrtle   INGUINAL HERNIA REPAIR Bilateral right 10-25-1997:  left 09-28-2008 (dr m. Daphine Deutscher @ [facility])   LAPAROSCOPIC CHOLECYSTECTOMY  2012   MANDIBLE SURGERY Bilateral 1990s   removal calcified bone growths    ROTATOR CUFF REPAIR Right 03-26-2009   dr Shelle Iron  [facility]   SHOULDER OPEN ROTATOR CUFF REPAIR  03/17/2012   Procedure: ROTATOR CUFF REPAIR SHOULDER OPEN;  Surgeon: Javier Docker, MD;  Location: [facility];  Service: Orthopedics;  Laterality: Left;  LEFT MINI OPEN ROTATOR CUFF REPAIR    SPACE OAR INSTILLATION N/A 12/15/2017   Procedure: SPACE OAR INSTILLATION;  Surgeon: Marcine Matar, MD;  Location: [facility];  Service: Urology;  Laterality: N/A;   TONSILLECTOMY  child   UPPER GASTROINTESTINAL ENDOSCOPY     Patient Active Problem List   Diagnosis Date Noted   Elevated blood pressure reading 06/11/2022   Seasonal allergies 03/03/2022   Chronic rhinitis 03/06/2021   Nocturnal muscle cramp 09/02/2020   GAD (generalized anxiety disorder) 09/02/2020   Closed nondisplaced fracture of body of left scapula 04/15/2020   Situational anxiety 12/27/2018   Trigger finger of left thumb 11/17/2018   Hyperlipidemia 01/27/2017   Cardiovascular risk factor 08/12/2011   S/P cholecystectomy 06/22/2011   History of colonic polyps 06/22/2011   FLATULENCE-GAS-BLOATING 02/13/2010   Calculus of gallbladder 02/11/2010   GERD 02/10/2010   Diverticulosis of colon 02/10/2010   Nausea 02/10/2010   ABDOMINAL PAIN RIGHT UPPER QUADRANT 02/10/2010   ABDOMINAL PAIN, EPIGASTRIC 02/10/2010  ANEMIA-IRON DEFICIENCY 07/17/2008   Inguinal hernia 07/17/2008   IRRITABLE BOWEL SYNDROME 07/17/2008   Gastritis and gastroduodenitis 07/11/2008   Duodenitis 07/11/2008   RENAL CYST, RIGHT 07/11/2008   DYSPEPSIA 06/12/2008   Constipation 05/31/2008   Backache 05/31/2008   BENIGN PROSTATIC HYPERTROPHY, MILD, HX OF 05/31/2008    PCP: Arva Chafe , DO  REFERRING PROVIDER: same  REFERRING DIAG: L sciatic pain  Rationale for Evaluation and Treatment: Rehabilitation  THERAPY DIAG:  Sciatica, left side - Plan: PT PLAN OF CARE CERT/RE-CERT  Difficulty in walking, not elsewhere classified - Plan: PT  PLAN OF CARE CERT/RE-CERT  Stiffness of left hip, not elsewhere classified - Plan: PT PLAN OF CARE CERT/RE-CERT  Decreased ROM of lumbar spine - Plan: PT PLAN OF CARE CERT/RE-CERT  ONSET DATE: 3 weeks, Sept 28 2024  SUBJECTIVE:                                                                                                                                                                                           SUBJECTIVE STATEMENT: 3 week h/o L sided SI jt pain after moving /lifting boxes in storage unit, was "fatigued"  , but then woke up with severe L SI jt line pain.  Woke up this deep cramp from ant L knee to groin L TFL, and SI jt line took a methocarbinol last night that helped some.MD has recommended possible injection L SI jt but I dont want an injection as they have not helped in the past   PERTINENT HISTORY:  Patient has gone to his physician 2 x , taking ibuprofen and muscle relaxers without much relief.  Referred to PT for eval and treat  PAIN:  Are you having pain? Yes: NPRS scale: 0 to 6/10 Pain location: L ant thigh into L lat hip and L SI jt Pain description: cramp that wont let up Aggravating factors: trying to walk, walking dog  Relieving factors: ice, TENS, med, minimal relief  PRECAUTIONS: None  RED FLAGS: None   WEIGHT BEARING RESTRICTIONS: No  FALLS:  Has patient fallen in last 6 months? No  LIVING ENVIRONMENT: Lives with: lives with their spouse Lives in: House/apartment Stairs: No Has following equipment at home: Single point cane  OCCUPATION: retired  PLOF: Independent  PATIENT GOALS: get rid of pain, be able to walk dog  NEXT MD VISIT: unclear  OBJECTIVE:  Note: Objective measures were completed at Evaluation unless otherwise noted.  DIAGNOSTIC FINDINGS:  none  PATIENT SURVEYS:  Modified Oswestry 20/50   SCREENING FOR RED FLAGS: Bowel or bladder incontinence: No Spinal tumors: No Cauda equina syndrome: No Compression fracture:  No Abdominal aneurysm: No  COGNITION: Overall cognitive status: Within functional limits for tasks assessed     SENSATION: Reports radicular pain L thigh, no numbness, neural Sx  MUSCLE LENGTH: Hamstrings: Right wfl deg; Left wfl deg [name] test: Right -5 deg; Left -15 deg  POSTURE: flexed trunk  and loss of lumbar lordosis and loss of gluteal mass B  PALPATION: Pt tender proximal L quads, L TFL prox attachment L glut medius and L glut maximus Pain with PA segmental testing into L SI jt at L4/5 and L5/S1  LUMBAR ROM:   AROM eval  Flexion 70  Extension 50 P! L SI  Right lateral flexion 50  Left lateral flexion 50 p! L SI  Right rotation   Left rotation    (Blank rows = not tested)  LOWER EXTREMITY ROM:     Passive  Right eval Left eval  Hip flexion 125 110  Hip extension -5 -15  Hip abduction    Hip adduction    Hip internal rotation 12 3  Hip external rotation -20 -15  Knee flexion    Knee extension    Ankle dorsiflexion    Ankle plantarflexion    Ankle inversion    Ankle eversion     (Blank rows = wfl)  LOWER EXTREMITY MMT:    MMT Right eval Left eval  Hip flexion  P! 3-  Hip extension    Hip abduction  5  Hip adduction    Hip internal rotation    Hip external rotation  5  Knee flexion    Knee extension  4-/5 P!  Ankle dorsiflexion 4 4  Ankle plantarflexion 4 4  Ankle inversion    Ankle eversion     (Blank rows = wfl)  LUMBAR SPECIAL TESTS:  Straight leg raise test: Negative and FABER test: Negative  FUNCTIONAL TESTS:  30 sec sit to stand TBD   6 min walk test  unable today, antalgic/painful gait  GAIT: Distance walked: 70' Assistive device utilized: None Level of assistance: Complete Independence Comments: antalgic on L, decreased stance time on L Also noted L hip remains in flexed position to 15 degrees in supine  TODAY'S TREATMENT:                                                                                                                               DATE: 03/04/23: Manual:  Mob with movement for L hip flexion, 3 bouts 12 reps, utilized for assessment purposes and pt had improved pain and movement L hip so incorporated into treatment , lateral distraction of L femur with flexion.    Also R side lying with pillow between legs , cross friction massage L lateral hip musculature,  TFL, glut med  Instructed pt in seated hip abd with green t band, 15 reps    PATIENT EDUCATION:  Education details: POC, goals Person educated: Patient Education method: Explanation, Demonstration, and Tactile cues Education comprehension:  verbalized understanding, returned demonstration, and verbal cues required  HOME EXERCISE PROGRAM: Provided written directions for the theraband hip abd, advised to perform 2 x day x 15 reps   ASSESSMENT:  CLINICAL IMPRESSION: Patient is a 77 y.o. male who was evaluated today by skilled physical therapy to address sudden onset of L SI pain.  He demonstrates decreased lumbar ROM for ext and L SB, also loss of ROM and pain with all planes of L hip movement especially flex, and abd.  Also weakness L hip flexion and knee ext.  His gait pattern is antalgic and he has increased pain with prolonged walking.  Today we stretched/ mobilized his L hip jt with improved pain and movement noted. He should benefit from skilled physical therapy to address his pain and function.   OBJECTIVE IMPAIRMENTS: Abnormal gait, decreased activity tolerance, decreased endurance, difficulty walking, decreased ROM, decreased strength, hypomobility, and pain.   ACTIVITY LIMITATIONS: carrying, lifting, bending, squatting, sleeping, stairs, and locomotion level  PARTICIPATION LIMITATIONS: laundry, driving, shopping, community activity, and yard work  PERSONAL FACTORS: Age, Behavior pattern, Past/current experiences, Time since onset of injury/illness/exacerbation, and 1-2 comorbidities: h/o prostate ca, h/o rotator cuff repair  are also  affecting patient's functional outcome.   REHAB POTENTIAL: Good  CLINICAL DECISION MAKING: Stable/uncomplicated  EVALUATION COMPLEXITY: Low   GOALS: Goals reviewed with patient? Yes  SHORT TERM GOALS: Target date: 03/18/23  I HEP Baseline: Goal status: INITIAL   LONG TERM GOALS: Target date: 04/29/23  Modified oswestry decrease from 20/50 to 10/50 Baseline:  Goal status: INITIAL  2.  Improve L hip ROM for flexion to 115 and IR to 10 from 110 and 3 for impoved standing tolerance, gait mechanics Baseline:  Goal status: INITIAL  3.  Able to complete 6 min walk test without L thigh/post hip pain for improved function  Baseline: pain with all gait Goal status: INITIAL  4.  Improve L quads strength to 4+/5 for improved ability to descend hills, walk Baseline:  Goal status: INITIAL  PLAN:  PT FREQUENCY: 2x/week  PT DURATION: 8 weeks  PLANNED INTERVENTIONS: 97110-Therapeutic exercises, 97530- Therapeutic activity, [id number]- Neuromuscular re-education, 97535- Self Care, [id number]- Manual therapy, and [id number]- Gait training.  PLAN FOR NEXT SESSION: How did patient respond to the stretching initial visit, possibly TPDN L lat/post hip, L quads strengthening, L ant hip, thigh stretching.   Shaymus Eveleth L Andrika Peraza, PT 03/04/2023, 2:26 PM

## 2023-03-05 ENCOUNTER — Other Ambulatory Visit: Payer: Self-pay | Admitting: Family Medicine

## 2023-03-05 ENCOUNTER — Other Ambulatory Visit (HOSPITAL_BASED_OUTPATIENT_CLINIC_OR_DEPARTMENT_OTHER): Payer: Self-pay

## 2023-03-05 ENCOUNTER — Encounter: Payer: Self-pay | Admitting: Family Medicine

## 2023-03-05 MED ORDER — METHOCARBAMOL 500 MG PO TABS
500.0000 mg | ORAL_TABLET | Freq: Three times a day (TID) | ORAL | 0 refills | Status: DC | PRN
  Filled 2023-03-05: qty 30, 10d supply, fill #0

## 2023-03-09 ENCOUNTER — Other Ambulatory Visit (HOSPITAL_BASED_OUTPATIENT_CLINIC_OR_DEPARTMENT_OTHER): Payer: Self-pay

## 2023-03-09 DIAGNOSIS — C61 Malignant neoplasm of prostate: Secondary | ICD-10-CM | POA: Diagnosis not present

## 2023-03-09 MED ORDER — TADALAFIL 5 MG PO TABS
5.0000 mg | ORAL_TABLET | Freq: Every day | ORAL | 3 refills | Status: DC
Start: 2023-03-09 — End: 2024-03-07
  Filled 2023-03-09 – 2023-03-26 (×2): qty 90, 90d supply, fill #0
  Filled 2023-06-10: qty 90, 90d supply, fill #1
  Filled 2023-09-14: qty 90, 90d supply, fill #2
  Filled 2023-12-17 – 2023-12-19 (×2): qty 90, 90d supply, fill #3

## 2023-03-10 ENCOUNTER — Ambulatory Visit: Payer: Medicare Other

## 2023-03-10 DIAGNOSIS — R262 Difficulty in walking, not elsewhere classified: Secondary | ICD-10-CM

## 2023-03-10 DIAGNOSIS — M25652 Stiffness of left hip, not elsewhere classified: Secondary | ICD-10-CM | POA: Diagnosis not present

## 2023-03-10 DIAGNOSIS — M545 Low back pain, unspecified: Secondary | ICD-10-CM | POA: Diagnosis not present

## 2023-03-10 DIAGNOSIS — M5386 Other specified dorsopathies, lumbar region: Secondary | ICD-10-CM | POA: Diagnosis not present

## 2023-03-10 DIAGNOSIS — M5432 Sciatica, left side: Secondary | ICD-10-CM | POA: Diagnosis not present

## 2023-03-10 NOTE — Therapy (Signed)
OUTPATIENT PHYSICAL THERAPY THORACOLUMBAR TREATMENT   Patient Name: Manuel Landry MRN: [id number] DOB:Nov 22, 1945, 77 y.o., male Today's Date: 03/10/2023  END OF SESSION:  PT End of Session - 03/10/23 1443     Visit Number 2    Date for PT Re-Evaluation 04/29/23    Progress Note Due on Visit 10    PT Start Time 1357    PT Stop Time 1438    PT Time Calculation (min) 41 min    Activity Tolerance Patient tolerated treatment well    Behavior During Therapy WFL for tasks assessed/performed              Past Medical History:  Diagnosis Date   Allergy    Anxiety    Arthritis    Blind loop syndrome    Bulging discs    LOWER BACK   Cataract    "beginnings of"   Diverticulosis    Family history of malignant neoplasm of gastrointestinal tract    History of adenomatous polyp of colon    History of Barrett's esophagus    History of chronic gastritis    Hyperlipidemia    no meds taken   IBS (irritable bowel syndrome)    Internal hemorrhoids    Malignant neoplasm of prostate [facility]) urologist-  dr dahlstedt/  oncologist-  dr Kathrynn Running   dx 08-09-2017-- Stage T2a, Gleason 4+5, PSA 3.22 (on finateride)--- treatment ADT and external beam radiation   PONV (postoperative nausea and vomiting)    Seasonal allergic rhinitis    Tubular adenoma of colon    Wears glasses    Past Surgical History:  Procedure Laterality Date   COLONOSCOPY  last one 10-21-2016  dr Rhea Belton   CYSTOSCOPY WITH INSERTION OF UROLIFT N/A 12/15/2017   Procedure: CYSTOSCOPY WITH INSERTION OF UROLIFT, BIOPSY  PROSTATIC URETHAL;  Surgeon: Marcine Matar, MD;  Location: [facility] [place];  Service: Urology;  Laterality: N/A;   ESOPHAGOGASTRODUODENOSCOPY (EGD) WITH PROPOFOL  last one 09-26-2013   dr pyrtle   INGUINAL HERNIA REPAIR Bilateral right 10-25-1997:  left 09-28-2008 (dr m. Daphine Deutscher @ [facility])   LAPAROSCOPIC CHOLECYSTECTOMY  2012   MANDIBLE SURGERY Bilateral 1990s   removal calcified bone growths    ROTATOR CUFF REPAIR Right 03-26-2009   dr Shelle Iron  [facility]   SHOULDER OPEN ROTATOR CUFF REPAIR  03/17/2012   Procedure: ROTATOR CUFF REPAIR SHOULDER OPEN;  Surgeon: Javier Docker, MD;  Location: [facility];  Service: Orthopedics;  Laterality: Left;  LEFT MINI OPEN ROTATOR CUFF REPAIR    SPACE OAR INSTILLATION N/A 12/15/2017   Procedure: SPACE OAR INSTILLATION;  Surgeon: Marcine Matar, MD;  Location: [facility];  Service: Urology;  Laterality: N/A;   TONSILLECTOMY  child   UPPER GASTROINTESTINAL ENDOSCOPY     Patient Active Problem List   Diagnosis Date Noted   Elevated blood pressure reading 06/11/2022   Seasonal allergies 03/03/2022   Chronic rhinitis 03/06/2021   Nocturnal muscle cramp 09/02/2020   GAD (generalized anxiety disorder) 09/02/2020   Closed nondisplaced fracture of body of left scapula 04/15/2020   Situational anxiety 12/27/2018   Trigger finger of left thumb 11/17/2018   Hyperlipidemia 01/27/2017   Cardiovascular risk factor 08/12/2011   S/P cholecystectomy 06/22/2011   History of colonic polyps 06/22/2011   FLATULENCE-GAS-BLOATING 02/13/2010   Calculus of gallbladder 02/11/2010   GERD 02/10/2010   Diverticulosis of colon 02/10/2010   Nausea 02/10/2010   ABDOMINAL PAIN RIGHT UPPER QUADRANT 02/10/2010   ABDOMINAL PAIN, EPIGASTRIC 02/10/2010  ANEMIA-IRON DEFICIENCY 07/17/2008   Inguinal hernia 07/17/2008   IRRITABLE BOWEL SYNDROME 07/17/2008   Gastritis and gastroduodenitis 07/11/2008   Duodenitis 07/11/2008   RENAL CYST, RIGHT 07/11/2008   DYSPEPSIA 06/12/2008   Constipation 05/31/2008   Backache 05/31/2008   BENIGN PROSTATIC HYPERTROPHY, MILD, HX OF 05/31/2008    PCP: Arva Chafe , DO  REFERRING PROVIDER: same  REFERRING DIAG: L sciatic pain  Rationale for Evaluation and Treatment: Rehabilitation  THERAPY DIAG:  Sciatica, left side  Difficulty in walking, not elsewhere classified  Stiffness of left hip, not elsewhere  classified  ONSET DATE: 3 weeks, Sept 28 2024  SUBJECTIVE:                                                                                                                                                                                           SUBJECTIVE STATEMENT: No change in pain. Pt reports limited walking tolerance d/t hip pain.  PERTINENT HISTORY:  Patient has gone to his physician 2 x , taking ibuprofen and muscle relaxers without much relief.  Referred to PT for eval and treat  PAIN:  Are you having pain? Yes: NPRS scale: 8/10 Pain location: L ant thigh into L lat hip and L SI jt Pain description: cramp that wont let up Aggravating factors: trying to walk, walking dog  Relieving factors: ice, TENS, med, minimal relief  PRECAUTIONS: None  RED FLAGS: None   WEIGHT BEARING RESTRICTIONS: No  FALLS:  Has patient fallen in last 6 months? No  LIVING ENVIRONMENT: Lives with: lives with their spouse Lives in: House/apartment Stairs: No Has following equipment at home: Single point cane  OCCUPATION: retired  PLOF: Independent  PATIENT GOALS: get rid of pain, be able to walk dog  NEXT MD VISIT: unclear  OBJECTIVE:  Note: Objective measures were completed at Evaluation unless otherwise noted.  DIAGNOSTIC FINDINGS:  none  PATIENT SURVEYS:  Modified Oswestry 20/50   SCREENING FOR RED FLAGS: Bowel or bladder incontinence: No Spinal tumors: No Cauda equina syndrome: No Compression fracture: No Abdominal aneurysm: No  COGNITION: Overall cognitive status: Within functional limits for tasks assessed     SENSATION: Reports radicular pain L thigh, no numbness, neural Sx  MUSCLE LENGTH: Hamstrings: Right wfl deg; Left wfl deg [name] test: Right -5 deg; Left -15 deg  POSTURE: flexed trunk  and loss of lumbar lordosis and loss of gluteal mass B  PALPATION: Pt tender proximal L quads, L TFL prox attachment L glut medius and L glut maximus Pain with PA  segmental testing into L SI jt at L4/5 and L5/S1  LUMBAR ROM:   AROM eval  Flexion 70  Extension 50 P! L SI  Right lateral flexion 50  Left lateral flexion 50 p! L SI  Right rotation   Left rotation    (Blank rows = not tested)  LOWER EXTREMITY ROM:     Passive  Right eval Left eval  Hip flexion 125 110  Hip extension -5 -15  Hip abduction    Hip adduction    Hip internal rotation 12 3  Hip external rotation -20 -15  Knee flexion    Knee extension    Ankle dorsiflexion    Ankle plantarflexion    Ankle inversion    Ankle eversion     (Blank rows = wfl)  LOWER EXTREMITY MMT:    MMT Right eval Left eval  Hip flexion  P! 3-  Hip extension    Hip abduction  5  Hip adduction    Hip internal rotation    Hip external rotation  5  Knee flexion    Knee extension  4-/5 P!  Ankle dorsiflexion 4 4  Ankle plantarflexion 4 4  Ankle inversion    Ankle eversion     (Blank rows = wfl)  LUMBAR SPECIAL TESTS:  Straight leg raise test: Negative and FABER test: Negative  FUNCTIONAL TESTS:  30 sec sit to stand TBD   6 min walk test  unable today, antalgic/painful gait  GAIT: Distance walked: 12' Assistive device utilized: None Level of assistance: Complete Independence Comments: antalgic on L, decreased stance time on L Also noted L hip remains in flexed position to 15 degrees in supine  TODAY'S TREATMENT:                                                                                                                              DATE:  03/10/23 Therapeutic Exercise: to improve strength and mobility.  Demo, verbal and tactile cues throughout for technique.  Bike L2x19min L thomas stretch 2x30 sec Supine hip ADD with ball 10x5" Supine hip ABD isometric 5x5"  Manual Therapy: to decrease muscle spasm, pain and improve mobility.  Manual L hip distraction in supine S/L R hip flexor stretch 3x prolonged hold Hip inferior glide with small oscillations  03/04/23: Manual:   Mob with movement for L hip flexion, 3 bouts 12 reps, utilized for assessment purposes and pt had improved pain and movement L hip so incorporated into treatment , lateral distraction of L femur with flexion.    Also R side lying with pillow between legs , cross friction massage L lateral hip musculature,  TFL, glut med  Instructed pt in seated hip abd with green t band, 15 reps    PATIENT EDUCATION:  Education details: POC, goals Person educated: Patient Education method: Explanation, Demonstration, and Tactile cues Education comprehension: verbalized understanding, returned demonstration, and verbal cues required  HOME EXERCISE PROGRAM: Provided written directions for the theraband hip abd, advised to perform 2 x day x 15 reps   ASSESSMENT:  CLINICAL IMPRESSION: Patient is a 76 y.o. male who was treated today by skilled physical therapy to address sudden onset of L SI pain. Pt continues reporting difficulty with walking longer for 20 min. Manual techniques to decrease L thigh pain and improve mobility. Isometric strengthening for hips and SI joint re-alignment done today as well. Pt benefits from continued PT to improve functional capacity.   OBJECTIVE IMPAIRMENTS: Abnormal gait, decreased activity tolerance, decreased endurance, difficulty walking, decreased ROM, decreased strength, hypomobility, and pain.   ACTIVITY LIMITATIONS: carrying, lifting, bending, squatting, sleeping, stairs, and locomotion level  PARTICIPATION LIMITATIONS: laundry, driving, shopping, community activity, and yard work  PERSONAL FACTORS: Age, Behavior pattern, Past/current experiences, Time since onset of injury/illness/exacerbation, and 1-2 comorbidities: h/o prostate ca, h/o rotator cuff repair  are also affecting patient's functional outcome.   REHAB POTENTIAL: Good  CLINICAL DECISION MAKING: Stable/uncomplicated  EVALUATION COMPLEXITY: Low   GOALS: Goals reviewed with patient? Yes  SHORT TERM  GOALS: Target date: 03/18/23  I HEP Baseline: Goal status: INITIAL   LONG TERM GOALS: Target date: 04/29/23  Modified oswestry decrease from 20/50 to 10/50 Baseline:  Goal status: INITIAL  2.  Improve L hip ROM for flexion to 115 and IR to 10 from 110 and 3 for impoved standing tolerance, gait mechanics Baseline:  Goal status: INITIAL  3.  Able to complete 6 min walk test without L thigh/post hip pain for improved function  Baseline: pain with all gait Goal status: INITIAL  4.  Improve L quads strength to 4+/5 for improved ability to descend hills, walk Baseline:  Goal status: INITIAL  PLAN:  PT FREQUENCY: 2x/week  PT DURATION: 8 weeks  PLANNED INTERVENTIONS: 97110-Therapeutic exercises, 97530- Therapeutic activity, [id number]- Neuromuscular re-education, 97535- Self Care, 16109- Manual therapy, and 97116- Gait training.  PLAN FOR NEXT SESSION: How did patient respond to the stretching initial visit, possibly TPDN L lat/post hip, L quads strengthening, L ant hip, thigh stretching.   Darleene Cleaver, PTA 03/10/2023, 3:58 PM

## 2023-03-15 ENCOUNTER — Other Ambulatory Visit: Payer: Self-pay | Admitting: Family Medicine

## 2023-03-15 MED ORDER — CYCLOBENZAPRINE HCL 10 MG PO TABS
5.0000 mg | ORAL_TABLET | Freq: Three times a day (TID) | ORAL | 0 refills | Status: DC | PRN
Start: 2023-03-15 — End: 2023-09-14

## 2023-03-16 ENCOUNTER — Ambulatory Visit: Payer: Medicare Other

## 2023-03-16 ENCOUNTER — Other Ambulatory Visit: Payer: Self-pay

## 2023-03-16 DIAGNOSIS — M25652 Stiffness of left hip, not elsewhere classified: Secondary | ICD-10-CM | POA: Diagnosis not present

## 2023-03-16 DIAGNOSIS — M5432 Sciatica, left side: Secondary | ICD-10-CM | POA: Diagnosis not present

## 2023-03-16 DIAGNOSIS — M5386 Other specified dorsopathies, lumbar region: Secondary | ICD-10-CM | POA: Diagnosis not present

## 2023-03-16 DIAGNOSIS — M545 Low back pain, unspecified: Secondary | ICD-10-CM | POA: Diagnosis not present

## 2023-03-16 DIAGNOSIS — R262 Difficulty in walking, not elsewhere classified: Secondary | ICD-10-CM | POA: Diagnosis not present

## 2023-03-16 NOTE — Therapy (Signed)
OUTPATIENT PHYSICAL THERAPY THORACOLUMBAR TREATMENT   Patient Name: Manuel Landry MRN: [id number] DOB:11-15-1945, 77 y.o., male Today's Date: 03/16/2023  END OF SESSION:  PT End of Session - 03/16/23 0928     Visit Number 3    Date for PT Re-Evaluation 04/29/23    Progress Note Due on Visit 10    PT Start Time 0929    PT Stop Time 1013    PT Time Calculation (min) 44 min    Activity Tolerance Patient tolerated treatment well    Behavior During Therapy [facility] for tasks assessed/performed               Past Medical History:  Diagnosis Date   Allergy    Anxiety    Arthritis    Blind loop syndrome    Bulging discs    LOWER BACK   Cataract    "beginnings of"   Diverticulosis    Family history of malignant neoplasm of gastrointestinal tract    History of adenomatous polyp of colon    History of Barrett's esophagus    History of chronic gastritis    Hyperlipidemia    no meds taken   IBS (irritable bowel syndrome)    Internal hemorrhoids    Malignant neoplasm of prostate [facility]) urologist-  dr dahlstedt/  oncologist-  dr Kathrynn Running   dx 08-09-2017-- Stage T2a, Gleason 4+5, PSA 3.22 (on finateride)--- treatment ADT and external beam radiation   PONV (postoperative nausea and vomiting)    Seasonal allergic rhinitis    Tubular adenoma of colon    Wears glasses    Past Surgical History:  Procedure Laterality Date   COLONOSCOPY  last one 10-21-2016  dr Rhea Belton   CYSTOSCOPY WITH INSERTION OF UROLIFT N/A 12/15/2017   Procedure: CYSTOSCOPY WITH INSERTION OF UROLIFT, BIOPSY  PROSTATIC URETHAL;  Surgeon: Marcine Matar, MD;  Location: [facility] [place];  Service: Urology;  Laterality: N/A;   ESOPHAGOGASTRODUODENOSCOPY (EGD) WITH PROPOFOL  last one 09-26-2013   dr pyrtle   INGUINAL HERNIA REPAIR Bilateral right 10-25-1997:  left 09-28-2008 (dr m. Daphine Deutscher @ [facility])   LAPAROSCOPIC CHOLECYSTECTOMY  2012   MANDIBLE SURGERY Bilateral 1990s   removal calcified bone growths    ROTATOR CUFF REPAIR Right 03-26-2009   dr Shelle Iron  [facility]   SHOULDER OPEN ROTATOR CUFF REPAIR  03/17/2012   Procedure: ROTATOR CUFF REPAIR SHOULDER OPEN;  Surgeon: Javier Docker, MD;  Location: [facility];  Service: Orthopedics;  Laterality: Left;  LEFT MINI OPEN ROTATOR CUFF REPAIR    SPACE OAR INSTILLATION N/A 12/15/2017   Procedure: SPACE OAR INSTILLATION;  Surgeon: Marcine Matar, MD;  Location: [facility] [place];  Service: Urology;  Laterality: N/A;   TONSILLECTOMY  child   UPPER GASTROINTESTINAL ENDOSCOPY     Patient Active Problem List   Diagnosis Date Noted   Elevated blood pressure reading 06/11/2022   Seasonal allergies 03/03/2022   Chronic rhinitis 03/06/2021   Nocturnal muscle cramp 09/02/2020   GAD (generalized anxiety disorder) 09/02/2020   Closed nondisplaced fracture of body of left scapula 04/15/2020   Situational anxiety 12/27/2018   Trigger finger of left thumb 11/17/2018   Hyperlipidemia 01/27/2017   Cardiovascular risk factor 08/12/2011   S/P cholecystectomy 06/22/2011   History of colonic polyps 06/22/2011   FLATULENCE-GAS-BLOATING 02/13/2010   Calculus of gallbladder 02/11/2010   GERD 02/10/2010   Diverticulosis of colon 02/10/2010   Nausea 02/10/2010   ABDOMINAL PAIN RIGHT UPPER QUADRANT 02/10/2010   ABDOMINAL PAIN, EPIGASTRIC 02/10/2010  ANEMIA-IRON DEFICIENCY 07/17/2008   Inguinal hernia 07/17/2008   IRRITABLE BOWEL SYNDROME 07/17/2008   Gastritis and gastroduodenitis 07/11/2008   Duodenitis 07/11/2008   RENAL CYST, RIGHT 07/11/2008   DYSPEPSIA 06/12/2008   Constipation 05/31/2008   Backache 05/31/2008   BENIGN PROSTATIC HYPERTROPHY, MILD, HX OF 05/31/2008    PCP: Arva Chafe , DO  REFERRING PROVIDER: same  REFERRING DIAG: L sciatic pain  Rationale for Evaluation and Treatment: Rehabilitation  THERAPY DIAG:  Sciatica, left side  Difficulty in walking, not elsewhere classified  Stiffness of left hip, not elsewhere  classified  Decreased ROM of lumbar spine  ONSET DATE: 3 weeks, Sept 28 2024  SUBJECTIVE:                                                                                                                                                                                           SUBJECTIVE STATEMENT: L gluteal pain, worse in am, improves throughout day, sometimes in L groin Pt reports limited walking tolerance d/t hip pain, particularly on uneven terrain  PERTINENT HISTORY:  Patient has gone to his physician 2 x , taking ibuprofen and muscle relaxers without much relief.  Referred to PT for eval and treat  PAIN:  Are you having pain? Yes: NPRS scale: 8/10 Pain location: L ant thigh into L lat hip and L SI jt Pain description: cramp that wont let up Aggravating factors: trying to walk, walking dog  Relieving factors: ice, TENS, med, minimal relief  PRECAUTIONS: None  RED FLAGS: None   WEIGHT BEARING RESTRICTIONS: No  FALLS:  Has patient fallen in last 6 months? No  LIVING ENVIRONMENT: Lives with: lives with their spouse Lives in: House/apartment Stairs: No Has following equipment at home: Single point cane  OCCUPATION: retired  PLOF: Independent  PATIENT GOALS: get rid of pain, be able to walk dog  NEXT MD VISIT: unclear  OBJECTIVE:  Note: Objective measures were completed at Evaluation unless otherwise noted.  DIAGNOSTIC FINDINGS:  none  PATIENT SURVEYS:  Modified Oswestry 20/50   SCREENING FOR RED FLAGS: Bowel or bladder incontinence: No Spinal tumors: No Cauda equina syndrome: No Compression fracture: No Abdominal aneurysm: No  COGNITION: Overall cognitive status: Within functional limits for tasks assessed     SENSATION: Reports radicular pain L thigh, no numbness, neural Sx  MUSCLE LENGTH: Hamstrings: Right wfl deg; Left wfl deg [name] test: Right -5 deg; Left -15 deg  POSTURE: flexed trunk  and loss of lumbar lordosis and loss of gluteal mass  B  PALPATION: Pt tender proximal L quads, L TFL prox attachment L glut medius and L glut maximus Pain  with PA segmental testing into L SI jt at L4/5 and L5/S1  LUMBAR ROM:   AROM eval  Flexion 70  Extension 50 P! L SI  Right lateral flexion 50  Left lateral flexion 50 p! L SI  Right rotation   Left rotation    (Blank rows = not tested)  LOWER EXTREMITY ROM:     Passive  Right eval Left eval  Hip flexion 125 110  Hip extension -5 -15  Hip abduction    Hip adduction    Hip internal rotation 12 3  Hip external rotation -20 -15  Knee flexion    Knee extension    Ankle dorsiflexion    Ankle plantarflexion    Ankle inversion    Ankle eversion     (Blank rows = wfl)  LOWER EXTREMITY MMT:    MMT Right eval Left eval  Hip flexion  P! 3-  Hip extension    Hip abduction  5  Hip adduction    Hip internal rotation    Hip external rotation  5  Knee flexion    Knee extension  4-/5 P!  Ankle dorsiflexion 4 4  Ankle plantarflexion 4 4  Ankle inversion    Ankle eversion     (Blank rows = wfl)  LUMBAR SPECIAL TESTS:  Straight leg raise test: Negative and FABER test: Negative  FUNCTIONAL TESTS:  30 sec sit to stand TBD   6 min walk test  unable today, antalgic/painful gait  GAIT: Distance walked: 30' Assistive device utilized: None Level of assistance: Complete Independence Comments: antalgic on L, decreased stance time on L Also noted L hip remains in flexed position to 15 degrees in supine  TODAY'S TREATMENT:                                                                                                                              DATE:  03/16/23: Manual:supine for mobilization with movement for L hip flexibility, focused on L hip flexion: 2 sets 15 reps , with lateral glide L femoral  head throughout movement  R side lying for deep massage with theragun, focused primarily on piriformis and post hip capsule  Therex: supine for manually resisted L hip and knee  extension from 90 degrees flexed position, 10 reps Supine bridging , thighs over therapeutic ball Supine B knee to chest, heels on ball Supine LTR thighs on ball    03/10/23 Therapeutic Exercise: to improve strength and mobility.  Demo, verbal and tactile cues throughout for technique.  Bike L2x69min L thomas stretch 2x30 sec Supine hip ADD with ball 10x5" Supine hip ABD isometric 5x5"  Manual Therapy: to decrease muscle spasm, pain and improve mobility.  Manual L hip distraction in supine S/L R hip flexor stretch 3x prolonged hold Hip inferior glide with small oscillations  03/04/23: Manual:  Mob with movement for L hip flexion, 3 bouts 12 reps, utilized for assessment purposes and pt had improved  pain and movement L hip so incorporated into treatment , lateral distraction of L femur with flexion.    Also R side lying with pillow between legs , cross friction massage L lateral hip musculature,  TFL, glut med  Instructed pt in seated hip abd with green t band, 15 reps    PATIENT EDUCATION:  Education details: POC, goals Person educated: Patient Education method: Explanation, Demonstration, and Tactile cues Education comprehension: verbalized understanding, returned demonstration, and verbal cues required  HOME EXERCISE PROGRAM: Provided written directions for the theraband hip abd, advised to perform 2 x day x 15 reps   ASSESSMENT:  CLINICAL IMPRESSION: Patient is a 77 y.o. male who participated today with  skilled physical therapy to address sudden onset of L SI pain. Pt continues reporting difficulty with walking longer for 20 min, has antalgic gait L  today.  Improved flexibility L hip as compared to initial session.  Having pain L groin consistently so sent note to referring MD to request x ray L hip jt. Improved post session.  Pt benefits from continued PT to improve functional capacity.     OBJECTIVE IMPAIRMENTS: Abnormal gait, decreased activity tolerance, decreased  endurance, difficulty walking, decreased ROM, decreased strength, hypomobility, and pain.   ACTIVITY LIMITATIONS: carrying, lifting, bending, squatting, sleeping, stairs, and locomotion level  PARTICIPATION LIMITATIONS: laundry, driving, shopping, community activity, and yard work  PERSONAL FACTORS: Age, Behavior pattern, Past/current experiences, Time since onset of injury/illness/exacerbation, and 1-2 comorbidities: h/o prostate ca, h/o rotator cuff repair  are also affecting patient's functional outcome.   REHAB POTENTIAL: Good  CLINICAL DECISION MAKING: Stable/uncomplicated  EVALUATION COMPLEXITY: Low   GOALS: Goals reviewed with patient? Yes  SHORT TERM GOALS: Target date: 03/18/23  I HEP Baseline: Goal status: INITIAL   LONG TERM GOALS: Target date: 04/29/23  Modified oswestry decrease from 20/50 to 10/50 Baseline:  Goal status: INITIAL  2.  Improve L hip ROM for flexion to 115 and IR to 10 from 110 and 3 for impoved standing tolerance, gait mechanics Baseline:  Goal status: INITIAL  3.  Able to complete 6 min walk test without L thigh/post hip pain for improved function  Baseline: pain with all gait Goal status: INITIAL  4.  Improve L quads strength to 4+/5 for improved ability to descend hills, walk Baseline:  Goal status: INITIAL  PLAN:  PT FREQUENCY: 2x/week  PT DURATION: 8 weeks  PLANNED INTERVENTIONS: 97110-Therapeutic exercises, 97530- Therapeutic activity, [id number]- Neuromuscular re-education, 97535- Self Care, 16109- Manual therapy, and 97116- Gait training.  PLAN FOR NEXT SESSION: How did patient respond to the stretching initial visit, possibly TPDN L lat/post hip, L quads strengthening, L ant hip, thigh stretching.   Jai Steil L Talah Cookston, PT, DPT, OCS 03/16/2023, 1:02 PM

## 2023-03-17 ENCOUNTER — Telehealth: Payer: Self-pay

## 2023-03-17 ENCOUNTER — Other Ambulatory Visit: Payer: Self-pay

## 2023-03-17 DIAGNOSIS — M545 Low back pain, unspecified: Secondary | ICD-10-CM

## 2023-03-17 DIAGNOSIS — M25552 Pain in left hip: Secondary | ICD-10-CM

## 2023-03-17 NOTE — Telephone Encounter (Signed)
Called spoke with pt regarding imaging on left hip And he stated to send it downstairs. Orders was placed  for downstairs.

## 2023-03-18 ENCOUNTER — Ambulatory Visit (HOSPITAL_BASED_OUTPATIENT_CLINIC_OR_DEPARTMENT_OTHER)
Admission: RE | Admit: 2023-03-18 | Discharge: 2023-03-18 | Disposition: A | Discharge status: Home / Self Care | Payer: Medicare Other | Source: Ambulatory Visit | Attending: Family Medicine | Admitting: Family Medicine

## 2023-03-18 ENCOUNTER — Other Ambulatory Visit: Payer: Self-pay

## 2023-03-18 DIAGNOSIS — M545 Low back pain, unspecified: Secondary | ICD-10-CM | POA: Insufficient documentation

## 2023-03-18 DIAGNOSIS — M160 Bilateral primary osteoarthritis of hip: Secondary | ICD-10-CM | POA: Diagnosis not present

## 2023-03-18 DIAGNOSIS — M25552 Pain in left hip: Secondary | ICD-10-CM | POA: Diagnosis not present

## 2023-03-19 ENCOUNTER — Ambulatory Visit: Payer: Medicare Other | Attending: Family Medicine

## 2023-03-19 DIAGNOSIS — M5432 Sciatica, left side: Secondary | ICD-10-CM

## 2023-03-19 DIAGNOSIS — M5386 Other specified dorsopathies, lumbar region: Secondary | ICD-10-CM | POA: Insufficient documentation

## 2023-03-19 DIAGNOSIS — M25652 Stiffness of left hip, not elsewhere classified: Secondary | ICD-10-CM | POA: Diagnosis not present

## 2023-03-19 DIAGNOSIS — R262 Difficulty in walking, not elsewhere classified: Secondary | ICD-10-CM | POA: Diagnosis not present

## 2023-03-19 NOTE — Therapy (Signed)
OUTPATIENT PHYSICAL THERAPY THORACOLUMBAR TREATMENT   Patient Name: Manuel Landry MRN: [id number] DOB:1945/06/24, 77 y.o., male Today's Date: 03/19/2023  END OF SESSION:  PT End of Session - 03/19/23 0813     Visit Number 4    Date for PT Re-Evaluation 04/29/23    Progress Note Due on Visit 10    PT Start Time 0800    PT Stop Time 0848    PT Time Calculation (min) 48 min    Activity Tolerance Patient tolerated treatment well    Behavior During Therapy WFL for tasks assessed/performed                Past Medical History:  Diagnosis Date   Allergy    Anxiety    Arthritis    Blind loop syndrome    Bulging discs    LOWER BACK   Cataract    "beginnings of"   Diverticulosis    Family history of malignant neoplasm of gastrointestinal tract    History of adenomatous polyp of colon    History of Barrett's esophagus    History of chronic gastritis    Hyperlipidemia    no meds taken   IBS (irritable bowel syndrome)    Internal hemorrhoids    Malignant neoplasm of prostate [facility]) urologist-  dr dahlstedt/  oncologist-  dr Kathrynn Running   dx 08-09-2017-- Stage T2a, Gleason 4+5, PSA 3.22 (on finateride)--- treatment ADT and external beam radiation   PONV (postoperative nausea and vomiting)    Seasonal allergic rhinitis    Tubular adenoma of colon    Wears glasses    Past Surgical History:  Procedure Laterality Date   COLONOSCOPY  last one 10-21-2016  dr Rhea Belton   CYSTOSCOPY WITH INSERTION OF UROLIFT N/A 12/15/2017   Procedure: CYSTOSCOPY WITH INSERTION OF UROLIFT, BIOPSY  PROSTATIC URETHAL;  Surgeon: Marcine Matar, MD;  Location: [facility] [place];  Service: Urology;  Laterality: N/A;   ESOPHAGOGASTRODUODENOSCOPY (EGD) WITH PROPOFOL  last one 09-26-2013   dr pyrtle   INGUINAL HERNIA REPAIR Bilateral right 10-25-1997:  left 09-28-2008 (dr m. Daphine Deutscher @ [facility])   LAPAROSCOPIC CHOLECYSTECTOMY  2012   MANDIBLE SURGERY Bilateral 1990s   removal calcified bone growths    ROTATOR CUFF REPAIR Right 03-26-2009   dr Shelle Iron  [facility]   SHOULDER OPEN ROTATOR CUFF REPAIR  03/17/2012   Procedure: ROTATOR CUFF REPAIR SHOULDER OPEN;  Surgeon: Javier Docker, MD;  Location: [facility];  Service: Orthopedics;  Laterality: Left;  LEFT MINI OPEN ROTATOR CUFF REPAIR    SPACE OAR INSTILLATION N/A 12/15/2017   Procedure: SPACE OAR INSTILLATION;  Surgeon: Marcine Matar, MD;  Location: [facility];  Service: Urology;  Laterality: N/A;   TONSILLECTOMY  child   UPPER GASTROINTESTINAL ENDOSCOPY     Patient Active Problem List   Diagnosis Date Noted   Elevated blood pressure reading 06/11/2022   Seasonal allergies 03/03/2022   Chronic rhinitis 03/06/2021   Nocturnal muscle cramp 09/02/2020   GAD (generalized anxiety disorder) 09/02/2020   Closed nondisplaced fracture of body of left scapula 04/15/2020   Situational anxiety 12/27/2018   Trigger finger of left thumb 11/17/2018   Hyperlipidemia 01/27/2017   Cardiovascular risk factor 08/12/2011   S/P cholecystectomy 06/22/2011   History of colonic polyps 06/22/2011   FLATULENCE-GAS-BLOATING 02/13/2010   Calculus of gallbladder 02/11/2010   GERD 02/10/2010   Diverticulosis of colon 02/10/2010   Nausea 02/10/2010   ABDOMINAL PAIN RIGHT UPPER QUADRANT 02/10/2010   ABDOMINAL PAIN, EPIGASTRIC  02/10/2010   ANEMIA-IRON DEFICIENCY 07/17/2008   Inguinal hernia 07/17/2008   IRRITABLE BOWEL SYNDROME 07/17/2008   Gastritis and gastroduodenitis 07/11/2008   Duodenitis 07/11/2008   RENAL CYST, RIGHT 07/11/2008   DYSPEPSIA 06/12/2008   Constipation 05/31/2008   Backache 05/31/2008   BENIGN PROSTATIC HYPERTROPHY, MILD, HX OF 05/31/2008    PCP: Arva Chafe , DO  REFERRING PROVIDER: same  REFERRING DIAG: L sciatic pain  Rationale for Evaluation and Treatment: Rehabilitation  THERAPY DIAG:  Sciatica, left side  Difficulty in walking, not elsewhere classified  Stiffness of left hip, not elsewhere  classified  ONSET DATE: 3 weeks, Sept 28 2024  SUBJECTIVE:                                                                                                                                                                                           SUBJECTIVE STATEMENT: Got out of bed yesterday, felt a cramp in L hamstring. Also got an x-ray yesterday.  PERTINENT HISTORY:  Patient has gone to his physician 2 x , taking ibuprofen and muscle relaxers without much relief.  Referred to PT for eval and treat  PAIN:  Are you having pain? Yes: NPRS scale: 8/10 Pain location: L ant thigh into L lat hip and L SI jt Pain description: cramp that wont let up Aggravating factors: trying to walk, walking dog  Relieving factors: ice, TENS, med, minimal relief  PRECAUTIONS: None  RED FLAGS: None   WEIGHT BEARING RESTRICTIONS: No  FALLS:  Has patient fallen in last 6 months? No  LIVING ENVIRONMENT: Lives with: lives with their spouse Lives in: House/apartment Stairs: No Has following equipment at home: Single point cane  OCCUPATION: retired  PLOF: Independent  PATIENT GOALS: get rid of pain, be able to walk dog  NEXT MD VISIT: unclear  OBJECTIVE:  Note: Objective measures were completed at Evaluation unless otherwise noted.  DIAGNOSTIC FINDINGS:  none  PATIENT SURVEYS:  Modified Oswestry 20/50   SCREENING FOR RED FLAGS: Bowel or bladder incontinence: No Spinal tumors: No Cauda equina syndrome: No Compression fracture: No Abdominal aneurysm: No  COGNITION: Overall cognitive status: Within functional limits for tasks assessed     SENSATION: Reports radicular pain L thigh, no numbness, neural Sx  MUSCLE LENGTH: Hamstrings: Right wfl deg; Left wfl deg [name] test: Right -5 deg; Left -15 deg  POSTURE: flexed trunk  and loss of lumbar lordosis and loss of gluteal mass B  PALPATION: Pt tender proximal L quads, L TFL prox attachment L glut medius and L glut maximus Pain  with PA segmental testing into L SI jt at L4/5 and L5/S1  LUMBAR ROM:   AROM eval  Flexion 70  Extension 50 P! L SI  Right lateral flexion 50  Left lateral flexion 50 p! L SI  Right rotation   Left rotation    (Blank rows = not tested)  LOWER EXTREMITY ROM:     Passive  Right eval Left eval  Hip flexion 125 110  Hip extension -5 -15  Hip abduction    Hip adduction    Hip internal rotation 12 3  Hip external rotation -20 -15  Knee flexion    Knee extension    Ankle dorsiflexion    Ankle plantarflexion    Ankle inversion    Ankle eversion     (Blank rows = wfl)  LOWER EXTREMITY MMT:    MMT Right eval Left eval  Hip flexion  P! 3-  Hip extension    Hip abduction  5  Hip adduction    Hip internal rotation    Hip external rotation  5  Knee flexion    Knee extension  4-/5 P!  Ankle dorsiflexion 4 4  Ankle plantarflexion 4 4  Ankle inversion    Ankle eversion     (Blank rows = wfl)  LUMBAR SPECIAL TESTS:  Straight leg raise test: Negative and FABER test: Negative  FUNCTIONAL TESTS:  30 sec sit to stand TBD   6 min walk test  unable today, antalgic/painful gait  GAIT: Distance walked: 8' Assistive device utilized: None Level of assistance: Complete Independence Comments: antalgic on L, decreased stance time on L Also noted L hip remains in flexed position to 15 degrees in supine  TODAY'S TREATMENT:                                                                                                                              DATE: 03/19/23 Nustep L5x54min LE only Deadlift with 1 UE support x 10 Step ups 6' x 10 BLE Lateral step ups 6'x 10 BLE Standing straight leg hip flexion x 10 BLE IASTM with massage gun to L proximal HS and glutes Seated L hamstring stretch 2x15"  03/16/23: Manual:supine for mobilization with movement for L hip flexibility, focused on L hip flexion: 2 sets 15 reps , with lateral glide L femoral  head throughout movement  R side  lying for deep massage with theragun, focused primarily on piriformis and post hip capsule  Therex: supine for manually resisted L hip and knee extension from 90 degrees flexed position, 10 reps Supine bridging , thighs over therapeutic ball Supine B knee to chest, heels on ball Supine LTR thighs on ball    03/10/23 Therapeutic Exercise: to improve strength and mobility.  Demo, verbal and tactile cues throughout for technique.  Bike L2x6min L thomas stretch 2x30 sec Supine hip ADD with ball 10x5" Supine hip ABD isometric 5x5"  Manual Therapy: to decrease muscle spasm, pain and improve mobility.  Manual L hip distraction in supine S/L R hip  flexor stretch 3x prolonged hold Hip inferior glide with small oscillations  03/04/23: Manual:  Mob with movement for L hip flexion, 3 bouts 12 reps, utilized for assessment purposes and pt had improved pain and movement L hip so incorporated into treatment , lateral distraction of L femur with flexion.    Also R side lying with pillow between legs , cross friction massage L lateral hip musculature,  TFL, glut med  Instructed pt in seated hip abd with green t band, 15 reps    PATIENT EDUCATION:  Education details: HEP- hamstring stretch Person educated: Patient Education method: Explanation, Demonstration, and Tactile cues Education comprehension: verbalized understanding, returned demonstration, and verbal cues required  HOME EXERCISE PROGRAM: Provided written directions for the theraband hip abd, advised to perform 2 x day x 15 reps   Access Code: [email or web address] URL: [email or web address] Date: 03/19/2023 Prepared by: Verta Ellen  Exercises - Seated Hamstring Stretch  - 2 x daily - 7 x weekly - 3 sets - 3 reps - 30 sec hold ASSESSMENT:  CLINICAL IMPRESSION: Patient is a 77 y.o. male who participated today with  skilled physical therapy to address sudden onset of L SI pain. Pt with good response to treatment. He does have  reports of L HS cramping when getting out of bed. He shows weakness, sft tissue restriction, and limited mobility in LLE, mostly exacerbating symptoms. He would continue to benefit from skilled therapy to address these deficits.      OBJECTIVE IMPAIRMENTS: Abnormal gait, decreased activity tolerance, decreased endurance, difficulty walking, decreased ROM, decreased strength, hypomobility, and pain.   ACTIVITY LIMITATIONS: carrying, lifting, bending, squatting, sleeping, stairs, and locomotion level  PARTICIPATION LIMITATIONS: laundry, driving, shopping, community activity, and yard work  PERSONAL FACTORS: Age, Behavior pattern, Past/current experiences, Time since onset of injury/illness/exacerbation, and 1-2 comorbidities: h/o prostate ca, h/o rotator cuff repair  are also affecting patient's functional outcome.   REHAB POTENTIAL: Good  CLINICAL DECISION MAKING: Stable/uncomplicated  EVALUATION COMPLEXITY: Low   GOALS: Goals reviewed with patient? Yes  SHORT TERM GOALS: Target date: 03/18/23  I HEP Baseline: Goal status: MET   LONG TERM GOALS: Target date: 04/29/23  Modified oswestry decrease from 20/50 to 10/50 Baseline:  Goal status: INITIAL  2.  Improve L hip ROM for flexion to 115 and IR to 10 from 110 and 3 for impoved standing tolerance, gait mechanics Baseline:  Goal status: INITIAL  3.  Able to complete 6 min walk test without L thigh/post hip pain for improved function  Baseline: pain with all gait Goal status: INITIAL  4.  Improve L quads strength to 4+/5 for improved ability to descend hills, walk Baseline:  Goal status: INITIAL  PLAN:  PT FREQUENCY: 2x/week  PT DURATION: 8 weeks  PLANNED INTERVENTIONS: 97110-Therapeutic exercises, 97530- Therapeutic activity, [id number]- Neuromuscular re-education, 97535- Self Care, 54098- Manual therapy, and 97116- Gait training.  PLAN FOR NEXT SESSION: How did patient respond to the stretching initial visit, possibly  TPDN L lat/post hip, L quads strengthening, L ant hip, thigh stretching.   Darleene Cleaver, PTA 03/19/2023, 8:55 AM

## 2023-03-21 ENCOUNTER — Encounter: Payer: Self-pay | Admitting: Family Medicine

## 2023-03-23 ENCOUNTER — Other Ambulatory Visit: Payer: Self-pay

## 2023-03-23 ENCOUNTER — Ambulatory Visit: Payer: Medicare Other

## 2023-03-23 DIAGNOSIS — M25652 Stiffness of left hip, not elsewhere classified: Secondary | ICD-10-CM

## 2023-03-23 DIAGNOSIS — R252 Cramp and spasm: Secondary | ICD-10-CM

## 2023-03-23 DIAGNOSIS — M5432 Sciatica, left side: Secondary | ICD-10-CM | POA: Diagnosis not present

## 2023-03-23 DIAGNOSIS — M545 Low back pain, unspecified: Secondary | ICD-10-CM

## 2023-03-23 DIAGNOSIS — R262 Difficulty in walking, not elsewhere classified: Secondary | ICD-10-CM | POA: Diagnosis not present

## 2023-03-23 DIAGNOSIS — M5386 Other specified dorsopathies, lumbar region: Secondary | ICD-10-CM | POA: Diagnosis not present

## 2023-03-23 NOTE — Therapy (Signed)
OUTPATIENT PHYSICAL THERAPY THORACOLUMBAR TREATMENT   Patient Name: Manuel Landry MRN: [id number] DOB:03/02/1946, 77 y.o., male Today's Date: 03/23/2023  END OF SESSION:  PT End of Session - 03/23/23 1304     Visit Number 5    Date for PT Re-Evaluation 04/29/23    Progress Note Due on Visit 10    PT Start Time 0930    PT Stop Time 1013    PT Time Calculation (min) 43 min    Activity Tolerance Patient tolerated treatment well    Behavior During Therapy WFL for tasks assessed/performed                 Past Medical History:  Diagnosis Date   Allergy    Anxiety    Arthritis    Blind loop syndrome    Bulging discs    LOWER BACK   Cataract    "beginnings of"   Diverticulosis    Family history of malignant neoplasm of gastrointestinal tract    History of adenomatous polyp of colon    History of Barrett's esophagus    History of chronic gastritis    Hyperlipidemia    no meds taken   IBS (irritable bowel syndrome)    Internal hemorrhoids    Malignant neoplasm of prostate [facility] urologist-  dr dahlstedt/  oncologist-  dr Kathrynn Running   dx 08-09-2017-- Stage T2a, Gleason 4+5, PSA 3.22 (on finateride)--- treatment ADT and external beam radiation   PONV (postoperative nausea and vomiting)    Seasonal allergic rhinitis    Tubular adenoma of colon    Wears glasses    Past Surgical History:  Procedure Laterality Date   COLONOSCOPY  last one 10-21-2016  dr Rhea Belton   CYSTOSCOPY WITH INSERTION OF UROLIFT N/A 12/15/2017   Procedure: CYSTOSCOPY WITH INSERTION OF UROLIFT, BIOPSY  PROSTATIC URETHAL;  Surgeon: Marcine Matar, MD;  Location: [facility] [place];  Service: Urology;  Laterality: N/A;   ESOPHAGOGASTRODUODENOSCOPY (EGD) WITH PROPOFOL  last one 09-26-2013   dr pyrtle   INGUINAL HERNIA REPAIR Bilateral right 10-25-1997:  left 09-28-2008 (dr m. Daphine Deutscher @ [facility])   LAPAROSCOPIC CHOLECYSTECTOMY  2012   MANDIBLE SURGERY Bilateral 1990s   removal calcified bone  growths   ROTATOR CUFF REPAIR Right 03-26-2009   dr Shelle Iron  [facility]   SHOULDER OPEN ROTATOR CUFF REPAIR  03/17/2012   Procedure: ROTATOR CUFF REPAIR SHOULDER OPEN;  Surgeon: Javier Docker, MD;  Location: [facility];  Service: Orthopedics;  Laterality: Left;  LEFT MINI OPEN ROTATOR CUFF REPAIR    SPACE OAR INSTILLATION N/A 12/15/2017   Procedure: SPACE OAR INSTILLATION;  Surgeon: Marcine Matar, MD;  Location: [facility];  Service: Urology;  Laterality: N/A;   TONSILLECTOMY  child   UPPER GASTROINTESTINAL ENDOSCOPY     Patient Active Problem List   Diagnosis Date Noted   Elevated blood pressure reading 06/11/2022   Seasonal allergies 03/03/2022   Chronic rhinitis 03/06/2021   Nocturnal muscle cramp 09/02/2020   GAD (generalized anxiety disorder) 09/02/2020   Closed nondisplaced fracture of body of left scapula 04/15/2020   Situational anxiety 12/27/2018   Trigger finger of left thumb 11/17/2018   Hyperlipidemia 01/27/2017   Cardiovascular risk factor 08/12/2011   S/P cholecystectomy 06/22/2011   History of colonic polyps 06/22/2011   FLATULENCE-GAS-BLOATING 02/13/2010   Calculus of gallbladder 02/11/2010   GERD 02/10/2010   Diverticulosis of colon 02/10/2010   Nausea 02/10/2010   ABDOMINAL PAIN RIGHT UPPER QUADRANT 02/10/2010   ABDOMINAL PAIN,  EPIGASTRIC 02/10/2010   ANEMIA-IRON DEFICIENCY 07/17/2008   Inguinal hernia 07/17/2008   IRRITABLE BOWEL SYNDROME 07/17/2008   Gastritis and gastroduodenitis 07/11/2008   Duodenitis 07/11/2008   RENAL CYST, RIGHT 07/11/2008   DYSPEPSIA 06/12/2008   Constipation 05/31/2008   Backache 05/31/2008   BENIGN PROSTATIC HYPERTROPHY, MILD, HX OF 05/31/2008    PCP: Arva Chafe , DO  REFERRING PROVIDER: same  REFERRING DIAG: L sciatic pain  Rationale for Evaluation and Treatment: Rehabilitation  THERAPY DIAG:  Sciatica, left side  Difficulty in walking, not elsewhere classified  Stiffness of left hip, not elsewhere  classified  Decreased ROM of lumbar spine  ONSET DATE: 3 weeks, Sept 28 2024  SUBJECTIVE:                                                                                                                                                                                           SUBJECTIVE STATEMENT: No results yet about x ray.  Sunday felt 80 to 90% better, today feel terrible L hip.  L hip weak when I step a certain way  PERTINENT HISTORY:  Patient has gone to his physician 2 x , taking ibuprofen and muscle relaxers without much relief.  Referred to PT for eval and treat  PAIN:  Are you having pain? Yes: NPRS scale: 8/10 Pain location: L ant thigh into L lat hip and L SI jt Pain description: cramp that wont let up Aggravating factors: trying to walk, walking dog  Relieving factors: ice, TENS, med, minimal relief  PRECAUTIONS: None  RED FLAGS: None   WEIGHT BEARING RESTRICTIONS: No  FALLS:  Has patient fallen in last 6 months? No  LIVING ENVIRONMENT: Lives with: lives with their spouse Lives in: House/apartment Stairs: No Has following equipment at home: Single point cane  OCCUPATION: retired  PLOF: Independent  PATIENT GOALS: get rid of pain, be able to walk dog  NEXT MD VISIT: unclear  OBJECTIVE:  Note: Objective measures were completed at Evaluation unless otherwise noted.  DIAGNOSTIC FINDINGS:  none  PATIENT SURVEYS:  Modified Oswestry 20/50   SCREENING FOR RED FLAGS: Bowel or bladder incontinence: No Spinal tumors: No Cauda equina syndrome: No Compression fracture: No Abdominal aneurysm: No  COGNITION: Overall cognitive status: Within functional limits for tasks assessed     SENSATION: Reports radicular pain L thigh, no numbness, neural Sx  MUSCLE LENGTH: Hamstrings: Right wfl deg; Left wfl deg [name] test: Right -5 deg; Left -15 deg  POSTURE: flexed trunk  and loss of lumbar lordosis and loss of gluteal mass B  PALPATION: Pt tender proximal  L quads, L TFL prox attachment L  glut medius and L glut maximus Pain with PA segmental testing into L SI jt at L4/5 and L5/S1  LUMBAR ROM:   AROM eval  Flexion 70  Extension 50 P! L SI  Right lateral flexion 50  Left lateral flexion 50 p! L SI  Right rotation   Left rotation    (Blank rows = not tested)  LOWER EXTREMITY ROM:     Passive  Right eval Left eval  Hip flexion 125 110  Hip extension -5 -15  Hip abduction    Hip adduction    Hip internal rotation 12 3  Hip external rotation -20 -15  Knee flexion    Knee extension    Ankle dorsiflexion    Ankle plantarflexion    Ankle inversion    Ankle eversion     (Blank rows = wfl)  LOWER EXTREMITY MMT:    MMT Right eval Left eval  Hip flexion  P! 3-  Hip extension    Hip abduction  5  Hip adduction    Hip internal rotation    Hip external rotation  5  Knee flexion    Knee extension  4-/5 P!  Ankle dorsiflexion 4 4  Ankle plantarflexion 4 4  Ankle inversion    Ankle eversion     (Blank rows = wfl)  LUMBAR SPECIAL TESTS:  Straight leg raise test: Negative and FABER test: Negative  FUNCTIONAL TESTS:  30 sec sit to stand TBD   6 min walk test  unable today, antalgic/painful gait  GAIT: Distance walked: 77' Assistive device utilized: None Level of assistance: Complete Independence Comments: antalgic on L, decreased stance time on L Also noted L hip remains in flexed position to 15 degrees in supine  TODAY'S TREATMENT:                                                                                                                              DATE: 03/23/23:  Therex:  Nustep level 5, LE's only for 5 min BATCA Knee extension 15# B LE's up, L LE down( eccentric quads) BATCA B knee flexion, L LE up (ecdentric hamstrings) 35#, 3 x15 Standing L hip ext with 2 # 10x  Manual:  performed after Nustep  Supine for mobilization with movement, L hip for flexion, IR , 3 bout each R side lying for deep cross  friction massage L lateral hip musculature, with theragun  03/19/23 Nustep L5x36min LE only Deadlift with 1 UE support x 10 Step ups 6' x 10 BLE Lateral step ups 6'x 10 BLE Standing straight leg hip flexion x 10 BLE IASTM with massage gun to L proximal HS and glutes Seated L hamstring stretch 2x15"  03/16/23: Manual:supine for mobilization with movement for L hip flexibility, focused on L hip flexion: 2 sets 15 reps , with lateral glide L femoral  head throughout movement  R side lying for deep massage with theragun, focused primarily on piriformis and post  hip capsule  Therex: supine for manually resisted L hip and knee extension from 90 degrees flexed position, 10 reps Supine bridging , thighs over therapeutic ball Supine B knee to chest, heels on ball Supine LTR thighs on ball    03/10/23 Therapeutic Exercise: to improve strength and mobility.  Demo, verbal and tactile cues throughout for technique.  Bike L2x37min L thomas stretch 2x30 sec Supine hip ADD with ball 10x5" Supine hip ABD isometric 5x5"  Manual Therapy: to decrease muscle spasm, pain and improve mobility.  Manual L hip distraction in supine S/L R hip flexor stretch 3x prolonged hold Hip inferior glide with small oscillations  03/04/23: Manual:  Mob with movement for L hip flexion, 3 bouts 12 reps, utilized for assessment purposes and pt had improved pain and movement L hip so incorporated into treatment , lateral distraction of L femur with flexion.    Also R side lying with pillow between legs , cross friction massage L lateral hip musculature,  TFL, glut med  Instructed pt in seated hip abd with green t band, 15 reps    PATIENT EDUCATION:  Education details: HEP- hamstring stretch Person educated: Patient Education method: Explanation, Demonstration, and Tactile cues Education comprehension: verbalized understanding, returned demonstration, and verbal cues required  HOME EXERCISE PROGRAM: Provided  written directions for the theraband hip abd, advised to perform 2 x day x 15 reps   Access Code: [email or web address] URL: [email or web address] Date: 03/19/2023 Prepared by: Verta Ellen  Exercises - Seated Hamstring Stretch  - 2 x daily - 7 x weekly - 3 sets - 3 reps - 30 sec hold ASSESSMENT:  CLINICAL IMPRESSION: Patient is a 77 y.o. male who participated today with  skilled physical therapy to address sudden onset of L SI pain. Pt again stiff L hip for flexion and IR especially.  Changed strengthening approach today to quads and hamstrings as he is c/o instability L lateral/post hip with certain reaching movements when planting L LE.  Awaiting results of L hip x ray.   He would continue to benefit from skilled therapy to address these deficits.      OBJECTIVE IMPAIRMENTS: Abnormal gait, decreased activity tolerance, decreased endurance, difficulty walking, decreased ROM, decreased strength, hypomobility, and pain.   ACTIVITY LIMITATIONS: carrying, lifting, bending, squatting, sleeping, stairs, and locomotion level  PARTICIPATION LIMITATIONS: laundry, driving, shopping, community activity, and yard work  PERSONAL FACTORS: Age, Behavior pattern, Past/current experiences, Time since onset of injury/illness/exacerbation, and 1-2 comorbidities: h/o prostate ca, h/o rotator cuff repair  are also affecting patient's functional outcome.   REHAB POTENTIAL: Good  CLINICAL DECISION MAKING: Stable/uncomplicated  EVALUATION COMPLEXITY: Low   GOALS: Goals reviewed with patient? Yes  SHORT TERM GOALS: Target date: 03/18/23  I HEP Baseline: Goal status: MET   LONG TERM GOALS: Target date: 04/29/23  Modified oswestry decrease from 20/50 to 10/50 Baseline:  Goal status: INITIAL  2.  Improve L hip ROM for flexion to 115 and IR to 10 from 110 and 3 for impoved standing tolerance, gait mechanics Baseline:  Goal status: INITIAL  3.  Able to complete 6 min walk test without L  thigh/post hip pain for improved function  Baseline: pain with all gait Goal status: INITIAL  4.  Improve L quads strength to 4+/5 for improved ability to descend hills, walk Baseline:  Goal status: INITIAL  PLAN:  PT FREQUENCY: 2x/week  PT DURATION: 8 weeks  PLANNED INTERVENTIONS: 97110-Therapeutic exercises, 97530- Therapeutic activity, [id number]- Neuromuscular re-education, 97535- Self Care, [id number]-  Manual therapy, and 16109- Gait training.  PLAN FOR NEXT SESSION: How did patient respond to the stretching initial visit, possibly TPDN L lat/post hip, L quads strengthening, L ant hip, thigh stretching.   Lelynd Poer L Derwood Becraft, PT 03/23/2023, 2:49 PM

## 2023-03-24 ENCOUNTER — Other Ambulatory Visit: Payer: Self-pay | Admitting: Family Medicine

## 2023-03-24 ENCOUNTER — Other Ambulatory Visit (HOSPITAL_BASED_OUTPATIENT_CLINIC_OR_DEPARTMENT_OTHER): Payer: Self-pay

## 2023-03-24 DIAGNOSIS — M545 Low back pain, unspecified: Secondary | ICD-10-CM

## 2023-03-24 MED ORDER — IBUPROFEN 800 MG PO TABS
800.0000 mg | ORAL_TABLET | Freq: Three times a day (TID) | ORAL | 0 refills | Status: DC | PRN
  Filled 2023-03-24: qty 30, 10d supply, fill #0

## 2023-03-25 ENCOUNTER — Ambulatory Visit: Payer: Medicare Other

## 2023-03-25 ENCOUNTER — Other Ambulatory Visit: Payer: Self-pay

## 2023-03-25 DIAGNOSIS — M25652 Stiffness of left hip, not elsewhere classified: Secondary | ICD-10-CM | POA: Diagnosis not present

## 2023-03-25 DIAGNOSIS — M5386 Other specified dorsopathies, lumbar region: Secondary | ICD-10-CM

## 2023-03-25 DIAGNOSIS — R262 Difficulty in walking, not elsewhere classified: Secondary | ICD-10-CM | POA: Diagnosis not present

## 2023-03-25 DIAGNOSIS — M5432 Sciatica, left side: Secondary | ICD-10-CM

## 2023-03-25 NOTE — Therapy (Signed)
OUTPATIENT PHYSICAL THERAPY THORACOLUMBAR TREATMENT   Patient Name: Manuel Landry MRN: [id number] DOB:12-May-1946, 77 y.o., male Today's Date: 03/25/2023  END OF SESSION:  PT End of Session - 03/25/23 0837     Visit Number 6    Date for PT Re-Evaluation 04/29/23    Progress Note Due on Visit 10    PT Start Time 0837    PT Stop Time 0918    PT Time Calculation (min) 41 min    Activity Tolerance Patient tolerated treatment well    Behavior During Therapy [facility] for tasks assessed/performed             Past Medical History:  Diagnosis Date   Allergy    Anxiety    Arthritis    Blind loop syndrome    Bulging discs    LOWER BACK   Cataract    "beginnings of"   Diverticulosis    Family history of malignant neoplasm of gastrointestinal tract    History of adenomatous polyp of colon    History of Barrett's esophagus    History of chronic gastritis    Hyperlipidemia    no meds taken   IBS (irritable bowel syndrome)    Internal hemorrhoids    Malignant neoplasm of prostate [facility]) urologist-  dr dahlstedt/  oncologist-  dr Kathrynn Running   dx 08-09-2017-- Stage T2a, Gleason 4+5, PSA 3.22 (on finateride)--- treatment ADT and external beam radiation   PONV (postoperative nausea and vomiting)    Seasonal allergic rhinitis    Tubular adenoma of colon    Wears glasses    Past Surgical History:  Procedure Laterality Date   COLONOSCOPY  last one 10-21-2016  dr Rhea Belton   CYSTOSCOPY WITH INSERTION OF UROLIFT N/A 12/15/2017   Procedure: CYSTOSCOPY WITH INSERTION OF UROLIFT, BIOPSY  PROSTATIC URETHAL;  Surgeon: Marcine Matar, MD;  Location: [facility];  Service: Urology;  Laterality: N/A;   ESOPHAGOGASTRODUODENOSCOPY (EGD) WITH PROPOFOL  last one 09-26-2013   dr pyrtle   INGUINAL HERNIA REPAIR Bilateral right 10-25-1997:  left 09-28-2008 (dr m. Daphine Deutscher @ [facility])   LAPAROSCOPIC CHOLECYSTECTOMY  2012   MANDIBLE SURGERY Bilateral 1990s   removal calcified bone growths    ROTATOR CUFF REPAIR Right 03-26-2009   dr Shelle Iron  [facility]   SHOULDER OPEN ROTATOR CUFF REPAIR  03/17/2012   Procedure: ROTATOR CUFF REPAIR SHOULDER OPEN;  Surgeon: Javier Docker, MD;  Location: [facility];  Service: Orthopedics;  Laterality: Left;  LEFT MINI OPEN ROTATOR CUFF REPAIR    SPACE OAR INSTILLATION N/A 12/15/2017   Procedure: SPACE OAR INSTILLATION;  Surgeon: Marcine Matar, MD;  Location: [facility];  Service: Urology;  Laterality: N/A;   TONSILLECTOMY  child   UPPER GASTROINTESTINAL ENDOSCOPY     Patient Active Problem List   Diagnosis Date Noted   Elevated blood pressure reading 06/11/2022   Seasonal allergies 03/03/2022   Chronic rhinitis 03/06/2021   Nocturnal muscle cramp 09/02/2020   GAD (generalized anxiety disorder) 09/02/2020   Closed nondisplaced fracture of body of left scapula 04/15/2020   Situational anxiety 12/27/2018   Trigger finger of left thumb 11/17/2018   Hyperlipidemia 01/27/2017   Cardiovascular risk factor 08/12/2011   S/P cholecystectomy 06/22/2011   History of colonic polyps 06/22/2011   FLATULENCE-GAS-BLOATING 02/13/2010   Calculus of gallbladder 02/11/2010   GERD 02/10/2010   Diverticulosis of colon 02/10/2010   Nausea 02/10/2010   ABDOMINAL PAIN RIGHT UPPER QUADRANT 02/10/2010   ABDOMINAL PAIN, EPIGASTRIC 02/10/2010  ANEMIA-IRON DEFICIENCY 07/17/2008   Inguinal hernia 07/17/2008   IRRITABLE BOWEL SYNDROME 07/17/2008   Gastritis and gastroduodenitis 07/11/2008   Duodenitis 07/11/2008   RENAL CYST, RIGHT 07/11/2008   DYSPEPSIA 06/12/2008   Constipation 05/31/2008   Backache 05/31/2008   BENIGN PROSTATIC HYPERTROPHY, MILD, HX OF 05/31/2008    PCP: Arva Chafe , DO  REFERRING PROVIDER: same  REFERRING DIAG: L sciatic pain  Rationale for Evaluation and Treatment: Rehabilitation  THERAPY DIAG:  Sciatica, left side  Difficulty in walking, not elsewhere classified  Stiffness of left hip, not elsewhere  classified  Decreased ROM of lumbar spine  ONSET DATE: 3 weeks, Sept 28 2024  SUBJECTIVE:                                                                                                                                                                                           SUBJECTIVE STATEMENT: No results yet about x ray.  Feeling better overall, liked the strengthening last visit.   PERTINENT HISTORY:  Patient has gone to his physician 2 x , taking ibuprofen and muscle relaxers without much relief.  Referred to PT for eval and treat  PAIN:  Are you having pain? Yes: NPRS scale: 8/10 Pain location: L ant thigh into L lat hip and L SI jt Pain description: cramp that wont let up Aggravating factors: trying to walk, walking dog  Relieving factors: ice, TENS, med, minimal relief  PRECAUTIONS: None  RED FLAGS: None   WEIGHT BEARING RESTRICTIONS: No  FALLS:  Has patient fallen in last 6 months? No  LIVING ENVIRONMENT: Lives with: lives with their spouse Lives in: House/apartment Stairs: No Has following equipment at home: Single point cane  OCCUPATION: retired  PLOF: Independent  PATIENT GOALS: get rid of pain, be able to walk dog  NEXT MD VISIT: unclear  OBJECTIVE:  Note: Objective measures were completed at Evaluation unless otherwise noted.  DIAGNOSTIC FINDINGS:  none  PATIENT SURVEYS:  Modified Oswestry 20/50   SCREENING FOR RED FLAGS: Bowel or bladder incontinence: No Spinal tumors: No Cauda equina syndrome: No Compression fracture: No Abdominal aneurysm: No  COGNITION: Overall cognitive status: Within functional limits for tasks assessed     SENSATION: Reports radicular pain L thigh, no numbness, neural Sx  MUSCLE LENGTH: Hamstrings: Right wfl deg; Left wfl deg [name] test: Right -5 deg; Left -15 deg  POSTURE: flexed trunk  and loss of lumbar lordosis and loss of gluteal mass B  PALPATION: Pt tender proximal L quads, L TFL prox attachment L  glut medius and L glut maximus Pain with PA segmental testing into L SI jt at  L4/5 and L5/S1  LUMBAR ROM:   AROM eval  Flexion 70  Extension 50 P! L SI  Right lateral flexion 50  Left lateral flexion 50 p! L SI  Right rotation   Left rotation    (Blank rows = not tested)  LOWER EXTREMITY ROM:     Passive  Right eval Left eval  Hip flexion 125 110  Hip extension -5 -15  Hip abduction    Hip adduction    Hip internal rotation 12 3  Hip external rotation -20 -15  Knee flexion    Knee extension    Ankle dorsiflexion    Ankle plantarflexion    Ankle inversion    Ankle eversion     (Blank rows = wfl)  LOWER EXTREMITY MMT:    MMT Right eval Left eval  Hip flexion  P! 3-  Hip extension    Hip abduction  5  Hip adduction    Hip internal rotation    Hip external rotation  5  Knee flexion    Knee extension  4-/5 P!  Ankle dorsiflexion 4 4  Ankle plantarflexion 4 4  Ankle inversion    Ankle eversion     (Blank rows = wfl)  LUMBAR SPECIAL TESTS:  Straight leg raise test: Negative and FABER test: Negative  FUNCTIONAL TESTS:  30 sec sit to stand TBD   6 min walk test  unable today, antalgic/painful gait  GAIT: Distance walked: 56' Assistive device utilized: None Level of assistance: Complete Independence Comments: antalgic on L, decreased stance time on L Also noted L hip remains in flexed position to 15 degrees in supine  TODAY'S TREATMENT:                                                                                                                              DATE: 03/25/23:  Manual :  Supine for mobilization with movement, lateral/ inf distraction of L femoral head, for L hip flexion/IR, ER 3 bouts  R side lying for deep cross friction massage L lateral hip musculature, with theragun Therex:  Nustep: level 5 x 6 min BATCA Knee extension 15# B LE's up, L LE down( eccentric quads) BATCA B knee flexion, L LE up (ecdentric hamstrings) 35#, 3 x15 Standing  L hip ext 3# cuff wt , 20x  Standing L hip abd 3# cuff wt, 20x  03/23/23:  Therex:  Nustep level 5, LE's only for 5 min BATCA Knee extension 15# B LE's up, L LE down( eccentric quads) BATCA B knee flexion, L LE up (ecdentric hamstrings) 35#, 3 x15 Standing L hip ext with 2 # 10x  Manual:  performed after Nustep  Supine for mobilization with movement, L hip for flexion, IR , 3 bout each R side lying for deep cross friction massage L lateral hip musculature, with theragun  03/19/23 Nustep L5x82min LE only Deadlift with 1 UE support x 10 Step ups 6' x 10 BLE  Lateral step ups 6'x 10 BLE Standing straight leg hip flexion x 10 BLE IASTM with massage gun to L proximal HS and glutes Seated L hamstring stretch 2x15"  03/16/23: Manual:supine for mobilization with movement for L hip flexibility, focused on L hip flexion: 2 sets 15 reps , with lateral glide L femoral  head throughout movement  R side lying for deep massage with theragun, focused primarily on piriformis and post hip capsule  Therex: supine for manually resisted L hip and knee extension from 90 degrees flexed position, 10 reps Supine bridging , thighs over therapeutic ball Supine B knee to chest, heels on ball Supine LTR thighs on ball    03/10/23 Therapeutic Exercise: to improve strength and mobility.  Demo, verbal and tactile cues throughout for technique.  Bike L2x57min L thomas stretch 2x30 sec Supine hip ADD with ball 10x5" Supine hip ABD isometric 5x5"  Manual Therapy: to decrease muscle spasm, pain and improve mobility.  Manual L hip distraction in supine S/L R hip flexor stretch 3x prolonged hold Hip inferior glide with small oscillations  03/04/23: Manual:  Mob with movement for L hip flexion, 3 bouts 12 reps, utilized for assessment purposes and pt had improved pain and movement L hip so incorporated into treatment , lateral distraction of L femur with flexion.    Also R side lying with pillow between legs  , cross friction massage L lateral hip musculature,  TFL, glut med  Instructed pt in seated hip abd with green t band, 15 reps    PATIENT EDUCATION:  Education details: HEP- hamstring stretch Person educated: Patient Education method: Explanation, Demonstration, and Tactile cues Education comprehension: verbalized understanding, returned demonstration, and verbal cues required  HOME EXERCISE PROGRAM: Provided written directions for the theraband hip abd, advised to perform 2 x day x 15 reps   Access Code: [email or web address] URL: [email or web address] Date: 03/19/2023 Prepared by: Verta Ellen  Exercises - Seated Hamstring Stretch  - 2 x daily - 7 x weekly - 3 sets - 3 reps - 30 sec hold ASSESSMENT:  CLINICAL IMPRESSION: Patient is a 77 y.o. male who participated today with  skilled physical therapy to address sudden onset of L SI pain. Pt again stiff L hip for flexion and IR especially.  Reports overall improvement in Sx  still has pain and instability if turning or moving a certain way.responded well to strengthening L quads, hamstrings, hip musculature last visit so continued and progressed these ex again today. still awaiting results of L hip x ray.   He would continue to benefit from skilled therapy to address these deficits.      OBJECTIVE IMPAIRMENTS: Abnormal gait, decreased activity tolerance, decreased endurance, difficulty walking, decreased ROM, decreased strength, hypomobility, and pain.   ACTIVITY LIMITATIONS: carrying, lifting, bending, squatting, sleeping, stairs, and locomotion level  PARTICIPATION LIMITATIONS: laundry, driving, shopping, community activity, and yard work  PERSONAL FACTORS: Age, Behavior pattern, Past/current experiences, Time since onset of injury/illness/exacerbation, and 1-2 comorbidities: h/o prostate ca, h/o rotator cuff repair  are also affecting patient's functional outcome.   REHAB POTENTIAL: Good  CLINICAL DECISION MAKING:  Stable/uncomplicated  EVALUATION COMPLEXITY: Low   GOALS: Goals reviewed with patient? Yes  SHORT TERM GOALS: Target date: 03/18/23  I HEP Baseline: Goal status: MET   LONG TERM GOALS: Target date: 04/29/23  Modified oswestry decrease from 20/50 to 10/50 Baseline:  Goal status: INITIAL  2.  Improve L hip ROM for flexion to 115 and IR to 10 from 110 and  3 for impoved standing tolerance, gait mechanics Baseline:  Goal status: INITIAL  3.  Able to complete 6 min walk test without L thigh/post hip pain for improved function  Baseline: pain with all gait Goal status: INITIAL  4.  Improve L quads strength to 4+/5 for improved ability to descend hills, walk Baseline:  Goal status: INITIAL  PLAN:  PT FREQUENCY: 2x/week  PT DURATION: 8 weeks  PLANNED INTERVENTIONS: 97110-Therapeutic exercises, 97530- Therapeutic activity, [id number]- Neuromuscular re-education, 97535- Self Care, [id number]- Manual therapy, and [id number]- Gait training.  PLAN FOR NEXT SESSION: , L quads strengthening, L ant hip, thigh stretching, manual techniques   Alexsia Klindt L Oriya Kettering, PT, DPT, OCS 03/25/2023, 9:23 AM

## 2023-03-26 ENCOUNTER — Other Ambulatory Visit (HOSPITAL_BASED_OUTPATIENT_CLINIC_OR_DEPARTMENT_OTHER): Payer: Self-pay

## 2023-03-30 ENCOUNTER — Ambulatory Visit (INDEPENDENT_AMBULATORY_CARE_PROVIDER_SITE_OTHER): Payer: Medicare Other | Admitting: Sports Medicine

## 2023-03-30 ENCOUNTER — Other Ambulatory Visit: Payer: Self-pay

## 2023-03-30 ENCOUNTER — Encounter: Payer: Self-pay | Admitting: Sports Medicine

## 2023-03-30 ENCOUNTER — Ambulatory Visit: Payer: Medicare Other

## 2023-03-30 VITALS — BP 120/88 | Ht 71.0 in | Wt 178.0 lb

## 2023-03-30 DIAGNOSIS — M25652 Stiffness of left hip, not elsewhere classified: Secondary | ICD-10-CM

## 2023-03-30 DIAGNOSIS — R29898 Other symptoms and signs involving the musculoskeletal system: Secondary | ICD-10-CM

## 2023-03-30 DIAGNOSIS — R262 Difficulty in walking, not elsewhere classified: Secondary | ICD-10-CM

## 2023-03-30 DIAGNOSIS — M5386 Other specified dorsopathies, lumbar region: Secondary | ICD-10-CM | POA: Diagnosis not present

## 2023-03-30 DIAGNOSIS — M5432 Sciatica, left side: Secondary | ICD-10-CM

## 2023-03-30 NOTE — Therapy (Signed)
OUTPATIENT PHYSICAL THERAPY THORACOLUMBAR TREATMENT   Patient Name: Manuel Landry MRN: [id number] DOB:06/26/1945, 77 y.o., male Today's Date: 03/30/2023  END OF SESSION:  PT End of Session - 03/30/23 0916     Visit Number 7    Date for PT Re-Evaluation 04/29/23    Progress Note Due on Visit 10    Activity Tolerance Patient tolerated treatment well    Behavior During Therapy [name] for tasks assessed/performed              Past Medical History:  Diagnosis Date   Allergy    Anxiety    Arthritis    Blind loop syndrome    Bulging discs    LOWER BACK   Cataract    "beginnings of"   Diverticulosis    Family history of malignant neoplasm of gastrointestinal tract    History of adenomatous polyp of colon    History of Barrett's esophagus    History of chronic gastritis    Hyperlipidemia    no meds taken   IBS (irritable bowel syndrome)    Internal hemorrhoids    Malignant neoplasm of prostate [facility]) urologist-  dr dahlstedt/  oncologist-  dr Kathrynn Running   dx 08-09-2017-- Stage T2a, Gleason 4+5, PSA 3.22 (on finateride)--- treatment ADT and external beam radiation   PONV (postoperative nausea and vomiting)    Seasonal allergic rhinitis    Tubular adenoma of colon    Wears glasses    Past Surgical History:  Procedure Laterality Date   COLONOSCOPY  last one 10-21-2016  dr Rhea Belton   CYSTOSCOPY WITH INSERTION OF UROLIFT N/A 12/15/2017   Procedure: CYSTOSCOPY WITH INSERTION OF UROLIFT, BIOPSY  PROSTATIC URETHAL;  Surgeon: Marcine Matar, MD;  Location: [facility] [place];  Service: Urology;  Laterality: N/A;   ESOPHAGOGASTRODUODENOSCOPY (EGD) WITH PROPOFOL  last one 09-26-2013   dr pyrtle   INGUINAL HERNIA REPAIR Bilateral right 10-25-1997:  left 09-28-2008 (dr m. Daphine Deutscher @ [facility])   LAPAROSCOPIC CHOLECYSTECTOMY  2012   MANDIBLE SURGERY Bilateral 1990s   removal calcified bone growths   ROTATOR CUFF REPAIR Right 03-26-2009   dr Shelle Iron  [place]   SHOULDER OPEN ROTATOR  CUFF REPAIR  03/17/2012   Procedure: ROTATOR CUFF REPAIR SHOULDER OPEN;  Surgeon: Javier Docker, MD;  Location: [facility];  Service: Orthopedics;  Laterality: Left;  LEFT MINI OPEN ROTATOR CUFF REPAIR    SPACE OAR INSTILLATION N/A 12/15/2017   Procedure: SPACE OAR INSTILLATION;  Surgeon: Marcine Matar, MD;  Location: [facility];  Service: Urology;  Laterality: N/A;   TONSILLECTOMY  child   UPPER GASTROINTESTINAL ENDOSCOPY     Patient Active Problem List   Diagnosis Date Noted   Elevated blood pressure reading 06/11/2022   Seasonal allergies 03/03/2022   Chronic rhinitis 03/06/2021   Nocturnal muscle cramp 09/02/2020   GAD (generalized anxiety disorder) 09/02/2020   Closed nondisplaced fracture of body of left scapula 04/15/2020   Situational anxiety 12/27/2018   Trigger finger of left thumb 11/17/2018   Hyperlipidemia 01/27/2017   Cardiovascular risk factor 08/12/2011   S/P cholecystectomy 06/22/2011   History of colonic polyps 06/22/2011   FLATULENCE-GAS-BLOATING 02/13/2010   Calculus of gallbladder 02/11/2010   GERD 02/10/2010   Diverticulosis of colon 02/10/2010   Nausea 02/10/2010   ABDOMINAL PAIN RIGHT UPPER QUADRANT 02/10/2010   ABDOMINAL PAIN, EPIGASTRIC 02/10/2010   ANEMIA-IRON DEFICIENCY 07/17/2008   Inguinal hernia 07/17/2008   IRRITABLE BOWEL SYNDROME 07/17/2008   Gastritis and gastroduodenitis 07/11/2008  Duodenitis 07/11/2008   RENAL CYST, RIGHT 07/11/2008   DYSPEPSIA 06/12/2008   Constipation 05/31/2008   Backache 05/31/2008   BENIGN PROSTATIC HYPERTROPHY, MILD, HX OF 05/31/2008    PCP: Arva Chafe , DO  REFERRING PROVIDER: same  REFERRING DIAG: L sciatic pain  Rationale for Evaluation and Treatment: Rehabilitation  THERAPY DIAG:  Sciatica, left side  Difficulty in walking, not elsewhere classified  Stiffness of left hip, not elsewhere classified  Decreased ROM of lumbar spine  ONSET DATE: 3 weeks, Sept 28  2024  SUBJECTIVE:                                                                                                                                                                                           SUBJECTIVE STATEMENT: No official results yet about x ray L hip but saw Dr Margaretha Sheffield this morning,  he looked at x ray and stated that he felt like my Sx were originated from lumbar radiculopathy, most likely L3 level.  Wants me to continue for 3-4 more weeks.  Feeling better overall, liked the strengthening last visit, but the hamstrings strengthening made me painful.   PERTINENT HISTORY:  Patient has gone to his physician 2 x , taking ibuprofen and muscle relaxers without much relief.  Referred to PT for eval and treat  PAIN:  Are you having pain? Yes: NPRS scale: 8/10 Pain location: L ant thigh into L lat hip and L SI jt Pain description: cramp that wont let up Aggravating factors: trying to walk, walking dog  Relieving factors: ice, TENS, med, minimal relief  PRECAUTIONS: None  RED FLAGS: None   WEIGHT BEARING RESTRICTIONS: No  FALLS:  Has patient fallen in last 6 months? No  LIVING ENVIRONMENT: Lives with: lives with their spouse Lives in: House/apartment Stairs: No Has following equipment at home: Single point cane  OCCUPATION: retired  PLOF: Independent  PATIENT GOALS: get rid of pain, be able to walk dog  NEXT MD VISIT: unclear  OBJECTIVE:  Note: Objective measures were completed at Evaluation unless otherwise noted.  DIAGNOSTIC FINDINGS:  none  PATIENT SURVEYS:  Modified Oswestry 20/50   SCREENING FOR RED FLAGS: Bowel or bladder incontinence: No Spinal tumors: No Cauda equina syndrome: No Compression fracture: No Abdominal aneurysm: No  COGNITION: Overall cognitive status: Within functional limits for tasks assessed     SENSATION: Reports radicular pain L thigh, no numbness, neural Sx  MUSCLE LENGTH: Hamstrings: Right wfl deg; Left wfl deg [name]  test: Right -5 deg; Left -15 deg  POSTURE: flexed trunk  and loss of lumbar lordosis and loss of gluteal mass B  PALPATION:  Pt tender proximal L quads, L TFL prox attachment L glut medius and L glut maximus Pain with PA segmental testing into L SI jt at L4/5 and L5/S1  LUMBAR ROM:   AROM eval  Flexion 70  Extension 50 P! L SI  Right lateral flexion 50  Left lateral flexion 50 p! L SI  Right rotation   Left rotation    (Blank rows = not tested)  LOWER EXTREMITY ROM:     Passive  Right eval Left eval  Hip flexion 125 110  Hip extension -5 -15  Hip abduction    Hip adduction    Hip internal rotation 12 3  Hip external rotation -20 -15  Knee flexion    Knee extension    Ankle dorsiflexion    Ankle plantarflexion    Ankle inversion    Ankle eversion     (Blank rows = wfl)  LOWER EXTREMITY MMT:    MMT Right eval Left eval  Hip flexion  P! 3-  Hip extension    Hip abduction  5  Hip adduction    Hip internal rotation    Hip external rotation  5  Knee flexion    Knee extension  4-/5 P!  Ankle dorsiflexion 4 4  Ankle plantarflexion 4 4  Ankle inversion    Ankle eversion     (Blank rows = wfl)  LUMBAR SPECIAL TESTS:  Straight leg raise test: Negative and FABER test: Negative  FUNCTIONAL TESTS:  30 sec sit to stand TBD   6 min walk test  unable today, antalgic/painful gait  GAIT: Distance walked: 69' Assistive device utilized: None Level of assistance: Complete Independence Comments: antalgic on L, decreased stance time on L Also noted L hip remains in flexed position to 15 degrees in supine  TODAY'S TREATMENT:                                                                                                                              DATE: 03/30/23: Manual :  Supine for mobilization with movement, lateral/ inf distraction of L femoral head, for L hip flexion, 3 bouts  Nustep: level 5 x 6 min BATCA Knee extension 15# B LE's up, L LE down( eccentric  quads) BATCA B knee flexion, L LE up (ecdentric hamstrings) 35#, 3 x15 Standing L hip ext 3# cuff wt , 20x  Standing L hip abd 3# cuff wt, 20x Standing L hip flexion 3# cuff wt 20x Leg press, 25#, B LE's 3 x 10     03/25/23:  Manual :  Supine for mobilization with movement, lateral/ inf distraction of L femoral head, for L hip flexion/IR, ER 3 bouts  R side lying for deep cross friction massage L lateral hip musculature, with theragun Therex:  Nustep: level 5 x 6 min BATCA Knee extension 15# B LE's up, L LE down( eccentric quads) BATCA B knee flexion, L LE up (ecdentric hamstrings) 35#, 3 x15  Standing L hip ext 3# cuff wt , 20x  Standing L hip abd 3# cuff wt, 20x  03/23/23:  Therex:  Nustep level 5, LE's only for 5 min BATCA Knee extension 15# B LE's up, L LE down( eccentric quads) BATCA B knee flexion, L LE up (ecdentric hamstrings) 35#, 3 x15 Standing L hip ext with 2 # 10x  Manual:  performed after Nustep  Supine for mobilization with movement, L hip for flexion, IR , 3 bout each R side lying for deep cross friction massage L lateral hip musculature, with theragun  03/19/23 Nustep L5x15min LE only Deadlift with 1 UE support x 10 Step ups 6' x 10 BLE Lateral step ups 6'x 10 BLE Standing straight leg hip flexion x 10 BLE IASTM with massage gun to L proximal HS and glutes Seated L hamstring stretch 2x15"  03/16/23: Manual:supine for mobilization with movement for L hip flexibility, focused on L hip flexion: 2 sets 15 reps , with lateral glide L femoral  head throughout movement  R side lying for deep massage with theragun, focused primarily on piriformis and post hip capsule  Therex: supine for manually resisted L hip and knee extension from 90 degrees flexed position, 10 reps Supine bridging , thighs over therapeutic ball Supine B knee to chest, heels on ball Supine LTR thighs on ball    03/10/23 Therapeutic Exercise: to improve strength and mobility.  Demo, verbal  and tactile cues throughout for technique.  Bike L2x54min L thomas stretch 2x30 sec Supine hip ADD with ball 10x5" Supine hip ABD isometric 5x5"  Manual Therapy: to decrease muscle spasm, pain and improve mobility.  Manual L hip distraction in supine S/L R hip flexor stretch 3x prolonged hold Hip inferior glide with small oscillations  03/04/23: Manual:  Mob with movement for L hip flexion, 3 bouts 12 reps, utilized for assessment purposes and pt had improved pain and movement L hip so incorporated into treatment , lateral distraction of L femur with flexion.    Also R side lying with pillow between legs , cross friction massage L lateral hip musculature,  TFL, glut med  Instructed pt in seated hip abd with green t band, 15 reps    PATIENT EDUCATION:  Education details: HEP- hamstring stretch Person educated: Patient Education method: Explanation, Demonstration, and Tactile cues Education comprehension: verbalized understanding, returned demonstration, and verbal cues required  HOME EXERCISE PROGRAM: Provided written directions for the theraband hip abd, advised to perform 2 x day x 15 reps   Access Code: [email or web address] URL: [email or web address] Date: 03/19/2023 Prepared by: Verta Ellen  Exercises - Seated Hamstring Stretch  - 2 x daily - 7 x weekly - 3 sets - 3 reps - 30 sec hold ASSESSMENT:  CLINICAL IMPRESSION: Patient is a 77 y.o. male who participated today with  skilled physical therapy to address sudden onset of L SI pain.  Reports overall improvement in Sx  still has pain and instability if turning or moving a certain way.responded well to strengthening L quads, hamstrings, hip musculature last visit so continued and progressed these ex again today. still awaiting results of L hip x ray.  Saw his referring MD who wishes him to continue 3 to 4 weeks.  Overall better function, pain levels. He would continue to benefit from skilled therapy to address these  deficits.      OBJECTIVE IMPAIRMENTS: Abnormal gait, decreased activity tolerance, decreased endurance, difficulty walking, decreased ROM, decreased strength, hypomobility, and pain.   ACTIVITY LIMITATIONS:  carrying, lifting, bending, squatting, sleeping, stairs, and locomotion level  PARTICIPATION LIMITATIONS: laundry, driving, shopping, community activity, and yard work  PERSONAL FACTORS: Age, Behavior pattern, Past/current experiences, Time since onset of injury/illness/exacerbation, and 1-2 comorbidities: h/o prostate ca, h/o rotator cuff repair  are also affecting patient's functional outcome.   REHAB POTENTIAL: Good  CLINICAL DECISION MAKING: Stable/uncomplicated  EVALUATION COMPLEXITY: Low   GOALS: Goals reviewed with patient? Yes  SHORT TERM GOALS: Target date: 03/18/23  I HEP Baseline: Goal status: MET   LONG TERM GOALS: Target date: 04/29/23  Modified oswestry decrease from 20/50 to 10/50 Baseline:  Goal status: INITIAL  2.  Improve L hip ROM for flexion to 115 and IR to 10 from 110 and 3 for impoved standing tolerance, gait mechanics Baseline:  Goal status: INITIAL  3.  Able to complete 6 min walk test without L thigh/post hip pain for improved function  Baseline: pain with all gait Goal status: INITIAL  4.  Improve L quads strength to 4+/5 for improved ability to descend hills, walk Baseline:  Goal status: INITIAL  PLAN:  PT FREQUENCY: 2x/week  PT DURATION: 8 weeks  PLANNED INTERVENTIONS: 97110-Therapeutic exercises, 97530- Therapeutic activity, [id number]- Neuromuscular re-education, 97535- Self Care, [id number]- Manual therapy, and [id number]- Gait training.  PLAN FOR NEXT SESSION: , L quads strengthening, L ant hip, thigh stretching, manual techniques, L hip strength   Chucky Homes L Woodroe Vogan, PT, DPT, OCS 03/30/2023, 11:07 AM

## 2023-03-30 NOTE — Progress Notes (Signed)
   Subjective:    Patient ID: Manuel Landry, male    DOB: July 26, 1945, 77 y.o.   MRN: [id number]  HPI chief complaint: Low back pain  Patient is a very pleasant 77 year old male that presents today with 1 month of left-sided low back pain.  His pain began acutely upon awakening 1 morning.  Along with the pain he noticed weakness in his left quad.  When his symptoms persisted he saw his PCP who prescribed both ibuprofen and a muscle relaxer.  He was also referred to physical therapy.  Overall he does feel like these things are helping but he is not back to his baseline.  He does have a history of degenerative disc disease but has not had any low back surgery.  His PCP ordered x-rays of his left hip which do show some advanced arthritis.  However, the patient's symptoms are most noticeable with sitting.  Past medical history reviewed Medications reviewed Allergies reviewed   Review of Systems As above    Objective:   Physical Exam  Well-developed, well-nourished.  No acute distress  Lumbar spine: Good lumbar flexion.  This does reproduce some left thigh pain.  Painless extension.  No tenderness to palpation along the lumbar midline.  No spasm.  There is some weakness with resisted left hip flexion on the left compared to the right.  Remainder of his strength is 5/5 in both lower extremities.  Reflexes are equal at the Achilles and patellar reflexes bilaterally.  Left hip: Limited internal rotation passively with only minimal groin pain.  This maneuver does not reproduce his overall symptoms.      Assessment & Plan:   Low back pain and left leg weakness likely secondary to degenerative disc disease  Since he is already improving with physical therapy, ibuprofen, and muscle relaxers, he will continue on his course.  I do think he should continue with physical therapy for another 3 to 4 weeks.  May also apply heat or ice, whichever 1 is more helpful.  He was also asking about a  compression garment for the hip and low back and I think that is fine for him to try.  If his symptoms persist he may need to see physical medicine and rehab for further workup.    This note was dictated using Dragon naturally speaking software and may contain errors in syntax, spelling, or content which have not been identified prior to signing this note.

## 2023-04-01 ENCOUNTER — Other Ambulatory Visit: Payer: Self-pay

## 2023-04-01 ENCOUNTER — Ambulatory Visit: Payer: Medicare Other

## 2023-04-01 DIAGNOSIS — M25652 Stiffness of left hip, not elsewhere classified: Secondary | ICD-10-CM | POA: Diagnosis not present

## 2023-04-01 DIAGNOSIS — R262 Difficulty in walking, not elsewhere classified: Secondary | ICD-10-CM

## 2023-04-01 DIAGNOSIS — M5432 Sciatica, left side: Secondary | ICD-10-CM

## 2023-04-01 DIAGNOSIS — M5386 Other specified dorsopathies, lumbar region: Secondary | ICD-10-CM

## 2023-04-01 NOTE — Therapy (Signed)
OUTPATIENT PHYSICAL THERAPY THORACOLUMBAR TREATMENT   Patient Name: Manuel Landry MRN: [id number] DOB:Jul 07, 1945, 77 y.o., male Today's Date: 04/01/2023  END OF SESSION:  PT End of Session - 04/01/23 1308     Visit Number 8    Progress Note Due on Visit 10    PT Start Time 0930    PT Stop Time 1015    PT Time Calculation (min) 45 min    Activity Tolerance Patient tolerated treatment well    Behavior During Therapy WFL for tasks assessed/performed               Past Medical History:  Diagnosis Date   Allergy    Anxiety    Arthritis    Blind loop syndrome    Bulging discs    LOWER BACK   Cataract    "beginnings of"   Diverticulosis    Family history of malignant neoplasm of gastrointestinal tract    History of adenomatous polyp of colon    History of Barrett's esophagus    History of chronic gastritis    Hyperlipidemia    no meds taken   IBS (irritable bowel syndrome)    Internal hemorrhoids    Malignant neoplasm of prostate [facility]) urologist-  dr dahlstedt/  oncologist-  dr Kathrynn Running   dx 08-09-2017-- Stage T2a, Gleason 4+5, PSA 3.22 (on finateride)--- treatment ADT and external beam radiation   PONV (postoperative nausea and vomiting)    Seasonal allergic rhinitis    Tubular adenoma of colon    Wears glasses    Past Surgical History:  Procedure Laterality Date   COLONOSCOPY  last one 10-21-2016  dr Rhea Belton   CYSTOSCOPY WITH INSERTION OF UROLIFT N/A 12/15/2017   Procedure: CYSTOSCOPY WITH INSERTION OF UROLIFT, BIOPSY  PROSTATIC URETHAL;  Surgeon: Marcine Matar, MD;  Location: [facility] [place];  Service: Urology;  Laterality: N/A;   ESOPHAGOGASTRODUODENOSCOPY (EGD) WITH PROPOFOL  last one 09-26-2013   dr pyrtle   INGUINAL HERNIA REPAIR Bilateral right 10-25-1997:  left 09-28-2008 (dr m. Daphine Deutscher @ [facility])   LAPAROSCOPIC CHOLECYSTECTOMY  2012   MANDIBLE SURGERY Bilateral 1990s   removal calcified bone growths   ROTATOR CUFF REPAIR Right 03-26-2009    dr Shelle Iron  [facility]   SHOULDER OPEN ROTATOR CUFF REPAIR  03/17/2012   Procedure: ROTATOR CUFF REPAIR SHOULDER OPEN;  Surgeon: Javier Docker, MD;  Location: [facility];  Service: Orthopedics;  Laterality: Left;  LEFT MINI OPEN ROTATOR CUFF REPAIR    SPACE OAR INSTILLATION N/A 12/15/2017   Procedure: SPACE OAR INSTILLATION;  Surgeon: Marcine Matar, MD;  Location: [facility];  Service: Urology;  Laterality: N/A;   TONSILLECTOMY  child   UPPER GASTROINTESTINAL ENDOSCOPY     Patient Active Problem List   Diagnosis Date Noted   Elevated blood pressure reading 06/11/2022   Seasonal allergies 03/03/2022   Chronic rhinitis 03/06/2021   Nocturnal muscle cramp 09/02/2020   GAD (generalized anxiety disorder) 09/02/2020   Closed nondisplaced fracture of body of left scapula 04/15/2020   Situational anxiety 12/27/2018   Trigger finger of left thumb 11/17/2018   Hyperlipidemia 01/27/2017   Cardiovascular risk factor 08/12/2011   S/P cholecystectomy 06/22/2011   History of colonic polyps 06/22/2011   FLATULENCE-GAS-BLOATING 02/13/2010   Calculus of gallbladder 02/11/2010   GERD 02/10/2010   Diverticulosis of colon 02/10/2010   Nausea 02/10/2010   ABDOMINAL PAIN RIGHT UPPER QUADRANT 02/10/2010   ABDOMINAL PAIN, EPIGASTRIC 02/10/2010   ANEMIA-IRON DEFICIENCY 07/17/2008   Inguinal  hernia 07/17/2008   IRRITABLE BOWEL SYNDROME 07/17/2008   Gastritis and gastroduodenitis 07/11/2008   Duodenitis 07/11/2008   RENAL CYST, RIGHT 07/11/2008   DYSPEPSIA 06/12/2008   Constipation 05/31/2008   Backache 05/31/2008   BENIGN PROSTATIC HYPERTROPHY, MILD, HX OF 05/31/2008    PCP: Arva Chafe , DO  REFERRING PROVIDER: same  REFERRING DIAG: L sciatic pain  Rationale for Evaluation and Treatment: Rehabilitation  THERAPY DIAG:  Sciatica, left side  Difficulty in walking, not elsewhere classified  Stiffness of left hip, not elsewhere classified  Decreased ROM of lumbar  spine  ONSET DATE: 3 weeks, Sept 28 2024  SUBJECTIVE:                                                                                                                                                                                           SUBJECTIVE STATEMENT: Feel like I am getting improvement overall, usually feel a lot better the day after PT.  PERTINENT HISTORY:  Patient has gone to his physician 2 x , taking ibuprofen and muscle relaxers without much relief.  Referred to PT for eval and treat  PAIN:  Are you having pain? Yes: NPRS scale: 8/10 Pain location: L ant thigh into L lat hip and L SI jt Pain description: cramp that wont let up Aggravating factors: trying to walk, walking dog  Relieving factors: ice, TENS, med, minimal relief  PRECAUTIONS: None  RED FLAGS: None   WEIGHT BEARING RESTRICTIONS: No  FALLS:  Has patient fallen in last 6 months? No  LIVING ENVIRONMENT: Lives with: lives with their spouse Lives in: House/apartment Stairs: No Has following equipment at home: Single point cane  OCCUPATION: retired  PLOF: Independent  PATIENT GOALS: get rid of pain, be able to walk dog  NEXT MD VISIT: unclear  OBJECTIVE:  Note: Objective measures were completed at Evaluation unless otherwise noted.  DIAGNOSTIC FINDINGS:  none  PATIENT SURVEYS:  Modified Oswestry 20/50   SCREENING FOR RED FLAGS: Bowel or bladder incontinence: No Spinal tumors: No Cauda equina syndrome: No Compression fracture: No Abdominal aneurysm: No  COGNITION: Overall cognitive status: Within functional limits for tasks assessed     SENSATION: Reports radicular pain L thigh, no numbness, neural Sx  MUSCLE LENGTH: Hamstrings: Right wfl deg; Left wfl deg [name] test: Right -5 deg; Left -15 deg  POSTURE: flexed trunk  and loss of lumbar lordosis and loss of gluteal mass B  PALPATION: Pt tender proximal L quads, L TFL prox attachment L glut medius and L glut maximus Pain with  PA segmental testing into L SI jt at L4/5 and L5/S1  LUMBAR ROM:  AROM eval  Flexion 70  Extension 50 P! L SI  Right lateral flexion 50  Left lateral flexion 50 p! L SI  Right rotation   Left rotation    (Blank rows = not tested)  LOWER EXTREMITY ROM:     Passive  Right eval Left eval  Hip flexion 125 110  Hip extension -5 -15  Hip abduction    Hip adduction    Hip internal rotation 12 3  Hip external rotation -20 -15  Knee flexion    Knee extension    Ankle dorsiflexion    Ankle plantarflexion    Ankle inversion    Ankle eversion     (Blank rows = wfl)  LOWER EXTREMITY MMT:    MMT Right eval Left eval  Hip flexion  P! 3-  Hip extension    Hip abduction  5  Hip adduction    Hip internal rotation    Hip external rotation  5  Knee flexion    Knee extension  4-/5 P!  Ankle dorsiflexion 4 4  Ankle plantarflexion 4 4  Ankle inversion    Ankle eversion     (Blank rows = wfl)  LUMBAR SPECIAL TESTS:  Straight leg raise test: Negative and FABER test: Negative  FUNCTIONAL TESTS:  30 sec sit to stand TBD   6 min walk test  unable today, antalgic/painful gait  GAIT: Distance walked: 14' Assistive device utilized: None Level of assistance: Complete Independence Comments: antalgic on L, decreased stance time on L Also noted L hip remains in flexed position to 15 degrees in supine  TODAY'S TREATMENT:                                                                                                                              DATE: 04/01/23: Manual :  Supine for mobilization with movement, lateral/ inf distraction of L femoral head, for L hip flexion, 3 bouts  Recumbent bicycle:   level 2 x 5 min BATCA Knee extension 15# B LE's up, L LE down( eccentric quads) BATCA B knee flexion, L LE up (eccentric hamstrings) 35#, 3 x15 Standing L hip ext 3# cuff wt , 20x  Standing L hip abd 3# cuff wt, 20x Standing L hip flexion 3# cuff wt 20x Leg press, 25#, B LE's 3 x  10   03/30/23: Manual :  Supine for mobilization with movement, lateral/ inf distraction of L femoral head, for L hip flexion, 3 bouts  Nustep: level 5 x 6 min BATCA Knee extension 15# B LE's up, L LE down( eccentric quads) BATCA B knee flexion, L LE up (ecdentric hamstrings) 35#, 3 x15 Standing L hip ext 3# cuff wt , 20x  Standing L hip abd 3# cuff wt, 20x Standing L hip flexion 3# cuff wt 20x Leg press, 25#, B LE's 3 x 10     03/25/23:  Manual :  Supine for mobilization with movement, lateral/ inf distraction  of L femoral head, for L hip flexion/IR, ER 3 bouts  R side lying for deep cross friction massage L lateral hip musculature, with theragun Therex:  Nustep: level 5 x 6 min BATCA Knee extension 15# B LE's up, L LE down( eccentric quads) BATCA B knee flexion, L LE up (ecdentric hamstrings) 35#, 3 x15 Standing L hip ext 3# cuff wt , 20x  Standing L hip abd 3# cuff wt, 20x  03/23/23:  Therex:  Nustep level 5, LE's only for 5 min BATCA Knee extension 15# B LE's up, L LE down( eccentric quads) BATCA B knee flexion, L LE up (ecdentric hamstrings) 35#, 3 x15 Standing L hip ext with 2 # 10x  Manual:  performed after Nustep  Supine for mobilization with movement, L hip for flexion, IR , 3 bout each R side lying for deep cross friction massage L lateral hip musculature, with theragun  03/19/23 Nustep L5x33min LE only Deadlift with 1 UE support x 10 Step ups 6' x 10 BLE Lateral step ups 6'x 10 BLE Standing straight leg hip flexion x 10 BLE IASTM with massage gun to L proximal HS and glutes Seated L hamstring stretch 2x15"  03/16/23: Manual:supine for mobilization with movement for L hip flexibility, focused on L hip flexion: 2 sets 15 reps , with lateral glide L femoral  head throughout movement  R side lying for deep massage with theragun, focused primarily on piriformis and post hip capsule  Therex: supine for manually resisted L hip and knee extension from 90 degrees  flexed position, 10 reps Supine bridging , thighs over therapeutic ball Supine B knee to chest, heels on ball Supine LTR thighs on ball    03/10/23 Therapeutic Exercise: to improve strength and mobility.  Demo, verbal and tactile cues throughout for technique.  Bike L2x22min L thomas stretch 2x30 sec Supine hip ADD with ball 10x5" Supine hip ABD isometric 5x5"  Manual Therapy: to decrease muscle spasm, pain and improve mobility.  Manual L hip distraction in supine S/L R hip flexor stretch 3x prolonged hold Hip inferior glide with small oscillations  03/04/23: Manual:  Mob with movement for L hip flexion, 3 bouts 12 reps, utilized for assessment purposes and pt had improved pain and movement L hip so incorporated into treatment , lateral distraction of L femur with flexion.    Also R side lying with pillow between legs , cross friction massage L lateral hip musculature,  TFL, glut med  Instructed pt in seated hip abd with green t band, 15 reps    PATIENT EDUCATION:  Education details: HEP- hamstring stretch Person educated: Patient Education method: Explanation, Demonstration, and Tactile cues Education comprehension: verbalized understanding, returned demonstration, and verbal cues required  HOME EXERCISE PROGRAM: Provided written directions for the theraband hip abd, advised to perform 2 x day x 15 reps   Access Code: [email or web address] URL: [email or web address] Date: 03/19/2023 Prepared by: Verta Ellen  Exercises - Seated Hamstring Stretch  - 2 x daily - 7 x weekly - 3 sets - 3 reps - 30 sec hold ASSESSMENT:  CLINICAL IMPRESSION: Patient is a 77 y.o. male who participated today with  skilled physical therapy to address sudden onset of L SI pain.  Reports overall improvement in Sx overall,likes the strengthening ex , did not adapt strengthening much today as he is responding well so far. Overall better function.  He would continue to benefit from skilled therapy  to address these deficits.Will reassess next week.  OBJECTIVE IMPAIRMENTS: Abnormal gait, decreased activity tolerance, decreased endurance, difficulty walking, decreased ROM, decreased strength, hypomobility, and pain.   ACTIVITY LIMITATIONS: carrying, lifting, bending, squatting, sleeping, stairs, and locomotion level  PARTICIPATION LIMITATIONS: laundry, driving, shopping, community activity, and yard work  PERSONAL FACTORS: Age, Behavior pattern, Past/current experiences, Time since onset of injury/illness/exacerbation, and 1-2 comorbidities: h/o prostate ca, h/o rotator cuff repair  are also affecting patient's functional outcome.   REHAB POTENTIAL: Good  CLINICAL DECISION MAKING: Stable/uncomplicated  EVALUATION COMPLEXITY: Low   GOALS: Goals reviewed with patient? Yes  SHORT TERM GOALS: Target date: 03/18/23  I HEP Baseline: Goal status: MET   LONG TERM GOALS: Target date: 04/29/23  Modified oswestry decrease from 20/50 to 10/50 Baseline:  Goal status: INITIAL  2.  Improve L hip ROM for flexion to 115 and IR to 10 from 110 and 3 for impoved standing tolerance, gait mechanics Baseline:  Goal status: INITIAL  3.  Able to complete 6 min walk test without L thigh/post hip pain for improved function  Baseline: pain with all gait Goal status: INITIAL  4.  Improve L quads strength to 4+/5 for improved ability to descend hills, walk Baseline:  Goal status: INITIAL  PLAN:  PT FREQUENCY: 2x/week  PT DURATION: 8 weeks  PLANNED INTERVENTIONS: 97110-Therapeutic exercises, 97530- Therapeutic activity, [id number]- Neuromuscular re-education, 97535- Self Care, [id number]- Manual therapy, and [id number]- Gait training.  PLAN FOR NEXT SESSION: , L quads strengthening, L ant hip, thigh stretching, manual techniques, L hip strength   Sariya Trickey L Shamere Campas, PT, DPT, OCS 04/01/2023, 4:21 PM

## 2023-04-06 ENCOUNTER — Ambulatory Visit: Payer: Medicare Other

## 2023-04-06 DIAGNOSIS — M25652 Stiffness of left hip, not elsewhere classified: Secondary | ICD-10-CM | POA: Diagnosis not present

## 2023-04-06 DIAGNOSIS — R262 Difficulty in walking, not elsewhere classified: Secondary | ICD-10-CM

## 2023-04-06 DIAGNOSIS — M5432 Sciatica, left side: Secondary | ICD-10-CM | POA: Diagnosis not present

## 2023-04-06 DIAGNOSIS — M5386 Other specified dorsopathies, lumbar region: Secondary | ICD-10-CM | POA: Diagnosis not present

## 2023-04-06 NOTE — Therapy (Signed)
OUTPATIENT PHYSICAL THERAPY THORACOLUMBAR TREATMENT   Patient Name: Manuel Landry MRN: [id number] DOB:[birth year], 77 y.o., male Today's Date: 04/06/2023  END OF SESSION:  PT End of Session - 04/06/23 1148     Visit Number 9    Date for PT Re-Evaluation 04/29/23    Progress Note Due on Visit 10    PT Start Time 1107    PT Stop Time 1147    PT Time Calculation (min) 40 min    Activity Tolerance Patient tolerated treatment well    Behavior During Therapy WFL for tasks assessed/performed                Past Medical History:  Diagnosis Date   Allergy    Anxiety    Arthritis    Blind loop syndrome    Bulging discs    LOWER BACK   Cataract    "beginnings of"   Diverticulosis    Family history of malignant neoplasm of gastrointestinal tract    History of adenomatous polyp of colon    History of Barrett's esophagus    History of chronic gastritis    Hyperlipidemia    no meds taken   IBS (irritable bowel syndrome)    Internal hemorrhoids    Malignant neoplasm of prostate [facility]) urologist-  dr dahlstedt/  oncologist-  dr Kathrynn Running   dx 08-09-2017-- Stage T2a, Gleason 4+5, PSA 3.22 (on finateride)--- treatment ADT and external beam radiation   PONV (postoperative nausea and vomiting)    Seasonal allergic rhinitis    Tubular adenoma of colon    Wears glasses    Past Surgical History:  Procedure Laterality Date   COLONOSCOPY  last one 10-21-2016  dr Rhea Belton   CYSTOSCOPY WITH INSERTION OF UROLIFT N/A 12/15/2017   Procedure: CYSTOSCOPY WITH INSERTION OF UROLIFT, BIOPSY  PROSTATIC URETHAL;  Surgeon: Marcine Matar, MD;  Location: [facility];  Service: Urology;  Laterality: N/A;   ESOPHAGOGASTRODUODENOSCOPY (EGD) WITH PROPOFOL  last one 09-26-2013   dr pyrtle   INGUINAL HERNIA REPAIR Bilateral right 10-25-1997:  left 09-28-2008 (dr m. Daphine Deutscher @ [facility])   LAPAROSCOPIC CHOLECYSTECTOMY  2012   MANDIBLE SURGERY Bilateral 1990s   removal calcified bone growths    ROTATOR CUFF REPAIR Right 03-26-2009   dr Shelle Iron  [facility]   SHOULDER OPEN ROTATOR CUFF REPAIR  03/17/2012   Procedure: ROTATOR CUFF REPAIR SHOULDER OPEN;  Surgeon: Javier Docker, MD;  Location: [facility];  Service: Orthopedics;  Laterality: Left;  LEFT MINI OPEN ROTATOR CUFF REPAIR    SPACE OAR INSTILLATION N/A 12/15/2017   Procedure: SPACE OAR INSTILLATION;  Surgeon: Marcine Matar, MD;  Location: [facility];  Service: Urology;  Laterality: N/A;   TONSILLECTOMY  child   UPPER GASTROINTESTINAL ENDOSCOPY     Patient Active Problem List   Diagnosis Date Noted   Elevated blood pressure reading 06/11/2022   Seasonal allergies 03/03/2022   Chronic rhinitis 03/06/2021   Nocturnal muscle cramp 09/02/2020   GAD (generalized anxiety disorder) 09/02/2020   Closed nondisplaced fracture of body of left scapula 04/15/2020   Situational anxiety 12/27/2018   Trigger finger of left thumb 11/17/2018   Hyperlipidemia 01/27/2017   Cardiovascular risk factor 08/12/2011   S/P cholecystectomy 06/22/2011   History of colonic polyps 06/22/2011   FLATULENCE-GAS-BLOATING 02/13/2010   Calculus of gallbladder 02/11/2010   GERD 02/10/2010   Diverticulosis of colon 02/10/2010   Nausea 02/10/2010   ABDOMINAL PAIN RIGHT UPPER QUADRANT 02/10/2010   ABDOMINAL PAIN, EPIGASTRIC  02/10/2010   ANEMIA-IRON DEFICIENCY 07/17/2008   Inguinal hernia 07/17/2008   IRRITABLE BOWEL SYNDROME 07/17/2008   Gastritis and gastroduodenitis 07/11/2008   Duodenitis 07/11/2008   RENAL CYST, RIGHT 07/11/2008   DYSPEPSIA 06/12/2008   Constipation 05/31/2008   Backache 05/31/2008   BENIGN PROSTATIC HYPERTROPHY, MILD, HX OF 05/31/2008    PCP: Arva Chafe , DO  REFERRING PROVIDER: same  REFERRING DIAG: L sciatic pain  Rationale for Evaluation and Treatment: Rehabilitation  THERAPY DIAG:  Sciatica, left side  Difficulty in walking, not elsewhere classified  Stiffness of left hip, not elsewhere  classified  Decreased ROM of lumbar spine  ONSET DATE: 3 weeks, Sept 28 2024  SUBJECTIVE:                                                                                                                                                                                           SUBJECTIVE STATEMENT: Today is not that great. PERTINENT HISTORY:  Patient has gone to his physician 2 x , taking ibuprofen and muscle relaxers without much relief.  Referred to PT for eval and treat  PAIN:  Are you having pain? Yes: NPRS scale: 5/10 Pain location: L ant thigh into L lat hip and L SI jt Pain description: cramp that wont let up Aggravating factors: trying to walk, walking dog  Relieving factors: ice, TENS, med, minimal relief  PRECAUTIONS: None  RED FLAGS: None   WEIGHT BEARING RESTRICTIONS: No  FALLS:  Has patient fallen in last 6 months? No  LIVING ENVIRONMENT: Lives with: lives with their spouse Lives in: House/apartment Stairs: No Has following equipment at home: Single point cane  OCCUPATION: retired  PLOF: Independent  PATIENT GOALS: get rid of pain, be able to walk dog  NEXT MD VISIT: unclear  OBJECTIVE:  Note: Objective measures were completed at Evaluation unless otherwise noted.  DIAGNOSTIC FINDINGS:  none  PATIENT SURVEYS:  Modified Oswestry 20/50   SCREENING FOR RED FLAGS: Bowel or bladder incontinence: No Spinal tumors: No Cauda equina syndrome: No Compression fracture: No Abdominal aneurysm: No  COGNITION: Overall cognitive status: Within functional limits for tasks assessed     SENSATION: Reports radicular pain L thigh, no numbness, neural Sx  MUSCLE LENGTH: Hamstrings: Right wfl deg; Left wfl deg [name] test: Right -5 deg; Left -15 deg  POSTURE: flexed trunk  and loss of lumbar lordosis and loss of gluteal mass B  PALPATION: Pt tender proximal L quads, L TFL prox attachment L glut medius and L glut maximus Pain with PA segmental testing into  L SI jt at L4/5 and L5/S1  LUMBAR ROM:   AROM  eval  Flexion 70  Extension 50 P! L SI  Right lateral flexion 50  Left lateral flexion 50 p! L SI  Right rotation   Left rotation    (Blank rows = not tested)  LOWER EXTREMITY ROM:     Passive  Right eval Left eval  Hip flexion 125 110  Hip extension -5 -15  Hip abduction    Hip adduction    Hip internal rotation 12 3  Hip external rotation -20 -15  Knee flexion    Knee extension    Ankle dorsiflexion    Ankle plantarflexion    Ankle inversion    Ankle eversion     (Blank rows = wfl)  LOWER EXTREMITY MMT:    MMT Right eval Left eval  Hip flexion  P! 3-  Hip extension    Hip abduction  5  Hip adduction    Hip internal rotation    Hip external rotation  5  Knee flexion    Knee extension  4-/5 P!  Ankle dorsiflexion 4 4  Ankle plantarflexion 4 4  Ankle inversion    Ankle eversion     (Blank rows = wfl)  LUMBAR SPECIAL TESTS:  Straight leg raise test: Negative and FABER test: Negative  FUNCTIONAL TESTS:  30 sec sit to stand TBD   6 min walk test  unable today, antalgic/painful gait  GAIT: Distance walked: 32' Assistive device utilized: None Level of assistance: Complete Independence Comments: antalgic on L, decreased stance time on L Also noted L hip remains in flexed position to 15 degrees in supine  TODAY'S TREATMENT:                                                                                                                              DATE: 04/06/23 Manual Therapy: Supine L PROM hip each movement  Long leg distraction LLE STM to L hip flexors and quads TherEx: LTR both ways x 10  Femoral nerve glide 2 x 10 / Prone knee bend Nustep L5x61min LE only Step ups 8' x 10 BLE Lateral step ups 8' x 10 BLE Standing marching x 6 BLE on UE support  04/01/23: Manual :  Supine for mobilization with movement, lateral/ inf distraction of L femoral head, for L hip flexion, 3 bouts  Recumbent bicycle:    level 2 x 5 min BATCA Knee extension 15# B LE's up, L LE down( eccentric quads) BATCA B knee flexion, L LE up (eccentric hamstrings) 35#, 3 x15 Standing L hip ext 3# cuff wt , 20x  Standing L hip abd 3# cuff wt, 20x Standing L hip flexion 3# cuff wt 20x Leg press, 25#, B LE's 3 x 10   03/30/23: Manual :  Supine for mobilization with movement, lateral/ inf distraction of L femoral head, for L hip flexion, 3 bouts  Nustep: level 5 x 6 min BATCA Knee extension 15# B LE's up, L LE down( eccentric quads)  BATCA B knee flexion, L LE up (ecdentric hamstrings) 35#, 3 x15 Standing L hip ext 3# cuff wt , 20x  Standing L hip abd 3# cuff wt, 20x Standing L hip flexion 3# cuff wt 20x Leg press, 25#, B LE's 3 x 10     03/25/23:  Manual :  Supine for mobilization with movement, lateral/ inf distraction of L femoral head, for L hip flexion/IR, ER 3 bouts  R side lying for deep cross friction massage L lateral hip musculature, with theragun Therex:  Nustep: level 5 x 6 min BATCA Knee extension 15# B LE's up, L LE down( eccentric quads) BATCA B knee flexion, L LE up (ecdentric hamstrings) 35#, 3 x15 Standing L hip ext 3# cuff wt , 20x  Standing L hip abd 3# cuff wt, 20x  03/23/23:  Therex:  Nustep level 5, LE's only for 5 min BATCA Knee extension 15# B LE's up, L LE down( eccentric quads) BATCA B knee flexion, L LE up (ecdentric hamstrings) 35#, 3 x15 Standing L hip ext with 2 # 10x  Manual:  performed after Nustep  Supine for mobilization with movement, L hip for flexion, IR , 3 bout each R side lying for deep cross friction massage L lateral hip musculature, with theragun  03/19/23 Nustep L5x26min LE only Deadlift with 1 UE support x 10 Step ups 6' x 10 BLE Lateral step ups 6'x 10 BLE Standing straight leg hip flexion x 10 BLE IASTM with massage gun to L proximal HS and glutes Seated L hamstring stretch 2x15"  03/16/23: Manual:supine for mobilization with movement for L hip  flexibility, focused on L hip flexion: 2 sets 15 reps , with lateral glide L femoral  head throughout movement  R side lying for deep massage with theragun, focused primarily on piriformis and post hip capsule  Therex: supine for manually resisted L hip and knee extension from 90 degrees flexed position, 10 reps Supine bridging , thighs over therapeutic ball Supine B knee to chest, heels on ball Supine LTR thighs on ball    03/10/23 Therapeutic Exercise: to improve strength and mobility.  Demo, verbal and tactile cues throughout for technique.  Bike L2x105min L thomas stretch 2x30 sec Supine hip ADD with ball 10x5" Supine hip ABD isometric 5x5"  Manual Therapy: to decrease muscle spasm, pain and improve mobility.  Manual L hip distraction in supine S/L R hip flexor stretch 3x prolonged hold Hip inferior glide with small oscillations  03/04/23: Manual:  Mob with movement for L hip flexion, 3 bouts 12 reps, utilized for assessment purposes and pt had improved pain and movement L hip so incorporated into treatment , lateral distraction of L femur with flexion.    Also R side lying with pillow between legs , cross friction massage L lateral hip musculature,  TFL, glut med  Instructed pt in seated hip abd with green t band, 15 reps    PATIENT EDUCATION:  Education details: HEP- hamstring stretch Person educated: Patient Education method: Explanation, Demonstration, and Tactile cues Education comprehension: verbalized understanding, returned demonstration, and verbal cues required  HOME EXERCISE PROGRAM: Provided written directions for the theraband hip abd, advised to perform 2 x day x 15 reps   Access Code: [email or web address] URL: [email or web address] Date: 03/19/2023 Prepared by: Verta Ellen  Exercises - Seated Hamstring Stretch  - 2 x daily - 7 x weekly - 3 sets - 3 reps - 30 sec hold ASSESSMENT:  CLINICAL IMPRESSION: Patient is a 77 y.o. male  who participated  today with  skilled physical therapy to address sudden onset of L SI pain.  He was flared up today for no known MOI. Worked on manual techniques for pain relief and to improve mobility along with TE. Pt continues to show antalgic gait d/t LLE weakness. Progressed with WB strengthening for B LE with cuing. He would continue to benefit from skilled therapy to address these deficits.   OBJECTIVE IMPAIRMENTS: Abnormal gait, decreased activity tolerance, decreased endurance, difficulty walking, decreased ROM, decreased strength, hypomobility, and pain.   ACTIVITY LIMITATIONS: carrying, lifting, bending, squatting, sleeping, stairs, and locomotion level  PARTICIPATION LIMITATIONS: laundry, driving, shopping, community activity, and yard work  PERSONAL FACTORS: Age, Behavior pattern, Past/current experiences, Time since onset of injury/illness/exacerbation, and 1-2 comorbidities: h/o prostate ca, h/o rotator cuff repair  are also affecting patient's functional outcome.   REHAB POTENTIAL: Good  CLINICAL DECISION MAKING: Stable/uncomplicated  EVALUATION COMPLEXITY: Low   GOALS: Goals reviewed with patient? Yes  SHORT TERM GOALS: Target date: 03/18/23  I HEP Baseline: Goal status: MET   LONG TERM GOALS: Target date: 04/29/23  Modified oswestry decrease from 20/50 to 10/50 Baseline:  Goal status: INITIAL  2.  Improve L hip ROM for flexion to 115 and IR to 10 from 110 and 3 for impoved standing tolerance, gait mechanics Baseline:  Goal status: INITIAL  3.  Able to complete 6 min walk test without L thigh/post hip pain for improved function  Baseline: pain with all gait Goal status: INITIAL  4.  Improve L quads strength to 4+/5 for improved ability to descend hills, walk Baseline:  Goal status: INITIAL  PLAN:  PT FREQUENCY: 2x/week  PT DURATION: 8 weeks  PLANNED INTERVENTIONS: 97110-Therapeutic exercises, 97530- Therapeutic activity, [id number]- Neuromuscular re-education, 97535-  Self Care, [id number]- Manual therapy, and [id number]- Gait training.  PLAN FOR NEXT SESSION: , L quads strengthening, L ant hip, thigh stretching, manual techniques, L hip strength   Darleene Cleaver, PTA 04/06/2023, 11:49 AM

## 2023-04-09 ENCOUNTER — Encounter (HOSPITAL_BASED_OUTPATIENT_CLINIC_OR_DEPARTMENT_OTHER): Payer: Self-pay | Admitting: Pharmacist

## 2023-04-09 ENCOUNTER — Other Ambulatory Visit (HOSPITAL_BASED_OUTPATIENT_CLINIC_OR_DEPARTMENT_OTHER): Payer: Self-pay

## 2023-04-09 ENCOUNTER — Ambulatory Visit: Payer: Medicare Other

## 2023-04-09 DIAGNOSIS — M5432 Sciatica, left side: Secondary | ICD-10-CM

## 2023-04-09 DIAGNOSIS — M25652 Stiffness of left hip, not elsewhere classified: Secondary | ICD-10-CM

## 2023-04-09 DIAGNOSIS — M5386 Other specified dorsopathies, lumbar region: Secondary | ICD-10-CM | POA: Diagnosis not present

## 2023-04-09 DIAGNOSIS — R262 Difficulty in walking, not elsewhere classified: Secondary | ICD-10-CM

## 2023-04-09 MED FILL — Sertraline HCl Tab 50 MG: ORAL | 90 days supply | Qty: 90 | Fill #0 | Status: AC

## 2023-04-09 NOTE — Therapy (Addendum)
OUTPATIENT PHYSICAL THERAPY THORACOLUMBAR TREATMENT  Progress Note  Reporting Period 03/04/2023 to 04/09/2023   See note below for Objective Data and Assessment of Progress/Goals.     Patient Name: Manuel Landry MRN: [id number] DOB:[birth year], 77 y.o., male Today's Date: 04/09/2023  END OF SESSION:  PT End of Session - 04/09/23 1155     Visit Number 10    Date for PT Re-Evaluation 04/29/23    Progress Note Due on Visit 10    PT Start Time 1105    PT Stop Time 1148    PT Time Calculation (min) 43 min    Activity Tolerance Patient tolerated treatment well    Behavior During Therapy WFL for tasks assessed/performed                 Past Medical History:  Diagnosis Date   Allergy    Anxiety    Arthritis    Blind loop syndrome    Bulging discs    LOWER BACK   Cataract    "beginnings of"   Diverticulosis    Family history of malignant neoplasm of gastrointestinal tract    History of adenomatous polyp of colon    History of Barrett's esophagus    History of chronic gastritis    Hyperlipidemia    no meds taken   IBS (irritable bowel syndrome)    Internal hemorrhoids    Malignant neoplasm of prostate [facility]) urologist-  dr dahlstedt/  oncologist-  dr Kathrynn Running   dx 08-09-2017-- Stage T2a, Gleason 4+5, PSA 3.22 (on finateride)--- treatment ADT and external beam radiation   PONV (postoperative nausea and vomiting)    Seasonal allergic rhinitis    Tubular adenoma of colon    Wears glasses    Past Surgical History:  Procedure Laterality Date   COLONOSCOPY  last one 10-21-2016  dr Rhea Belton   CYSTOSCOPY WITH INSERTION OF UROLIFT N/A 12/15/2017   Procedure: CYSTOSCOPY WITH INSERTION OF UROLIFT, BIOPSY  PROSTATIC URETHAL;  Surgeon: Marcine Matar, MD;  Location: [facility];  Service: Urology;  Laterality: N/A;   ESOPHAGOGASTRODUODENOSCOPY (EGD) WITH PROPOFOL  last one 09-26-2013   dr pyrtle   INGUINAL HERNIA REPAIR Bilateral right 10-25-1997:  left  09-28-2008 (dr m. Daphine Deutscher @ [facility])   LAPAROSCOPIC CHOLECYSTECTOMY  2012   MANDIBLE SURGERY Bilateral 1990s   removal calcified bone growths   ROTATOR CUFF REPAIR Right 03-26-2009   dr Shelle Iron  [facility]   SHOULDER OPEN ROTATOR CUFF REPAIR  03/17/2012   Procedure: ROTATOR CUFF REPAIR SHOULDER OPEN;  Surgeon: Javier Docker, MD;  Location: [facility];  Service: Orthopedics;  Laterality: Left;  LEFT MINI OPEN ROTATOR CUFF REPAIR    SPACE OAR INSTILLATION N/A 12/15/2017   Procedure: SPACE OAR INSTILLATION;  Surgeon: Marcine Matar, MD;  Location: [facility];  Service: Urology;  Laterality: N/A;   TONSILLECTOMY  child   UPPER GASTROINTESTINAL ENDOSCOPY     Patient Active Problem List   Diagnosis Date Noted   Elevated blood pressure reading 06/11/2022   Seasonal allergies 03/03/2022   Chronic rhinitis 03/06/2021   Nocturnal muscle cramp 09/02/2020   GAD (generalized anxiety disorder) 09/02/2020   Closed nondisplaced fracture of body of left scapula 04/15/2020   Situational anxiety 12/27/2018   Trigger finger of left thumb 11/17/2018   Hyperlipidemia 01/27/2017   Cardiovascular risk factor 08/12/2011   S/P cholecystectomy 06/22/2011   History of colonic polyps 06/22/2011   FLATULENCE-GAS-BLOATING 02/13/2010   Calculus of gallbladder 02/11/2010   GERD  02/10/2010   Diverticulosis of colon 02/10/2010   Nausea 02/10/2010   ABDOMINAL PAIN RIGHT UPPER QUADRANT 02/10/2010   ABDOMINAL PAIN, EPIGASTRIC 02/10/2010   ANEMIA-IRON DEFICIENCY 07/17/2008   Inguinal hernia 07/17/2008   IRRITABLE BOWEL SYNDROME 07/17/2008   Gastritis and gastroduodenitis 07/11/2008   Duodenitis 07/11/2008   RENAL CYST, RIGHT 07/11/2008   DYSPEPSIA 06/12/2008   Constipation 05/31/2008   Backache 05/31/2008   BENIGN PROSTATIC HYPERTROPHY, MILD, HX OF 05/31/2008    PCP: Arva Chafe , DO  REFERRING PROVIDER: same  REFERRING DIAG: L sciatic pain  Rationale for Evaluation and Treatment:  Rehabilitation  THERAPY DIAG:  Sciatica, left side  Difficulty in walking, not elsewhere classified  Stiffness of left hip, not elsewhere classified  Decreased ROM of lumbar spine  ONSET DATE: 3 weeks, Sept 28 2024  SUBJECTIVE:                                                                                                                                                                                           SUBJECTIVE STATEMENT: Pt doing better today, he still has trouble walking his dogs w/o pain. PERTINENT HISTORY:  Patient has gone to his physician 2 x , taking ibuprofen and muscle relaxers without much relief.  Referred to PT for eval and treat  PAIN:  Are you having pain? Yes: NPRS scale: 0/10 Pain location: L ant thigh into L lat hip and L SI jt Pain description: cramp that wont let up Aggravating factors: trying to walk, walking dog  Relieving factors: ice, TENS, med, minimal relief  PRECAUTIONS: None  RED FLAGS: None   WEIGHT BEARING RESTRICTIONS: No  FALLS:  Has patient fallen in last 6 months? No  LIVING ENVIRONMENT: Lives with: lives with their spouse Lives in: House/apartment Stairs: No Has following equipment at home: Single point cane  OCCUPATION: retired  PLOF: Independent  PATIENT GOALS: get rid of pain, be able to walk dog  NEXT MD VISIT: unclear  OBJECTIVE:  Note: Objective measures were completed at Evaluation unless otherwise noted.  DIAGNOSTIC FINDINGS:  none  PATIENT SURVEYS:  Modified Oswestry 20/50   SCREENING FOR RED FLAGS: Bowel or bladder incontinence: No Spinal tumors: No Cauda equina syndrome: No Compression fracture: No Abdominal aneurysm: No  COGNITION: Overall cognitive status: Within functional limits for tasks assessed     SENSATION: Reports radicular pain L thigh, no numbness, neural Sx  MUSCLE LENGTH: Hamstrings: Right wfl deg; Left wfl deg [name] test: Right -5 deg; Left -15 deg  POSTURE: flexed trunk   and loss of lumbar lordosis and loss of gluteal mass B  PALPATION: Pt tender proximal  L quads, L TFL prox attachment L glut medius and L glut maximus Pain with PA segmental testing into L SI jt at L4/5 and L5/S1  LUMBAR ROM:   AROM eval  Flexion 70  Extension 50 P! L SI  Right lateral flexion 50  Left lateral flexion 50 p! L SI  Right rotation   Left rotation    (Blank rows = not tested)  LOWER EXTREMITY ROM:     Passive  Right eval Left eval L 04/09/23  Hip flexion 125 110 105  Hip extension -5 -15   Hip abduction     Hip adduction     Hip internal rotation 12 3 20   Hip external rotation -20 -15   Knee flexion     Knee extension     Ankle dorsiflexion     Ankle plantarflexion     Ankle inversion     Ankle eversion      (Blank rows = wfl)  LOWER EXTREMITY MMT:    MMT Right eval Left eval L 04/09/23  Hip flexion  P! 3-   Hip extension     Hip abduction  5   Hip adduction     Hip internal rotation     Hip external rotation  5   Knee flexion     Knee extension  4-/5 P! 4/5  no pain  Ankle dorsiflexion 4 4   Ankle plantarflexion 4 4   Ankle inversion     Ankle eversion      (Blank rows = wfl)  LUMBAR SPECIAL TESTS:  Straight leg raise test: Negative and FABER test: Negative  FUNCTIONAL TESTS:  30 sec sit to stand TBD   6 min walk test  unable today, antalgic/painful gait  GAIT: Distance walked: 44' Assistive device utilized: None Level of assistance: Complete Independence Comments: antalgic on L, decreased stance time on L Also noted L hip remains in flexed position to 15 degrees in supine  TODAY'S TREATMENT:                                                                                                                              DATE:  04/09/23 Therapeutic Exercise: to improve strength and mobility.  Demo, verbal and tactile cues throughout for technique.  Bike L3x70min Fitter press BLE x 10  Side steps RTB at ankles 2x43ft Monster walk RTB  at ankles 2x18ft Therapeutic Activity: L hip AROM and quad strength assessed : 1,125 ft no rest, no hip pain just tight on anterior thigh  04/06/23 Manual Therapy: Supine L PROM hip each movement  Long leg distraction LLE STM to L hip flexors and quads TherEx: LTR both ways x 10  Femoral nerve glide 2 x 10 / Prone knee bend Nustep L5x25min LE only Step ups 8' x 10 BLE Lateral step ups 8' x 10 BLE Standing marching x 6 BLE on UE support  04/01/23: Manual :  Supine for mobilization with  movement, lateral/ inf distraction of L femoral head, for L hip flexion, 3 bouts  Recumbent bicycle:   level 2 x 5 min BATCA Knee extension 15# B LE's up, L LE down( eccentric quads) BATCA B knee flexion, L LE up (eccentric hamstrings) 35#, 3 x15 Standing L hip ext 3# cuff wt , 20x  Standing L hip abd 3# cuff wt, 20x Standing L hip flexion 3# cuff wt 20x Leg press, 25#, B LE's 3 x 10   PATIENT EDUCATION:  Education details: HEP- hamstring stretch Person educated: Patient Education method: Explanation, Demonstration, and Tactile cues Education comprehension: verbalized understanding, returned demonstration, and verbal cues required  HOME EXERCISE PROGRAM: Provided written directions for the theraband hip abd, advised to perform 2 x day x 15 reps   Access Code: [email or web address] URL: [email or web address] Date: 03/19/2023 Prepared by: Verta Ellen  Exercises - Seated Hamstring Stretch  - 2 x daily - 7 x weekly - 3 sets - 3 reps - 30 sec hold   ASSESSMENT:  CLINICAL IMPRESSION: Patient is a 77 y.o. male who participated today with skilled physical therapy to address referring diagnosis of sudden onset of L SI pain.  He is progressing very well, meeting goals for 6 min walk test with no requirements for rest or pain and partially meeting goal for L hip ROM (IR). His L quads are improving with strength as well. He continues to report having L hip pain when walking his dogs on  uneven terrain being his biggest limitation along with transitional sit to stand movements reporting L hip pain. He continues to benefit from skilled PT to address ongoing impairments and further improve function.  OBJECTIVE IMPAIRMENTS: Abnormal gait, decreased activity tolerance, decreased endurance, difficulty walking, decreased ROM, decreased strength, hypomobility, and pain.   ACTIVITY LIMITATIONS: carrying, lifting, bending, squatting, sleeping, stairs, and locomotion level  PARTICIPATION LIMITATIONS: laundry, driving, shopping, community activity, and yard work  PERSONAL FACTORS: Age, Behavior pattern, Past/current experiences, Time since onset of injury/illness/exacerbation, and 1-2 comorbidities: h/o prostate ca, h/o rotator cuff repair  are also affecting patient's functional outcome.   REHAB POTENTIAL: Good  CLINICAL DECISION MAKING: Stable/uncomplicated  EVALUATION COMPLEXITY: Low   GOALS: Goals reviewed with patient? Yes  SHORT TERM GOALS: Target date: 03/18/23  I HEP Baseline: Goal status: MET   LONG TERM GOALS: Target date: 04/29/23  Modified oswestry decrease from 20/50 to 10/50 Baseline:  Goal status: PROGRESSING  2.  Improve L hip ROM for flexion to 115 and IR to 10 from 110 and 3 for impoved standing tolerance, gait mechanics Baseline:  Goal status: - met for IR (04/09/23) progressing with flexion  3.  Able to complete 6 min walk test without L thigh/post hip pain for improved function  Baseline: pain with all gait Goal status: MET- 04/09/23  4.  Improve L quads strength to 4+/5 for improved ability to descend hills, walk Baseline:  Goal status: PROGRESSING- 04/09/23  PLAN:  PT FREQUENCY: 2x/week  PT DURATION: 8 weeks  PLANNED INTERVENTIONS: 97110-Therapeutic exercises, 97530- Therapeutic activity, 97112- Neuromuscular re-education, 97535- Self Care, [id number]- Manual therapy, and 97116- Gait training.  PLAN FOR NEXT SESSION: need to do Modified  Oswestry; L quads strengthening, L ant hip, thigh stretching, manual techniques, L hip strength   Manuel Landry, PTA 04/09/2023, 11:56 AM    Manuel Landry is demonstrating good progress with PT for acute L SIJ pain.  He now has no difficulty walking on level surfaces w/o pain, although still has some  limitations due to increased pain when walking his dogs on uneven surfaces as well as during sit to stand transitions.  His L hip ROM is improving in IR but remains restricted in flexion.  Manuel "[name]" is progressing toward his goals and will benefit from continued skilled PT to address above deficits to improve mobility and activity tolerance with decreased pain interference.   Manuel Landry, PT 04/09/23, 12:56 PM  [facility] [address]  [address] [place], [place], [address] Phone: [phone]   Fax:  [phone]

## 2023-04-10 ENCOUNTER — Other Ambulatory Visit: Payer: Self-pay | Admitting: Family Medicine

## 2023-04-10 DIAGNOSIS — M1612 Unilateral primary osteoarthritis, left hip: Secondary | ICD-10-CM

## 2023-04-12 ENCOUNTER — Ambulatory Visit: Payer: Medicare Other

## 2023-04-12 ENCOUNTER — Other Ambulatory Visit (HOSPITAL_BASED_OUTPATIENT_CLINIC_OR_DEPARTMENT_OTHER): Payer: Self-pay

## 2023-04-12 DIAGNOSIS — M5432 Sciatica, left side: Secondary | ICD-10-CM

## 2023-04-12 DIAGNOSIS — M5386 Other specified dorsopathies, lumbar region: Secondary | ICD-10-CM

## 2023-04-12 DIAGNOSIS — R262 Difficulty in walking, not elsewhere classified: Secondary | ICD-10-CM

## 2023-04-12 DIAGNOSIS — M25652 Stiffness of left hip, not elsewhere classified: Secondary | ICD-10-CM | POA: Diagnosis not present

## 2023-04-12 NOTE — Therapy (Signed)
OUTPATIENT PHYSICAL THERAPY THORACOLUMBAR TREATMENT     Patient Name: Manuel Landry MRN: [id number] DOB:Nov 26, 1945, 77 y.o., male Today's Date: 04/12/2023  END OF SESSION:  PT End of Session - 04/12/23 0806     Visit Number 11    Date for PT Re-Evaluation 04/29/23    Progress Note Due on Visit 10    PT Start Time 0800    PT Stop Time 0840    PT Time Calculation (min) 40 min    Activity Tolerance Patient tolerated treatment well    Behavior During Therapy WFL for tasks assessed/performed                  Past Medical History:  Diagnosis Date   Allergy    Anxiety    Arthritis    Blind loop syndrome    Bulging discs    LOWER BACK   Cataract    "beginnings of"   Diverticulosis    Family history of malignant neoplasm of gastrointestinal tract    History of adenomatous polyp of colon    History of Barrett's esophagus    History of chronic gastritis    Hyperlipidemia    no meds taken   IBS (irritable bowel syndrome)    Internal hemorrhoids    Malignant neoplasm of prostate [facility]) urologist-  dr dahlstedt/  oncologist-  dr Kathrynn Running   dx 08-09-2017-- Stage T2a, Gleason 4+5, PSA 3.22 (on finateride)--- treatment ADT and external beam radiation   PONV (postoperative nausea and vomiting)    Seasonal allergic rhinitis    Tubular adenoma of colon    Wears glasses    Past Surgical History:  Procedure Laterality Date   COLONOSCOPY  last one 10-21-2016  dr Rhea Belton   CYSTOSCOPY WITH INSERTION OF UROLIFT N/A 12/15/2017   Procedure: CYSTOSCOPY WITH INSERTION OF UROLIFT, BIOPSY  PROSTATIC URETHAL;  Surgeon: Marcine Matar, MD;  Location: [facility];  Service: Urology;  Laterality: N/A;   ESOPHAGOGASTRODUODENOSCOPY (EGD) WITH PROPOFOL  last one 09-26-2013   dr pyrtle   INGUINAL HERNIA REPAIR Bilateral right 10-25-1997:  left 09-28-2008 (dr m. Daphine Deutscher @ [facility])   LAPAROSCOPIC CHOLECYSTECTOMY  2012   MANDIBLE SURGERY Bilateral 1990s   removal calcified bone  growths   ROTATOR CUFF REPAIR Right 03-26-2009   dr Shelle Iron  [facility]   SHOULDER OPEN ROTATOR CUFF REPAIR  03/17/2012   Procedure: ROTATOR CUFF REPAIR SHOULDER OPEN;  Surgeon: Javier Docker, MD;  Location: [facility];  Service: Orthopedics;  Laterality: Left;  LEFT MINI OPEN ROTATOR CUFF REPAIR    SPACE OAR INSTILLATION N/A 12/15/2017   Procedure: SPACE OAR INSTILLATION;  Surgeon: Marcine Matar, MD;  Location: [facility];  Service: Urology;  Laterality: N/A;   TONSILLECTOMY  child   UPPER GASTROINTESTINAL ENDOSCOPY     Patient Active Problem List   Diagnosis Date Noted   Elevated blood pressure reading 06/11/2022   Seasonal allergies 03/03/2022   Chronic rhinitis 03/06/2021   Nocturnal muscle cramp 09/02/2020   GAD (generalized anxiety disorder) 09/02/2020   Closed nondisplaced fracture of body of left scapula 04/15/2020   Situational anxiety 12/27/2018   Trigger finger of left thumb 11/17/2018   Hyperlipidemia 01/27/2017   Cardiovascular risk factor 08/12/2011   S/P cholecystectomy 06/22/2011   History of colonic polyps 06/22/2011   FLATULENCE-GAS-BLOATING 02/13/2010   Calculus of gallbladder 02/11/2010   GERD 02/10/2010   Diverticulosis of colon 02/10/2010   Nausea 02/10/2010   ABDOMINAL PAIN RIGHT UPPER QUADRANT 02/10/2010  ABDOMINAL PAIN, EPIGASTRIC 02/10/2010   ANEMIA-IRON DEFICIENCY 07/17/2008   Inguinal hernia 07/17/2008   IRRITABLE BOWEL SYNDROME 07/17/2008   Gastritis and gastroduodenitis 07/11/2008   Duodenitis 07/11/2008   RENAL CYST, RIGHT 07/11/2008   DYSPEPSIA 06/12/2008   Constipation 05/31/2008   Backache 05/31/2008   BENIGN PROSTATIC HYPERTROPHY, MILD, HX OF 05/31/2008    PCP: Arva Chafe , DO  REFERRING PROVIDER: same  REFERRING DIAG: L sciatic pain  Rationale for Evaluation and Treatment: Rehabilitation  THERAPY DIAG:  Sciatica, left side  Difficulty in walking, not elsewhere classified  Stiffness of left hip, not elsewhere  classified  Decreased ROM of lumbar spine  ONSET DATE: 3 weeks, Sept 28 2024  SUBJECTIVE:                                                                                                                                                                                           SUBJECTIVE STATEMENT: Pt reports he still has the discomfort along his anterior thigh PERTINENT HISTORY:  Patient has gone to his physician 2 x , taking ibuprofen and muscle relaxers without much relief.  Referred to PT for eval and treat  PAIN:  Are you having pain? Yes: NPRS scale: 3/10 Pain location: L ant thigh into L lat hip and L SI jt Pain description: cramp that wont let up Aggravating factors: trying to walk, walking dog  Relieving factors: ice, TENS, med, minimal relief  PRECAUTIONS: None  RED FLAGS: None   WEIGHT BEARING RESTRICTIONS: No  FALLS:  Has patient fallen in last 6 months? No  LIVING ENVIRONMENT: Lives with: lives with their spouse Lives in: House/apartment Stairs: No Has following equipment at home: Single point cane  OCCUPATION: retired  PLOF: Independent  PATIENT GOALS: get rid of pain, be able to walk dog  NEXT MD VISIT: unclear  OBJECTIVE:  Note: Objective measures were completed at Evaluation unless otherwise noted.  DIAGNOSTIC FINDINGS:  none  PATIENT SURVEYS:  Modified Oswestry 20/50   SCREENING FOR RED FLAGS: Bowel or bladder incontinence: No Spinal tumors: No Cauda equina syndrome: No Compression fracture: No Abdominal aneurysm: No  COGNITION: Overall cognitive status: Within functional limits for tasks assessed     SENSATION: Reports radicular pain L thigh, no numbness, neural Sx  MUSCLE LENGTH: Hamstrings: Right wfl deg; Left wfl deg [name] test: Right -5 deg; Left -15 deg  POSTURE: flexed trunk  and loss of lumbar lordosis and loss of gluteal mass B  PALPATION: Pt tender proximal L quads, L TFL prox attachment L glut medius and L glut  maximus Pain with PA segmental testing into L SI jt at  L4/5 and L5/S1  LUMBAR ROM:   AROM eval  Flexion 70  Extension 50 P! L SI  Right lateral flexion 50  Left lateral flexion 50 p! L SI  Right rotation   Left rotation    (Blank rows = not tested)  LOWER EXTREMITY ROM:     Passive  Right eval Left eval L 04/09/23  Hip flexion 125 110 105  Hip extension -5 -15   Hip abduction     Hip adduction     Hip internal rotation 12 3 20   Hip external rotation -20 -15   Knee flexion     Knee extension     Ankle dorsiflexion     Ankle plantarflexion     Ankle inversion     Ankle eversion      (Blank rows = wfl)  LOWER EXTREMITY MMT:    MMT Right eval Left eval L 04/09/23  Hip flexion  P! 3-   Hip extension     Hip abduction  5   Hip adduction     Hip internal rotation     Hip external rotation  5   Knee flexion     Knee extension  4-/5 P! 4/5  no pain  Ankle dorsiflexion 4 4   Ankle plantarflexion 4 4   Ankle inversion     Ankle eversion      (Blank rows = wfl)  LUMBAR SPECIAL TESTS:  Straight leg raise test: Negative and FABER test: Negative  FUNCTIONAL TESTS:  30 sec sit to stand TBD   6 min walk test  unable today, antalgic/painful gait  GAIT: Distance walked: 24' Assistive device utilized: None Level of assistance: Complete Independence Comments: antalgic on L, decreased stance time on L Also noted L hip remains in flexed position to 15 degrees in supine  TODAY'S TREATMENT:                                                                                                                              DATE: 04/12/23 Therapeutic Exercise: to improve strength and mobility.  Demo, verbal and tactile cues throughout for technique. Nustep L5x49min UE/LE Functional squats 2 x 10  Standing hip abduction 2# x 10 BLE Standing hip extension 2# x 10 BLE Standing marching 2# x 10 BLE Step downs 4' x 15 RLE  Supine trunk rotation x 10  Supine HS curls with green  pball x 20  04/09/23 Therapeutic Exercise: to improve strength and mobility.  Demo, verbal and tactile cues throughout for technique.  Bike L3x43min Fitter press BLE x 10  Side steps RTB at ankles 2x58ft Monster walk RTB at ankles 2x63ft Therapeutic Activity: L hip AROM and quad strength assessed : 1,125 ft no rest, no hip pain just tight on anterior thigh  04/06/23 Manual Therapy: Supine L PROM hip each movement  Long leg distraction LLE STM to L hip flexors and quads TherEx: LTR both ways x 10  Femoral nerve glide 2 x 10 / Prone knee bend Nustep L5x45min LE only Step ups 8' x 10 BLE Lateral step ups 8' x 10 BLE Standing marching x 6 BLE on UE support  04/01/23: Manual :  Supine for mobilization with movement, lateral/ inf distraction of L femoral head, for L hip flexion, 3 bouts  Recumbent bicycle:   level 2 x 5 min BATCA Knee extension 15# B LE's up, L LE down( eccentric quads) BATCA B knee flexion, L LE up (eccentric hamstrings) 35#, 3 x15 Standing L hip ext 3# cuff wt , 20x  Standing L hip abd 3# cuff wt, 20x Standing L hip flexion 3# cuff wt 20x Leg press, 25#, B LE's 3 x 10   PATIENT EDUCATION:  Education details: HEP- hamstring stretch Person educated: Patient Education method: Explanation, Demonstration, and Tactile cues Education comprehension: verbalized understanding, returned demonstration, and verbal cues required  HOME EXERCISE PROGRAM: Provided written directions for the theraband hip abd, advised to perform 2 x day x 15 reps   Access Code: [email or web address] URL: [email or web address] Date: 03/19/2023 Prepared by: Verta Ellen  Exercises - Seated Hamstring Stretch  - 2 x daily - 7 x weekly - 3 sets - 3 reps - 30 sec hold   ASSESSMENT:  CLINICAL IMPRESSION: Patient is a 77 y.o. male who participated today with skilled physical therapy to address referring diagnosis of sudden onset of L SI pain.  He responded well to the treatment.  Emphasized more functional strength of LE today. Provided cues as needed to correct form and technique.  OBJECTIVE IMPAIRMENTS: Abnormal gait, decreased activity tolerance, decreased endurance, difficulty walking, decreased ROM, decreased strength, hypomobility, and pain.   ACTIVITY LIMITATIONS: carrying, lifting, bending, squatting, sleeping, stairs, and locomotion level  PARTICIPATION LIMITATIONS: laundry, driving, shopping, community activity, and yard work  PERSONAL FACTORS: Age, Behavior pattern, Past/current experiences, Time since onset of injury/illness/exacerbation, and 1-2 comorbidities: h/o prostate ca, h/o rotator cuff repair  are also affecting patient's functional outcome.   REHAB POTENTIAL: Good  CLINICAL DECISION MAKING: Stable/uncomplicated  EVALUATION COMPLEXITY: Low   GOALS: Goals reviewed with patient? Yes  SHORT TERM GOALS: Target date: 03/18/23  I HEP Baseline: Goal status: MET   LONG TERM GOALS: Target date: 04/29/23  Modified oswestry decrease from 20/50 to 10/50 Baseline:  Goal status: PROGRESSING  2.  Improve L hip ROM for flexion to 115 and IR to 10 from 110 and 3 for impoved standing tolerance, gait mechanics Baseline:  Goal status: - met for IR (04/09/23) progressing with flexion  3.  Able to complete 6 min walk test without L thigh/post hip pain for improved function  Baseline: pain with all gait Goal status: MET- 04/09/23  4.  Improve L quads strength to 4+/5 for improved ability to descend hills, walk Baseline:  Goal status: PROGRESSING- 04/09/23  PLAN:  PT FREQUENCY: 2x/week  PT DURATION: 8 weeks  PLANNED INTERVENTIONS: 97110-Therapeutic exercises, 97530- Therapeutic activity, 97112- Neuromuscular re-education, 97535- Self Care, 24401- Manual therapy, and 97116- Gait training.  PLAN FOR NEXT SESSION: need to do Modified Oswestry; L quads strengthening, L ant hip, thigh stretching, manual techniques, L hip strength   Darleene Cleaver, PTA 04/12/2023, 8:45 AM  [facility] [address]  [address] [place], [place], [address] Phone: [phone]   Fax:  [phone]

## 2023-04-13 ENCOUNTER — Other Ambulatory Visit: Payer: Self-pay | Admitting: Family Medicine

## 2023-04-19 ENCOUNTER — Other Ambulatory Visit (HOSPITAL_BASED_OUTPATIENT_CLINIC_OR_DEPARTMENT_OTHER): Payer: Self-pay

## 2023-04-19 ENCOUNTER — Encounter: Payer: Self-pay | Admitting: Family Medicine

## 2023-04-19 ENCOUNTER — Other Ambulatory Visit: Payer: Self-pay | Admitting: Family Medicine

## 2023-04-19 DIAGNOSIS — M25552 Pain in left hip: Secondary | ICD-10-CM | POA: Diagnosis not present

## 2023-04-19 DIAGNOSIS — M545 Low back pain, unspecified: Secondary | ICD-10-CM

## 2023-04-19 MED ORDER — IBUPROFEN 800 MG PO TABS
800.0000 mg | ORAL_TABLET | Freq: Three times a day (TID) | ORAL | 0 refills | Status: DC | PRN
  Filled 2023-04-19: qty 30, 10d supply, fill #0

## 2023-04-20 ENCOUNTER — Ambulatory Visit: Payer: Medicare Other | Attending: Family Medicine

## 2023-04-20 ENCOUNTER — Other Ambulatory Visit: Payer: Self-pay

## 2023-04-20 DIAGNOSIS — R262 Difficulty in walking, not elsewhere classified: Secondary | ICD-10-CM | POA: Insufficient documentation

## 2023-04-20 DIAGNOSIS — M5432 Sciatica, left side: Secondary | ICD-10-CM | POA: Diagnosis not present

## 2023-04-20 DIAGNOSIS — M5386 Other specified dorsopathies, lumbar region: Secondary | ICD-10-CM | POA: Insufficient documentation

## 2023-04-20 DIAGNOSIS — M545 Low back pain, unspecified: Secondary | ICD-10-CM

## 2023-04-20 DIAGNOSIS — M25652 Stiffness of left hip, not elsewhere classified: Secondary | ICD-10-CM | POA: Diagnosis not present

## 2023-04-20 NOTE — Telephone Encounter (Signed)
Placed new referral and sent gwen message.

## 2023-04-20 NOTE — Therapy (Signed)
OUTPATIENT PHYSICAL THERAPY THORACOLUMBAR TREATMENT       Patient Name: Manuel Landry MRN: [id number] DOB:[birth year], 77 y.o., male Today's Date: 04/20/2023  END OF SESSION:  PT End of Session - 04/20/23 1308     Visit Number 12    Date for PT Re-Evaluation 04/29/23    Progress Note Due on Visit 10    PT Start Time 1315    PT Stop Time 1400    PT Time Calculation (min) 45 min    Activity Tolerance Patient tolerated treatment well    Behavior During Therapy WFL for tasks assessed/performed                   Past Medical History:  Diagnosis Date   Allergy    Anxiety    Arthritis    Blind loop syndrome    Bulging discs    LOWER BACK   Cataract    "beginnings of"   Diverticulosis    Family history of malignant neoplasm of gastrointestinal tract    History of adenomatous polyp of colon    History of Barrett's esophagus    History of chronic gastritis    Hyperlipidemia    no meds taken   IBS (irritable bowel syndrome)    Internal hemorrhoids    Malignant neoplasm of prostate [facility]) urologist-  dr dahlstedt/  oncologist-  dr Kathrynn Running   dx 08-09-2017-- Stage T2a, Gleason 4+5, PSA 3.22 (on finateride)--- treatment ADT and external beam radiation   PONV (postoperative nausea and vomiting)    Seasonal allergic rhinitis    Tubular adenoma of colon    Wears glasses    Past Surgical History:  Procedure Laterality Date   COLONOSCOPY  last one 10-21-2016  dr Rhea Belton   CYSTOSCOPY WITH INSERTION OF UROLIFT N/A 12/15/2017   Procedure: CYSTOSCOPY WITH INSERTION OF UROLIFT, BIOPSY  PROSTATIC URETHAL;  Surgeon: Marcine Matar, MD;  Location: [facility];  Service: Urology;  Laterality: N/A;   ESOPHAGOGASTRODUODENOSCOPY (EGD) WITH PROPOFOL  last one 09-26-2013   dr pyrtle   INGUINAL HERNIA REPAIR Bilateral right 10-25-1997:  left 09-28-2008 (dr m. Daphine Deutscher @ [facility])   LAPAROSCOPIC CHOLECYSTECTOMY  2012   MANDIBLE SURGERY Bilateral 1990s   removal calcified  bone growths   ROTATOR CUFF REPAIR Right 03-26-2009   dr Shelle Iron  [facility]   SHOULDER OPEN ROTATOR CUFF REPAIR  03/17/2012   Procedure: ROTATOR CUFF REPAIR SHOULDER OPEN;  Surgeon: Javier Docker, MD;  Location: [facility];  Service: Orthopedics;  Laterality: Left;  LEFT MINI OPEN ROTATOR CUFF REPAIR    SPACE OAR INSTILLATION N/A 12/15/2017   Procedure: SPACE OAR INSTILLATION;  Surgeon: Marcine Matar, MD;  Location: [facility];  Service: Urology;  Laterality: N/A;   TONSILLECTOMY  child   UPPER GASTROINTESTINAL ENDOSCOPY     Patient Active Problem List   Diagnosis Date Noted   Elevated blood pressure reading 06/11/2022   Seasonal allergies 03/03/2022   Chronic rhinitis 03/06/2021   Nocturnal muscle cramp 09/02/2020   GAD (generalized anxiety disorder) 09/02/2020   Closed nondisplaced fracture of body of left scapula 04/15/2020   Situational anxiety 12/27/2018   Trigger finger of left thumb 11/17/2018   Hyperlipidemia 01/27/2017   Cardiovascular risk factor 08/12/2011   S/P cholecystectomy 06/22/2011   History of colonic polyps 06/22/2011   FLATULENCE-GAS-BLOATING 02/13/2010   Calculus of gallbladder 02/11/2010   GERD 02/10/2010   Diverticulosis of colon 02/10/2010   Nausea 02/10/2010   ABDOMINAL PAIN RIGHT UPPER  QUADRANT 02/10/2010   ABDOMINAL PAIN, EPIGASTRIC 02/10/2010   ANEMIA-IRON DEFICIENCY 07/17/2008   Inguinal hernia 07/17/2008   IRRITABLE BOWEL SYNDROME 07/17/2008   Gastritis and gastroduodenitis 07/11/2008   Duodenitis 07/11/2008   RENAL CYST, RIGHT 07/11/2008   DYSPEPSIA 06/12/2008   Constipation 05/31/2008   Backache 05/31/2008   BENIGN PROSTATIC HYPERTROPHY, MILD, HX OF 05/31/2008    PCP: Arva Chafe , DO  REFERRING PROVIDER: same  REFERRING DIAG: L sciatic pain  Rationale for Evaluation and Treatment: Rehabilitation  THERAPY DIAG:  Sciatica, left side  Difficulty in walking, not elsewhere classified  Stiffness of left hip, not  elsewhere classified  Decreased ROM of lumbar spine  ONSET DATE: 3 weeks, Sept 28 2024  SUBJECTIVE:                                                                                                                                                                                           SUBJECTIVE STATEMENT: Pt reports he saw an orthopedist through [facility] and was told that he needs a hip replacement and has advanced arthritis L hip jt. He is not going to pursue hip replacement. Does think that the aggressive strengthening of his legs irritates his L hip so requests more stretching, manual techniques  PERTINENT HISTORY:  Patient has gone to his physician 2 x , taking ibuprofen and muscle relaxers without much relief.  Referred to PT for eval and treat  PAIN:  Are you having pain? Yes: NPRS scale: 3/10 Pain location: L ant thigh into L lat hip and L SI jt Pain description: cramp that wont let up Aggravating factors: trying to walk, walking dog  Relieving factors: ice, TENS, med, minimal relief  PRECAUTIONS: None  RED FLAGS: None   WEIGHT BEARING RESTRICTIONS: No  FALLS:  Has patient fallen in last 6 months? No  LIVING ENVIRONMENT: Lives with: lives with their spouse Lives in: House/apartment Stairs: No Has following equipment at home: Single point cane  OCCUPATION: retired  PLOF: Independent  PATIENT GOALS: get rid of pain, be able to walk dog  NEXT MD VISIT: unclear  OBJECTIVE:  Note: Objective measures were completed at Evaluation unless otherwise noted.  DIAGNOSTIC FINDINGS:  none  PATIENT SURVEYS:  Modified Oswestry 20/50   SCREENING FOR RED FLAGS: Bowel or bladder incontinence: No Spinal tumors: No Cauda equina syndrome: No Compression fracture: No Abdominal aneurysm: No  COGNITION: Overall cognitive status: Within functional limits for tasks assessed     SENSATION: Reports radicular pain L thigh, no numbness, neural Sx  MUSCLE  LENGTH: Hamstrings: Right wfl deg; Left wfl deg [name] test: Right -5 deg; Left -  15 deg  POSTURE: flexed trunk  and loss of lumbar lordosis and loss of gluteal mass B  PALPATION: Pt tender proximal L quads, L TFL prox attachment L glut medius and L glut maximus Pain with PA segmental testing into L SI jt at L4/5 and L5/S1  LUMBAR ROM:   AROM eval  Flexion 70  Extension 50 P! L SI  Right lateral flexion 50  Left lateral flexion 50 p! L SI  Right rotation   Left rotation    (Blank rows = not tested)  LOWER EXTREMITY ROM:     Passive  Right eval Left eval L 04/09/23  Hip flexion 125 110 105  Hip extension -5 -15   Hip abduction     Hip adduction     Hip internal rotation 12 3 20   Hip external rotation -20 -15   Knee flexion     Knee extension     Ankle dorsiflexion     Ankle plantarflexion     Ankle inversion     Ankle eversion      (Blank rows = wfl)  LOWER EXTREMITY MMT:    MMT Right eval Left eval L 04/09/23  Hip flexion  P! 3-   Hip extension     Hip abduction  5   Hip adduction     Hip internal rotation     Hip external rotation  5   Knee flexion     Knee extension  4-/5 P! 4/5  no pain  Ankle dorsiflexion 4 4   Ankle plantarflexion 4 4   Ankle inversion     Ankle eversion      (Blank rows = wfl)  LUMBAR SPECIAL TESTS:  Straight leg raise test: Negative and FABER test: Negative  FUNCTIONAL TESTS:  30 sec sit to stand TBD   6 min walk test  unable today, antalgic/painful gait  GAIT: Distance walked: 18' Assistive device utilized: None Level of assistance: Complete Independence Comments: antalgic on L, decreased stance time on L Also noted L hip remains in flexed position to 15 degrees in supine  TODAY'S TREATMENT:                                                                                                                              DATE: 04/20/23 Therex: nustep 5 min level 5, UE and Les' Instructed pt in R side lying stretch for L SI  jt line, lower L lumbar spine   Manual: supine for mob with movement L hip, with lateral femoral head distraction combined with L hip flex, as well as ER R sidelying for muscle energy to improve mobility L SI jt line, with manual resistance to posteriorly rotate pelvis, 5 bouts Sidelying R for deep massage L post hip musculature   04/12/23 Therapeutic Exercise: to improve strength and mobility.  Demo, verbal and tactile cues throughout for technique. Nustep L5x50min UE/LE Functional squats 2 x 10  Standing hip abduction  2# x 10 BLE Standing hip extension 2# x 10 BLE Standing marching 2# x 10 BLE Step downs 4' x 15 RLE  Supine trunk rotation x 10  Supine HS curls with green pball x 20  04/09/23 Therapeutic Exercise: to improve strength and mobility.  Demo, verbal and tactile cues throughout for technique.  Bike L3x37min Fitter press BLE x 10  Side steps RTB at ankles 2x37ft Monster walk RTB at ankles 2x82ft Therapeutic Activity: L hip AROM and quad strength assessed : 1,125 ft no rest, no hip pain just tight on anterior thigh  04/06/23 Manual Therapy: Supine L PROM hip each movement  Long leg distraction LLE STM to L hip flexors and quads TherEx: LTR both ways x 10  Femoral nerve glide 2 x 10 / Prone knee bend Nustep L5x74min LE only Step ups 8' x 10 BLE Lateral step ups 8' x 10 BLE Standing marching x 6 BLE on UE support  04/01/23: Manual :  Supine for mobilization with movement, lateral/ inf distraction of L femoral head, for L hip flexion, 3 bouts  Recumbent bicycle:   level 2 x 5 min BATCA Knee extension 15# B LE's up, L LE down( eccentric quads) BATCA B knee flexion, L LE up (eccentric hamstrings) 35#, 3 x15 Standing L hip ext 3# cuff wt , 20x  Standing L hip abd 3# cuff wt, 20x Standing L hip flexion 3# cuff wt 20x Leg press, 25#, B LE's 3 x 10   PATIENT EDUCATION:  Education details: HEP- hamstring stretch Person educated: Patient Education method:  Explanation, Demonstration, and Tactile cues Education comprehension: verbalized understanding, returned demonstration, and verbal cues required  HOME EXERCISE PROGRAM: Provided written directions for the theraband hip abd, advised to perform 2 x day x 15 reps   Access Code: [email or web address] URL: [email or web address] Date: 03/19/2023 Prepared by: Verta Ellen  Exercises - Seated Hamstring Stretch  - 2 x daily - 7 x weekly - 3 sets - 3 reps - 30 sec hold   ASSESSMENT:  CLINICAL IMPRESSION: Patient is a 77 y.o. male who participated today again with pain management due to L sided Si jt line and hip pain.  He requested defer strengthening today as seems to irritate his jts. Focused more on techniques to improve L SI jt pain and mobility. Saw an orthopedist who told him he has advanced arthritis L hip, but he will be seeing another orthopedist in the next couple of week for a second opinion. OBJECTIVE IMPAIRMENTS: Abnormal gait, decreased activity tolerance, decreased endurance, difficulty walking, decreased ROM, decreased strength, hypomobility, and pain.   ACTIVITY LIMITATIONS: carrying, lifting, bending, squatting, sleeping, stairs, and locomotion level  PARTICIPATION LIMITATIONS: laundry, driving, shopping, community activity, and yard work  PERSONAL FACTORS: Age, Behavior pattern, Past/current experiences, Time since onset of injury/illness/exacerbation, and 1-2 comorbidities: h/o prostate ca, h/o rotator cuff repair  are also affecting patient's functional outcome.   REHAB POTENTIAL: Good  CLINICAL DECISION MAKING: Stable/uncomplicated  EVALUATION COMPLEXITY: Low   GOALS: Goals reviewed with patient? Yes  SHORT TERM GOALS: Target date: 03/18/23  I HEP Baseline: Goal status: MET   LONG TERM GOALS: Target date: 04/29/23  Modified oswestry decrease from 20/50 to 10/50 Baseline:  Goal status: PROGRESSING  2.  Improve L hip ROM for flexion to 115 and IR to 10  from 110 and 3 for impoved standing tolerance, gait mechanics Baseline:  Goal status: - met for IR (04/09/23) progressing with flexion  3.  Able to complete 6  min walk test without L thigh/post hip pain for improved function  Baseline: pain with all gait Goal status: MET- 04/09/23  4.  Improve L quads strength to 4+/5 for improved ability to descend hills, walk Baseline:  Goal status: PROGRESSING- 04/09/23  PLAN:  PT FREQUENCY: 2x/week  PT DURATION: 8 weeks  PLANNED INTERVENTIONS: 97110-Therapeutic exercises, 97530- Therapeutic activity, 97112- Neuromuscular re-education, 97535- Self Care, 10272- Manual therapy, and 97116- Gait training.  PLAN FOR NEXT SESSION: continue with manual techniques, he is to complete PT in the next 2 weeks.  Early Chars, PT, DPT, OCS 04/20/2023, 5:39 PM  [facility] [address]  [address] [place], [place], [address] Phone: [phone]   Fax:  [phone]

## 2023-04-22 ENCOUNTER — Ambulatory Visit: Payer: Medicare Other

## 2023-04-22 DIAGNOSIS — M5386 Other specified dorsopathies, lumbar region: Secondary | ICD-10-CM

## 2023-04-22 DIAGNOSIS — M25652 Stiffness of left hip, not elsewhere classified: Secondary | ICD-10-CM

## 2023-04-22 DIAGNOSIS — R262 Difficulty in walking, not elsewhere classified: Secondary | ICD-10-CM | POA: Diagnosis not present

## 2023-04-22 DIAGNOSIS — M5432 Sciatica, left side: Secondary | ICD-10-CM

## 2023-04-22 NOTE — Therapy (Signed)
OUTPATIENT PHYSICAL THERAPY THORACOLUMBAR TREATMENT       Patient Name: Manuel Landry MRN: [id number] DOB:Feb 04, 1946, 77 y.o., male Today's Date: 04/22/2023  END OF SESSION:  PT End of Session - 04/22/23 1318     Visit Number 13    Date for PT Re-Evaluation 04/29/23    Progress Note Due on Visit 10    PT Start Time 1315    PT Stop Time 1400    PT Time Calculation (min) 45 min    Activity Tolerance Patient tolerated treatment well    Behavior During Therapy WFL for tasks assessed/performed                    Past Medical History:  Diagnosis Date   Allergy    Anxiety    Arthritis    Blind loop syndrome    Bulging discs    LOWER BACK   Cataract    "beginnings of"   Diverticulosis    Family history of malignant neoplasm of gastrointestinal tract    History of adenomatous polyp of colon    History of Barrett's esophagus    History of chronic gastritis    Hyperlipidemia    no meds taken   IBS (irritable bowel syndrome)    Internal hemorrhoids    Malignant neoplasm of prostate [facility]) urologist-  dr dahlstedt/  oncologist-  dr Kathrynn Running   dx 08-09-2017-- Stage T2a, Gleason 4+5, PSA 3.22 (on finateride)--- treatment ADT and external beam radiation   PONV (postoperative nausea and vomiting)    Seasonal allergic rhinitis    Tubular adenoma of colon    Wears glasses    Past Surgical History:  Procedure Laterality Date   COLONOSCOPY  last one 10-21-2016  dr Rhea Belton   CYSTOSCOPY WITH INSERTION OF UROLIFT N/A 12/15/2017   Procedure: CYSTOSCOPY WITH INSERTION OF UROLIFT, BIOPSY  PROSTATIC URETHAL;  Surgeon: Marcine Matar, MD;  Location: [facility] [place];  Service: Urology;  Laterality: N/A;   ESOPHAGOGASTRODUODENOSCOPY (EGD) WITH PROPOFOL  last one 09-26-2013   dr pyrtle   INGUINAL HERNIA REPAIR Bilateral right 10-25-1997:  left 09-28-2008 (dr m. Daphine Deutscher @ [facility])   LAPAROSCOPIC CHOLECYSTECTOMY  2012   MANDIBLE SURGERY Bilateral 1990s   removal  calcified bone growths   ROTATOR CUFF REPAIR Right 03-26-2009   dr Shelle Iron  [facility]   SHOULDER OPEN ROTATOR CUFF REPAIR  03/17/2012   Procedure: ROTATOR CUFF REPAIR SHOULDER OPEN;  Surgeon: Javier Docker, MD;  Location: [facility];  Service: Orthopedics;  Laterality: Left;  LEFT MINI OPEN ROTATOR CUFF REPAIR    SPACE OAR INSTILLATION N/A 12/15/2017   Procedure: SPACE OAR INSTILLATION;  Surgeon: Marcine Matar, MD;  Location: [facility];  Service: Urology;  Laterality: N/A;   TONSILLECTOMY  child   UPPER GASTROINTESTINAL ENDOSCOPY     Patient Active Problem List   Diagnosis Date Noted   Elevated blood pressure reading 06/11/2022   Seasonal allergies 03/03/2022   Chronic rhinitis 03/06/2021   Nocturnal muscle cramp 09/02/2020   GAD (generalized anxiety disorder) 09/02/2020   Closed nondisplaced fracture of body of left scapula 04/15/2020   Situational anxiety 12/27/2018   Trigger finger of left thumb 11/17/2018   Hyperlipidemia 01/27/2017   Cardiovascular risk factor 08/12/2011   S/P cholecystectomy 06/22/2011   History of colonic polyps 06/22/2011   FLATULENCE-GAS-BLOATING 02/13/2010   Calculus of gallbladder 02/11/2010   GERD 02/10/2010   Diverticulosis of colon 02/10/2010   Nausea 02/10/2010   ABDOMINAL PAIN RIGHT  UPPER QUADRANT 02/10/2010   ABDOMINAL PAIN, EPIGASTRIC 02/10/2010   ANEMIA-IRON DEFICIENCY 07/17/2008   Inguinal hernia 07/17/2008   IRRITABLE BOWEL SYNDROME 07/17/2008   Gastritis and gastroduodenitis 07/11/2008   Duodenitis 07/11/2008   RENAL CYST, RIGHT 07/11/2008   DYSPEPSIA 06/12/2008   Constipation 05/31/2008   Backache 05/31/2008   BENIGN PROSTATIC HYPERTROPHY, MILD, HX OF 05/31/2008    PCP: Arva Chafe , DO  REFERRING PROVIDER: same  REFERRING DIAG: L sciatic pain  Rationale for Evaluation and Treatment: Rehabilitation  THERAPY DIAG:  Sciatica, left side  Difficulty in walking, not elsewhere classified  Stiffness of left hip,  not elsewhere classified  Decreased ROM of lumbar spine  ONSET DATE: 3 weeks, Sept 28 2024  SUBJECTIVE:                                                                                                                                                                                           SUBJECTIVE STATEMENT: Pt reports he went to surgeon, he does not want hip replacement but he wants to see if he can get an injection for his L hip.  PERTINENT HISTORY:  Patient has gone to his physician 2 x , taking ibuprofen and muscle relaxers without much relief.  Referred to PT for eval and treat  PAIN:  Are you having pain? Yes: NPRS scale: 3/10 Pain location: L ant thigh into L lat hip and L SI jt Pain description: cramp that wont let up Aggravating factors: trying to walk, walking dog  Relieving factors: ice, TENS, med, minimal relief  PRECAUTIONS: None  RED FLAGS: None   WEIGHT BEARING RESTRICTIONS: No  FALLS:  Has patient fallen in last 6 months? No  LIVING ENVIRONMENT: Lives with: lives with their spouse Lives in: House/apartment Stairs: No Has following equipment at home: Single point cane  OCCUPATION: retired  PLOF: Independent  PATIENT GOALS: get rid of pain, be able to walk dog  NEXT MD VISIT: unclear  OBJECTIVE:  Note: Objective measures were completed at Evaluation unless otherwise noted.  DIAGNOSTIC FINDINGS:  none  PATIENT SURVEYS:  Modified Oswestry 20/50   SCREENING FOR RED FLAGS: Bowel or bladder incontinence: No Spinal tumors: No Cauda equina syndrome: No Compression fracture: No Abdominal aneurysm: No  COGNITION: Overall cognitive status: Within functional limits for tasks assessed     SENSATION: Reports radicular pain L thigh, no numbness, neural Sx  MUSCLE LENGTH: Hamstrings: Right wfl deg; Left wfl deg [name] test: Right -5 deg; Left -15 deg  POSTURE: flexed trunk  and loss of lumbar lordosis and loss of gluteal mass B  PALPATION: Pt  tender proximal L  quads, L TFL prox attachment L glut medius and L glut maximus Pain with PA segmental testing into L SI jt at L4/5 and L5/S1  LUMBAR ROM:   AROM eval  Flexion 70  Extension 50 P! L SI  Right lateral flexion 50  Left lateral flexion 50 p! L SI  Right rotation   Left rotation    (Blank rows = not tested)  LOWER EXTREMITY ROM:     Passive  Right eval Left eval L 04/09/23  Hip flexion 125 110 105  Hip extension -5 -15   Hip abduction     Hip adduction     Hip internal rotation 12 3 20   Hip external rotation -20 -15   Knee flexion     Knee extension     Ankle dorsiflexion     Ankle plantarflexion     Ankle inversion     Ankle eversion      (Blank rows = wfl)  LOWER EXTREMITY MMT:    MMT Right eval Left eval L 04/09/23  Hip flexion  P! 3-   Hip extension     Hip abduction  5   Hip adduction     Hip internal rotation     Hip external rotation  5   Knee flexion     Knee extension  4-/5 P! 4/5  no pain  Ankle dorsiflexion 4 4   Ankle plantarflexion 4 4   Ankle inversion     Ankle eversion      (Blank rows = wfl)  LUMBAR SPECIAL TESTS:  Straight leg raise test: Negative and FABER test: Negative  FUNCTIONAL TESTS:  30 sec sit to stand TBD   6 min walk test  unable today, antalgic/painful gait  GAIT: Distance walked: 50' Assistive device utilized: None Level of assistance: Complete Independence Comments: antalgic on L, decreased stance time on L Also noted L hip remains in flexed position to 15 degrees in supine  TODAY'S TREATMENT:                                                                                                                              DATE: 04/22/23 Nustep L5x39min Manual Therapy: supine for mob with movement L hip, with lateral femoral head distraction combined with L hip flex R sidelying for muscle energy to improve mobility L SI jt line, with manual resistance to posteriorly rotate pelvis S/L for deep massage L hip  rotators, glute med, glute max  04/20/23 Therex: nustep 5 min level 5, UE and Les' Instructed pt in R side lying stretch for L SI jt line, lower L lumbar spine   Manual: supine for mob with movement L hip, with lateral femoral head distraction combined with L hip flex, as well as ER R sidelying for muscle energy to improve mobility L SI jt line, with manual resistance to posteriorly rotate pelvis, 5 bouts Sidelying R for deep massage L post hip musculature  04/12/23 Therapeutic Exercise: to improve strength and mobility.  Demo, verbal and tactile cues throughout for technique. Nustep L5x81min UE/LE Functional squats 2 x 10  Standing hip abduction 2# x 10 BLE Standing hip extension 2# x 10 BLE Standing marching 2# x 10 BLE Step downs 4' x 15 RLE  Supine trunk rotation x 10  Supine HS curls with green pball x 20  04/09/23 Therapeutic Exercise: to improve strength and mobility.  Demo, verbal and tactile cues throughout for technique.  Bike L3x88min Fitter press BLE x 10  Side steps RTB at ankles 2x33ft Monster walk RTB at ankles 2x46ft Therapeutic Activity: L hip AROM and quad strength assessed : 1,125 ft no rest, no hip pain just tight on anterior thigh  04/06/23 Manual Therapy: Supine L PROM hip each movement  Long leg distraction LLE STM to L hip flexors and quads TherEx: LTR both ways x 10  Femoral nerve glide 2 x 10 / Prone knee bend Nustep L5x61min LE only Step ups 8' x 10 BLE Lateral step ups 8' x 10 BLE Standing marching x 6 BLE on UE support  04/01/23: Manual :  Supine for mobilization with movement, lateral/ inf distraction of L femoral head, for L hip flexion, 3 bouts  Recumbent bicycle:   level 2 x 5 min BATCA Knee extension 15# B LE's up, L LE down( eccentric quads) BATCA B knee flexion, L LE up (eccentric hamstrings) 35#, 3 x15 Standing L hip ext 3# cuff wt , 20x  Standing L hip abd 3# cuff wt, 20x Standing L hip flexion 3# cuff wt 20x Leg press, 25#,  B LE's 3 x 10   PATIENT EDUCATION:  Education details: HEP- hamstring stretch Person educated: Patient Education method: Explanation, Demonstration, and Tactile cues Education comprehension: verbalized understanding, returned demonstration, and verbal cues required  HOME EXERCISE PROGRAM: Provided written directions for the theraband hip abd, advised to perform 2 x day x 15 reps   Access Code: [email or web address] URL: [email or web address] Date: 03/19/2023 Prepared by: Verta Ellen  Exercises - Seated Hamstring Stretch  - 2 x daily - 7 x weekly - 3 sets - 3 reps - 30 sec hold   ASSESSMENT:  CLINICAL IMPRESSION: Patient reports good relief from stretching and MT last visit. Continued with similar manual techniques today for pain relief with good response. Pt reports he will see Dr. Roda Shutters on 12/17 for second opinion and will look into getting an injection for his L hip, as he does not want to have surgery. Pt continues to benefit from manual joint mobs, stretching, and TE to improve function. OBJECTIVE IMPAIRMENTS: Abnormal gait, decreased activity tolerance, decreased endurance, difficulty walking, decreased ROM, decreased strength, hypomobility, and pain.   ACTIVITY LIMITATIONS: carrying, lifting, bending, squatting, sleeping, stairs, and locomotion level  PARTICIPATION LIMITATIONS: laundry, driving, shopping, community activity, and yard work  PERSONAL FACTORS: Age, Behavior pattern, Past/current experiences, Time since onset of injury/illness/exacerbation, and 1-2 comorbidities: h/o prostate ca, h/o rotator cuff repair  are also affecting patient's functional outcome.   REHAB POTENTIAL: Good  CLINICAL DECISION MAKING: Stable/uncomplicated  EVALUATION COMPLEXITY: Low   GOALS: Goals reviewed with patient? Yes  SHORT TERM GOALS: Target date: 03/18/23  I HEP Baseline: Goal status: MET   LONG TERM GOALS: Target date: 04/29/23  Modified oswestry decrease from 20/50 to  10/50 Baseline:  Goal status: PROGRESSING  2.  Improve L hip ROM for flexion to 115 and IR to 10 from 110 and 3 for impoved standing tolerance, gait  mechanics Baseline:  Goal status: - met for IR (04/09/23) progressing with flexion  3.  Able to complete 6 min walk test without L thigh/post hip pain for improved function  Baseline: pain with all gait Goal status: MET- 04/09/23  4.  Improve L quads strength to 4+/5 for improved ability to descend hills, walk Baseline:  Goal status: PROGRESSING- 04/09/23  PLAN:  PT FREQUENCY: 2x/week  PT DURATION: 8 weeks  PLANNED INTERVENTIONS: 97110-Therapeutic exercises, 97530- Therapeutic activity, 97112- Neuromuscular re-education, 97535- Self Care, 21308- Manual therapy, and 97116- Gait training.  PLAN FOR NEXT SESSION: continue with manual techniques, he is to complete PT in the next 2 weeks.  Darleene Cleaver, PTA 04/22/2023, 2:08 PM  [facility] [address]  [address] [place], [place], [address] Phone: [phone]   Fax:  [phone]

## 2023-04-27 ENCOUNTER — Other Ambulatory Visit: Payer: Self-pay

## 2023-04-27 ENCOUNTER — Ambulatory Visit: Payer: Medicare Other

## 2023-04-27 DIAGNOSIS — M5386 Other specified dorsopathies, lumbar region: Secondary | ICD-10-CM | POA: Diagnosis not present

## 2023-04-27 DIAGNOSIS — R262 Difficulty in walking, not elsewhere classified: Secondary | ICD-10-CM

## 2023-04-27 DIAGNOSIS — M5432 Sciatica, left side: Secondary | ICD-10-CM

## 2023-04-27 DIAGNOSIS — M25652 Stiffness of left hip, not elsewhere classified: Secondary | ICD-10-CM | POA: Diagnosis not present

## 2023-04-27 NOTE — Therapy (Signed)
OUTPATIENT PHYSICAL THERAPY THORACOLUMBAR TREATMENT       Patient Name: Manuel Landry MRN: [id number] DOB:13-Oct-1945, 77 y.o., male Today's Date: 04/27/2023  END OF SESSION:  PT End of Session - 04/27/23 0838     Visit Number 14    Date for PT Re-Evaluation 04/29/23    Progress Note Due on Visit 20    PT Start Time 0845    PT Stop Time 0930    PT Time Calculation (min) 45 min    Activity Tolerance Patient tolerated treatment well    Behavior During Therapy WFL for tasks assessed/performed                     Past Medical History:  Diagnosis Date   Allergy    Anxiety    Arthritis    Blind loop syndrome    Bulging discs    LOWER BACK   Cataract    "beginnings of"   Diverticulosis    Family history of malignant neoplasm of gastrointestinal tract    History of adenomatous polyp of colon    History of Barrett's esophagus    History of chronic gastritis    Hyperlipidemia    no meds taken   IBS (irritable bowel syndrome)    Internal hemorrhoids    Malignant neoplasm of prostate [facility]rologist-  dr dahlstedt/  oncologist-  dr Kathrynn Running   dx 08-09-2017-- Stage T2a, Gleason 4+5, PSA 3.22 (on finateride)--- treatment ADT and external beam radiation   PONV (postoperative nausea and vomiting)    Seasonal allergic rhinitis    Tubular adenoma of colon    Wears glasses    Past Surgical History:  Procedure Laterality Date   COLONOSCOPY  last one 10-21-2016  dr Rhea Belton   CYSTOSCOPY WITH INSERTION OF UROLIFT N/A 12/15/2017   Procedure: CYSTOSCOPY WITH INSERTION OF UROLIFT, BIOPSY  PROSTATIC URETHAL;  Surgeon: Marcine Matar, MD;  Location: [facility];  Service: Urology;  Laterality: N/A;   ESOPHAGOGASTRODUODENOSCOPY (EGD) WITH PROPOFOL  last one 09-26-2013   dr pyrtle   INGUINAL HERNIA REPAIR Bilateral right 10-25-1997:  left 09-28-2008 (dr m. Daphine Deutscher @ [facility])   LAPAROSCOPIC CHOLECYSTECTOMY  2012   MANDIBLE SURGERY Bilateral 1990s   removal  calcified bone growths   ROTATOR CUFF REPAIR Right 03-26-2009   dr Shelle Iron  [facility]   SHOULDER OPEN ROTATOR CUFF REPAIR  03/17/2012   Procedure: ROTATOR CUFF REPAIR SHOULDER OPEN;  Surgeon: Javier Docker, MD;  Location: [facility];  Service: Orthopedics;  Laterality: Left;  LEFT MINI OPEN ROTATOR CUFF REPAIR    SPACE OAR INSTILLATION N/A 12/15/2017   Procedure: SPACE OAR INSTILLATION;  Surgeon: Marcine Matar, MD;  Location: [facility];  Service: Urology;  Laterality: N/A;   TONSILLECTOMY  child   UPPER GASTROINTESTINAL ENDOSCOPY     Patient Active Problem List   Diagnosis Date Noted   Elevated blood pressure reading 06/11/2022   Seasonal allergies 03/03/2022   Chronic rhinitis 03/06/2021   Nocturnal muscle cramp 09/02/2020   GAD (generalized anxiety disorder) 09/02/2020   Closed nondisplaced fracture of body of left scapula 04/15/2020   Situational anxiety 12/27/2018   Trigger finger of left thumb 11/17/2018   Hyperlipidemia 01/27/2017   Cardiovascular risk factor 08/12/2011   S/P cholecystectomy 06/22/2011   History of colonic polyps 06/22/2011   FLATULENCE-GAS-BLOATING 02/13/2010   Calculus of gallbladder 02/11/2010   GERD 02/10/2010   Diverticulosis of colon 02/10/2010   Nausea 02/10/2010   ABDOMINAL PAIN  RIGHT UPPER QUADRANT 02/10/2010   ABDOMINAL PAIN, EPIGASTRIC 02/10/2010   ANEMIA-IRON DEFICIENCY 07/17/2008   Inguinal hernia 07/17/2008   IRRITABLE BOWEL SYNDROME 07/17/2008   Gastritis and gastroduodenitis 07/11/2008   Duodenitis 07/11/2008   RENAL CYST, RIGHT 07/11/2008   DYSPEPSIA 06/12/2008   Constipation 05/31/2008   Backache 05/31/2008   BENIGN PROSTATIC HYPERTROPHY, MILD, HX OF 05/31/2008    PCP: Arva Chafe , DO  REFERRING PROVIDER: same  REFERRING DIAG: L sciatic pain  Rationale for Evaluation and Treatment: Rehabilitation  THERAPY DIAG:  Sciatica, left side  Difficulty in walking, not elsewhere classified  Stiffness of left hip,  not elsewhere classified  Decreased ROM of lumbar spine  ONSET DATE: 3 weeks, Sept 28 2024  SUBJECTIVE:                                                                                                                                                                                           SUBJECTIVE STATEMENT: Pt reports his pain ebbs and flows, had one bad night a couple of days  PERTINENT HISTORY:  Patient has gone to his physician 2 x , taking ibuprofen and muscle relaxers without much relief.  Referred to PT for eval and treat  PAIN:  Are you having pain? Yes: NPRS scale: 3/10 Pain location: L ant thigh into L lat hip and L SI jt Pain description: cramp that wont let up Aggravating factors: trying to walk, walking dog  Relieving factors: ice, TENS, med, minimal relief  PRECAUTIONS: None  RED FLAGS: None   WEIGHT BEARING RESTRICTIONS: No  FALLS:  Has patient fallen in last 6 months? No  LIVING ENVIRONMENT: Lives with: lives with their spouse Lives in: House/apartment Stairs: No Has following equipment at home: Single point cane  OCCUPATION: retired  PLOF: Independent  PATIENT GOALS: get rid of pain, be able to walk dog  NEXT MD VISIT: unclear  OBJECTIVE:  Note: Objective measures were completed at Evaluation unless otherwise noted.  DIAGNOSTIC FINDINGS:  none  PATIENT SURVEYS:  Modified Oswestry 20/50   SCREENING FOR RED FLAGS: Bowel or bladder incontinence: No Spinal tumors: No Cauda equina syndrome: No Compression fracture: No Abdominal aneurysm: No  COGNITION: Overall cognitive status: Within functional limits for tasks assessed     SENSATION: Reports radicular pain L thigh, no numbness, neural Sx  MUSCLE LENGTH: Hamstrings: Right wfl deg; Left wfl deg [name] test: Right -5 deg; Left -15 deg  POSTURE: flexed trunk  and loss of lumbar lordosis and loss of gluteal mass B  PALPATION: Pt tender proximal L quads, L TFL prox attachment L glut  medius and L glut  maximus Pain with PA segmental testing into L SI jt at L4/5 and L5/S1  LUMBAR ROM:   AROM eval  Flexion 70  Extension 50 P! L SI  Right lateral flexion 50  Left lateral flexion 50 p! L SI  Right rotation   Left rotation    (Blank rows = not tested)  LOWER EXTREMITY ROM:     Passive  Right eval Left eval L 04/09/23  Hip flexion 125 110 105  Hip extension -5 -15   Hip abduction     Hip adduction     Hip internal rotation 12 3 20   Hip external rotation -20 -15   Knee flexion     Knee extension     Ankle dorsiflexion     Ankle plantarflexion     Ankle inversion     Ankle eversion      (Blank rows = wfl)  LOWER EXTREMITY MMT:    MMT Right eval Left eval L 04/09/23  Hip flexion  P! 3-   Hip extension     Hip abduction  5   Hip adduction     Hip internal rotation     Hip external rotation  5   Knee flexion     Knee extension  4-/5 P! 4/5  no pain  Ankle dorsiflexion 4 4   Ankle plantarflexion 4 4   Ankle inversion     Ankle eversion      (Blank rows = wfl)  LUMBAR SPECIAL TESTS:  Straight leg raise test: Negative and FABER test: Negative  FUNCTIONAL TESTS:  30 sec sit to stand TBD   6 min walk test  unable today, antalgic/painful gait  GAIT: Distance walked: 56' Assistive device utilized: None Level of assistance: Complete Independence Comments: antalgic on L, decreased stance time on L Also noted L hip remains in flexed position to 15 degrees in supine  TODAY'S TREATMENT:                                                                                                                              DATE:04/27/23: Manual techniques to improve soft tissue tightness and pain and joint mobility L hip, lumbar region, and L SI jt. : Nustep, LE's only for 5 min for tissue perfusion Supine for mobilization with movement L hip for flexion motion, 3 bouts, 7 to 10 reps each , one bout L hip ER Prone for L hip distraction and pull, 3 reps to  improve L hip jt spacing. L side lying for massage gun, L post hip/gluteals L side lying for muscle energy technique to improve L SI jt line mobility, therapist providing L ilium force into forward flexion and L sidebending, pt providing engagement of SI musculature for contract/ relax technique and to improve SI mobility.   04/22/23 Nustep L5x72min Manual Therapy: supine for mob with movement L hip, with lateral femoral head distraction combined with L hip flex R sidelying for muscle energy  to improve mobility L SI jt line, with manual resistance to posteriorly rotate pelvis S/L for deep massage L hip rotators, glute med, glute max  04/20/23 Therex: nustep 5 min level 5, UE and Les' Instructed pt in R side lying stretch for L SI jt line, lower L lumbar spine   Manual: supine for mob with movement L hip, with lateral femoral head distraction combined with L hip flex, as well as ER R sidelying for muscle energy to improve mobility L SI jt line, with manual resistance to posteriorly rotate pelvis, 5 bouts Sidelying R for deep massage L post hip musculature   04/12/23 Therapeutic Exercise: to improve strength and mobility.  Demo, verbal and tactile cues throughout for technique. Nustep L5x25min UE/LE Functional squats 2 x 10  Standing hip abduction 2# x 10 BLE Standing hip extension 2# x 10 BLE Standing marching 2# x 10 BLE Step downs 4' x 15 RLE  Supine trunk rotation x 10  Supine HS curls with green pball x 20  04/09/23 Therapeutic Exercise: to improve strength and mobility.  Demo, verbal and tactile cues throughout for technique.  Bike L3x70min Fitter press BLE x 10  Side steps RTB at ankles 2x61ft Monster walk RTB at ankles 2x53ft Therapeutic Activity: L hip AROM and quad strength assessed : 1,125 ft no rest, no hip pain just tight on anterior thigh  04/06/23 Manual Therapy: Supine L PROM hip each movement  Long leg distraction LLE STM to L hip flexors and  quads TherEx: LTR both ways x 10  Femoral nerve glide 2 x 10 / Prone knee bend Nustep L5x64min LE only Step ups 8' x 10 BLE Lateral step ups 8' x 10 BLE Standing marching x 6 BLE on UE support  04/01/23: Manual :  Supine for mobilization with movement, lateral/ inf distraction of L femoral head, for L hip flexion, 3 bouts  Recumbent bicycle:   level 2 x 5 min BATCA Knee extension 15# B LE's up, L LE down( eccentric quads) BATCA B knee flexion, L LE up (eccentric hamstrings) 35#, 3 x15 Standing L hip ext 3# cuff wt , 20x  Standing L hip abd 3# cuff wt, 20x Standing L hip flexion 3# cuff wt 20x Leg press, 25#, B LE's 3 x 10   PATIENT EDUCATION:  Education details: HEP- hamstring stretch Person educated: Patient Education method: Explanation, Demonstration, and Tactile cues Education comprehension: verbalized understanding, returned demonstration, and verbal cues required  HOME EXERCISE PROGRAM: Provided written directions for the theraband hip abd, advised to perform 2 x day x 15 reps   Access Code: [email or web address] URL: [email or web address] Date: 03/19/2023 Prepared by: Verta Ellen  Exercises - Seated Hamstring Stretch  - 2 x daily - 7 x weekly - 3 sets - 3 reps - 30 sec hold   ASSESSMENT:  CLINICAL IMPRESSION: Patient reports ongoing relief primarily with manual techniques. Still reports ebbs and flows regarding frequency and intensity of Sx, depends on his activity level, usually more painful after using step ladder.  Pt reports he will see Dr. Roda Shutters orthopedist,  on 12/17 for second opinion and will look into getting an injection for his L hip, as he does not want to have surgery. We discussed his PT plan today, his last scheduled visit is this Thursday, at this time he wishes to Dc on Thursday and be evaluated by Dr. Roda Shutters.   OBJECTIVE IMPAIRMENTS: Abnormal gait, decreased activity tolerance, decreased endurance, difficulty walking, decreased ROM, decreased strength,  hypomobility, and  pain.   ACTIVITY LIMITATIONS: carrying, lifting, bending, squatting, sleeping, stairs, and locomotion level  PARTICIPATION LIMITATIONS: laundry, driving, shopping, community activity, and yard work  PERSONAL FACTORS: Age, Behavior pattern, Past/current experiences, Time since onset of injury/illness/exacerbation, and 1-2 comorbidities: h/o prostate ca, h/o rotator cuff repair  are also affecting patient's functional outcome.   REHAB POTENTIAL: Good  CLINICAL DECISION MAKING: Stable/uncomplicated  EVALUATION COMPLEXITY: Low   GOALS: Goals reviewed with patient? Yes  SHORT TERM GOALS: Target date: 03/18/23  I HEP Baseline: Goal status: MET   LONG TERM GOALS: Target date: 04/29/23  Modified oswestry decrease from 20/50 to 10/50 Baseline:  Goal status: PROGRESSING  2.  Improve L hip ROM for flexion to 115 and IR to 10 from 110 and 3 for impoved standing tolerance, gait mechanics Baseline:  Goal status: - met for IR (04/09/23) progressing with flexion  3.  Able to complete 6 min walk test without L thigh/post hip pain for improved function  Baseline: pain with all gait Goal status: MET- 04/09/23  4.  Improve L quads strength to 4+/5 for improved ability to descend hills, walk Baseline:  Goal status: PROGRESSING- 04/09/23  PLAN:  PT FREQUENCY: 2x/week  PT DURATION: 8 weeks  PLANNED INTERVENTIONS: 97110-Therapeutic exercises, 97530- Therapeutic activity, 97112- Neuromuscular re-education, 97535- Self Care, 40981- Manual therapy, and 97116- Gait training.  PLAN FOR NEXT SESSION: continue with manual techniques, he is to complete PT  Adonnis Salceda L Genee Rann, PT, DPT, OCS 04/27/2023, 9:56 AM  [facility] [address]  [address] [place], [place], [address] Phone: [phone]   Fax:  [phone]

## 2023-04-29 ENCOUNTER — Other Ambulatory Visit: Payer: Self-pay

## 2023-04-29 ENCOUNTER — Ambulatory Visit: Payer: Medicare Other

## 2023-04-29 DIAGNOSIS — M5386 Other specified dorsopathies, lumbar region: Secondary | ICD-10-CM | POA: Diagnosis not present

## 2023-04-29 DIAGNOSIS — M5432 Sciatica, left side: Secondary | ICD-10-CM

## 2023-04-29 DIAGNOSIS — R262 Difficulty in walking, not elsewhere classified: Secondary | ICD-10-CM

## 2023-04-29 DIAGNOSIS — M25652 Stiffness of left hip, not elsewhere classified: Secondary | ICD-10-CM | POA: Diagnosis not present

## 2023-04-29 NOTE — Therapy (Signed)
OUTPATIENT PHYSICAL THERAPY THORACOLUMBAR TREATMENT       Patient Name: Manuel Landry MRN: [id number] DOB:[birth year], 77 y.o., male Today's Date: 04/29/2023  END OF SESSION:  PT End of Session - 04/29/23 1520     Visit Number 15    Date for PT Re-Evaluation 04/29/23    Progress Note Due on Visit 20    PT Start Time 1438    PT Stop Time 1520    PT Time Calculation (min) 42 min    Activity Tolerance Patient tolerated treatment well    Behavior During Therapy WFL for tasks assessed/performed                      Past Medical History:  Diagnosis Date   Allergy    Anxiety    Arthritis    Blind loop syndrome    Bulging discs    LOWER BACK   Cataract    "beginnings of"   Diverticulosis    Family history of malignant neoplasm of gastrointestinal tract    History of adenomatous polyp of colon    History of Barrett's esophagus    History of chronic gastritis    Hyperlipidemia    no meds taken   IBS (irritable bowel syndrome)    Internal hemorrhoids    Malignant neoplasm of prostate [facility]) urologist-  dr dahlstedt/  oncologist-  dr Kathrynn Running   dx 08-09-2017-- Stage T2a, Gleason 4+5, PSA 3.22 (on finateride)--- treatment ADT and external beam radiation   PONV (postoperative nausea and vomiting)    Seasonal allergic rhinitis    Tubular adenoma of colon    Wears glasses    Past Surgical History:  Procedure Laterality Date   COLONOSCOPY  last one 10-21-2016  dr Rhea Belton   CYSTOSCOPY WITH INSERTION OF UROLIFT N/A 12/15/2017   Procedure: CYSTOSCOPY WITH INSERTION OF UROLIFT, BIOPSY  PROSTATIC URETHAL;  Surgeon: Marcine Matar, MD;  Location: [facility];  Service: Urology;  Laterality: N/A;   ESOPHAGOGASTRODUODENOSCOPY (EGD) WITH PROPOFOL  last one 09-26-2013   dr pyrtle   INGUINAL HERNIA REPAIR Bilateral right 10-25-1997:  left 09-28-2008 (dr m. Daphine Deutscher @ [facility])   LAPAROSCOPIC CHOLECYSTECTOMY  2012   MANDIBLE SURGERY Bilateral 1990s   removal  calcified bone growths   ROTATOR CUFF REPAIR Right 03-26-2009   dr Shelle Iron  [facility]   SHOULDER OPEN ROTATOR CUFF REPAIR  03/17/2012   Procedure: ROTATOR CUFF REPAIR SHOULDER OPEN;  Surgeon: Javier Docker, MD;  Location: [facility];  Service: Orthopedics;  Laterality: Left;  LEFT MINI OPEN ROTATOR CUFF REPAIR    SPACE OAR INSTILLATION N/A 12/15/2017   Procedure: SPACE OAR INSTILLATION;  Surgeon: Marcine Matar, MD;  Location: [facility];  Service: Urology;  Laterality: N/A;   TONSILLECTOMY  child   UPPER GASTROINTESTINAL ENDOSCOPY     Patient Active Problem List   Diagnosis Date Noted   Elevated blood pressure reading 06/11/2022   Seasonal allergies 03/03/2022   Chronic rhinitis 03/06/2021   Nocturnal muscle cramp 09/02/2020   GAD (generalized anxiety disorder) 09/02/2020   Closed nondisplaced fracture of body of left scapula 04/15/2020   Situational anxiety 12/27/2018   Trigger finger of left thumb 11/17/2018   Hyperlipidemia 01/27/2017   Cardiovascular risk factor 08/12/2011   S/P cholecystectomy 06/22/2011   History of colonic polyps 06/22/2011   FLATULENCE-GAS-BLOATING 02/13/2010   Calculus of gallbladder 02/11/2010   GERD 02/10/2010   Diverticulosis of colon 02/10/2010   Nausea 02/10/2010   ABDOMINAL  PAIN RIGHT UPPER QUADRANT 02/10/2010   ABDOMINAL PAIN, EPIGASTRIC 02/10/2010   ANEMIA-IRON DEFICIENCY 07/17/2008   Inguinal hernia 07/17/2008   IRRITABLE BOWEL SYNDROME 07/17/2008   Gastritis and gastroduodenitis 07/11/2008   Duodenitis 07/11/2008   RENAL CYST, RIGHT 07/11/2008   DYSPEPSIA 06/12/2008   Constipation 05/31/2008   Backache 05/31/2008   BENIGN PROSTATIC HYPERTROPHY, MILD, HX OF 05/31/2008    PCP: Arva Chafe , DO  REFERRING PROVIDER: same  REFERRING DIAG: L sciatic pain  Rationale for Evaluation and Treatment: Rehabilitation  THERAPY DIAG:  Sciatica, left side  Difficulty in walking, not elsewhere classified  Stiffness of left hip,  not elsewhere classified  Decreased ROM of lumbar spine  ONSET DATE: 3 weeks, Sept 28 2024  SUBJECTIVE:                                                                                                                                                                                           SUBJECTIVE STATEMENT: Pt reports much improved after the new maneuver of prone manual distraction L hip last visit, with better posture and long lasting results, nearly 48 hrsPERTINENT HISTORY:  Patient has gone to his physician 2 x , taking ibuprofen and muscle relaxers without much relief.  Referred to PT for eval and treat  PAIN:  Are you having pain? Yes: NPRS scale: 3/10 Pain location: L ant thigh into L lat hip and L SI jt Pain description: cramp that wont let up Aggravating factors: trying to walk, walking dog  Relieving factors: ice, TENS, med, minimal relief  PRECAUTIONS: None  RED FLAGS: None   WEIGHT BEARING RESTRICTIONS: No  FALLS:  Has patient fallen in last 6 months? No  LIVING ENVIRONMENT: Lives with: lives with their spouse Lives in: House/apartment Stairs: No Has following equipment at home: Single point cane  OCCUPATION: retired  PLOF: Independent  PATIENT GOALS: get rid of pain, be able to walk dog  NEXT MD VISIT: unclear  OBJECTIVE:  Note: Objective measures were completed at Evaluation unless otherwise noted.  DIAGNOSTIC FINDINGS:  none  PATIENT SURVEYS:  Modified Oswestry 20/50   SCREENING FOR RED FLAGS: Bowel or bladder incontinence: No Spinal tumors: No Cauda equina syndrome: No Compression fracture: No Abdominal aneurysm: No  COGNITION: Overall cognitive status: Within functional limits for tasks assessed     SENSATION: Reports radicular pain L thigh, no numbness, neural Sx  MUSCLE LENGTH: Hamstrings: Right wfl deg; Left wfl deg [name] test: Right -5 deg; Left -15 deg  POSTURE: flexed trunk  and loss of lumbar lordosis and loss of gluteal  mass B  PALPATION: Pt tender proximal L quads,  L TFL prox attachment L glut medius and L glut maximus Pain with PA segmental testing into L SI jt at L4/5 and L5/S1  LUMBAR ROM:   AROM eval  Flexion 70  Extension 50 P! L SI  Right lateral flexion 50  Left lateral flexion 50 p! L SI  Right rotation   Left rotation    (Blank rows = not tested)  LOWER EXTREMITY ROM:     Passive  Right eval Left eval L 04/09/23  Hip flexion 125 110 105  Hip extension -5 -15   Hip abduction     Hip adduction     Hip internal rotation 12 3 20   Hip external rotation -20 -15   Knee flexion     Knee extension     Ankle dorsiflexion     Ankle plantarflexion     Ankle inversion     Ankle eversion      (Blank rows = wfl)  LOWER EXTREMITY MMT:    MMT Right eval Left eval L 04/09/23  Hip flexion  P! 3-   Hip extension     Hip abduction  5   Hip adduction     Hip internal rotation     Hip external rotation  5   Knee flexion     Knee extension  4-/5 P! 4/5  no pain  Ankle dorsiflexion 4 4   Ankle plantarflexion 4 4   Ankle inversion     Ankle eversion      (Blank rows = wfl)  LUMBAR SPECIAL TESTS:  Straight leg raise test: Negative and FABER test: Negative  FUNCTIONAL TESTS:  30 sec sit to stand TBD   6 min walk test  unable today, antalgic/painful gait  GAIT: Distance walked: 16' Assistive device utilized: None Level of assistance: Complete Independence Comments: antalgic on L, decreased stance time on L Also noted L hip remains in flexed position to 15 degrees in supine  TODAY'S TREATMENT:                                                                                                                              DATE:04/29/23:  Manual:  Utilized nustep 5 min LE's only for increased tissue perfusion LE's Supine for mobilization with movement L hip for flexion motion, 3 bouts, 7 to 10 reps each  L side lying for muscle energy technique to improve L SI jt line mobility,  therapist providing L ilium force into forward flexion and L sidebending, pt providing engagement of SI musculature for contract/ relax technique and to improve SI mobility.  L side lying for massage gun, L post hip/gluteals Prone for L hip distraction and pull, 3 reps to improve L hip jt spacing.  04/27/23: Manual techniques to improve soft tissue tightness and pain and joint mobility L hip, lumbar region, and L SI jt. : Nustep, LE's only for 5 min for tissue perfusion Supine for mobilization with movement L hip  for flexion motion, 3 bouts, 7 to 10 reps each , one bout L hip ER Prone for L hip distraction and pull, 3 reps to improve L hip jt spacing. L side lying for massage gun, L post hip/gluteals L side lying for muscle energy technique to improve L SI jt line mobility, therapist providing L ilium force into forward flexion and L sidebending, pt providing engagement of SI musculature for contract/ relax technique and to improve SI mobility.   04/22/23 Nustep L5x66min Manual Therapy: supine for mob with movement L hip, with lateral femoral head distraction combined with L hip flex R sidelying for muscle energy to improve mobility L SI jt line, with manual resistance to posteriorly rotate pelvis S/L for deep massage L hip rotators, glute med, glute max  04/20/23 Therex: nustep 5 min level 5, UE and Les' Instructed pt in R side lying stretch for L SI jt line, lower L lumbar spine   Manual: supine for mob with movement L hip, with lateral femoral head distraction combined with L hip flex, as well as ER R sidelying for muscle energy to improve mobility L SI jt line, with manual resistance to posteriorly rotate pelvis, 5 bouts Sidelying R for deep massage L post hip musculature   04/12/23 Therapeutic Exercise: to improve strength and mobility.  Demo, verbal and tactile cues throughout for technique. Nustep L5x71min UE/LE Functional squats 2 x 10  Standing hip abduction 2# x 10 BLE Standing  hip extension 2# x 10 BLE Standing marching 2# x 10 BLE Step downs 4' x 15 RLE  Supine trunk rotation x 10  Supine HS curls with green pball x 20  04/09/23 Therapeutic Exercise: to improve strength and mobility.  Demo, verbal and tactile cues throughout for technique.  Bike L3x90min Fitter press BLE x 10  Side steps RTB at ankles 2x49ft Monster walk RTB at ankles 2x48ft Therapeutic Activity: L hip AROM and quad strength assessed : 1,125 ft no rest, no hip pain just tight on anterior thigh  04/06/23 Manual Therapy: Supine L PROM hip each movement  Long leg distraction LLE STM to L hip flexors and quads TherEx: LTR both ways x 10  Femoral nerve glide 2 x 10 / Prone knee bend Nustep L5x77min LE only Step ups 8' x 10 BLE Lateral step ups 8' x 10 BLE Standing marching x 6 BLE on UE support  04/01/23: Manual :  Supine for mobilization with movement, lateral/ inf distraction of L femoral head, for L hip flexion, 3 bouts  Recumbent bicycle:   level 2 x 5 min BATCA Knee extension 15# B LE's up, L LE down( eccentric quads) BATCA B knee flexion, L LE up (eccentric hamstrings) 35#, 3 x15 Standing L hip ext 3# cuff wt , 20x  Standing L hip abd 3# cuff wt, 20x Standing L hip flexion 3# cuff wt 20x Leg press, 25#, B LE's 3 x 10   PATIENT EDUCATION:  Education details: HEP- hamstring stretch Person educated: Patient Education method: Explanation, Demonstration, and Tactile cues Education comprehension: verbalized understanding, returned demonstration, and verbal cues required  HOME EXERCISE PROGRAM: Provided written directions for the theraband hip abd, advised to perform 2 x day x 15 reps   Access Code: [email or web address] URL: [email or web address] Date: 03/19/2023 Prepared by: Verta Ellen  Exercises - Seated Hamstring Stretch  - 2 x daily - 7 x weekly - 3 sets - 3 reps - 30 sec hold   ASSESSMENT:  CLINICAL IMPRESSION: Patient reports ongoing relief  primarily  with manual techniques. Much improved after prone hip longitudinal distraction after last treatment and requests the same technique again.  Pt reports he will see Dr. Roda Shutters orthopedist,  on 12/17 for second opinion and will look into getting an injection for his L hip, as he does not want to have surgery. We discussed his PT plan today, we decided to place the pt  on hold at this time pending his appt with Dr Roda Shutters next week.  OBJECTIVE IMPAIRMENTS: Abnormal gait, decreased activity tolerance, decreased endurance, difficulty walking, decreased ROM, decreased strength, hypomobility, and pain.   ACTIVITY LIMITATIONS: carrying, lifting, bending, squatting, sleeping, stairs, and locomotion level  PARTICIPATION LIMITATIONS: laundry, driving, shopping, community activity, and yard work  PERSONAL FACTORS: Age, Behavior pattern, Past/current experiences, Time since onset of injury/illness/exacerbation, and 1-2 comorbidities: h/o prostate ca, h/o rotator cuff repair  are also affecting patient's functional outcome.   REHAB POTENTIAL: Good  CLINICAL DECISION MAKING: Stable/uncomplicated  EVALUATION COMPLEXITY: Low   GOALS: Goals reviewed with patient? Yes  SHORT TERM GOALS: Target date: 03/18/23  I HEP Baseline: Goal status: MET   LONG TERM GOALS: Target date: 04/29/23  Modified oswestry decrease from 20/50 to 10/50 Baseline:  Goal status: PROGRESSING  2.  Improve L hip ROM for flexion to 115 and IR to 10 from 110 and 3 for impoved standing tolerance, gait mechanics Baseline:  Goal status: - met for IR (04/09/23) progressing with flexion  3.  Able to complete 6 min walk test without L thigh/post hip pain for improved function  Baseline: pain with all gait Goal status: MET- 04/09/23  4.  Improve L quads strength to 4+/5 for improved ability to descend hills, walk Baseline:  Goal status: PROGRESSING- 04/09/23  PLAN:  PT FREQUENCY: 2x/week  PT DURATION: 8 weeks  PLANNED INTERVENTIONS:  97110-Therapeutic exercises, 97530- Therapeutic activity, 97112- Neuromuscular re-education, 97535- Self Care, 53664- Manual therapy, and 97116- Gait training.  PLAN FOR NEXT SESSION: hold at this time  Early Chars, PT, DPT, OCS 04/29/2023, 3:27 PM  [facility] [address]  [address] [place], [place], [address] Phone: [phone]   Fax:  [phone]

## 2023-05-04 ENCOUNTER — Ambulatory Visit (INDEPENDENT_AMBULATORY_CARE_PROVIDER_SITE_OTHER): Payer: Medicare Other | Admitting: Orthopaedic Surgery

## 2023-05-04 DIAGNOSIS — M1612 Unilateral primary osteoarthritis, left hip: Secondary | ICD-10-CM | POA: Diagnosis not present

## 2023-05-04 MED ORDER — LIDOCAINE HCL 1 % IJ SOLN
3.0000 mL | INTRAMUSCULAR | Status: AC | PRN
  Administered 2023-05-04: 3 mL

## 2023-05-04 MED ORDER — METHYLPREDNISOLONE ACETATE 40 MG/ML IJ SUSP
40.0000 mg | INTRAMUSCULAR | Status: AC | PRN
  Administered 2023-05-04: 40 mg via INTRA_ARTICULAR

## 2023-05-04 MED ORDER — BUPIVACAINE HCL 0.5 % IJ SOLN
3.0000 mL | INTRAMUSCULAR | Status: AC | PRN
  Administered 2023-05-04: 3 mL via INTRA_ARTICULAR

## 2023-05-04 NOTE — Progress Notes (Signed)
Office Visit Note   Patient: Manuel Landry           Date of Birth: June 13, 1945           MRN: [id number] Visit Date: 05/04/2023              Requested by: Manuel Dory, DO [address] [place],  [place] [address] PCP: Manuel Dory, DO   Assessment & Plan: Visit Diagnoses:  1. Primary osteoarthritis of left hip     Plan: Manuel Landry is a very pleasant 77 year old gentleman with end-stage left hip DJD.  X-rays and condition reviewed with the patient in detail and treatment options were reviewed.  Currently he is not able to do a hip replacement given his circumstances with his family.  He would like to try a hip cortisone injection to get him through these next few months.  He tolerated the injection well.  Hip replacement handout provided.  Follow-Up Instructions: No follow-ups on file.   Orders:  Orders Placed This Encounter  Procedures   Large Joint Inj: L hip joint   No orders of the defined types were placed in this encounter.     Procedures: Large Joint Inj: L hip joint on 05/04/2023 10:41 AM Indications: pain Details: 22 G needle, ultrasound-guided Medications: 3 mL lidocaine 1 %; 3 mL bupivacaine 0.5 %; 40 mg methylPREDNISolone acetate 40 MG/ML Outcome: tolerated well, no immediate complications Patient was prepped and draped in the usual sterile fashion.       Clinical Data: No additional findings.   Subjective: Chief Complaint  Patient presents with   Lower Back - Pain   Left Hip - Pain    HPI Manuel Landry is a 77 year old gentleman here for second opinion for chronic left hip pain.  [facility] hip DJD and surgery was recommended but conservative treatments were not really discussed.  He is here for a second opinion and to discuss nonsurgical options.  He states that he has thigh pain and buttock pain which affects some ADLs.  He has had this pain for about 2 months.   Denies any injuries.  He has been doing physical therapy for the hip and the back which seems to help slightly. Review of Systems  Constitutional: Negative.   HENT: Negative.    Eyes: Negative.   Respiratory: Negative.    Cardiovascular: Negative.   Gastrointestinal: Negative.   Endocrine: Negative.   Genitourinary: Negative.   Musculoskeletal:  Positive for arthralgias and gait problem.  Skin: Negative.   Allergic/Immunologic: Negative.   Hematological: Negative.   Psychiatric/Behavioral: Negative.    All other systems reviewed and are negative.    Objective: Vital Signs: There were no vitals taken for this visit.  Physical Exam Vitals and nursing note reviewed.  Constitutional:      Appearance: He is well-developed.  HENT:     Head: Normocephalic and atraumatic.  Eyes:     Pupils: Pupils are equal, round, and reactive to light.  Pulmonary:     Effort: Pulmonary effort is normal.  Abdominal:     Palpations: Abdomen is soft.  Musculoskeletal:        General: Normal range of motion.     Cervical back: Neck supple.  Skin:    General: Skin is warm.  Neurological:     Mental Status: He is alert and oriented to person, place, and time.  Psychiatric:  Behavior: Behavior normal.        Thought Content: Thought content normal.        Judgment: Judgment normal.     Ortho Exam Exam of the left hip shows limited range of motion.  Pain with FADIR and Stinchfield.  No trochanteric tenderness.  No sciatic tension signs. Specialty Comments:  No specialty comments available.  Imaging: X-rays in PACS independently reviewed and interpreted shows bone-on-bone joint space narrowing of the left hip joint.  There is remodeling of the femoral head and periarticular spurring and cystic formation.   PMFS History: Patient Active Problem List   Diagnosis Date Noted   Elevated blood pressure reading 06/11/2022   Seasonal allergies 03/03/2022   Chronic rhinitis 03/06/2021    Nocturnal muscle cramp 09/02/2020   GAD (generalized anxiety disorder) 09/02/2020   Closed nondisplaced fracture of body of left scapula 04/15/2020   Situational anxiety 12/27/2018   Trigger finger of left thumb 11/17/2018   Hyperlipidemia 01/27/2017   Cardiovascular risk factor 08/12/2011   S/P cholecystectomy 06/22/2011   History of colonic polyps 06/22/2011   FLATULENCE-GAS-BLOATING 02/13/2010   Calculus of gallbladder 02/11/2010   GERD 02/10/2010   Diverticulosis of colon 02/10/2010   Nausea 02/10/2010   ABDOMINAL PAIN RIGHT UPPER QUADRANT 02/10/2010   ABDOMINAL PAIN, EPIGASTRIC 02/10/2010   ANEMIA-IRON DEFICIENCY 07/17/2008   Inguinal hernia 07/17/2008   IRRITABLE BOWEL SYNDROME 07/17/2008   Gastritis and gastroduodenitis 07/11/2008   Duodenitis 07/11/2008   RENAL CYST, RIGHT 07/11/2008   DYSPEPSIA 06/12/2008   Constipation 05/31/2008   Backache 05/31/2008   BENIGN PROSTATIC HYPERTROPHY, MILD, HX OF 05/31/2008   Past Medical History:  Diagnosis Date   Allergy    Anxiety    Arthritis    Blind loop syndrome    Bulging discs    LOWER BACK   Cataract    "beginnings of"   Diverticulosis    Family history of malignant neoplasm of gastrointestinal tract    History of adenomatous polyp of colon    History of Barrett's esophagus    History of chronic gastritis    Hyperlipidemia    no meds taken   IBS (irritable bowel syndrome)    Internal hemorrhoids    Malignant neoplasm of prostate [facility]) urologist-  Manuel Landry/  oncologist-  Manuel Landry   dx 08-09-2017-- Stage T2a, Gleason 4+5, PSA 3.22 (on finateride)--- treatment ADT and external beam radiation   PONV (postoperative nausea and vomiting)    Seasonal allergic rhinitis    Tubular adenoma of colon    Wears glasses     Family History  Problem Relation Age of Onset   Colon cancer Mother 71   Cancer Mother    Diabetes Maternal Grandmother    COPD Sister    Heart failure Father 61   Esophageal cancer Neg Hx     Rectal cancer Neg Hx    Stomach cancer Neg Hx     Past Surgical History:  Procedure Laterality Date   COLONOSCOPY  last one 10-21-2016  Manuel Manuel Landry   CYSTOSCOPY WITH INSERTION OF UROLIFT N/A 12/15/2017   Procedure: CYSTOSCOPY WITH INSERTION OF UROLIFT, BIOPSY  PROSTATIC URETHAL;  Surgeon: Marcine Matar, MD;  [facility];  Service: Urology;  Laterality: N/A;   ESOPHAGOGASTRODUODENOSCOPY (EGD) WITH PROPOFOL  last one 09-26-2013   Manuel pyrtle   INGUINAL HERNIA REPAIR Bilateral right 10-25-1997:  left 09-28-2008 (Manuel m. Daphine Deutscher @ [facility])   LAPAROSCOPIC CHOLECYSTECTOMY  2012   MANDIBLE SURGERY Bilateral 1990s  removal calcified bone growths   ROTATOR CUFF REPAIR Right 03-26-2009   Manuel Shelle Iron  [facility]   SHOULDER OPEN ROTATOR CUFF REPAIR  03/17/2012   Procedure: ROTATOR CUFF REPAIR SHOULDER OPEN;  Surgeon: Javier Docker, MD;  Location: [facility];  Service: Orthopedics;  Laterality: Left;  LEFT MINI OPEN ROTATOR CUFF REPAIR    SPACE OAR INSTILLATION N/A 12/15/2017   Procedure: SPACE OAR INSTILLATION;  Surgeon: Marcine Matar, MD;  Location[facility];  Service: Urology;  Laterality: N/A;   TONSILLECTOMY  child   UPPER GASTROINTESTINAL ENDOSCOPY     Social History   Occupational History   Occupation: Retired    Associate Professor: RETIRED  Tobacco Use   Smoking status: Never   Smokeless tobacco: Never   Tobacco comments:    Never smoke 11/13/21  Vaping Use   Vaping status: Never Used  Substance and Sexual Activity   Alcohol use: Yes    Alcohol/week: 2.0 standard drinks of alcohol    Types: 2 Glasses of wine per week    Comment: occasional   Drug use: Never   Sexual activity: Not Currently    Birth control/protection: None    Comment: vasectomy 2011

## 2023-06-10 ENCOUNTER — Other Ambulatory Visit (HOSPITAL_BASED_OUTPATIENT_CLINIC_OR_DEPARTMENT_OTHER): Payer: Self-pay

## 2023-06-11 ENCOUNTER — Other Ambulatory Visit (HOSPITAL_BASED_OUTPATIENT_CLINIC_OR_DEPARTMENT_OTHER): Payer: Self-pay

## 2023-06-11 ENCOUNTER — Other Ambulatory Visit: Payer: Self-pay | Admitting: Family Medicine

## 2023-06-11 DIAGNOSIS — F418 Other specified anxiety disorders: Secondary | ICD-10-CM

## 2023-06-11 MED ORDER — SERTRALINE HCL 50 MG PO TABS
50.0000 mg | ORAL_TABLET | Freq: Every day | ORAL | 2 refills | Status: DC
  Filled 2023-06-11 – 2023-07-07 (×4): qty 90, 90d supply, fill #0
  Filled 2023-10-04: qty 90, 90d supply, fill #1
  Filled 2023-11-11 – 2023-12-20 (×3): qty 90, 90d supply, fill #2

## 2023-06-22 ENCOUNTER — Encounter: Payer: Self-pay | Admitting: Family Medicine

## 2023-06-25 ENCOUNTER — Encounter: Payer: Self-pay | Admitting: Orthopaedic Surgery

## 2023-06-25 ENCOUNTER — Other Ambulatory Visit (INDEPENDENT_AMBULATORY_CARE_PROVIDER_SITE_OTHER): Payer: Self-pay

## 2023-06-25 ENCOUNTER — Ambulatory Visit (INDEPENDENT_AMBULATORY_CARE_PROVIDER_SITE_OTHER): Payer: Medicare Other | Admitting: Orthopaedic Surgery

## 2023-06-25 DIAGNOSIS — M1612 Unilateral primary osteoarthritis, left hip: Secondary | ICD-10-CM | POA: Diagnosis not present

## 2023-06-25 NOTE — Progress Notes (Signed)
 Office Visit Note   Patient: Manuel Landry           Date of Birth: 1945-08-28           MRN: [id number] Visit Date: 06/25/2023              Requested by: Frann Mabel Mt, DO [address] [place],  [place] [address] PCP: Frann Mabel Mt, DO   Assessment & Plan: Visit Diagnoses:  1. Primary osteoarthritis of left hip     Plan: Impression is severe left hip degenerative joint disease secondary to Osteoarthritis.  Imaging shows bone on bone joint space narrowing.  At this point, conservative treatments fail to provide any significant relief and the pain is severely affecting ADLs and quality of life.  Based on treatment options, the patient has elected to move forward with a hip replacement.  We have discussed the surgical risks that include but are not limited to infection, DVT, leg length discrepancy, numbness, tingling, incomplete relief of pain.  Recovery and prognosis were also reviewed.    Current anticoagulants: No antithrombotic Postop anticoagulation: Aspirin  81 mg Diabetic: No  Prior DVT/PE: No Tobacco use: No Clearances needed for surgery: [place] PCP Anticipate discharge dispo: home  Marval will call the patient to confirm surgery date for sometime in July per his request.  He will make an appointment with the [facility] for another left hip injection in March.  Follow-Up Instructions: No follow-ups on file.   Orders:  Orders Placed This Encounter  Procedures   XR HIP UNILAT W OR W/O PELVIS 2-3 VIEWS LEFT   No orders of the defined types were placed in this encounter.     Procedures: No procedures performed   Clinical Data: No additional findings.   Subjective: Chief Complaint  Patient presents with   Left Hip - Pain    HPI Manuel Landry returns today to follow-up for repeat evaluation of left hip DJD.  He had a cortisone injection about 2 months ago which did partially help.   Review of Systems  Constitutional: Negative.    HENT: Negative.    Eyes: Negative.   Respiratory: Negative.    Cardiovascular: Negative.   Gastrointestinal: Negative.   Endocrine: Negative.   Genitourinary: Negative.   Skin: Negative.   Allergic/Immunologic: Negative.   Neurological: Negative.   Hematological: Negative.   Psychiatric/Behavioral: Negative.    All other systems reviewed and are negative.    Objective: Vital Signs: There were no vitals taken for this visit.  Physical Exam Vitals and nursing note reviewed.  Constitutional:      Appearance: He is well-developed.  Pulmonary:     Effort: Pulmonary effort is normal.  Abdominal:     Palpations: Abdomen is soft.  Skin:    General: Skin is warm.  Neurological:     Mental Status: He is alert and oriented to person, place, and time.  Psychiatric:        Behavior: Behavior normal.        Thought Content: Thought content normal.        Judgment: Judgment normal.     Ortho Exam Examination of the left hip is unchanged. Specialty Comments:  No specialty comments available.  Imaging: XR HIP UNILAT W OR W/O PELVIS 2-3 VIEWS LEFT Result Date: 06/25/2023 Advanced degenerative joint disease with bone-on-bone joint space narrowing of the left hip joint.    PMFS History: Patient Active Problem List   Diagnosis Date Noted   Elevated  blood pressure reading 06/11/2022   Seasonal allergies 03/03/2022   Chronic rhinitis 03/06/2021   Nocturnal muscle cramp 09/02/2020   GAD (generalized anxiety disorder) 09/02/2020   Closed nondisplaced fracture of body of left scapula 04/15/2020   Situational anxiety 12/27/2018   Trigger finger of left thumb 11/17/2018   Hyperlipidemia 01/27/2017   Cardiovascular risk factor 08/12/2011   S/P cholecystectomy 06/22/2011   History of colonic polyps 06/22/2011   FLATULENCE-GAS-BLOATING 02/13/2010   Calculus of gallbladder 02/11/2010   GERD 02/10/2010   Diverticulosis of colon 02/10/2010   Nausea 02/10/2010   ABDOMINAL PAIN  RIGHT UPPER QUADRANT 02/10/2010   ABDOMINAL PAIN, EPIGASTRIC 02/10/2010   ANEMIA-IRON DEFICIENCY 07/17/2008   Inguinal hernia 07/17/2008   IRRITABLE BOWEL SYNDROME 07/17/2008   Gastritis and gastroduodenitis 07/11/2008   Duodenitis 07/11/2008   RENAL CYST, RIGHT 07/11/2008   DYSPEPSIA 06/12/2008   Constipation 05/31/2008   Backache 05/31/2008   BENIGN PROSTATIC HYPERTROPHY, MILD, HX OF 05/31/2008   Past Medical History:  Diagnosis Date   Allergy    Anxiety    Arthritis    Blind loop syndrome    Bulging discs    LOWER BACK   Cataract    beginnings of   Diverticulosis    Family history of malignant neoplasm of gastrointestinal tract    History of adenomatous polyp of colon    History of Barrett's esophagus    History of chronic gastritis    Hyperlipidemia    no meds taken   IBS (irritable bowel syndrome)    Internal hemorrhoids    Malignant neoplasm of prostate [facility]) urologist-  dr dahlstedt/  oncologist-  dr patrcia   dx 08-09-2017-- Stage T2a, Gleason 4+5, PSA 3.22 (on finateride)--- treatment ADT and external beam radiation   PONV (postoperative nausea and vomiting)    Seasonal allergic rhinitis    Tubular adenoma of colon    Wears glasses     Family History  Problem Relation Age of Onset   Colon cancer Mother 27   Cancer Mother    Diabetes Maternal Grandmother    COPD Sister    Heart failure Father 42   Esophageal cancer Neg Hx    Rectal cancer Neg Hx    Stomach cancer Neg Hx     Past Surgical History:  Procedure Laterality Date   COLONOSCOPY  last one 10-21-2016  dr albertus   CYSTOSCOPY WITH INSERTION OF UROLIFT N/A 12/15/2017   Procedure: CYSTOSCOPY WITH INSERTION OF UROLIFT, BIOPSY  PROSTATIC URETHAL;  Surgeon: Matilda Senior, MD;  Location: [facility];  Service: Urology;  Laterality: N/A;   ESOPHAGOGASTRODUODENOSCOPY (EGD) WITH PROPOFOL   last one 09-26-2013   dr pyrtle   INGUINAL HERNIA REPAIR Bilateral right 10-25-1997:  left  09-28-2008 (dr m. gladis @ [facility])   LAPAROSCOPIC CHOLECYSTECTOMY  2012   MANDIBLE SURGERY Bilateral 1990s   removal calcified bone growths   ROTATOR CUFF REPAIR Right 03-26-2009   dr duwayne  [facility]   SHOULDER OPEN ROTATOR CUFF REPAIR  03/17/2012   Procedure: ROTATOR CUFF REPAIR SHOULDER OPEN;  Surgeon: Reyes JAYSON Duwayne, MD;  Location: [facility];  Service: Orthopedics;  Laterality: Left;  LEFT MINI OPEN ROTATOR CUFF REPAIR    SPACE OAR INSTILLATION N/A 12/15/2017   Procedure: SPACE OAR INSTILLATION;  Surgeon: Matilda Senior, MD;  Location: [facility];  Service: Urology;  Laterality: N/A;   TONSILLECTOMY  child   UPPER GASTROINTESTINAL ENDOSCOPY     Social History   Occupational History  Occupation: Retired    Associate Professor: RETIRED  Tobacco Use   Smoking status: Never   Smokeless tobacco: Never   Tobacco comments:    Never smoke 11/13/21  Vaping Use   Vaping status: Never Used  Substance and Sexual Activity   Alcohol  use: Yes    Alcohol /week: 2.0 standard drinks of alcohol     Types: 2 Glasses of wine per week    Comment: occasional   Drug use: Never   Sexual activity: Not Currently    Birth control/protection: None    Comment: vasectomy 2011

## 2023-06-30 ENCOUNTER — Ambulatory Visit: Payer: Medicare Other | Admitting: Family Medicine

## 2023-06-30 ENCOUNTER — Encounter: Payer: Self-pay | Admitting: Family Medicine

## 2023-06-30 VITALS — BP 130/82 | HR 82 | Temp 98.0°F | Resp 16 | Ht 71.0 in | Wt 188.0 lb

## 2023-06-30 DIAGNOSIS — Z01818 Encounter for other preprocedural examination: Secondary | ICD-10-CM

## 2023-06-30 NOTE — Progress Notes (Signed)
Subjective:   Chief Complaint  Patient presents with   Pre-op Exam    Pre-po paperwork    Manuel Landry  is here for a Pre-operative physical at the request of Dr. Roda Shutters.   He  is having L hip replacement surgery on hip arthroplasty for L hip OA.  Personal or family hx of adverse outcome to anesthesia? No  Chipped, cracked, missing, or loose teeth? No Decreased ROM of neck? No  Able to walk up 2 flights of stairs without becoming significantly short of breath or having chest pain? Yes   Revised Goldman Criteria: High Risk Surgery (intraperitoneal, intrathoracic, aortic): No  Ischemic heart disease (Prior MI, +excercise stress test, angina, nitrate use, Qwave): No  History of heart failure: No  History of cerebrovascular disease: No  History of diabetes: No  Insulin therapy for DM: No  Preoperative Cr >2.0: No   Revised Goldman Criteria - risk for major cardiac death No risk factors -- 0.4 percent One risk factor -- 1.0 percent  Two risk factors -- 2.4 percent  Three or more risk factors -- 5.4 percent   Patient Active Problem List   Diagnosis Date Noted   Elevated blood pressure reading 06/11/2022   Seasonal allergies 03/03/2022   Chronic rhinitis 03/06/2021   Nocturnal muscle cramp 09/02/2020   GAD (generalized anxiety disorder) 09/02/2020   Closed nondisplaced fracture of body of left scapula 04/15/2020   Situational anxiety 12/27/2018   Trigger finger of left thumb 11/17/2018   Hyperlipidemia 01/27/2017   Cardiovascular risk factor 08/12/2011   S/P cholecystectomy 06/22/2011   History of colonic polyps 06/22/2011   FLATULENCE-GAS-BLOATING 02/13/2010   Calculus of gallbladder 02/11/2010   GERD 02/10/2010   Diverticulosis of colon 02/10/2010   Nausea 02/10/2010   ABDOMINAL PAIN RIGHT UPPER QUADRANT 02/10/2010   ABDOMINAL PAIN, EPIGASTRIC 02/10/2010   ANEMIA-IRON DEFICIENCY 07/17/2008   Inguinal hernia 07/17/2008   IRRITABLE BOWEL SYNDROME 07/17/2008   Gastritis and  gastroduodenitis 07/11/2008   Duodenitis 07/11/2008   RENAL CYST, RIGHT 07/11/2008   DYSPEPSIA 06/12/2008   Constipation 05/31/2008   Backache 05/31/2008   BENIGN PROSTATIC HYPERTROPHY, MILD, HX OF 05/31/2008   Past Medical History:  Diagnosis Date   Allergy    Anxiety    Arthritis    Blind loop syndrome    Bulging discs    LOWER BACK   Cataract    "beginnings of"   Diverticulosis    Family history of malignant neoplasm of gastrointestinal tract    History of adenomatous polyp of colon    History of Barrett's esophagus    History of chronic gastritis    Hyperlipidemia    no meds taken   IBS (irritable bowel syndrome)    Internal hemorrhoids    Malignant neoplasm of prostate [facility]) urologist-  dr dahlstedt/  oncologist-  dr Kathrynn Running   dx 08-09-2017-- Stage T2a, Gleason 4+5, PSA 3.22 (on finateride)--- treatment ADT and external beam radiation   PONV (postoperative nausea and vomiting)    Seasonal allergic rhinitis    Tubular adenoma of colon    Wears glasses     Past Surgical History:  Procedure Laterality Date   COLONOSCOPY  last one 10-21-2016  dr Rhea Belton   CYSTOSCOPY WITH INSERTION OF UROLIFT N/A 12/15/2017   Procedure: CYSTOSCOPY WITH INSERTION OF UROLIFT, BIOPSY  PROSTATIC URETHAL;  Surgeon: Marcine Matar, MD;  Location: [facility];  Service: Urology;  Laterality: N/A;   ESOPHAGOGASTRODUODENOSCOPY (EGD) WITH PROPOFOL  last one 09-26-2013  dr pyrtle   INGUINAL HERNIA REPAIR Bilateral right 10-25-1997:  left 09-28-2008 (dr m. Daphine Deutscher @ [facility])   LAPAROSCOPIC CHOLECYSTECTOMY  2012   MANDIBLE SURGERY Bilateral 1990s   removal calcified bone growths   ROTATOR CUFF REPAIR Right 03-26-2009   dr Shelle Iron  [facility]   SHOULDER OPEN ROTATOR CUFF REPAIR  03/17/2012   Procedure: ROTATOR CUFF REPAIR SHOULDER OPEN;  Surgeon: Javier Docker, MD;  Location: [facility];  Service: Orthopedics;  Laterality: Left;  LEFT MINI OPEN ROTATOR CUFF REPAIR    SPACE OAR INSTILLATION N/A  12/15/2017   Procedure: SPACE OAR INSTILLATION;  Surgeon: Marcine Matar, MD;  Location: [facility];  Service: Urology;  Laterality: N/A;   TONSILLECTOMY  child   UPPER GASTROINTESTINAL ENDOSCOPY      Current Outpatient Medications  Medication Sig Dispense Refill   azelastine (ASTELIN) 0.1 % nasal spray Place 1 spray into both nostrils 2 (two) times daily. Use in each nostril as directed 90 mL 3   cetirizine-pseudoephedrine (ZYRTEC-D) 5-120 MG tablet Take 1 tablet by mouth as needed for allergies.     cyclobenzaprine (FLEXERIL) 10 MG tablet Take 0.5-1 tablets (5-10 mg total) by mouth 3 (three) times daily as needed for muscle spasms. 21 tablet 0   Docusate Sodium (COLACE PO) Take 1 capsule by mouth as needed.     fluticasone (FLONASE) 50 MCG/ACT nasal spray SPRAY 2 SPRAYS INTO EACH NOSTRIL EVERY DAY 48 mL 0   Misc Natural Products (NEURIVA PO) Take 1 capsule by mouth daily.     Multiple Vitamins-Minerals (OCUVITE ADULT 50+) CAPS Take 1 capsule by mouth daily.     Phenylephrine HCl (SUDAFED PE SINUS CONGESTION PO) Take 1 tablet by mouth as needed.     sertraline (ZOLOFT) 50 MG tablet TAKE 1 TABLET BY MOUTH EVERY DAY 90 tablet 2   Simethicone 500 MG CAPS Take 1 capsule by mouth as needed.     tadalafil (CIALIS) 5 MG tablet Take 1 tablet (5 mg total) by mouth daily. 90 tablet 3   Allergies  Allergen Reactions   Gabapentin     Caused drop in blood pressure and loss of consciousness    Family History  Problem Relation Age of Onset   Colon cancer Mother 95   Cancer Mother    Diabetes Maternal Grandmother    COPD Sister    Heart failure Father 49   Esophageal cancer Neg Hx    Rectal cancer Neg Hx    Stomach cancer Neg Hx      Review of Systems:  Constitutional:  no fevers Eye:  no recent significant change in vision Ear:  no hearing loss Nose/Mouth/Throat:  No dental complaints Neck/Thyroid:  no lumps or masses Pulmonary:  No shortness of breath Cardiovascular:   no chest pain Gastrointestinal:  no abdominal pain GU:  negative for dysuria Musculoskeletal/Extremities: +L hip pain Skin/Integumentary ROS:  no abnormal skin lesions reported Neurologic:  no HA   Objective:   Vitals:   06/30/23 1434  BP: 130/82  Pulse: 82  Resp: 16  Temp: 98 F (36.7 C)  TempSrc: Oral  SpO2: 95%  Weight: 188 lb (85.3 kg)  Height: 5\' 11"  (1.803 m)   Body mass index is 26.22 kg/m.  General:  well developed, well nourished, in no apparent distress Skin:  warm, no pallor or diaphoresis Head:  normocephalic, atraumatic Eyes:  pupils equal and round, sclera anicteric without injection Throat/Pharynx:  lips and gingiva without lesion; tongue and uvula midline;  non-inflamed pharynx; no exudates or postnasal drainage Neck: neck supple without adenopathy, thyromegaly, or masses, no bruits, no jugular venous distention Lungs:  clear to auscultation, breath sounds equal bilaterally, no respiratory distress Cardio:  regular rate and rhythm without murmurs Abdomen:  abdomen soft, nontender; bowel sounds normal; no masses, hepatomegaly or splenomegaly Musculoskeletal:  symmetrical muscle groups noted without atrophy or deformity Extremities:  no clubbing, cyanosis, or edema, no deformities, no skin discoloration Neuro:  gait antalgic Psych: Age appropriate judgment and insight; normal mood   Assessment:   Preop examination   Plan:   Orders as above. EKG not indicated.  He is low risk for proposed procedure and is medically optimized. He will hold NSAIDs 5 d prior to July 21 when his surg is scheduled.   The patient voiced understanding and agreement to the plan.  Jilda Roche [place], DO 06/30/23  3:52 PM

## 2023-06-30 NOTE — Patient Instructions (Signed)
Hold aspirin/NSAIDs for 5 days prior to your planned procedure.  Let us know if you need anything.

## 2023-07-05 ENCOUNTER — Other Ambulatory Visit (HOSPITAL_BASED_OUTPATIENT_CLINIC_OR_DEPARTMENT_OTHER): Payer: Self-pay

## 2023-07-06 ENCOUNTER — Other Ambulatory Visit (HOSPITAL_BASED_OUTPATIENT_CLINIC_OR_DEPARTMENT_OTHER): Payer: Self-pay

## 2023-07-07 ENCOUNTER — Other Ambulatory Visit (HOSPITAL_BASED_OUTPATIENT_CLINIC_OR_DEPARTMENT_OTHER): Payer: Self-pay

## 2023-07-20 ENCOUNTER — Other Ambulatory Visit: Payer: Self-pay

## 2023-07-20 ENCOUNTER — Ambulatory Visit (INDEPENDENT_AMBULATORY_CARE_PROVIDER_SITE_OTHER): Payer: Medicare Other | Admitting: Sports Medicine

## 2023-07-20 ENCOUNTER — Encounter: Payer: Self-pay | Admitting: Sports Medicine

## 2023-07-20 DIAGNOSIS — M25552 Pain in left hip: Secondary | ICD-10-CM

## 2023-07-20 DIAGNOSIS — G8929 Other chronic pain: Secondary | ICD-10-CM | POA: Diagnosis not present

## 2023-07-20 DIAGNOSIS — M1612 Unilateral primary osteoarthritis, left hip: Secondary | ICD-10-CM

## 2023-07-20 MED ORDER — METHYLPREDNISOLONE ACETATE 40 MG/ML IJ SUSP
80.0000 mg | INTRAMUSCULAR | Status: AC | PRN
  Administered 2023-07-20: 80 mg via INTRA_ARTICULAR

## 2023-07-20 MED ORDER — LIDOCAINE HCL 1 % IJ SOLN
4.0000 mL | INTRAMUSCULAR | Status: AC | PRN
  Administered 2023-07-20: 4 mL

## 2023-07-20 NOTE — Progress Notes (Signed)
   Office & Procedure Note  Patient: Manuel Landry             Date of Birth: 1945/08/30           MRN: [id number]             Visit Date: 07/20/2023  HPI: [name] is a pleasant 78 year-old male who presents with acute on chronic left hip pain with known arthritis.  He does report he had an injection many months ago which gave him good relief until the last week or so.  He is planning on having a hip replacement.  *Note reviewed from Dr. Roda Shutters from 12/17 as well as 06/25/2023.  He was able to meet with our surgical scheduler and does have this scheduled for 12/06/2023 for total hip arthroplasty.  PE:  - Left hip: No bony tenderness.  Positive FADIR and Stinchfield testing.  There is limitation in internal greater than external range of motion.  No redness swelling about the skin or anterior hip.  Imaging:  - Left hip XR 06/25/23: Advanced degenerative joint disease with bone-on-bone joint space  narrowing of the left hip joint.   Visit Diagnoses:  1. Primary osteoarthritis of left hip   2. Chronic left hip pain    Procedures: Large Joint Inj: L hip joint on 07/20/2023 10:53 AM Indications: pain Details: 22 G 3.5 in needle, ultrasound-guided anterior approach Medications: 4 mL lidocaine 1 %; 80 mg methylPREDNISolone acetate 40 MG/ML Outcome: tolerated well, no immediate complications  Procedure: US-guided intra-articular hip injection, left After discussion on risks/benefits/indications and informed verbal consent was obtained, a timeout was performed. Patient was lying supine on exam table. The hip was cleaned with betadine and alcohol swabs. Then utilizing ultrasound guidance, the patient's femoral head and neck junction was identified and subsequently injected with 4:2 lidocaine:depomedrol via an in-plane approach with ultrasound visualization of the injectate administered into the hip joint. Patient tolerated procedure well without immediate complications.  Procedure, treatment  alternatives, risks and benefits explained, specific risks discussed. Consent was given by the patient. Immediately prior to procedure a time out was called to verify the correct patient, procedure, equipment, support staff and site/side marked as required. Patient was prepped and draped in the usual sterile fashion.     Plan: -Discussed the nature of his advanced his arthritis, he is planning on having his hip replaced upcoming in July.  We discussed options such as hip injection for symptomatic relief until that time.  Through shared decision-making, did proceed with ultrasound-guided left hip injection -Advised on modified rest/activity for the next 48 hours, he may use ice/heat or Tylenol for any postinjection pain -Discussed safe activities such as walking; also recommend aquatic therapy, cycling and elliptical use as able -He will follow-up with Dr. Roda Shutters as indicated for planned left hip THA 12/06/2023; I am happy to see him as needed  Madelyn Brunner, DO Primary Care Sports Medicine Physician  [facility] - Orthopedics  This note was dictated using Dragon naturally speaking software and may contain errors in syntax, spelling, or content which have not been identified prior to signing this note.

## 2023-09-02 ENCOUNTER — Telehealth: Payer: Self-pay | Admitting: *Deleted

## 2023-09-02 NOTE — Telephone Encounter (Signed)
 Copied from CRM [id number]. Topic: General - Other >> Sep 02, 2023  8:41 AM Annelle Kiel wrote: Reason for CRM: patient is looking to schedule a appt for labs and to speak with the dr regarding a issue epic did not pull up the dr schedule

## 2023-09-06 NOTE — Telephone Encounter (Signed)
 Pt has appt scheduled.

## 2023-09-14 ENCOUNTER — Other Ambulatory Visit: Payer: Self-pay

## 2023-09-14 ENCOUNTER — Encounter: Payer: Self-pay | Admitting: Family Medicine

## 2023-09-14 ENCOUNTER — Ambulatory Visit (INDEPENDENT_AMBULATORY_CARE_PROVIDER_SITE_OTHER): Admitting: Family Medicine

## 2023-09-14 ENCOUNTER — Other Ambulatory Visit (HOSPITAL_BASED_OUTPATIENT_CLINIC_OR_DEPARTMENT_OTHER): Payer: Self-pay

## 2023-09-14 VITALS — BP 112/64 | HR 91 | Temp 98.0°F | Resp 16 | Ht 71.0 in | Wt 182.8 lb

## 2023-09-14 DIAGNOSIS — Z125 Encounter for screening for malignant neoplasm of prostate: Secondary | ICD-10-CM

## 2023-09-14 DIAGNOSIS — F411 Generalized anxiety disorder: Secondary | ICD-10-CM | POA: Diagnosis not present

## 2023-09-14 DIAGNOSIS — E7800 Pure hypercholesterolemia, unspecified: Secondary | ICD-10-CM

## 2023-09-14 DIAGNOSIS — Z8546 Personal history of malignant neoplasm of prostate: Secondary | ICD-10-CM

## 2023-09-14 LAB — COMPREHENSIVE METABOLIC PANEL WITH GFR
ALT: 11 U/L (ref 0–53)
AST: 18 U/L (ref 0–37)
Albumin: 4.3 g/dL (ref 3.5–5.2)
Alkaline Phosphatase: 49 U/L (ref 39–117)
BUN: 13 mg/dL (ref 6–23)
CO2: 30 meq/L (ref 19–32)
Calcium: 9.1 mg/dL (ref 8.4–10.5)
Chloride: 101 meq/L (ref 96–112)
Creatinine, Ser: 0.84 mg/dL (ref 0.40–1.50)
GFR: 83.79 mL/min (ref >60.00)
Glucose, Bld: 96 mg/dL (ref 70–99)
Potassium: 4.5 meq/L (ref 3.5–5.1)
Sodium: 135 meq/L (ref 135–145)
Total Bilirubin: 0.7 mg/dL (ref 0.2–1.2)
Total Protein: 6.7 g/dL (ref 6.0–8.3)

## 2023-09-14 LAB — LIPID PANEL
Cholesterol: 219 mg/dL — ABNORMAL HIGH (ref 0–200)
HDL: 61.4 mg/dL (ref >39.00)
LDL Cholesterol: 141 mg/dL — ABNORMAL HIGH (ref 0–99)
NonHDL: 158.05
Total CHOL/HDL Ratio: 4
Triglycerides: 84 mg/dL (ref 0.0–149.0)
VLDL: 16.8 mg/dL (ref 0.0–40.0)

## 2023-09-14 LAB — PSA, MEDICARE: PSA: 0.41 ng/mL (ref 0.10–4.00)

## 2023-09-14 NOTE — Progress Notes (Signed)
 Chief Complaint  Patient presents with   Follow-up    Follow up 6 Months    Subjective Manuel Landry presents for f/u anxiety.  Pt is currently being treated with Zoloft  50 mg/d.  Reports doing well since treatment. No thoughts of harming self or others. No self-medication with alcohol, prescription drugs or illicit drugs. Pt is not following with a counselor/psychologist.  Hyperlipidemia Patient presents for hyperlipidemia follow up. Currently being treated with no medication and compliance with treatment thus far has been good. He is adhering to a healthy diet. Exercise: walking No CP or SOB. The patient is not known to have coexisting coronary artery disease.  Past Medical History:  Diagnosis Date   Allergy    Anxiety    Arthritis    Blind loop syndrome    Bulging discs    LOWER BACK   Cataract    "beginnings of"   Diverticulosis    Family history of malignant neoplasm of gastrointestinal tract    History of adenomatous polyp of colon    History of Barrett's esophagus    History of chronic gastritis    Hyperlipidemia    no meds taken   IBS (irritable bowel syndrome)    Internal hemorrhoids    Malignant neoplasm of prostate [facility]) urologist-  dr dahlstedt/  oncologist-  dr Lorri Rota   dx 08-09-2017-- Stage T2a, Gleason 4+5, PSA 3.22 (on finateride)--- treatment ADT and external beam radiation   PONV (postoperative nausea and vomiting)    Seasonal allergic rhinitis    Tubular adenoma of colon    Wears glasses    Allergies as of 09/14/2023       Reactions   Gabapentin     Caused drop in blood pressure and loss of consciousness        Medication List        Accurate as of September 14, 2023  8:29 AM. If you have any questions, ask your nurse or doctor.          STOP taking these medications    cetirizine-pseudoephedrine 5-120 MG tablet Commonly known as: ZYRTEC-D Stopped by: Jobe Mulder   cyclobenzaprine  10 MG tablet Commonly known as:  FLEXERIL  Stopped by: Shellie Dials Shandell Giovanni   Simethicone 500 MG Caps Stopped by: Jobe Mulder   SUDAFED PE SINUS CONGESTION PO Stopped by: Jobe Mulder       TAKE these medications    azelastine  0.1 % nasal spray Commonly known as: ASTELIN  Place 1 spray into both nostrils 2 (two) times daily. Use in each nostril as directed   COLACE PO Take 1 capsule by mouth as needed.   fluticasone  50 MCG/ACT nasal spray Commonly known as: FLONASE  SPRAY 2 SPRAYS INTO EACH NOSTRIL EVERY DAY   NEURIVA PO Take 1 capsule by mouth daily.   Ocuvite Adult 50+ Caps Take 1 capsule by mouth daily.   sertraline  50 MG tablet Commonly known as: ZOLOFT  Take 1 tablet (50 mg total) by mouth daily.   tadalafil  5 MG tablet Commonly known as: Cialis  Take 1 tablet (5 mg total) by mouth daily.        Exam BP 112/64 (BP Location: Left Arm, Patient Position: Sitting)   Pulse 91   Temp 98 F (36.7 C) (Oral)   Resp 16   Ht 5\' 11"  (1.803 m)   Wt 182 lb 12.8 oz (82.9 kg)   SpO2 98%   BMI 25.50 kg/m  General:  well developed, well nourished, in no apparent  distress Lungs:  No respiratory distress Psych: well oriented with normal range of affect and age-appropriate judgement/insight, alert and oriented x4.  Assessment and Plan  GAD (generalized anxiety disorder)  Pure hypercholesterolemia - Plan: Comprehensive metabolic panel with GFR, Lipid panel  History of prostate cancer  Screening for prostate cancer - Plan: PSA, Medicare ( [place] only)  Chronic, stable. Cont Zoloft  50 mg/d.  Chronic, unsure if stable. Ck labs today. Discussed statins. He is not thrilled with the idea of them. Counseled on diet/exercise.  Ck PSA.  F/u in 6 mo. The patient voiced understanding and agreement to the plan.  Shellie Dials [place], DO 09/14/23 8:29 AM

## 2023-09-14 NOTE — Patient Instructions (Signed)
 Give [place] 2-3 business days to get the results of your labs back.   Keep the diet clean and stay active.  Let us know if you need anything.

## 2023-09-28 ENCOUNTER — Encounter: Payer: Self-pay | Admitting: Family Medicine

## 2023-10-21 ENCOUNTER — Other Ambulatory Visit (INDEPENDENT_AMBULATORY_CARE_PROVIDER_SITE_OTHER)

## 2023-10-21 DIAGNOSIS — E7800 Pure hypercholesterolemia, unspecified: Secondary | ICD-10-CM

## 2023-10-22 ENCOUNTER — Ambulatory Visit: Payer: Self-pay | Admitting: Family Medicine

## 2023-10-22 LAB — LIPID PANEL
Cholesterol: 202 mg/dL — ABNORMAL HIGH (ref 0–200)
HDL: 60.4 mg/dL (ref >39.00)
LDL Cholesterol: 128 mg/dL — ABNORMAL HIGH (ref 0–99)
NonHDL: 141.1
Total CHOL/HDL Ratio: 3
Triglycerides: 67 mg/dL (ref 0.0–149.0)
VLDL: 13.4 mg/dL (ref 0.0–40.0)

## 2023-11-04 DIAGNOSIS — Z85828 Personal history of other malignant neoplasm of skin: Secondary | ICD-10-CM | POA: Diagnosis not present

## 2023-11-04 DIAGNOSIS — D2272 Melanocytic nevi of left lower limb, including hip: Secondary | ICD-10-CM | POA: Diagnosis not present

## 2023-11-04 DIAGNOSIS — D225 Melanocytic nevi of trunk: Secondary | ICD-10-CM | POA: Diagnosis not present

## 2023-11-04 DIAGNOSIS — L905 Scar conditions and fibrosis of skin: Secondary | ICD-10-CM | POA: Diagnosis not present

## 2023-11-04 DIAGNOSIS — L821 Other seborrheic keratosis: Secondary | ICD-10-CM | POA: Diagnosis not present

## 2023-11-04 DIAGNOSIS — D2271 Melanocytic nevi of right lower limb, including hip: Secondary | ICD-10-CM | POA: Diagnosis not present

## 2023-11-04 NOTE — Progress Notes (Signed)
 PCP - Dawna Etienne, DO [facility] 09-14-23 epic Cardiologist - tessa Conte,PA-C [facility] 12-22-21 epic   PPM/ICD -  Device Orders -  Rep Notified -   Chest x-ray -  EKG -  Stress Test -  ECHO - 2022 epic Cardiac Cath -   Sleep Study -  CPAP -   Fasting Blood Sugar -  Checks Blood Sugar _____ times a day  Blood Thinner Instructions: Aspirin Instructions:  ERAS Protcol - PRE-SURGERY Ensure  -  COVID vaccine -  Activity-- Anesthesia review:   Patient denies shortness of breath, fever, cough and chest pain at PAT appointment   All instructions explained to the patient, with a verbal understanding of the material. Patient agrees to go over the instructions while at home for a better understanding. Patient also instructed to self quarantine after being tested for COVID-19. The opportunity to ask questions was provided.

## 2023-11-04 NOTE — Patient Instructions (Addendum)
 SURGICAL WAITING ROOM VISITATION  Patients having surgery or a procedure may have no more than 2 support people in the waiting area - these visitors may rotate.    Children under the age of 22 must have an adult with them who is not the patient.  Visitors with respiratory illnesses are discouraged from visiting and should remain at home.  If the patient needs to stay at the hospital during part of their recovery, the visitor guidelines for inpatient rooms apply. Pre-op nurse will coordinate an appropriate time for 1 support person to accompany patient in pre-op.  This support person may not rotate.    Please refer to the [facility] website for the visitor guidelines for Inpatients (after your surgery is over and you are in a regular room).    Your procedure is scheduled on: 11-25-23    Report to [facility] Main Entrance    Report to admitting at 7:20 AM   Call this number if you have problems the morning of surgery [phone]   Do not eat food :After Midnight.   After Midnight you may have the following liquids until 6:50 AM  DAY OF SURGERY  then nothing by mouth  Water  Non-Citrus Juices (without pulp, NO RED-Apple, White grape, White cranberry) Black Coffee (NO MILK/CREAM OR CREAMERS, sugar ok)  Clear Tea (NO MILK/CREAM OR CREAMERS, sugar ok) regular and decaf                             Plain Jell-O (NO RED)                                           Fruit ices (not with fruit pulp, NO RED)                                     Popsicles (NO RED)                                                               Sports drinks like Gatorade (NO RED)                    The day of surgery:  Drink ONE (1) Pre-Surgery Clear Ensure BY 6:50 AM the morning of surgery. Drink in one sitting. Do not sip.  This drink was given to you during your hospital  pre-op appointment visit. Nothing else to drink after completing the  Pre-Surgery Clear Ensure .          If you have  questions, please contact your surgeon's office.   FOLLOW  ANY ADDITIONAL PRE OP INSTRUCTIONS YOU RECEIVED FROM YOUR SURGEON'S OFFICE!!!     Oral Hygiene is also important to reduce your risk of infection.                                    Remember - BRUSH YOUR TEETH THE MORNING OF SURGERY WITH YOUR REGULAR TOOTHPASTE  DENTURES WILL BE REMOVED PRIOR  TO SURGERY PLEASE DO NOT APPLY Poly grip OR ADHESIVES!!!   Stop all vitamins and herbal supplements 7 days before surgery.   Take these medicines the morning of surgery with A SIP OF WATER : Zoloft , Flonase , nasal spray                              You may not have any metal on your body including hair pins, jewelry, and body piercing             Do not wear  lotions, powders, perfumes/cologne, or deodorant              Men may shave face and neck.   Do not bring valuables to the hospital. [place] IS NOT             RESPONSIBLE   FOR VALUABLES.   Contacts, glasses, dentures or bridgework may not be worn into surgery.   Bring small overnight bag day of surgery.   DO NOT BRING YOUR HOME MEDICATIONS TO THE HOSPITAL. PHARMACY WILL DISPENSE MEDICATIONS LISTED ON YOUR MEDICATION LIST TO YOU DURING YOUR ADMISSION IN THE HOSPITAL!              Please read over the following fact sheets you were given: IF YOU HAVE QUESTIONS ABOUT YOUR PRE-OP INSTRUCTIONS PLEASE CALL [phone]    If you test positive for Covid or have been in contact with anyone that has tested positive in the last 10 days please notify you surgeon.      Pre-operative 5 CHG Bath Instructions   You can play a key role in reducing the risk of infection after surgery. Your skin needs to be as free of germs as possible. You can reduce the number of germs on your skin by washing with CHG (chlorhexidine  gluconate) soap before surgery. CHG is an antiseptic soap that kills germs and continues to kill germs even after washing.   DO NOT use if you have an allergy to  chlorhexidine /CHG or antibacterial soaps. If your skin becomes reddened or irritated, stop using the CHG and notify one of our RNs at [phone].   Please shower with the CHG soap starting 4 days before surgery using the following schedule:     Please keep in mind the following:  DO NOT shave, including legs and underarms, starting the day of your first shower.   You may shave your face at any point before/day of surgery.  Place clean sheets on your bed the day you start using CHG soap. Use a clean washcloth (not used since being washed) for each shower. DO NOT sleep with pets once you start using the CHG.   CHG Shower Instructions:  If you choose to wash your hair and private area, wash first with your normal shampoo/soap.  After you use shampoo/soap, rinse your hair and body thoroughly to remove shampoo/soap residue.  Turn the water  OFF and apply about 3 tablespoons (45 ml) of CHG soap to a CLEAN washcloth.  Apply CHG soap ONLY FROM YOUR NECK DOWN TO YOUR TOES (washing for 3-5 minutes)  DO NOT use CHG soap on face, private areas, open wounds, or sores.  Pay special attention to the area where your surgery is being performed.  If you are having back surgery, having someone wash your back for you may be helpful. Wait 2 minutes after CHG soap is applied, then you may rinse off the CHG soap.  Pat dry with a clean towel  Put on clean clothes/pajamas   If you choose to wear lotion, please use ONLY the CHG-compatible lotions on the back of this paper.     Additional instructions for the day of surgery: DO NOT APPLY any lotions, deodorants, cologne, or perfumes.   Put on clean/comfortable clothes.  Brush your teeth.  Ask your nurse before applying any prescription medications to the skin.      CHG Compatible Lotions   Aveeno Moisturizing lotion  Cetaphil Moisturizing Cream  Cetaphil Moisturizing Lotion  Clairol Herbal Essence Moisturizing Lotion, Dry Skin  Clairol Herbal Essence  Moisturizing Lotion, Extra Dry Skin  Clairol Herbal Essence Moisturizing Lotion, Normal Skin  Curel Age Defying Therapeutic Moisturizing Lotion with Alpha Hydroxy  Curel Extreme Care Body Lotion  Curel Soothing Hands Moisturizing Hand Lotion  Curel Therapeutic Moisturizing Cream, Fragrance-Free  Curel Therapeutic Moisturizing Lotion, Fragrance-Free  Curel Therapeutic Moisturizing Lotion, Original Formula  Eucerin Daily Replenishing Lotion  Eucerin Dry Skin Therapy Plus Alpha Hydroxy Crme  Eucerin Dry Skin Therapy Plus Alpha Hydroxy Lotion  Eucerin Original Crme  Eucerin Original Lotion  Eucerin Plus Crme Eucerin Plus Lotion  Eucerin TriLipid Replenishing Lotion  Keri Anti-Bacterial Hand Lotion  Keri Deep Conditioning Original Lotion Dry Skin Formula Softly Scented  Keri Deep Conditioning Original Lotion, Fragrance Free Sensitive Skin Formula  Keri Lotion Fast Absorbing Fragrance Free Sensitive Skin Formula  Keri Lotion Fast Absorbing Softly Scented Dry Skin Formula  Keri Original Lotion  Keri Skin Renewal Lotion Keri Silky Smooth Lotion  Keri Silky Smooth Sensitive Skin Lotion  Nivea Body Creamy Conditioning Oil  Nivea Body Extra Enriched Teacher, adult education Moisturizing Lotion Nivea Crme  Nivea Skin Firming Lotion  NutraDerm 30 Skin Lotion  NutraDerm Skin Lotion  NutraDerm Therapeutic Skin Cream  NutraDerm Therapeutic Skin Lotion  ProShield Protective Hand Cream  Provon moisturizing lotion   WHAT IS A BLOOD TRANSFUSION? Blood Transfusion Information  A transfusion is the replacement of blood or some of its parts. Blood is made up of multiple cells which provide different functions. Red blood cells carry oxygen and are used for blood loss replacement. White blood cells fight against infection. Platelets control bleeding. Plasma helps clot blood. Other blood products are available for specialized needs, such as hemophilia or other clotting  disorders. BEFORE THE TRANSFUSION  Who gives blood for transfusions?  Healthy volunteers who are fully evaluated to make sure their blood is safe. This is blood bank blood. Transfusion therapy is the safest it has ever been in the practice of medicine. Before blood is taken from a donor, a complete history is taken to make sure that person has no history of diseases nor engages in risky social behavior (examples are intravenous drug use or sexual activity with multiple partners). The donor's travel history is screened to minimize risk of transmitting infections, such as malaria. The donated blood is tested for signs of infectious diseases, such as HIV and hepatitis. The blood is then tested to be sure it is compatible with you in order to minimize the chance of a transfusion reaction. If you or a relative donates blood, this is often done in anticipation of surgery and is not appropriate for emergency situations. It takes many days to process the donated blood. RISKS AND COMPLICATIONS Although transfusion therapy is very safe and saves many lives, the main dangers of transfusion include:  Getting an infectious disease. Developing a transfusion reaction. This is  an allergic reaction to something in the blood you were given. Every precaution is taken to prevent this. The decision to have a blood transfusion has been considered carefully by your caregiver before blood is given. Blood is not given unless the benefits outweigh the risks. AFTER THE TRANSFUSION Right after receiving a blood transfusion, you will usually feel much better and more energetic. This is especially true if your red blood cells have gotten low (anemic). The transfusion raises the level of the red blood cells which carry oxygen, and this usually causes an energy increase. The nurse administering the transfusion will monitor you carefully for complications. HOME CARE INSTRUCTIONS  No special instructions are needed after a transfusion.  You may find your energy is better. Speak with your caregiver about any limitations on activity for underlying diseases you may have. SEEK MEDICAL CARE IF:  Your condition is not improving after your transfusion. You develop redness or irritation at the intravenous (IV) site. SEEK IMMEDIATE MEDICAL CARE IF:  Any of the following symptoms occur over the next 12 hours: Shaking chills. You have a temperature by mouth above 102 F (38.9 C), not controlled by medicine. Chest, back, or muscle pain. People around you feel you are not acting correctly or are confused. Shortness of breath or difficulty breathing. Dizziness and fainting. You get a rash or develop hives. You have a decrease in urine output. Your urine turns a dark color or changes to pink, red, or brown. Any of the following symptoms occur over the next 10 days: You have a temperature by mouth above 102 F (38.9 C), not controlled by medicine. Shortness of breath. Weakness after normal activity. The white part of the eye turns yellow (jaundice). You have a decrease in the amount of urine or are urinating less often. Your urine turns a dark color or changes to pink, red, or brown. Document Released: 05/01/2000 Document Revised: 07/27/2011 Document Reviewed: 12/19/2007 [employer] Patient Information 2014 [place], [place].  _______________________________________________________________________  Incentive Spirometer  An incentive spirometer is a tool that can help keep your lungs clear and active. This tool measures how well you are filling your lungs with each breath. Taking long deep breaths may help reverse or decrease the chance of developing breathing (pulmonary) problems (especially infection) following: A long period of time when you are unable to move or be active. BEFORE THE PROCEDURE  If the spirometer includes an indicator to show your best effort, your nurse or respiratory therapist will set it to a desired goal. If  possible, sit up straight or lean slightly forward. Try not to slouch. Hold the incentive spirometer in an upright position. INSTRUCTIONS FOR USE  Sit on the edge of your bed if possible, or sit up as far as you can in bed or on a chair. Hold the incentive spirometer in an upright position. Breathe out normally. Place the mouthpiece in your mouth and seal your lips tightly around it. Breathe in slowly and as deeply as possible, raising the piston or the ball toward the top of the column. Hold your breath for 3-5 seconds or for as long as possible. Allow the piston or ball to fall to the bottom of the column. Remove the mouthpiece from your mouth and breathe out normally. Rest for a few seconds and repeat Steps 1 through 7 at least 10 times every 1-2 hours when you are awake. Take your time and take a few normal breaths between deep breaths. The spirometer may include an indicator to  show your best effort. Use the indicator as a goal to work toward during each repetition. After each set of 10 deep breaths, practice coughing to be sure your lungs are clear. If you have an incision (the cut made at the time of surgery), support your incision when coughing by placing a pillow or rolled up towels firmly against it. Once you are able to get out of bed, walk around indoors and cough well. You may stop using the incentive spirometer when instructed by your caregiver.  RISKS AND COMPLICATIONS Take your time so you do not get dizzy or light-headed. If you are in pain, you may need to take or ask for pain medication before doing incentive spirometry. It is harder to take a deep breath if you are having pain. AFTER USE Rest and breathe slowly and easily. It can be helpful to keep track of a log of your progress. Your caregiver can provide you with a simple table to help with this. If you are using the spirometer at home, follow these instructions: SEEK MEDICAL CARE IF:  You are having difficultly using the  spirometer. You have trouble using the spirometer as often as instructed. Your pain medication is not giving enough relief while using the spirometer. You develop fever of 100.5 F (38.1 C) or higher. SEEK IMMEDIATE MEDICAL CARE IF:  You cough up bloody sputum that had not been present before. You develop fever of 102 F (38.9 C) or greater. You develop worsening pain at or near the incision site. MAKE SURE YOU:  Understand these instructions. Will watch your condition. Will get help right away if you are not doing well or get worse. Document Released: 09/14/2006 Document Revised: 07/27/2011 Document Reviewed: 11/15/2006 [facility] Patient Information 2014 [place], [place].   ________________________________________________________________________

## 2023-11-09 ENCOUNTER — Encounter (HOSPITAL_COMMUNITY)

## 2023-11-11 ENCOUNTER — Other Ambulatory Visit (HOSPITAL_BASED_OUTPATIENT_CLINIC_OR_DEPARTMENT_OTHER): Payer: Self-pay

## 2023-11-12 DIAGNOSIS — H26493 Other secondary cataract, bilateral: Secondary | ICD-10-CM | POA: Diagnosis not present

## 2023-11-12 DIAGNOSIS — H18593 Other hereditary corneal dystrophies, bilateral: Secondary | ICD-10-CM | POA: Diagnosis not present

## 2023-11-12 DIAGNOSIS — H43813 Vitreous degeneration, bilateral: Secondary | ICD-10-CM | POA: Diagnosis not present

## 2023-11-12 DIAGNOSIS — Z83511 Family history of glaucoma: Secondary | ICD-10-CM | POA: Diagnosis not present

## 2023-11-17 ENCOUNTER — Encounter (HOSPITAL_COMMUNITY): Payer: Self-pay

## 2023-11-17 ENCOUNTER — Other Ambulatory Visit: Payer: Self-pay

## 2023-11-17 ENCOUNTER — Encounter (HOSPITAL_COMMUNITY)
Admission: RE | Admit: 2023-11-17 | Discharge: 2023-11-17 | Disposition: A | Discharge status: Home / Self Care | Source: Ambulatory Visit | Attending: Orthopaedic Surgery | Admitting: Orthopaedic Surgery

## 2023-11-17 VITALS — BP 130/87 | HR 88 | Temp 97.6°F | Resp 16 | Ht 71.0 in | Wt 176.0 lb

## 2023-11-17 DIAGNOSIS — M1612 Unilateral primary osteoarthritis, left hip: Secondary | ICD-10-CM | POA: Insufficient documentation

## 2023-11-17 DIAGNOSIS — Z01818 Encounter for other preprocedural examination: Secondary | ICD-10-CM | POA: Insufficient documentation

## 2023-11-17 LAB — BASIC METABOLIC PANEL WITH GFR
Anion gap: 9 (ref 5–15)
BUN: 11 mg/dL (ref 8–23)
CO2: 25 mmol/L (ref 22–32)
Calcium: 9.3 mg/dL (ref 8.9–10.3)
Chloride: 98 mmol/L (ref 98–111)
Creatinine, Ser: 0.95 mg/dL (ref 0.61–1.24)
GFR, Estimated: >60 mL/min (ref >60)
Glucose, Bld: 101 mg/dL — ABNORMAL HIGH (ref 70–99)
Potassium: 5.1 mmol/L (ref 3.5–5.1)
Sodium: 132 mmol/L — ABNORMAL LOW (ref 135–145)

## 2023-11-17 LAB — CBC
HCT: 44.4 % (ref 39.0–52.0)
Hemoglobin: 15 g/dL (ref 13.0–17.0)
MCH: 30.4 pg (ref 26.0–34.0)
MCHC: 33.8 g/dL (ref 30.0–36.0)
MCV: 89.9 fL (ref 80.0–100.0)
Platelets: 221 10*3/uL (ref 150–400)
RBC: 4.94 MIL/uL (ref 4.22–5.81)
RDW: 13.1 % (ref 11.5–15.5)
WBC: 5.4 10*3/uL (ref 4.0–10.5)
nRBC: 0 % (ref 0.0–0.2)

## 2023-11-17 LAB — SURGICAL PCR SCREEN
MRSA, PCR: POSITIVE — AB
Staphylococcus aureus: POSITIVE — AB

## 2023-11-17 NOTE — Progress Notes (Signed)
 COVID Vaccine Completed:  Date of COVID positive in last 90 days:  PCP - Mabel Pry, DO LOV 09/14/23 Cardiologist - Orren Fabry, PA LOV 12/22/21  Chest x-ray - n/a EKG - n/a Stress Test - n/a ECHO - n/a Cardiac Cath - n/a Pacemaker/ICD device last checked: n/a Spinal Cord Stimulator: n/a  Bowel Prep - n/a  Sleep Study - n/a CPAP -   Fasting Blood Sugar - n/a Checks Blood Sugar _____ times a day  Last dose of GLP1 agonist-  N/A GLP1 instructions:  Do not take after     Last dose of SGLT-2 inhibitors-  N/A SGLT-2 instructions:  Do not take after     Blood Thinner Instructions:  Last dose:   Time: Aspirin Instructions: Last Dose:  Activity level: Can go up a flight of stairs and perform activities of daily living without stopping and without symptoms of chest pain or shortness of breath. Ambulates with cane PRN.  Anesthesia review:   Patient denies shortness of breath, fever, cough and chest pain at PAT appointment  Patient verbalized understanding of instructions that were given to them at the PAT appointment. Patient was also instructed that they will need to review over the PAT instructions again at home before surgery.

## 2023-11-17 NOTE — Progress Notes (Signed)
 PCR results routed to Dr. Jerri

## 2023-11-18 ENCOUNTER — Other Ambulatory Visit: Payer: Self-pay | Admitting: Physician Assistant

## 2023-11-18 ENCOUNTER — Other Ambulatory Visit (HOSPITAL_BASED_OUTPATIENT_CLINIC_OR_DEPARTMENT_OTHER): Payer: Self-pay

## 2023-11-18 MED ORDER — DOCUSATE SODIUM 100 MG PO CAPS
100.0000 mg | ORAL_CAPSULE | Freq: Every day | ORAL | 2 refills | Status: AC | PRN
  Filled 2023-11-18: qty 100, 90d supply, fill #0

## 2023-11-18 MED ORDER — OXYCODONE-ACETAMINOPHEN 5-325 MG PO TABS
1.0000 | ORAL_TABLET | Freq: Four times a day (QID) | ORAL | 0 refills | Status: DC | PRN
  Filled 2023-11-18: qty 40, 5d supply, fill #0

## 2023-11-18 MED ORDER — METHOCARBAMOL 500 MG PO TABS
500.0000 mg | ORAL_TABLET | Freq: Two times a day (BID) | ORAL | 2 refills | Status: DC | PRN
  Filled 2023-11-18: qty 20, 10d supply, fill #0
  Filled 2023-12-16: qty 20, 10d supply, fill #1

## 2023-11-18 MED ORDER — ASPIRIN 81 MG PO CHEW
81.0000 mg | CHEWABLE_TABLET | Freq: Two times a day (BID) | ORAL | 0 refills | Status: DC
  Filled 2023-11-18: qty 90, 45d supply, fill #0

## 2023-11-18 MED ORDER — ONDANSETRON HCL 4 MG PO TABS
4.0000 mg | ORAL_TABLET | Freq: Three times a day (TID) | ORAL | 0 refills | Status: DC | PRN
  Filled 2023-11-18: qty 40, 14d supply, fill #0

## 2023-11-22 ENCOUNTER — Other Ambulatory Visit (HOSPITAL_BASED_OUTPATIENT_CLINIC_OR_DEPARTMENT_OTHER): Payer: Self-pay

## 2023-11-24 ENCOUNTER — Telehealth: Payer: Self-pay | Admitting: *Deleted

## 2023-11-24 DIAGNOSIS — M1612 Unilateral primary osteoarthritis, left hip: Secondary | ICD-10-CM | POA: Insufficient documentation

## 2023-11-24 MED ORDER — TRANEXAMIC ACID 1000 MG/10ML IV SOLN
2000.0000 mg | INTRAVENOUS | Status: DC
  Filled 2023-11-24: qty 20

## 2023-11-24 NOTE — Care Plan (Signed)
 [facility] RNCM call to patient to discuss his upcoming Left total hip arthroplasty with Dr. Jerri on 11/25/23 at [facility]. He is agreeable to case management. He lives with his spouse, who will be able to assist after discharge home. He will need a RW. Referral to [facility] made by [facility]. Anticipate HHPT will be needed after a short hospital stay. Referral made to [facility] after choice provided. Reviewed all post op care instructions with patient and his spouse. Will continue to follow for needs.

## 2023-11-24 NOTE — Telephone Encounter (Signed)
 [facility] RNCM pre-op call to patient completed for upcoming Left total hip on 11/25/23.

## 2023-11-25 ENCOUNTER — Encounter (HOSPITAL_COMMUNITY): Payer: Self-pay | Admitting: Orthopaedic Surgery

## 2023-11-25 ENCOUNTER — Ambulatory Visit (HOSPITAL_COMMUNITY): Admitting: Anesthesiology

## 2023-11-25 ENCOUNTER — Other Ambulatory Visit: Payer: Self-pay

## 2023-11-25 ENCOUNTER — Other Ambulatory Visit (HOSPITAL_BASED_OUTPATIENT_CLINIC_OR_DEPARTMENT_OTHER): Payer: Self-pay

## 2023-11-25 ENCOUNTER — Encounter (HOSPITAL_COMMUNITY)
Admission: RE | Disposition: A | Discharge status: Home Health | Payer: Self-pay | Source: Ambulatory Visit | Attending: Orthopaedic Surgery

## 2023-11-25 ENCOUNTER — Ambulatory Visit (HOSPITAL_COMMUNITY)

## 2023-11-25 ENCOUNTER — Observation Stay (HOSPITAL_COMMUNITY)
Admission: RE | Admit: 2023-11-25 | Discharge: 2023-11-26 | Disposition: A | Discharge status: Home Health | Payer: Medicare Other | Source: Ambulatory Visit | Attending: Orthopaedic Surgery | Admitting: Orthopaedic Surgery

## 2023-11-25 DIAGNOSIS — Z7982 Long term (current) use of aspirin: Secondary | ICD-10-CM | POA: Diagnosis not present

## 2023-11-25 DIAGNOSIS — M129 Arthropathy, unspecified: Secondary | ICD-10-CM | POA: Diagnosis not present

## 2023-11-25 DIAGNOSIS — Z96642 Presence of left artificial hip joint: Secondary | ICD-10-CM

## 2023-11-25 DIAGNOSIS — M1612 Unilateral primary osteoarthritis, left hip: Secondary | ICD-10-CM

## 2023-11-25 DIAGNOSIS — Z8546 Personal history of malignant neoplasm of prostate: Secondary | ICD-10-CM | POA: Insufficient documentation

## 2023-11-25 DIAGNOSIS — Z79899 Other long term (current) drug therapy: Secondary | ICD-10-CM | POA: Insufficient documentation

## 2023-11-25 DIAGNOSIS — Z471 Aftercare following joint replacement surgery: Secondary | ICD-10-CM | POA: Diagnosis not present

## 2023-11-25 HISTORY — PX: TOTAL HIP ARTHROPLASTY: SHX124

## 2023-11-25 LAB — TYPE AND SCREEN
ABO/RH(D): A POS
Antibody Screen: NEGATIVE

## 2023-11-25 LAB — ABO/RH: ABO/RH(D): A POS

## 2023-11-25 SURGERY — ARTHROPLASTY, HIP, TOTAL, ANTERIOR APPROACH
Anesthesia: Spinal | Site: Hip | Laterality: Left

## 2023-11-25 MED ORDER — POVIDONE-IODINE 10 % EX SWAB
2.0000 | Freq: Once | CUTANEOUS | Status: AC
  Administered 2023-11-25: 2 via TOPICAL

## 2023-11-25 MED ORDER — FENTANYL CITRATE (PF) 100 MCG/2ML IJ SOLN
INTRAMUSCULAR | Status: AC
  Filled 2023-11-25: qty 2

## 2023-11-25 MED ORDER — SERTRALINE HCL 50 MG PO TABS
50.0000 mg | ORAL_TABLET | Freq: Every day | ORAL | Status: DC
  Administered 2023-11-25 – 2023-11-26 (×2): 50 mg via ORAL
  Filled 2023-11-25 (×2): qty 1

## 2023-11-25 MED ORDER — ONDANSETRON HCL 4 MG PO TABS: 4.0000 mg | ORAL_TABLET | Freq: Four times a day (QID) | ORAL | Status: DC | PRN

## 2023-11-25 MED ORDER — LACTATED RINGERS IV SOLN: INTRAVENOUS | Status: DC

## 2023-11-25 MED ORDER — ACETAMINOPHEN 500 MG PO TABS
1000.0000 mg | ORAL_TABLET | Freq: Four times a day (QID) | ORAL | Status: AC
  Administered 2023-11-25 – 2023-11-26 (×4): 1000 mg via ORAL
  Filled 2023-11-25 (×5): qty 2

## 2023-11-25 MED ORDER — OXYCODONE HCL ER 10 MG PO T12A
10.0000 mg | EXTENDED_RELEASE_TABLET | Freq: Two times a day (BID) | ORAL | Status: DC
  Administered 2023-11-25 – 2023-11-26 (×2): 10 mg via ORAL
  Filled 2023-11-25 (×2): qty 1

## 2023-11-25 MED ORDER — SORBITOL 70 % SOLN: 30.0000 mL | Freq: Every day | Status: DC | PRN

## 2023-11-25 MED ORDER — METOCLOPRAMIDE HCL 5 MG PO TABS: 5.0000 mg | ORAL_TABLET | Freq: Three times a day (TID) | ORAL | Status: DC | PRN

## 2023-11-25 MED ORDER — DEXAMETHASONE SODIUM PHOSPHATE 10 MG/ML IJ SOLN
INTRAMUSCULAR | Status: DC | PRN
  Administered 2023-11-25: 4 mg via INTRAVENOUS

## 2023-11-25 MED ORDER — OXYCODONE HCL 5 MG PO TABS
5.0000 mg | ORAL_TABLET | ORAL | Status: DC | PRN
  Administered 2023-11-26: 10 mg via ORAL
  Filled 2023-11-25 (×2): qty 2

## 2023-11-25 MED ORDER — MUPIROCIN 2 % EX OINT
1.0000 | TOPICAL_OINTMENT | Freq: Two times a day (BID) | CUTANEOUS | 0 refills | Status: AC
  Filled 2023-11-25: qty 66, 33d supply, fill #0

## 2023-11-25 MED ORDER — DIPHENHYDRAMINE HCL 12.5 MG/5ML PO ELIX: 25.0000 mg | ORAL_SOLUTION | ORAL | Status: DC | PRN

## 2023-11-25 MED ORDER — CEFAZOLIN SODIUM-DEXTROSE 2-4 GM/100ML-% IV SOLN
2.0000 g | INTRAVENOUS | Status: AC
  Administered 2023-11-25: 2 g via INTRAVENOUS
  Filled 2023-11-25: qty 100

## 2023-11-25 MED ORDER — PROPOFOL 500 MG/50ML IV EMUL
INTRAVENOUS | Status: DC | PRN
  Administered 2023-11-25: 50 ug/kg/min via INTRAVENOUS

## 2023-11-25 MED ORDER — ORAL CARE MOUTH RINSE: 15.0000 mL | Freq: Once | OROMUCOSAL | Status: AC

## 2023-11-25 MED ORDER — CEFAZOLIN SODIUM-DEXTROSE 2-4 GM/100ML-% IV SOLN
2.0000 g | Freq: Four times a day (QID) | INTRAVENOUS | Status: AC
  Administered 2023-11-25 (×2): 2 g via INTRAVENOUS
  Filled 2023-11-25 (×2): qty 100

## 2023-11-25 MED ORDER — TRANEXAMIC ACID-NACL 1000-0.7 MG/100ML-% IV SOLN
1000.0000 mg | Freq: Once | INTRAVENOUS | Status: AC
  Administered 2023-11-25: 1000 mg via INTRAVENOUS
  Filled 2023-11-25: qty 100

## 2023-11-25 MED ORDER — SODIUM CHLORIDE 0.9 % IV SOLN: INTRAVENOUS | Status: DC

## 2023-11-25 MED ORDER — MIDAZOLAM HCL 2 MG/2ML IJ SOLN: 0.5000 mg | Freq: Once | INTRAMUSCULAR | Status: DC | PRN

## 2023-11-25 MED ORDER — BUPIVACAINE IN DEXTROSE 0.75-8.25 % IT SOLN
INTRATHECAL | Status: DC | PRN
  Administered 2023-11-25: 13.5 mg via INTRATHECAL

## 2023-11-25 MED ORDER — CHLORHEXIDINE GLUCONATE 0.12 % MT SOLN
15.0000 mL | Freq: Once | OROMUCOSAL | Status: AC
  Administered 2023-11-25: 15 mL via OROMUCOSAL

## 2023-11-25 MED ORDER — OXYCODONE HCL 5 MG PO TABS: 5.0000 mg | ORAL_TABLET | Freq: Once | ORAL | Status: DC | PRN

## 2023-11-25 MED ORDER — METOCLOPRAMIDE HCL 5 MG/ML IJ SOLN: 5.0000 mg | Freq: Three times a day (TID) | INTRAMUSCULAR | Status: DC | PRN

## 2023-11-25 MED ORDER — HYDROMORPHONE HCL 1 MG/ML IJ SOLN: 0.2500 mg | INTRAMUSCULAR | Status: DC | PRN

## 2023-11-25 MED ORDER — VANCOMYCIN HCL 1000 MG IV SOLR
INTRAVENOUS | Status: AC
  Filled 2023-11-25: qty 20

## 2023-11-25 MED ORDER — DEXAMETHASONE SODIUM PHOSPHATE 10 MG/ML IJ SOLN
10.0000 mg | Freq: Once | INTRAMUSCULAR | Status: AC
  Administered 2023-11-26: 10 mg via INTRAVENOUS
  Filled 2023-11-25: qty 1

## 2023-11-25 MED ORDER — PHENYLEPHRINE HCL-NACL 20-0.9 MG/250ML-% IV SOLN
INTRAVENOUS | Status: DC | PRN
  Administered 2023-11-25: 30 ug/min via INTRAVENOUS

## 2023-11-25 MED ORDER — ALUM & MAG HYDROXIDE-SIMETH 200-200-20 MG/5ML PO SUSP: 30.0000 mL | ORAL | Status: DC | PRN

## 2023-11-25 MED ORDER — ASPIRIN 81 MG PO CHEW
81.0000 mg | CHEWABLE_TABLET | Freq: Two times a day (BID) | ORAL | Status: DC
  Administered 2023-11-25 – 2023-11-26 (×2): 81 mg via ORAL
  Filled 2023-11-25 (×2): qty 1

## 2023-11-25 MED ORDER — MAGNESIUM CITRATE PO SOLN: 1.0000 | Freq: Once | ORAL | Status: DC | PRN

## 2023-11-25 MED ORDER — PANTOPRAZOLE SODIUM 40 MG PO TBEC
40.0000 mg | DELAYED_RELEASE_TABLET | Freq: Every day | ORAL | Status: DC
  Administered 2023-11-25 – 2023-11-26 (×2): 40 mg via ORAL
  Filled 2023-11-25 (×2): qty 1

## 2023-11-25 MED ORDER — MENTHOL 3 MG MT LOZG: 1.0000 | LOZENGE | OROMUCOSAL | Status: DC | PRN

## 2023-11-25 MED ORDER — TRANEXAMIC ACID 1000 MG/10ML IV SOLN
INTRAVENOUS | Status: DC | PRN
  Administered 2023-11-25: 2000 mg via TOPICAL

## 2023-11-25 MED ORDER — DEXAMETHASONE SODIUM PHOSPHATE 10 MG/ML IJ SOLN
INTRAMUSCULAR | Status: AC
  Filled 2023-11-25: qty 1

## 2023-11-25 MED ORDER — POLYETHYLENE GLYCOL 3350 17 G PO PACK
17.0000 g | PACK | Freq: Every day | ORAL | Status: DC
  Administered 2023-11-25: 17 g via ORAL
  Filled 2023-11-25: qty 1

## 2023-11-25 MED ORDER — ONDANSETRON HCL 4 MG/2ML IJ SOLN: 4.0000 mg | Freq: Four times a day (QID) | INTRAMUSCULAR | Status: DC | PRN

## 2023-11-25 MED ORDER — 0.9 % SODIUM CHLORIDE (POUR BTL) OPTIME
TOPICAL | Status: DC | PRN
  Administered 2023-11-25: 1000 mL

## 2023-11-25 MED ORDER — ONDANSETRON HCL 4 MG/2ML IJ SOLN
INTRAMUSCULAR | Status: DC | PRN
  Administered 2023-11-25: 4 mg via INTRAVENOUS

## 2023-11-25 MED ORDER — OXYCODONE HCL 5 MG PO TABS
10.0000 mg | ORAL_TABLET | ORAL | Status: DC | PRN
  Administered 2023-11-25 – 2023-11-26 (×2): 10 mg via ORAL
  Filled 2023-11-25: qty 2

## 2023-11-25 MED ORDER — DOCUSATE SODIUM 100 MG PO CAPS
100.0000 mg | ORAL_CAPSULE | Freq: Two times a day (BID) | ORAL | Status: DC
Start: 2023-11-25 — End: 2023-11-26
  Administered 2023-11-25: 100 mg via ORAL
  Filled 2023-11-25 (×2): qty 1

## 2023-11-25 MED ORDER — VANCOMYCIN HCL IN DEXTROSE 1-5 GM/200ML-% IV SOLN
1000.0000 mg | Freq: Once | INTRAVENOUS | Status: AC
  Administered 2023-11-25: 1000 mg via INTRAVENOUS
  Filled 2023-11-25: qty 200

## 2023-11-25 MED ORDER — ACETAMINOPHEN 325 MG PO TABS: 325.0000 mg | ORAL_TABLET | Freq: Four times a day (QID) | ORAL | Status: DC | PRN

## 2023-11-25 MED ORDER — METHOCARBAMOL 500 MG PO TABS
500.0000 mg | ORAL_TABLET | Freq: Four times a day (QID) | ORAL | Status: DC | PRN
Start: 2023-11-25 — End: 2023-11-26
  Administered 2023-11-25 – 2023-11-26 (×3): 500 mg via ORAL
  Filled 2023-11-25 (×4): qty 1

## 2023-11-25 MED ORDER — MEPERIDINE HCL 50 MG/ML IJ SOLN: 6.2500 mg | INTRAMUSCULAR | Status: DC | PRN

## 2023-11-25 MED ORDER — FENTANYL CITRATE (PF) 100 MCG/2ML IJ SOLN
INTRAMUSCULAR | Status: DC | PRN
  Administered 2023-11-25 (×2): 50 ug via INTRAVENOUS

## 2023-11-25 MED ORDER — BUPIVACAINE-MELOXICAM ER 200-6 MG/7ML IJ SOLN
INTRAMUSCULAR | Status: DC | PRN
  Administered 2023-11-25: 200 mg

## 2023-11-25 MED ORDER — HYDROMORPHONE HCL 1 MG/ML IJ SOLN: 0.5000 mg | INTRAMUSCULAR | Status: DC | PRN

## 2023-11-25 MED ORDER — TRANEXAMIC ACID-NACL 1000-0.7 MG/100ML-% IV SOLN
1000.0000 mg | INTRAVENOUS | Status: AC
  Administered 2023-11-25: 1000 mg via INTRAVENOUS
  Filled 2023-11-25: qty 100

## 2023-11-25 MED ORDER — BUPIVACAINE-MELOXICAM ER 200-6 MG/7ML IJ SOLN
INTRAMUSCULAR | Status: AC
  Filled 2023-11-25: qty 1

## 2023-11-25 MED ORDER — LIDOCAINE HCL (CARDIAC) PF 100 MG/5ML IV SOSY
PREFILLED_SYRINGE | INTRAVENOUS | Status: DC | PRN
  Administered 2023-11-25: 50 mg via INTRAVENOUS

## 2023-11-25 MED ORDER — ISOPROPYL ALCOHOL 70 % SOLN
Status: DC | PRN
  Administered 2023-11-25: 1 via TOPICAL

## 2023-11-25 MED ORDER — PHENOL 1.4 % MT LIQD: 1.0000 | OROMUCOSAL | Status: DC | PRN

## 2023-11-25 MED ORDER — PROPOFOL 1000 MG/100ML IV EMUL
INTRAVENOUS | Status: AC
  Filled 2023-11-25: qty 100

## 2023-11-25 MED ORDER — ACETAMINOPHEN 500 MG PO TABS
1000.0000 mg | ORAL_TABLET | Freq: Once | ORAL | Status: AC
  Administered 2023-11-25: 1000 mg via ORAL
  Filled 2023-11-25: qty 2

## 2023-11-25 MED ORDER — OXYCODONE HCL 5 MG/5ML PO SOLN: 5.0000 mg | Freq: Once | ORAL | Status: DC | PRN

## 2023-11-25 MED ORDER — VANCOMYCIN HCL 1 G IV SOLR
INTRAVENOUS | Status: DC | PRN
  Administered 2023-11-25: 1000 mg via TOPICAL

## 2023-11-25 MED ORDER — CHLORHEXIDINE GLUCONATE 4 % EX SOLN
1.0000 | CUTANEOUS | 1 refills | Status: DC
  Filled 2023-11-25: qty 946, 30d supply, fill #0
  Filled 2023-12-20: qty 946, 30d supply, fill #1

## 2023-11-25 MED ORDER — METHOCARBAMOL 1000 MG/10ML IJ SOLN: 500.0000 mg | Freq: Four times a day (QID) | INTRAMUSCULAR | Status: DC | PRN

## 2023-11-25 MED ORDER — SODIUM CHLORIDE 0.9 % IR SOLN
Status: DC | PRN
  Administered 2023-11-25: 1000 mL

## 2023-11-25 SURGICAL SUPPLY — 43 items
BAG COUNTER SPONGE SURGICOUNT (BAG) IMPLANT
BAG ZIPLOCK 12X15 (MISCELLANEOUS) ×1 IMPLANT
BLADE SAG 18X100X1.27 (BLADE) ×1 IMPLANT
BLADE SAW SAG 35X64 .89 (BLADE) ×1 IMPLANT
CLSR STERI-STRIP ANTIMIC 1/2X4 (GAUZE/BANDAGES/DRESSINGS) IMPLANT
COVER PERINEAL POST (MISCELLANEOUS) ×1 IMPLANT
COVER SURGICAL LIGHT HANDLE (MISCELLANEOUS) ×1 IMPLANT
DERMABOND ADVANCED .7 DNX12 (GAUZE/BANDAGES/DRESSINGS) IMPLANT
DRAPE IMP U-DRAPE 54X76 (DRAPES) ×1 IMPLANT
DRAPE POUCH INSTRU U-SHP 10X18 (DRAPES) ×1 IMPLANT
DRAPE U-SHAPE 47X51 STRL (DRAPES) ×2 IMPLANT
DRESSING AQUACEL AG SP 3.5X6 (GAUZE/BANDAGES/DRESSINGS) ×1 IMPLANT
DRSG AQUACEL AG ADV 3.5X10 (GAUZE/BANDAGES/DRESSINGS) ×1 IMPLANT
DURAPREP 26ML APPLICATOR (WOUND CARE) ×2 IMPLANT
ELECT PENCIL ROCKER SW 15FT (MISCELLANEOUS) ×1 IMPLANT
ELECT REM PT RETURN 15FT ADLT (MISCELLANEOUS) ×1 IMPLANT
GAUZE SPONGE 4X4 12PLY STRL LF (GAUZE/BANDAGES/DRESSINGS) ×2 IMPLANT
GLOVE BIOGEL PI IND STRL 7.0 (GLOVE) ×1 IMPLANT
GLOVE BIOGEL PI IND STRL 7.5 (GLOVE) ×1 IMPLANT
GLOVE ECLIPSE 7.0 STRL STRAW (GLOVE) ×3 IMPLANT
GLOVE SURG SYN 7.5 E (GLOVE) ×2 IMPLANT
GLOVE SURG SYN 7.5 PF PI (GLOVE) ×2 IMPLANT
GOWN SRG XL LVL 4 BRTHBL STRL (GOWNS) ×2 IMPLANT
HEAD M SROM 36MM PLUS 1.5 (Hips) IMPLANT
HOOD PEEL AWAY T7 (MISCELLANEOUS) ×3 IMPLANT
KIT TURNOVER KIT A (KITS) ×1 IMPLANT
LINER NEUTRAL 52X36MM PLUS 4 (Liner) IMPLANT
MARKER SKIN DUAL TIP RULER LAB (MISCELLANEOUS) ×1 IMPLANT
NDL SPNL 18GX3.5 QUINCKE PK (NEEDLE) ×1 IMPLANT
NEEDLE SPNL 18GX3.5 QUINCKE PK (NEEDLE) ×1 IMPLANT
PACK ANTERIOR HIP CUSTOM (KITS) ×1 IMPLANT
PIN SECTOR W/GRIP ACE CUP 52MM (Hips) IMPLANT
SET HNDPC FAN SPRY TIP SCT (DISPOSABLE) ×1 IMPLANT
SOLUTION PRONTOSAN WOUND 350ML (IRRIGATION / IRRIGATOR) ×1 IMPLANT
STEM FEM ACTIS HIGH SZ7 (Stem) IMPLANT
SUT ETHIBOND 2 V 37 (SUTURE) ×1 IMPLANT
SUT MNCRL AB 3-0 PS2 18 (SUTURE) IMPLANT
SUT NYLON 3 0 (SUTURE) IMPLANT
SUT STRATAFIX PDS+ 0 24IN (SUTURE) ×1 IMPLANT
SUT VIC AB 0 CT1 36 (SUTURE) IMPLANT
SUT VIC AB 2-0 CT1 TAPERPNT 27 (SUTURE) ×2 IMPLANT
TRAY FOLEY MTR SLVR 16FR STAT (SET/KITS/TRAYS/PACK) IMPLANT
TUBE SUCTION HIGH CAP CLEAR NV (SUCTIONS) ×1 IMPLANT

## 2023-11-25 NOTE — Discharge Instructions (Signed)

## 2023-11-25 NOTE — Anesthesia Procedure Notes (Signed)
 Spinal  Patient location during procedure: OR End time: 11/25/2023 10:52 AM Reason for block: surgical anesthesia Staffing Performed: anesthesiologist  Anesthesiologist: Leonce Athens, MD Performed by: Leonce Athens, MD Authorized by: Leonce Athens, MD   Preanesthetic Checklist Completed: patient identified, IV checked, site marked, risks and benefits discussed, surgical consent, monitors and equipment checked, pre-op evaluation and timeout performed Spinal Block Patient position: sitting Prep: DuraPrep and site prepped and draped Patient monitoring: blood pressure, continuous pulse ox, cardiac monitor and heart rate Approach: midline Location: L3-4 Injection technique: single-shot Needle Needle type: Pencan and Introducer  Needle gauge: 25 G Needle length: 9 cm Assessment Events: CSF return Additional Notes Pt identified in Operating room.  Monitors applied. Working IV access confirmed. Sterile prep, drape lumbar spine.  1% lido local L 3,4.  #24ga Pencan into clear CSF L 3,4.  13.5 mg 0.75% Bupivacaine  with dextrose  injected with asp CSF beginning and end of injection.  Patient asymptomatic, VSS, no heme aspirated, tolerated well.  Manuel Landry Leonce, MD

## 2023-11-25 NOTE — Anesthesia Postprocedure Evaluation (Signed)
 Anesthesia Post Note  Patient: Manuel Landry  Procedure(s) Performed: LEFT TOTAL HIP ARTHROPLASTY ANTERIOR APPROACH (Left: Hip)     Patient location during evaluation: PACU Anesthesia Type: Spinal Level of consciousness: oriented, awake and alert and patient cooperative Pain management: pain level controlled Vital Signs Assessment: post-procedure vital signs reviewed and stable Respiratory status: spontaneous breathing, respiratory function stable and nonlabored ventilation Cardiovascular status: blood pressure returned to baseline and stable Postop Assessment: no headache, no backache, no apparent nausea or vomiting, spinal receding and patient able to bend at knees Anesthetic complications: no   No notable events documented.  Last Vitals:  Vitals:   11/25/23 1254 11/25/23 1300  BP: 120/74 104/76  Pulse: 66 75  Resp: 20 18  Temp: (!) 36.1 C   SpO2: 100% 94%    Last Pain:  Vitals:   11/25/23 1300  TempSrc:   PainSc: 0-No pain                 Aryelle Figg,E. Shekera Beavers

## 2023-11-25 NOTE — Transfer of Care (Signed)
 Immediate Anesthesia Transfer of Care Note  Patient: Manuel Landry  Procedure(s) Performed: LEFT TOTAL HIP ARTHROPLASTY ANTERIOR APPROACH (Left: Hip)  Patient Location: PACU  Anesthesia Type:Spinal  Level of Consciousness: awake, alert , oriented, and patient cooperative  Airway & Oxygen Therapy: Patient Spontanous Breathing and Patient connected to face mask oxygen  Post-op Assessment: Report given to RN and Post -op Vital signs reviewed and stable  Post vital signs: Reviewed and stable  Last Vitals:  Vitals Value Taken Time  BP 120/74 11/25/23 12:53  Temp    Pulse 68 11/25/23 12:56  Resp 11 11/25/23 12:56  SpO2 99 % 11/25/23 12:56  Vitals shown include unfiled device data.  Last Pain:  Vitals:   11/25/23 0702  TempSrc: Oral         Complications: No notable events documented.

## 2023-11-25 NOTE — Anesthesia Preprocedure Evaluation (Addendum)
 Anesthesia Evaluation  Patient identified by MRN, date of birth, ID band Patient awake    Reviewed: Allergy & Precautions, NPO status , Patient's Chart, lab work & pertinent test results  History of Anesthesia Complications (+) PONV  Airway Mallampati: II  TM Distance: >3 FB Neck ROM: Full    Dental  (+) Caps, Dental Advisory Given   Pulmonary neg pulmonary ROS   breath sounds clear to auscultation       Cardiovascular (-) angina  Rhythm:Regular Rate:Normal  '22 ECHO: EF 55 to 60%. 1. The LV has normal function, no regional wall motion abnormalities. There is mild LVH  2. RVF is normal. The right ventricular size is mildly enlarged. There is normal pulmonary artery systolic pressure. The estimated right ventricular systolic pressure is 27.8 mmHg.   3. The mitral valve is normal in structure. Mild mitral valve regurgitation. No evidence of mitral stenosis.   4. The aortic valve is tricuspid. Aortic valve regurgitation is trivial. Aortic valve sclerosis/calcification is present, without any evidence of aortic stenosis.     Neuro/Psych   Anxiety     negative neurological ROS     GI/Hepatic Neg liver ROS,GERD  Medicated and Controlled,,  Endo/Other  negative endocrine ROS    Renal/GU negative Renal ROS     Musculoskeletal  (+) Arthritis ,    Abdominal   Peds  Hematology Hb 15, plt 221k   Anesthesia Other Findings Prostate cancer  Reproductive/Obstetrics                              Anesthesia Physical Anesthesia Plan  ASA: 2  Anesthesia Plan: Spinal   Post-op Pain Management: Tylenol  PO (pre-op)*   Induction:   PONV Risk Score and Plan: 2 and Ondansetron  and Treatment may vary due to age or medical condition  Airway Management Planned: Natural Airway and Simple Face Mask  Additional Equipment: None  Intra-op Plan:   Post-operative Plan:   Informed Consent: I have reviewed the  patients History and Physical, chart, labs and discussed the procedure including the risks, benefits and alternatives for the proposed anesthesia with the patient or authorized representative who has indicated his/her understanding and acceptance.     Dental advisory given  Plan Discussed with: CRNA and Surgeon  Anesthesia Plan Comments:          Anesthesia Quick Evaluation

## 2023-11-25 NOTE — Plan of Care (Signed)
  Problem: Education: Goal: Knowledge of General Education information will improve Description: Including pain rating scale, medication(s)/side effects and non-pharmacologic comfort measures Outcome: Progressing   Problem: Health Behavior/Discharge Planning: Goal: Ability to manage health-related needs will improve Outcome: Progressing   Problem: Clinical Measurements: Goal: Ability to maintain clinical measurements within normal limits will improve Outcome: Progressing Goal: Will remain free from infection Outcome: Progressing Goal: Diagnostic test results will improve Outcome: Progressing Goal: Respiratory complications will improve Outcome: Progressing Goal: Cardiovascular complication will be avoided Outcome: Progressing   Problem: Activity: Goal: Risk for activity intolerance will decrease Outcome: Progressing   Problem: Nutrition: Goal: Adequate nutrition will be maintained Outcome: Progressing   Problem: Coping: Goal: Level of anxiety will decrease Outcome: Progressing   Problem: Elimination: Goal: Will not experience complications related to bowel motility Outcome: Progressing Goal: Will not experience complications related to urinary retention Outcome: Progressing   Problem: Pain Managment: Goal: General experience of comfort will improve and/or be controlled Outcome: Progressing   Problem: Safety: Goal: Ability to remain free from injury will improve Outcome: Progressing   Problem: Skin Integrity: Goal: Risk for impaired skin integrity will decrease Outcome: Progressing   Problem: Education: Goal: Knowledge of the prescribed therapeutic regimen will improve Outcome: Progressing Goal: Understanding of discharge needs will improve Outcome: Progressing Goal: Individualized Educational Video(s) Outcome: Progressing   Problem: Activity: Goal: Ability to avoid complications of mobility impairment will improve Outcome: Progressing Goal: Ability to  tolerate increased activity will improve Outcome: Progressing   Problem: Clinical Measurements: Goal: Postoperative complications will be avoided or minimized Outcome: Progressing   Problem: Pain Management: Goal: Pain level will decrease with appropriate interventions Outcome: Progressing   Problem: Skin Integrity: Goal: Will show signs of wound healing Outcome: Progressing   Problem: Education: Goal: Knowledge of General Education information will improve Description: Including pain rating scale, medication(s)/side effects and non-pharmacologic comfort measures Outcome: Progressing   Problem: Health Behavior/Discharge Planning: Goal: Ability to manage health-related needs will improve Outcome: Progressing   Problem: Clinical Measurements: Goal: Ability to maintain clinical measurements within normal limits will improve Outcome: Progressing Goal: Will remain free from infection Outcome: Progressing Goal: Diagnostic test results will improve Outcome: Progressing Goal: Respiratory complications will improve Outcome: Progressing Goal: Cardiovascular complication will be avoided Outcome: Progressing   Problem: Activity: Goal: Risk for activity intolerance will decrease Outcome: Progressing   Problem: Nutrition: Goal: Adequate nutrition will be maintained Outcome: Progressing   Problem: Coping: Goal: Level of anxiety will decrease Outcome: Progressing   Problem: Elimination: Goal: Will not experience complications related to bowel motility Outcome: Progressing Goal: Will not experience complications related to urinary retention Outcome: Progressing   Problem: Pain Managment: Goal: General experience of comfort will improve and/or be controlled Outcome: Progressing   Problem: Safety: Goal: Ability to remain free from injury will improve Outcome: Progressing   Problem: Skin Integrity: Goal: Risk for impaired skin integrity will decrease Outcome: Progressing

## 2023-11-25 NOTE — H&P (Signed)
 PREOPERATIVE H&P  Chief Complaint: left hip osteoarthritis  HPI: Manuel Landry is a 78 y.o. male who presents for surgical treatment of left hip osteoarthritis.  He denies any changes in medical history.  Past Surgical History:  Procedure Laterality Date   bone spur Right    right foot   CATARACT EXTRACTION Bilateral    COLONOSCOPY  last one 10-21-2016  dr albertus   CYSTOSCOPY WITH INSERTION OF UROLIFT N/A 12/15/2017   Procedure: CYSTOSCOPY WITH INSERTION OF UROLIFT, BIOPSY  PROSTATIC URETHAL;  Surgeon: Matilda Senior, MD;  Location: [facility];  Service: Urology;  Laterality: N/A;   ESOPHAGOGASTRODUODENOSCOPY (EGD) WITH PROPOFOL   last one 09-26-2013   dr pyrtle   INGUINAL HERNIA REPAIR Bilateral right 10-25-1997:  left 09-28-2008 (dr m. gladis @ [facility])   LAPAROSCOPIC CHOLECYSTECTOMY  2012   MANDIBLE SURGERY Bilateral 1990s   removal calcified bone growths   ROTATOR CUFF REPAIR Right 03-26-2009   dr duwayne  [facility]   SHOULDER OPEN ROTATOR CUFF REPAIR  03/17/2012   Procedure: ROTATOR CUFF REPAIR SHOULDER OPEN;  Surgeon: Reyes JAYSON duwayne, MD;  Location: [facility];  Service: Orthopedics;  Laterality: Left;  LEFT MINI OPEN ROTATOR CUFF REPAIR    SPACE OAR INSTILLATION N/A 12/15/2017   Procedure: SPACE OAR INSTILLATION;  Surgeon: Matilda Senior, MD;  Location[facility];  Service: Urology;  Laterality: N/A;   TONSILLECTOMY  child   UPPER GASTROINTESTINAL ENDOSCOPY     VASECTOMY     Social History   Socioeconomic History   Marital status: Married    Spouse name: Montie   Number of children: 0   Years of education: Not on file   Highest education level: Bachelor's degree (e.g., BA, AB, BS)  Occupational History   Occupation: Retired    Associate Professor: RETIRED  Tobacco Use   Smoking status: Never   Smokeless tobacco: Never   Tobacco comments:    Never smoke 11/13/21  Vaping Use   Vaping status: Never Used  Substance and Sexual Activity    Alcohol  use: Yes    Alcohol /week: 2.0 standard drinks of alcohol     Types: 2 Glasses of wine per week    Comment: occasional   Drug use: Never   Sexual activity: Not Currently    Birth control/protection: None    Comment: vasectomy 2011  Other Topics Concern   Not on file  Social History Narrative   Not on file   Social Drivers of Health   Financial Resource Strain: Low Risk  (06/29/2023)   Overall Financial Resource Strain (CARDIA)    Difficulty of Paying Living Expenses: Not hard at all  Food Insecurity: No Food Insecurity (06/29/2023)   Hunger Vital Sign    Worried About Running Out of Food in the Last Year: Never true    Ran Out of Food in the Last Year: Never true  Transportation Needs: No Transportation Needs (06/29/2023)   PRAPARE - Administrator, Civil Service (Medical): No    Lack of Transportation (Non-Medical): No  Physical Activity: Insufficiently Active (06/29/2023)   Exercise Vital Sign    Days of Exercise per Week: 7 days    Minutes of Exercise per Session: 20 min  Stress: No Stress Concern Present (06/29/2023)   [employer] of Occupational Health - Occupational Stress Questionnaire    Feeling of Stress : Not at all  Social Connections: Unknown (06/29/2023)   Social Connection and Isolation Panel    Frequency of  Communication with Friends and Family: Three times a week    Frequency of Social Gatherings with Friends and Family: Once a week    Attends Religious Services: Patient declined    Database administrator or Organizations: Yes    Attends Engineer, structural: 1 to 4 times per year    Marital Status: Married   Family History  Problem Relation Age of Onset   Colon cancer Mother 28   Cancer Mother    Diabetes Maternal Grandmother    COPD Sister    Heart failure Father 57   Esophageal cancer Neg Hx    Rectal cancer Neg Hx    Stomach cancer Neg Hx    Allergies  Allergen Reactions   Gabapentin      Caused drop in blood  pressure and loss of consciousness   Prior to Admission medications   Medication Sig Start Date End Date Taking? Authorizing Provider  azelastine  (ASTELIN ) 0.1 % nasal spray Place 1 spray into both nostrils 2 (two) times daily. Use in each nostril as directed Patient taking differently: Place 1 spray into both nostrils 2 (two) times daily as needed for allergies. Use in each nostril as directed 03/03/22  Yes Wendling, Mabel Mt, DO  diphenhydrAMINE  (BENADRYL ) 25 MG tablet Take 25 mg by mouth at bedtime as needed for allergies.   Yes [provider]  docusate sodium  (COLACE) 100 MG capsule Take 200 mg by mouth 2 (two) times daily as needed (constipation.).   Yes [provider]  fluticasone  (FLONASE ) 50 MCG/ACT nasal spray SPRAY 2 SPRAYS INTO EACH NOSTRIL EVERY DAY Patient taking differently: Place 1 spray into both nostrils daily as needed for allergies. 04/13/23  Yes Frann Mabel Mt, DO  MAGNESIUM  PO Take 1 capsule by mouth in the morning.   Yes [provider]  Misc Natural Products (NEURIVA PO) Take 1 capsule by mouth in the morning.   Yes [provider]  Multiple Vitamins-Minerals (OCUVITE ADULT 50+) CAPS Take 1 capsule by mouth in the morning.   Yes [provider]  saccharomyces boulardii (FLORASTOR) 250 MG capsule Take 250 mg by mouth in the morning.   Yes [provider]  sertraline  (ZOLOFT ) 50 MG tablet Take 1 tablet (50 mg total) by mouth daily. 06/11/23  Yes Frann Mabel Mt, DO  tadalafil  (CIALIS ) 5 MG tablet Take 1 tablet (5 mg total) by mouth daily. Patient taking differently: Take 5 mg by mouth daily with supper. 03/09/23  Yes   aspirin  (ASPIRIN  81) 81 MG chewable tablet Chew 1 tablet (81 mg total) by mouth 2 (two) times daily. To be taken after surgery to prevent blood clots. 11/18/23   Jule Ronal CROME, PA-C  docusate sodium  (COLACE) 100 MG capsule Take 1 capsule (100 mg total) by mouth daily as needed. 11/18/23  11/17/24  Jule Ronal CROME, PA-C  methocarbamol  (ROBAXIN ) 500 MG tablet Take 1 tablet (500 mg total) by mouth 2 (two) times daily as needed. 11/18/23   Jule Ronal CROME, PA-C  omeprazole  (PRILOSEC) 20 MG capsule Take 20 mg by mouth every other day. Takes Mon,Wed, Fri    [provider]  ondansetron  (ZOFRAN ) 4 MG tablet Take 1 tablet (4 mg total) by mouth every 8 (eight) hours as needed for nausea or vomiting. 11/18/23   Jule Ronal CROME, PA-C  oxyCODONE -acetaminophen  (PERCOCET) 5-325 MG tablet Take 1-2 tablets by mouth every 6 (six) hours as needed. To be taken after surgery. 11/18/23   Jule Ronal CROME, PA-C  Positive ROS: All other systems have been reviewed and were otherwise negative with the exception of those mentioned in the HPI and as above.  Physical Exam: General: Alert, no acute distress Cardiovascular: No pedal edema Respiratory: No cyanosis, no use of accessory musculature GI: abdomen soft Skin: No lesions in the area of chief complaint Neurologic: Sensation intact distally Psychiatric: Patient is competent for consent with normal mood and affect Lymphatic: no lymphedema  MUSCULOSKELETAL: exam stable  Assessment: left hip osteoarthritis  Plan: Plan for Procedure(s): LEFT TOTAL HIP ARTHROPLASTY ANTERIOR APPROACH  The risks benefits and alternatives were discussed with the patient including but not limited to the risks of nonoperative treatment, versus surgical intervention including infection, bleeding, nerve injury,  blood clots, cardiopulmonary complications, morbidity, mortality, among others, and they were willing to proceed.   Ozell Cummins, MD 11/25/2023 7:06 AM

## 2023-11-25 NOTE — Op Note (Signed)
 LEFT TOTAL HIP ARTHROPLASTY ANTERIOR APPROACH  Procedure Note Manuel Landry   [id number]  Pre-op Diagnosis: left hip osteoarthritis     Post-op Diagnosis: same  Operative Findings Severe DJD Joint effusion and synovitis   Operative Procedures  1. Total hip replacement; Left hip; uncemented [id number]   Surgeon: Kay Cummins, M.D.  Assist: Ivin Pesa, RNFA   Anesthesia: spinal  Prosthesis: Depuy Acetabulum: Pinnacle 52 mm Femur: Actis 7 HO Head: 36 mm size: +1.5 Liner: +4 neutral Bearing Type: metal/poly  Total Hip Arthroplasty (Anterior Approach) Op Note:  After informed consent was obtained and the operative extremity marked in the holding area, the patient was brought back to the operating room and placed supine on the HANA table. Next, the operative extremity was prepped and draped in normal sterile fashion. Surgical timeout occurred verifying patient identification, surgical site, surgical procedure and administration of antibiotics.  A bikini incision was made over the anterior hip.  A Hueter approach to the hip was performed, using the interval between tensor fascia lata and sartorius.  Dissection was carried bluntly down onto the anterior hip capsule. The lateral femoral circumflex vessels were identified and coagulated. A capsulotomy was performed and the capsular flaps tagged for later repair.  The neck osteotomy was performed 1 fingerbreadth above the lesser trochanter. The femoral head was removed which showed severe wear, the acetabular rim was cleared of soft tissue and osteophytes and attention was turned to reaming the acetabulum.  Sequential reaming was performed under fluoroscopic guidance down to the floor of the cotyloid fossa. We reamed to a size 52 mm, and then impacted the acetabular shell which gave excellent fixation.  A +4 neutral liner was then placed after irrigation and attention turned to the femur.  After placing the femoral hook, the leg was taken  to externally rotated, extended and adducted position taking care to perform soft tissue releases to allow for adequate mobilization of the femur. Soft tissue was cleared from the shoulder of the greater trochanter and the hook elevator used to improve exposure of the proximal femur.  Lateral bone from the shoulder was rasped away for relief.  Sequential broaching performed up to a size 7.  Standard trial neck and +1.5 head were placed. The leg was brought back up to neutral and the construct reduced.  The position and sizing of components, offset and leg lengths were checked using fluoroscopy. Stability of the construct was checked in 45 degrees of hip extension and 90 degrees of external rotation without any subluxation, shuck or impingement of prosthesis.  He needed slightly more offset than the standard neck.  Leg lengths were equal.  We dislocated the prosthesis, dropped the leg back into position, removed trial components, and irrigated copiously. The final high offset stem and head was then placed, the leg brought back up, the system reduced and fluoroscopy used to verify positioning.  Antibiotic irrigation was placed in the surgical wound.   We irrigated, obtained hemostasis and closed the capsule using #2 ethibond suture.  A topical mixture of 0.25% bupivacaine  and meloxicam  was placed deep to the fascia.  One gram of vancomycin  powder was placed in the surgical bed.   One gram of topical tranexamic acid  was injected into the joint.  The fascia was closed with #1 stratafix, the deep fat layer was closed with 0 vicryl, the subcutaneous layers closed with 2.0 Vicryl Plus and the skin closed with 2.0 nylon and dermabond. A sterile dressing was applied. The patient was  awakened in the operating room and taken to recovery in stable condition.  All sponge, needle, and instrument counts were correct at the end of the case.   Position: supine  Complications: see description of procedure.  Time Out: performed    Drains/Packing: none  Estimated blood loss: see anesthesia record  Returned to Recovery Room: in good condition.   Antibiotics: yes   Mechanical VTE (DVT) Prophylaxis: sequential compression devices, TED thigh-high  Chemical VTE (DVT) Prophylaxis: aspirin    Fluid Replacement: see anesthesia record  Specimens Removed: 1 to pathology   Sponge and Instrument Count Correct? yes   PACU: portable radiograph - low AP   Plan/RTC: Return in 2 weeks for suture removal. Weight Bearing/Load Lower Extremity: full  Hip precautions: none Suture Removal: 2 weeks   N. Ozell Cummins, MD [facility] 12:19 PM   Implant Name Type Inv. Item Serial No. Manufacturer Lot No. LRB No. Used Action  PIN SECTOR W/GRIP ACE CUP - [id number] Hips PIN SECTOR W/GRIP ACE CUP  [employer] [id number] Left 1 Implanted  LINER NEUTRAL [id number] PLUS 4 - [id number] Liner LINER NEUTRAL [id number] PLUS 4  [employer] [id number] Left 1 Implanted  STEM FEM ACTIS HIGH SZ7 - [id number] Stem STEM FEM ACTIS HIGH SZ7  [employer] [id number] Left 1 Implanted  HEAD M SROM PLUS 1.5 - [id number] Hips HEAD M SROM PLUS 1.5  [employer] [id number] Left 1 Implanted

## 2023-11-26 ENCOUNTER — Encounter (HOSPITAL_COMMUNITY): Payer: Self-pay | Admitting: Orthopaedic Surgery

## 2023-11-26 DIAGNOSIS — Z8546 Personal history of malignant neoplasm of prostate: Secondary | ICD-10-CM | POA: Diagnosis not present

## 2023-11-26 DIAGNOSIS — M1612 Unilateral primary osteoarthritis, left hip: Secondary | ICD-10-CM | POA: Diagnosis not present

## 2023-11-26 DIAGNOSIS — Z79899 Other long term (current) drug therapy: Secondary | ICD-10-CM | POA: Diagnosis not present

## 2023-11-26 DIAGNOSIS — Z7982 Long term (current) use of aspirin: Secondary | ICD-10-CM | POA: Diagnosis not present

## 2023-11-26 NOTE — Discharge Summary (Signed)
 Patient ID: Manuel Landry MRN: [id number] DOB/AGE: [birth year] 78 y.o.  Admit date: 11/25/2023 Discharge date: 11/26/2023  Admission Diagnoses:  Primary osteoarthritis of left hip  Discharge Diagnoses:  Principal Problem:   Primary osteoarthritis of left hip Active Problems:   Status post total replacement of left hip   Past Medical History:  Diagnosis Date   Allergy    Anxiety    Arthritis    Blind loop syndrome    Bulging discs    LOWER BACK   Cataract    beginnings of   Diverticulosis    Family history of malignant neoplasm of gastrointestinal tract    History of adenomatous polyp of colon    History of Barrett's esophagus    History of chronic gastritis    Hyperlipidemia    no meds taken   IBS (irritable bowel syndrome)    Internal hemorrhoids    Malignant neoplasm of prostate [facility]) urologist-  dr dahlstedt/  oncologist-  dr patrcia   dx 08-09-2017-- Stage T2a, Gleason 4+5, PSA 3.22 (on finateride)--- treatment ADT and external beam radiation   PONV (postoperative nausea and vomiting)    Seasonal allergic rhinitis    Tubular adenoma of colon    Wears glasses     Surgeries: Procedure(s): LEFT TOTAL HIP ARTHROPLASTY ANTERIOR APPROACH on 11/25/2023   Consultants (if any):   Discharged Condition: Improved  Hospital Course: Manuel Landry is an 78 y.o. male who was admitted 11/25/2023 with a diagnosis of Primary osteoarthritis of left hip and went to the operating room on 11/25/2023 and underwent the above named procedures.    He was given perioperative antibiotics:  Anti-infectives (From admission, onward)    Start     Dose/Rate Route Frequency Ordered Stop   11/25/23 1700  ceFAZolin  (ANCEF ) IVPB 2g/100 mL premix        2 g 200 mL/hr over 30 Minutes Intravenous Every 6 hours 11/25/23 1544 11/26/23 0024   11/25/23 1131  vancomycin  (VANCOCIN ) powder  Status:  Discontinued          As needed 11/25/23 1132 11/25/23 1251   11/25/23 0700  ceFAZolin   (ANCEF ) IVPB 2g/100 mL premix        2 g 200 mL/hr over 30 Minutes Intravenous On call to O.R. 11/25/23 0650 11/25/23 1101   11/25/23 0700  vancomycin  (VANCOCIN ) IVPB 1000 mg/200 mL premix        1,000 mg 200 mL/hr over 60 Minutes Intravenous  Once 11/25/23 0650 11/25/23 0836     .  He was given sequential compression devices, early ambulation, and appropriate chemoprophylaxis for DVT prophylaxis.  He benefited maximally from the hospital stay and there were no complications.    Recent vital signs:  Vitals:   11/26/23 0108 11/26/23 0549  BP: 125/70 (!) 148/82  Pulse: 76 85  Resp: 16 15  Temp: 98.2 F (36.8 C) 98.6 F (37 C)  SpO2: 95% 96%    Recent laboratory studies:  Lab Results  Component Value Date   HGB 15.0 11/17/2023   HGB 14.5 06/09/2022   HGB 15.3 10/19/2021   Lab Results  Component Value Date   WBC 5.4 11/17/2023   PLT 221 11/17/2023   No results found for: INR Lab Results  Component Value Date   NA 132 (L) 11/17/2023   K 5.1 11/17/2023   CL 98 11/17/2023   CO2 25 11/17/2023   BUN 11 11/17/2023   CREATININE 0.95 11/17/2023   GLUCOSE 101 (H) 11/17/2023  Discharge Medications:   Allergies as of 11/26/2023       Reactions   Gabapentin     Caused drop in blood pressure and loss of consciousness        Medication List     TAKE these medications    Aspirin  Low Dose 81 MG chewable tablet Generic drug: aspirin  Chew 1 tablet (81 mg total) by mouth 2 (two) times daily. To be taken after surgery to prevent blood clots.   azelastine  0.1 % nasal spray Commonly known as: ASTELIN  Place 1 spray into both nostrils 2 (two) times daily. Use in each nostril as directed What changed:  when to take this reasons to take this   chlorhexidine  4 % external liquid Commonly known as: HIBICLENS  Apply 15 mLs (1 Application total) topically as directed for 30 doses. Use as directed daily for 5 days every other week for 6 weeks.   diphenhydrAMINE  25 MG  tablet Commonly known as: BENADRYL  Take 25 mg by mouth at bedtime as needed for allergies.   docusate sodium  100 MG capsule Commonly known as: COLACE Take 200 mg by mouth 2 (two) times daily as needed (constipation.).   docusate sodium  100 MG capsule Commonly known as: Colace Take 1 capsule (100 mg total) by mouth daily as needed.   Florastor 250 MG capsule Generic drug: saccharomyces boulardii Take 250 mg by mouth in the morning.   fluticasone  50 MCG/ACT nasal spray Commonly known as: FLONASE  SPRAY 2 SPRAYS INTO EACH NOSTRIL EVERY DAY What changed: See the new instructions.   MAGNESIUM  PO Take 1 capsule by mouth in the morning.   methocarbamol  500 MG tablet Commonly known as: ROBAXIN  Take 1 tablet (500 mg total) by mouth 2 (two) times daily as needed.   mupirocin  ointment 2 % Commonly known as: BACTROBAN  Place 1 Application into the nose 2 (two) times daily for 60 doses. Use as directed 2 times daily for 5 days every other week for 6 weeks.   NEURIVA PO Take 1 capsule by mouth in the morning.   Ocuvite Adult 50+ Caps Take 1 capsule by mouth in the morning.   omeprazole  20 MG capsule Commonly known as: PRILOSEC Take 20 mg by mouth every other day. Takes Mon,Wed, Fri   ondansetron  4 MG tablet Commonly known as: Zofran  Take 1 tablet (4 mg total) by mouth every 8 (eight) hours as needed for nausea or vomiting.   oxyCODONE -acetaminophen  5-325 MG tablet Commonly known as: Percocet Take 1-2 tablets by mouth every 6 (six) hours as needed. To be taken after surgery.   sertraline  50 MG tablet Commonly known as: ZOLOFT  Take 1 tablet (50 mg total) by mouth daily.   tadalafil  5 MG tablet Commonly known as: Cialis  Take 1 tablet (5 mg total) by mouth daily. What changed: when to take this               Durable Medical Equipment  (From admission, onward)           Start     Ordered   11/25/23 1545  DME Walker rolling  Once       Question:  Patient needs a  walker to treat with the following condition  Answer:  History of hip replacement   11/25/23 1544   11/25/23 1545  DME 3 n 1  Once        11/25/23 1544   11/25/23 1545  DME Bedside commode  Once       Question:  Patient needs  a bedside commode to treat with the following condition  Answer:  History of hip replacement   11/25/23 1544            Diagnostic Studies: DG Pelvis Portable Result Date: 11/25/2023 CLINICAL DATA:  Status post hip arthroplasty. EXAM: PORTABLE PELVIS 1-2 VIEWS COMPARISON:  None Available. FINDINGS: Left hip arthroplasty in expected alignment. No periprosthetic lucency or fracture. Recent postsurgical change includes air and edema in the soft tissues. Chronic right hip arthropathy. IMPRESSION: Left hip arthroplasty without immediate postoperative complication. Electronically Signed   By: Andrea Gasman M.D.   On: 11/25/2023 14:44   DG HIP UNILAT WITH PELVIS 1V LEFT Result Date: 11/25/2023 CLINICAL DATA:  Elective surgery. EXAM: DG HIP (WITH OR WITHOUT PELVIS) 1V*L* COMPARISON:  Preoperative radiograph FINDINGS: Eight fluoroscopic spot views of the pelvis and left hip obtained in the operating room. Sequential images during hip arthroplasty. Fluoroscopy time 21 seconds. Dose 4.3 mGy. IMPRESSION: Intraoperative fluoroscopy during left hip arthroplasty. Electronically Signed   By: Andrea Gasman M.D.   On: 11/25/2023 14:43   DG C-Arm 1-60 Min-No Report Result Date: 11/25/2023 Fluoroscopy was utilized by the requesting physician.  No radiographic interpretation.   DG C-Arm 1-60 Min-No Report Result Date: 11/25/2023 Fluoroscopy was utilized by the requesting physician.  No radiographic interpretation.    Disposition: Discharge disposition: 01-Home or Self Care       Discharge Instructions     Call MD / Call 911   Complete by: As directed    If you experience chest pain or shortness of breath, CALL 911 and be transported to the hospital emergency room.  If you  develope a fever above 101.5 F, pus (white drainage) or increased drainage or redness at the wound, or calf pain, call your surgeon's office.   Constipation Prevention   Complete by: As directed    Drink plenty of fluids.  Prune juice may be helpful.  You may use a stool softener, such as Colace (over the counter) 100 mg twice a day.  Use MiraLax  (over the counter) for constipation as needed.   Driving restrictions   Complete by: As directed    No driving while taking narcotic pain meds.   Increase activity slowly as tolerated   Complete by: As directed    Post-operative opioid taper instructions:   Complete by: As directed    POST-OPERATIVE OPIOID TAPER INSTRUCTIONS: It is important to wean off of your opioid medication as soon as possible. If you do not need pain medication after your surgery it is ok to stop day one. Opioids include: Codeine, Hydrocodone (Norco, Vicodin), Oxycodone (Percocet, oxycontin ) and hydromorphone  amongst others.  Long term and even short term use of opiods can cause: Increased pain response Dependence Constipation Depression Respiratory depression And more.  Withdrawal symptoms can include Flu like symptoms Nausea, vomiting And more Techniques to manage these symptoms Hydrate well Eat regular healthy meals Stay active Use relaxation techniques(deep breathing, meditating, yoga) Do Not substitute Alcohol  to help with tapering If you have been on opioids for less than two weeks and do not have pain than it is ok to stop all together.  Plan to wean off of opioids This plan should start within one week post op of your joint replacement. Maintain the same interval or time between taking each dose and first decrease the dose.  Cut the total daily intake of opioids by one tablet each day Next start to increase the time between doses. The last dose that should be  eliminated is the evening dose.             Signed: Ozell Cummins 11/26/2023, 7:32 AM

## 2023-11-26 NOTE — Care Management Obs Status (Signed)
 MEDICARE OBSERVATION STATUS NOTIFICATION   Patient Details  Name: AXEL FRISK MRN: [id number] Date of Birth: 01-05-1946   Medicare Observation Status Notification Given:  Yes    Alfonse JONELLE Rex, RN 11/26/2023, 9:49 AM

## 2023-11-26 NOTE — TOC Transition Note (Signed)
 Transition of Care [facility]) - Discharge Note   Patient Details  Name: Manuel Landry MRN: [id number] Date of Birth: 1945-10-02  Transition of Care [facility]) CM/SW Contact:  Alfonse JONELLE Rex, RN Phone Number: 11/26/2023, 10:02 AM   Clinical Narrative:  Met with patient at bedside to review dc therapy and home equipment needs, pt confirmed HH PT ([facility]), RW provided at bedside by [employer]. MOON completed. No TOC needs.      Final next level of care: Home w Home Health Services     Patient Goals and CMS Choice Patient states their goals for this hospitalization and ongoing recovery are:: return home          Discharge Placement                       Discharge Plan and Services Additional resources added to the After Visit Summary for                                       Social Drivers of Health (SDOH) Interventions SDOH Screenings   Food Insecurity: No Food Insecurity (11/25/2023)  Housing: Low Risk  (11/25/2023)  Transportation Needs: No Transportation Needs (11/25/2023)  Utilities: Not At Risk (11/25/2023)  Alcohol  Screen: Low Risk  (06/29/2023)  Depression (PHQ2-9): Low Risk  (06/30/2023)  Financial Resource Strain: Low Risk  (06/29/2023)  Physical Activity: Insufficiently Active (06/29/2023)  Social Connections: Moderately Integrated (11/25/2023)  Stress: No Stress Concern Present (06/29/2023)  Tobacco Use: Low Risk  (11/25/2023)  Health Literacy: Adequate Health Literacy (01/21/2023)     Readmission Risk Interventions     No data to display

## 2023-11-26 NOTE — Progress Notes (Signed)
   Subjective:  Patient reports pain as mild.    Objective:   VITALS:   Vitals:   11/25/23 1747 11/25/23 2107 11/26/23 0108 11/26/23 0549  BP: (!) 140/82 127/79 125/70 (!) 148/82  Pulse: 87 81 76 85  Resp: 17 17 16 15   Temp: 98.6 F (37 C) 98.2 F (36.8 C) 98.2 F (36.8 C) 98.6 F (37 C)  TempSrc: Oral Oral Oral Oral  SpO2: 96% 93% 95% 96%  Weight:      Height:        Neurovascular intact Sensation intact distally Intact pulses distally Dorsiflexion/Plantar flexion intact Incision: dressing C/D/I and no drainage   Lab Results  Component Value Date   WBC 5.4 11/17/2023   HGB 15.0 11/17/2023   HCT 44.4 11/17/2023   MCV 89.9 11/17/2023   PLT 221 11/17/2023     Assessment/Plan:  1 Day Post-Op   - Expected postop acute blood loss anemia - Up with PT/OT - DVT ppx - SCDs, ambulation, aspirin  - WBAT operative extremity - has urinated - d/c home once he clears PT  Manuel Landry 11/26/2023, 7:31 AM

## 2023-11-26 NOTE — Progress Notes (Signed)
 Physical Therapy Treatment Patient Details Name: Manuel Landry MRN: [id number] DOB: 11/12/1945 Today's Date: 11/26/2023   History of Present Illness Pt s/p L THR and with hx of prostate CA    PT Comments  Pt motivated and progressing well with mobility.  Pt up to ambulate increased distance in hall with noted improvement in stability, requiring decreased assist level for transfers and reviewed car transfers.  Pt eager for dc home this date.    If plan is discharge home, recommend the following: A little help with walking and/or transfers;A little help with bathing/dressing/bathroom;Assistance with cooking/housework;Assist for transportation;Help with stairs or ramp for entrance   Can travel by private vehicle        Equipment Recommendations  Rolling walker (2 wheels)    Recommendations for Other Services       Precautions / Restrictions Precautions Precautions: Fall Recall of Precautions/Restrictions: Intact Restrictions Weight Bearing Restrictions Per Provider Order: Yes LLE Weight Bearing Per Provider Order: Weight bearing as tolerated     Mobility  Bed Mobility Overal bed mobility: Needs Assistance Bed Mobility: Supine to Sit     Supine to sit: Contact guard     General bed mobility comments: Pt up in chair and returns to same    Transfers Overall transfer level: Needs assistance Equipment used: Rolling walker (2 wheels) Transfers: Sit to/from Stand Sit to Stand: Contact guard assist, Supervision           General transfer comment: min cues for LE management and use of UEs to self assist    Ambulation/Gait Ambulation/Gait assistance: Contact guard assist Gait Distance (Feet): 160 Feet Assistive device: Rolling walker (2 wheels) Gait Pattern/deviations: Decreased step length - right, Decreased step length - left, Shuffle, Trunk flexed, Step-to pattern, Step-through pattern       General Gait Details: cues for posture, position from RW and intial  sequence   Stairs             Wheelchair Mobility     Tilt Bed    Modified Rankin (Stroke Patients Only)       Balance Overall balance assessment: Needs assistance Sitting-balance support: No upper extremity supported, Feet supported Sitting balance-Leahy Scale: Good     Standing balance support: Single extremity supported Standing balance-Leahy Scale: Poor                              Communication Communication Communication: Impaired Factors Affecting Communication: Hearing impaired  Cognition Arousal: Alert Behavior During Therapy: WFL for tasks assessed/performed   PT - Cognitive impairments: No apparent impairments                         Following commands: Intact      Cueing Cueing Techniques: Verbal cues, Gestural cues  Exercises Total Joint Exercises Ankle Circles/Pumps: AROM, Both, 15 reps, Supine Quad Sets: AROM, Both, 10 reps, Supine Heel Slides: AAROM, Left, 15 reps, Supine Hip ABduction/ADduction: AAROM, Left, 15 reps, Supine    General Comments        Pertinent Vitals/Pain Pain Assessment Pain Assessment: 0-10 Pain Score: 5  Pain Location: L hip Pain Descriptors / Indicators: Aching, Sore Pain Intervention(s): Limited activity within patient's tolerance, Monitored during session, Premedicated before session, Ice applied    Home Living Family/patient expects to be discharged to:: Private residence Living Arrangements: Spouse/significant other Available Help at Discharge: Family;Available 24 hours/day Type of Home: House Home Access:  Level entry       Home Layout: One level Home Equipment: Cane - single point      Prior Function            PT Goals (current goals can now be found in the care plan section) Acute Rehab PT Goals Patient Stated Goal: Regain IND PT Goal Formulation: With patient Time For Goal Achievement: 12/03/23 Potential to Achieve Goals: Good Progress towards PT goals:  Progressing toward goals    Frequency    7X/week      PT Plan      Co-evaluation              AM-PAC PT 6 Clicks Mobility   Outcome Measure  Help needed turning from your back to your side while in a flat bed without using bedrails?: A Little Help needed moving from lying on your back to sitting on the side of a flat bed without using bedrails?: A Little Help needed moving to and from a bed to a chair (including a wheelchair)?: A Little Help needed standing up from a chair using your arms (e.g., wheelchair or bedside chair)?: A Little Help needed to walk in hospital room?: A Little Help needed climbing 3-5 steps with a railing? : A Little 6 Click Score: 18    End of Session Equipment Utilized During Treatment: Gait belt Activity Tolerance: Patient tolerated treatment well Patient left: in chair;with call bell/phone within reach;with chair alarm set Nurse Communication: Mobility status PT Visit Diagnosis: Difficulty in walking, not elsewhere classified (R26.2)     Time: 1100-1120 PT Time Calculation (min) (ACUTE ONLY): 20 min  Charges:    $Gait Training: 8-22 mins $Therapeutic Exercise: 8-22 mins PT General Charges $$ ACUTE PT VISIT: 1 Visit                     [facility] PT Acute Rehabilitation Services Office [phone]    Eshaal Duby 11/26/2023, 12:53 PM

## 2023-11-26 NOTE — Plan of Care (Signed)
 Problem: Education: Goal: Knowledge of General Education information will improve Description: Including pain rating scale, medication(s)/side effects and non-pharmacologic comfort measures 11/26/2023 1115 by Alaina Dozier PARAS, RN Outcome: Adequate for Discharge 11/26/2023 1115 by Alaina Dozier PARAS, RN Outcome: Progressing 11/26/2023 0737 by Alaina Dozier PARAS, RN Outcome: Progressing   Problem: Health Behavior/Discharge Planning: Goal: Ability to manage health-related needs will improve 11/26/2023 1115 by Alaina Dozier PARAS, RN Outcome: Adequate for Discharge 11/26/2023 1115 by Alaina Dozier PARAS, RN Outcome: Progressing 11/26/2023 0737 by Alaina Dozier PARAS, RN Outcome: Progressing   Problem: Clinical Measurements: Goal: Ability to maintain clinical measurements within normal limits will improve 11/26/2023 1115 by Alaina Dozier PARAS, RN Outcome: Adequate for Discharge 11/26/2023 1115 by Alaina Dozier PARAS, RN Outcome: Progressing 11/26/2023 0737 by Alaina Dozier PARAS, RN Outcome: Progressing Goal: Will remain free from infection 11/26/2023 1115 by Alaina Dozier PARAS, RN Outcome: Adequate for Discharge 11/26/2023 1115 by Alaina Dozier PARAS, RN Outcome: Progressing 11/26/2023 0737 by Alaina Dozier PARAS, RN Outcome: Progressing Goal: Diagnostic test results will improve 11/26/2023 1115 by Alaina Dozier PARAS, RN Outcome: Adequate for Discharge 11/26/2023 1115 by Alaina Dozier PARAS, RN Outcome: Progressing 11/26/2023 0737 by Alaina Dozier PARAS, RN Outcome: Progressing Goal: Respiratory complications will improve 11/26/2023 1115 by Alaina Dozier PARAS, RN Outcome: Adequate for Discharge 11/26/2023 1115 by Alaina Dozier PARAS, RN Outcome: Progressing Goal: Cardiovascular complication will be avoided 11/26/2023 1115 by Alaina Dozier PARAS, RN Outcome: Adequate for Discharge 11/26/2023 1115 by Alaina Dozier PARAS, RN Outcome:  Progressing   Problem: Activity: Goal: Risk for activity intolerance will decrease 11/26/2023 1115 by Alaina Dozier PARAS, RN Outcome: Adequate for Discharge 11/26/2023 1115 by Alaina Dozier PARAS, RN Outcome: Progressing   Problem: Nutrition: Goal: Adequate nutrition will be maintained 11/26/2023 1115 by Alaina Dozier PARAS, RN Outcome: Adequate for Discharge 11/26/2023 1115 by Alaina Dozier PARAS, RN Outcome: Progressing   Problem: Coping: Goal: Level of anxiety will decrease 11/26/2023 1115 by Alaina Dozier PARAS, RN Outcome: Adequate for Discharge 11/26/2023 1115 by Alaina Dozier PARAS, RN Outcome: Progressing   Problem: Elimination: Goal: Will not experience complications related to bowel motility 11/26/2023 1115 by Alaina Dozier PARAS, RN Outcome: Adequate for Discharge 11/26/2023 1115 by Alaina Dozier PARAS, RN Outcome: Progressing Goal: Will not experience complications related to urinary retention 11/26/2023 1115 by Alaina Dozier PARAS, RN Outcome: Adequate for Discharge 11/26/2023 1115 by Alaina Dozier PARAS, RN Outcome: Progressing   Problem: Pain Managment: Goal: General experience of comfort will improve and/or be controlled 11/26/2023 1115 by Alaina Dozier PARAS, RN Outcome: Adequate for Discharge 11/26/2023 1115 by Alaina Dozier PARAS, RN Outcome: Progressing   Problem: Safety: Goal: Ability to remain free from injury will improve 11/26/2023 1115 by Alaina Dozier PARAS, RN Outcome: Adequate for Discharge 11/26/2023 1115 by Alaina Dozier PARAS, RN Outcome: Progressing   Problem: Skin Integrity: Goal: Risk for impaired skin integrity will decrease 11/26/2023 1115 by Alaina Dozier PARAS, RN Outcome: Adequate for Discharge 11/26/2023 1115 by Alaina Dozier PARAS, RN Outcome: Progressing   Problem: Education: Goal: Knowledge of the prescribed therapeutic regimen will improve 11/26/2023 1115 by Alaina Dozier PARAS, RN Outcome:  Adequate for Discharge 11/26/2023 1115 by Alaina Dozier PARAS, RN Outcome: Progressing Goal: Understanding of discharge needs will improve 11/26/2023 1115 by Alaina Dozier PARAS, RN Outcome: Adequate for Discharge 11/26/2023 1115 by Alaina Dozier PARAS, RN Outcome: Progressing Goal: Individualized Educational Video(s) 11/26/2023 1115 by Alaina Dozier PARAS, RN Outcome: Adequate for Discharge 11/26/2023 1115 by Alaina Dozier PARAS, RN Outcome: Progressing  Problem: Activity: Goal: Ability to avoid complications of mobility impairment will improve 11/26/2023 1115 by Alaina Dozier PARAS, RN Outcome: Adequate for Discharge 11/26/2023 1115 by Alaina Dozier PARAS, RN Outcome: Progressing Goal: Ability to tolerate increased activity will improve 11/26/2023 1115 by Alaina Dozier PARAS, RN Outcome: Adequate for Discharge 11/26/2023 1115 by Alaina Dozier PARAS, RN Outcome: Progressing   Problem: Clinical Measurements: Goal: Postoperative complications will be avoided or minimized 11/26/2023 1115 by Alaina Dozier PARAS, RN Outcome: Adequate for Discharge 11/26/2023 1115 by Alaina Dozier PARAS, RN Outcome: Progressing   Problem: Pain Management: Goal: Pain level will decrease with appropriate interventions 11/26/2023 1115 by Alaina Dozier PARAS, RN Outcome: Adequate for Discharge 11/26/2023 1115 by Alaina Dozier PARAS, RN Outcome: Progressing   Problem: Skin Integrity: Goal: Will show signs of wound healing 11/26/2023 1115 by Alaina Dozier PARAS, RN Outcome: Adequate for Discharge 11/26/2023 1115 by Alaina Dozier PARAS, RN Outcome: Progressing   Problem: Education: Goal: Knowledge of General Education information will improve Description: Including pain rating scale, medication(s)/side effects and non-pharmacologic comfort measures 11/26/2023 1115 by Alaina Dozier PARAS, RN Outcome: Adequate for Discharge 11/26/2023 1115 by Alaina Dozier PARAS, RN Outcome:  Progressing   Problem: Health Behavior/Discharge Planning: Goal: Ability to manage health-related needs will improve 11/26/2023 1115 by Alaina Dozier PARAS, RN Outcome: Adequate for Discharge 11/26/2023 1115 by Alaina Dozier PARAS, RN Outcome: Progressing   Problem: Clinical Measurements: Goal: Ability to maintain clinical measurements within normal limits will improve 11/26/2023 1115 by Alaina Dozier PARAS, RN Outcome: Adequate for Discharge 11/26/2023 1115 by Alaina Dozier PARAS, RN Outcome: Progressing Goal: Will remain free from infection 11/26/2023 1115 by Alaina Dozier PARAS, RN Outcome: Adequate for Discharge 11/26/2023 1115 by Alaina Dozier PARAS, RN Outcome: Progressing Goal: Diagnostic test results will improve 11/26/2023 1115 by Alaina Dozier PARAS, RN Outcome: Adequate for Discharge 11/26/2023 1115 by Alaina Dozier PARAS, RN Outcome: Progressing Goal: Respiratory complications will improve 11/26/2023 1115 by Alaina Dozier PARAS, RN Outcome: Adequate for Discharge 11/26/2023 1115 by Alaina Dozier PARAS, RN Outcome: Progressing Goal: Cardiovascular complication will be avoided 11/26/2023 1115 by Alaina Dozier PARAS, RN Outcome: Adequate for Discharge 11/26/2023 1115 by Alaina Dozier PARAS, RN Outcome: Progressing   Problem: Activity: Goal: Risk for activity intolerance will decrease 11/26/2023 1115 by Alaina Dozier PARAS, RN Outcome: Adequate for Discharge 11/26/2023 1115 by Alaina Dozier PARAS, RN Outcome: Progressing   Problem: Nutrition: Goal: Adequate nutrition will be maintained 11/26/2023 1115 by Alaina Dozier PARAS, RN Outcome: Adequate for Discharge 11/26/2023 1115 by Alaina Dozier PARAS, RN Outcome: Progressing   Problem: Coping: Goal: Level of anxiety will decrease 11/26/2023 1115 by Alaina Dozier PARAS, RN Outcome: Adequate for Discharge 11/26/2023 1115 by Alaina Dozier PARAS, RN Outcome: Progressing   Problem:  Elimination: Goal: Will not experience complications related to bowel motility 11/26/2023 1115 by Alaina Dozier PARAS, RN Outcome: Adequate for Discharge 11/26/2023 1115 by Alaina Dozier PARAS, RN Outcome: Progressing Goal: Will not experience complications related to urinary retention 11/26/2023 1115 by Alaina Dozier PARAS, RN Outcome: Adequate for Discharge 11/26/2023 1115 by Alaina Dozier PARAS, RN Outcome: Progressing   Problem: Pain Managment: Goal: General experience of comfort will improve and/or be controlled 11/26/2023 1115 by Alaina Dozier PARAS, RN Outcome: Adequate for Discharge 11/26/2023 1115 by Alaina Dozier PARAS, RN Outcome: Progressing   Problem: Safety: Goal: Ability to remain free from injury will improve 11/26/2023 1115 by Alaina Dozier PARAS, RN Outcome: Adequate for Discharge 11/26/2023 1115 by Alaina Dozier PARAS, RN Outcome: Progressing   Problem:  Skin Integrity: Goal: Risk for impaired skin integrity will decrease 11/26/2023 1115 by Alaina Dozier PARAS, RN Outcome: Adequate for Discharge 11/26/2023 1115 by Alaina Dozier PARAS, RN Outcome: Progressing

## 2023-11-26 NOTE — Plan of Care (Signed)

## 2023-11-26 NOTE — Progress Notes (Signed)
 IV removed, dressed, belongings packed, instructions reviewed with understanding verbalized, transferred to front entrance via wheelchair, walker delivered.

## 2023-11-26 NOTE — Evaluation (Signed)
 Physical Therapy Evaluation Patient Details Name: Manuel Landry MRN: [id number] DOB: Apr 04, 1946 Today's Date: 11/26/2023  History of Present Illness  Pt s/p L THR and with hx of prostate CA  Clinical Impression  Pt s/p L THR and presents with decreased L LE strength/ROM and post op pain limiting functional mobility.  Pt should progress to dc home with family assist and reports follow up HHPT is scheduled.        If plan is discharge home, recommend the following: A little help with walking and/or transfers;A little help with bathing/dressing/bathroom;Assistance with cooking/housework;Assist for transportation;Help with stairs or ramp for entrance   Can travel by private vehicle        Equipment Recommendations Rolling walker (2 wheels)  Recommendations for Other Services       Functional Status Assessment Patient has had a recent decline in their functional status and demonstrates the ability to make significant improvements in function in a reasonable and predictable amount of time.     Precautions / Restrictions Precautions Precautions: Fall Recall of Precautions/Restrictions: Intact Restrictions Weight Bearing Restrictions Per Provider Order: Yes LLE Weight Bearing Per Provider Order: Weight bearing as tolerated      Mobility  Bed Mobility Overal bed mobility: Needs Assistance Bed Mobility: Supine to Sit     Supine to sit: Contact guard     General bed mobility comments: cues for sequence and use of R LE to self assist; CGA for safety only    Transfers Overall transfer level: Needs assistance Equipment used: Rolling walker (2 wheels) Transfers: Sit to/from Stand Sit to Stand: Min assist, Contact guard assist           General transfer comment: Steady assist with cues for LE management and use of UEs to self assist    Ambulation/Gait Ambulation/Gait assistance: Contact guard assist Gait Distance (Feet): 120 Feet Assistive device: Rolling walker (2  wheels) Gait Pattern/deviations: Step-to pattern, Decreased step length - right, Decreased step length - left, Shuffle, Trunk flexed       General Gait Details: cues for posture, position from RW and intial sequence  Stairs            Wheelchair Mobility     Tilt Bed    Modified Rankin (Stroke Patients Only)       Balance Overall balance assessment: Needs assistance Sitting-balance support: No upper extremity supported, Feet supported Sitting balance-Leahy Scale: Good     Standing balance support: Single extremity supported Standing balance-Leahy Scale: Poor                               Pertinent Vitals/Pain Pain Assessment Pain Assessment: 0-10 Pain Score: 5  Pain Location: L hip Pain Descriptors / Indicators: Aching, Sore Pain Intervention(s): Limited activity within patient's tolerance, Monitored during session, Premedicated before session, Ice applied    Home Living Family/patient expects to be discharged to:: Private residence Living Arrangements: Spouse/significant other Available Help at Discharge: Family;Available 24 hours/day Type of Home: House Home Access: Level entry       Home Layout: One level Home Equipment: Cane - single point      Prior Function Prior Level of Function : Independent/Modified Independent                     Extremity/Trunk Assessment   Upper Extremity Assessment Upper Extremity Assessment: Overall WFL for tasks assessed    Lower Extremity Assessment Lower Extremity  Assessment: LLE deficits/detail LLE Deficits / Details: AAROM at hip to 90 flex and 20 abd; 2/5 strength at hip    Cervical / Trunk Assessment Cervical / Trunk Assessment: Normal  Communication   Communication Communication: Impaired Factors Affecting Communication: Hearing impaired    Cognition Arousal: Alert Behavior During Therapy: WFL for tasks assessed/performed   PT - Cognitive impairments: No apparent impairments                          Following commands: Intact       Cueing Cueing Techniques: Verbal cues, Gestural cues     General Comments      Exercises Total Joint Exercises Ankle Circles/Pumps: AROM, Both, 15 reps, Supine Quad Sets: AROM, Both, 10 reps, Supine Heel Slides: AAROM, Left, 15 reps, Supine Hip ABduction/ADduction: AAROM, Left, 15 reps, Supine   Assessment/Plan    PT Assessment Patient needs continued PT services  PT Problem List Decreased strength;Decreased range of motion;Decreased activity tolerance;Decreased balance;Decreased mobility;Decreased knowledge of use of DME;Pain       PT Treatment Interventions DME instruction;Gait training;Functional mobility training;Therapeutic activities;Therapeutic exercise;Patient/family education    PT Goals (Current goals can be found in the Care Plan section)  Acute Rehab PT Goals Patient Stated Goal: Regain IND PT Goal Formulation: With patient Time For Goal Achievement: 12/03/23 Potential to Achieve Goals: Good    Frequency 7X/week     Co-evaluation               AM-PAC PT 6 Clicks Mobility  Outcome Measure Help needed turning from your back to your side while in a flat bed without using bedrails?: A Little Help needed moving from lying on your back to sitting on the side of a flat bed without using bedrails?: A Little Help needed moving to and from a bed to a chair (including a wheelchair)?: A Little Help needed standing up from a chair using your arms (e.g., wheelchair or bedside chair)?: A Little Help needed to walk in hospital room?: A Little Help needed climbing 3-5 steps with a railing? : A Little 6 Click Score: 18    End of Session Equipment Utilized During Treatment: Gait belt Activity Tolerance: Patient tolerated treatment well Patient left: in chair;with call bell/phone within reach;with chair alarm set Nurse Communication: Mobility status PT Visit Diagnosis: Difficulty in walking, not  elsewhere classified (R26.2)    Time: 9194-9151 PT Time Calculation (min) (ACUTE ONLY): 43 min   Charges:   PT Evaluation $PT Eval Low Complexity: 1 Low PT Treatments $Gait Training: 8-22 mins $Therapeutic Exercise: 8-22 mins PT General Charges $$ ACUTE PT VISIT: 1 Visit         [facility] PT Acute Rehabilitation Services Office [phone]   Maycie Luera 11/26/2023, 12:48 PM

## 2023-11-27 DIAGNOSIS — Z556 Problems related to health literacy: Secondary | ICD-10-CM | POA: Diagnosis not present

## 2023-11-27 DIAGNOSIS — Z7982 Long term (current) use of aspirin: Secondary | ICD-10-CM | POA: Diagnosis not present

## 2023-11-27 DIAGNOSIS — E785 Hyperlipidemia, unspecified: Secondary | ICD-10-CM | POA: Diagnosis not present

## 2023-11-27 DIAGNOSIS — D649 Anemia, unspecified: Secondary | ICD-10-CM | POA: Diagnosis not present

## 2023-11-27 DIAGNOSIS — Z79899 Other long term (current) drug therapy: Secondary | ICD-10-CM | POA: Diagnosis not present

## 2023-11-27 DIAGNOSIS — Z860101 Personal history of adenomatous and serrated colon polyps: Secondary | ICD-10-CM | POA: Diagnosis not present

## 2023-11-27 DIAGNOSIS — N400 Enlarged prostate without lower urinary tract symptoms: Secondary | ICD-10-CM | POA: Diagnosis not present

## 2023-11-27 DIAGNOSIS — Z8546 Personal history of malignant neoplasm of prostate: Secondary | ICD-10-CM | POA: Diagnosis not present

## 2023-11-27 DIAGNOSIS — K581 Irritable bowel syndrome with constipation: Secondary | ICD-10-CM | POA: Diagnosis not present

## 2023-11-27 DIAGNOSIS — F419 Anxiety disorder, unspecified: Secondary | ICD-10-CM | POA: Diagnosis not present

## 2023-11-27 DIAGNOSIS — Z471 Aftercare following joint replacement surgery: Secondary | ICD-10-CM | POA: Diagnosis not present

## 2023-11-27 DIAGNOSIS — Z96642 Presence of left artificial hip joint: Secondary | ICD-10-CM | POA: Diagnosis not present

## 2023-11-27 DIAGNOSIS — K219 Gastro-esophageal reflux disease without esophagitis: Secondary | ICD-10-CM | POA: Diagnosis not present

## 2023-11-27 DIAGNOSIS — K4090 Unilateral inguinal hernia, without obstruction or gangrene, not specified as recurrent: Secondary | ICD-10-CM | POA: Diagnosis not present

## 2023-11-27 DIAGNOSIS — I10 Essential (primary) hypertension: Secondary | ICD-10-CM | POA: Diagnosis not present

## 2023-11-29 ENCOUNTER — Other Ambulatory Visit (HOSPITAL_BASED_OUTPATIENT_CLINIC_OR_DEPARTMENT_OTHER): Payer: Self-pay

## 2023-11-30 DIAGNOSIS — E785 Hyperlipidemia, unspecified: Secondary | ICD-10-CM | POA: Diagnosis not present

## 2023-11-30 DIAGNOSIS — F419 Anxiety disorder, unspecified: Secondary | ICD-10-CM | POA: Diagnosis not present

## 2023-11-30 DIAGNOSIS — N400 Enlarged prostate without lower urinary tract symptoms: Secondary | ICD-10-CM | POA: Diagnosis not present

## 2023-11-30 DIAGNOSIS — I10 Essential (primary) hypertension: Secondary | ICD-10-CM | POA: Diagnosis not present

## 2023-11-30 DIAGNOSIS — Z471 Aftercare following joint replacement surgery: Secondary | ICD-10-CM | POA: Diagnosis not present

## 2023-11-30 DIAGNOSIS — D649 Anemia, unspecified: Secondary | ICD-10-CM | POA: Diagnosis not present

## 2023-12-02 DIAGNOSIS — E785 Hyperlipidemia, unspecified: Secondary | ICD-10-CM | POA: Diagnosis not present

## 2023-12-02 DIAGNOSIS — D649 Anemia, unspecified: Secondary | ICD-10-CM | POA: Diagnosis not present

## 2023-12-02 DIAGNOSIS — F419 Anxiety disorder, unspecified: Secondary | ICD-10-CM | POA: Diagnosis not present

## 2023-12-02 DIAGNOSIS — N400 Enlarged prostate without lower urinary tract symptoms: Secondary | ICD-10-CM | POA: Diagnosis not present

## 2023-12-02 DIAGNOSIS — Z471 Aftercare following joint replacement surgery: Secondary | ICD-10-CM | POA: Diagnosis not present

## 2023-12-02 DIAGNOSIS — I10 Essential (primary) hypertension: Secondary | ICD-10-CM | POA: Diagnosis not present

## 2023-12-04 DIAGNOSIS — N400 Enlarged prostate without lower urinary tract symptoms: Secondary | ICD-10-CM | POA: Diagnosis not present

## 2023-12-04 DIAGNOSIS — Z471 Aftercare following joint replacement surgery: Secondary | ICD-10-CM | POA: Diagnosis not present

## 2023-12-04 DIAGNOSIS — E785 Hyperlipidemia, unspecified: Secondary | ICD-10-CM | POA: Diagnosis not present

## 2023-12-04 DIAGNOSIS — F419 Anxiety disorder, unspecified: Secondary | ICD-10-CM | POA: Diagnosis not present

## 2023-12-04 DIAGNOSIS — I10 Essential (primary) hypertension: Secondary | ICD-10-CM | POA: Diagnosis not present

## 2023-12-04 DIAGNOSIS — D649 Anemia, unspecified: Secondary | ICD-10-CM | POA: Diagnosis not present

## 2023-12-06 DIAGNOSIS — I10 Essential (primary) hypertension: Secondary | ICD-10-CM | POA: Diagnosis not present

## 2023-12-06 DIAGNOSIS — D649 Anemia, unspecified: Secondary | ICD-10-CM | POA: Diagnosis not present

## 2023-12-06 DIAGNOSIS — M1612 Unilateral primary osteoarthritis, left hip: Secondary | ICD-10-CM

## 2023-12-06 DIAGNOSIS — E785 Hyperlipidemia, unspecified: Secondary | ICD-10-CM | POA: Diagnosis not present

## 2023-12-06 DIAGNOSIS — N400 Enlarged prostate without lower urinary tract symptoms: Secondary | ICD-10-CM | POA: Diagnosis not present

## 2023-12-06 DIAGNOSIS — F419 Anxiety disorder, unspecified: Secondary | ICD-10-CM | POA: Diagnosis not present

## 2023-12-06 DIAGNOSIS — Z471 Aftercare following joint replacement surgery: Secondary | ICD-10-CM | POA: Diagnosis not present

## 2023-12-08 DIAGNOSIS — Z471 Aftercare following joint replacement surgery: Secondary | ICD-10-CM | POA: Diagnosis not present

## 2023-12-08 DIAGNOSIS — F419 Anxiety disorder, unspecified: Secondary | ICD-10-CM | POA: Diagnosis not present

## 2023-12-08 DIAGNOSIS — E785 Hyperlipidemia, unspecified: Secondary | ICD-10-CM | POA: Diagnosis not present

## 2023-12-08 DIAGNOSIS — N400 Enlarged prostate without lower urinary tract symptoms: Secondary | ICD-10-CM | POA: Diagnosis not present

## 2023-12-08 DIAGNOSIS — I10 Essential (primary) hypertension: Secondary | ICD-10-CM | POA: Diagnosis not present

## 2023-12-08 DIAGNOSIS — D649 Anemia, unspecified: Secondary | ICD-10-CM | POA: Diagnosis not present

## 2023-12-10 ENCOUNTER — Ambulatory Visit: Admitting: Physician Assistant

## 2023-12-10 DIAGNOSIS — Z96642 Presence of left artificial hip joint: Secondary | ICD-10-CM

## 2023-12-10 NOTE — Progress Notes (Signed)
 Post-Op Visit Note   Patient: Manuel Landry           Date of Birth: 20-Nov-1945           MRN: [id number] Visit Date: 12/10/2023 PCP: Frann Mabel Mt, DO   Assessment & Plan:  Chief Complaint:  Chief Complaint  Patient presents with   Left Hip - Routine Post Op    12/06/23 LT THA   Visit Diagnoses:  1. Status post total replacement of left hip     Plan: Patient is a pleasant 78 year old gentleman who comes in today 2 weeks status post left total hip replacement 11/25/2023.  He has been doing very well.  He notes occasional cramping in his thigh but otherwise no complaints.  He has been up walking around and is finished home health PT.  Primarily walking unassisted but does have a cane available at home if needed.  He has been compliant taking baby aspirin  twice daily for DVT prophylaxis.  Examination of the left hip reveals a well-healing surgical incision with nylon sutures in place.  No evidence of infection or cellulitis.  Calves are soft nontender.  He is neurovascularly intact distally.  Today, sutures were removed and Steri-Strips applied.  He will continue taking baby aspirin  twice daily for DVT prophylaxis for another 4 weeks.  Postoperative instructions provided.  Follow-up in 4 weeks for repeat evaluation and AP pelvis x-rays.  Call with concerns or questions.  Follow-Up Instructions: Return in about 4 weeks (around 01/07/2024).   Orders:  No orders of the defined types were placed in this encounter.  No orders of the defined types were placed in this encounter.   Imaging: No new imaging  PMFS History: Patient Active Problem List   Diagnosis Date Noted   Status post total replacement of left hip 11/25/2023   Primary osteoarthritis of left hip 11/24/2023   Elevated blood pressure reading 06/11/2022   Seasonal allergies 03/03/2022   Chronic rhinitis 03/06/2021   Nocturnal muscle cramp 09/02/2020   GAD (generalized anxiety disorder) 09/02/2020   Closed  nondisplaced fracture of body of left scapula 04/15/2020   Situational anxiety 12/27/2018   Trigger finger of left thumb 11/17/2018   Hyperlipidemia 01/27/2017   Cardiovascular risk factor 08/12/2011   S/P cholecystectomy 06/22/2011   History of colonic polyps 06/22/2011   FLATULENCE-GAS-BLOATING 02/13/2010   Calculus of gallbladder 02/11/2010   GERD 02/10/2010   Diverticulosis of colon 02/10/2010   Nausea 02/10/2010   ABDOMINAL PAIN RIGHT UPPER QUADRANT 02/10/2010   ABDOMINAL PAIN, EPIGASTRIC 02/10/2010   ANEMIA-IRON DEFICIENCY 07/17/2008   Inguinal hernia 07/17/2008   IRRITABLE BOWEL SYNDROME 07/17/2008   Gastritis and gastroduodenitis 07/11/2008   Duodenitis 07/11/2008   RENAL CYST, RIGHT 07/11/2008   DYSPEPSIA 06/12/2008   Constipation 05/31/2008   Backache 05/31/2008   BENIGN PROSTATIC HYPERTROPHY, MILD, HX OF 05/31/2008   Past Medical History:  Diagnosis Date   Allergy    Anxiety    Arthritis    Blind loop syndrome    Bulging discs    LOWER BACK   Cataract    beginnings of   Diverticulosis    Family history of malignant neoplasm of gastrointestinal tract    History of adenomatous polyp of colon    History of Barrett's esophagus    History of chronic gastritis    Hyperlipidemia    no meds taken   IBS (irritable bowel syndrome)    Internal hemorrhoids    Malignant neoplasm of prostate [facility]) urologist-  dr dahlstedt/  oncologist-  dr patrcia   dx 08-09-2017-- Stage T2a, Gleason 4+5, PSA 3.22 (on finateride)--- treatment ADT and external beam radiation   PONV (postoperative nausea and vomiting)    Seasonal allergic rhinitis    Tubular adenoma of colon    Wears glasses     Family History  Problem Relation Age of Onset   Colon cancer Mother 45   Cancer Mother    Diabetes Maternal Grandmother    COPD Sister    Heart failure Father 49   Esophageal cancer Neg Hx    Rectal cancer Neg Hx    Stomach cancer Neg Hx     Past Surgical History:  Procedure  Laterality Date   bone spur Right    right foot   CATARACT EXTRACTION Bilateral    COLONOSCOPY  last one 10-21-2016  dr albertus   CYSTOSCOPY WITH INSERTION OF UROLIFT N/A 12/15/2017   Procedure: CYSTOSCOPY WITH INSERTION OF UROLIFT, BIOPSY  PROSTATIC URETHAL;  Surgeon: Matilda Senior, MD;  Location: [facility];  Service: Urology;  Laterality: N/A;   ESOPHAGOGASTRODUODENOSCOPY (EGD) WITH PROPOFOL   last one 09-26-2013   dr pyrtle   INGUINAL HERNIA REPAIR Bilateral right 10-25-1997:  left 09-28-2008 (dr m. gladis @ [facility])   LAPAROSCOPIC CHOLECYSTECTOMY  2012   MANDIBLE SURGERY Bilateral 1990s   removal calcified bone growths   ROTATOR CUFF REPAIR Right 03-26-2009   dr duwayne  [facility]   SHOULDER OPEN ROTATOR CUFF REPAIR  03/17/2012   Procedure: ROTATOR CUFF REPAIR SHOULDER OPEN;  Surgeon: Reyes JAYSON duwayne, MD;  Location: [facility];  Service: Orthopedics;  Laterality: Left;  LEFT MINI OPEN ROTATOR CUFF REPAIR    SPACE OAR INSTILLATION N/A 12/15/2017   Procedure: SPACE OAR INSTILLATION;  Surgeon: Matilda Senior, MD;  Location: [facility];  Service: Urology;  Laterality: N/A;   TONSILLECTOMY  child   TOTAL HIP ARTHROPLASTY Left 11/25/2023   Procedure: LEFT TOTAL HIP ARTHROPLASTY ANTERIOR APPROACH;  Surgeon: Jerri Kay HERO, MD;  Location: [facility];  Service: Orthopedics;  Laterality: Left;  3-C   UPPER GASTROINTESTINAL ENDOSCOPY     VASECTOMY     Social History   Occupational History   Occupation: Retired    Associate Professor: RETIRED  Tobacco Use   Smoking status: Never   Smokeless tobacco: Never   Tobacco comments:    Never smoke 11/13/21  Vaping Use   Vaping status: Never Used  Substance and Sexual Activity   Alcohol  use: Yes    Alcohol /week: 2.0 standard drinks of alcohol     Types: 2 Glasses of wine per week    Comment: occasional   Drug use: Never   Sexual activity: Not Currently    Birth control/protection: None    Comment: vasectomy 2011

## 2023-12-17 ENCOUNTER — Telehealth: Payer: Self-pay

## 2023-12-17 ENCOUNTER — Other Ambulatory Visit (HOSPITAL_BASED_OUTPATIENT_CLINIC_OR_DEPARTMENT_OTHER): Payer: Self-pay

## 2023-12-17 ENCOUNTER — Encounter: Payer: Self-pay | Admitting: Family Medicine

## 2023-12-17 ENCOUNTER — Other Ambulatory Visit: Payer: Self-pay

## 2023-12-17 NOTE — Telephone Encounter (Signed)
 Patient has some questions he would like to ask you about things since he has had his surgery on 11/25/23.  He would like for you to call him please at [phone]

## 2023-12-17 NOTE — Telephone Encounter (Signed)
 Spoke to patient

## 2023-12-17 NOTE — Telephone Encounter (Signed)
 Surgery done by Ortho and they already spoke with patient. Messages sent at the same time.  Copied from CRM [id number]. Topic: Clinical - Medical Advice >> Dec 17, 2023 11:50 AM [place] E wrote: Reason for CRM: Patient would like to speak with PCP's nurse in regards a few post-op questions that he has. Mobile number on file is good for a callback.

## 2023-12-20 ENCOUNTER — Other Ambulatory Visit (HOSPITAL_BASED_OUTPATIENT_CLINIC_OR_DEPARTMENT_OTHER): Payer: Self-pay

## 2023-12-20 ENCOUNTER — Other Ambulatory Visit: Payer: Self-pay

## 2023-12-21 ENCOUNTER — Encounter: Payer: Medicare Other | Admitting: Orthopaedic Surgery

## 2023-12-21 ENCOUNTER — Other Ambulatory Visit (HOSPITAL_BASED_OUTPATIENT_CLINIC_OR_DEPARTMENT_OTHER): Payer: Self-pay

## 2023-12-24 ENCOUNTER — Ambulatory Visit (INDEPENDENT_AMBULATORY_CARE_PROVIDER_SITE_OTHER): Admitting: Family Medicine

## 2023-12-24 ENCOUNTER — Encounter: Payer: Self-pay | Admitting: Family Medicine

## 2023-12-24 VITALS — BP 118/80 | HR 88 | Temp 98.0°F | Resp 16 | Ht 71.0 in | Wt 180.2 lb

## 2023-12-24 DIAGNOSIS — R319 Hematuria, unspecified: Secondary | ICD-10-CM | POA: Diagnosis not present

## 2023-12-24 DIAGNOSIS — R42 Dizziness and giddiness: Secondary | ICD-10-CM | POA: Diagnosis not present

## 2023-12-24 LAB — CBC
HCT: 36.9 % — ABNORMAL LOW (ref 39.0–52.0)
Hemoglobin: 12.6 g/dL — ABNORMAL LOW (ref 13.0–17.0)
MCHC: 34.1 g/dL (ref 30.0–36.0)
MCV: 87.1 fl (ref 78.0–100.0)
Platelets: 270 K/uL (ref 150.0–400.0)
RBC: 4.23 Mil/uL (ref 4.22–5.81)
RDW: 13 % (ref 11.5–15.5)
WBC: 5.3 K/uL (ref 4.0–10.5)

## 2023-12-24 LAB — URINALYSIS, MICROSCOPIC ONLY

## 2023-12-24 LAB — COMPREHENSIVE METABOLIC PANEL WITH GFR
ALT: 9 U/L (ref 0–53)
AST: 14 U/L (ref 0–37)
Albumin: 4 g/dL (ref 3.5–5.2)
Alkaline Phosphatase: 70 U/L (ref 39–117)
BUN: 12 mg/dL (ref 6–23)
CO2: 30 meq/L (ref 19–32)
Calcium: 8.9 mg/dL (ref 8.4–10.5)
Chloride: 97 meq/L (ref 96–112)
Creatinine, Ser: 0.78 mg/dL (ref 0.40–1.50)
GFR: 85.52 mL/min (ref >60.00)
Glucose, Bld: 82 mg/dL (ref 70–99)
Potassium: 5.4 meq/L — ABNORMAL HIGH (ref 3.5–5.1)
Sodium: 133 meq/L — ABNORMAL LOW (ref 135–145)
Total Bilirubin: 0.4 mg/dL (ref 0.2–1.2)
Total Protein: 6.3 g/dL (ref 6.0–8.3)

## 2023-12-24 NOTE — Progress Notes (Signed)
 Chief Complaint  Patient presents with   Follow-up    Follow Up     Manuel Landry is 78 y.o. pt here for dizziness.  Duration: 1 month Lightheaded in nature lasting 15-20 min. It is after he has been up for 30 min and not positional or exertional.  He had hip surgery on 7/10.  He is no longer taking as many pain medications and muscle relaxers.  He is still taking half of a Robaxin  nightly. Pass out? No Spinning? No Recent illness/fever? No Headache? No Neurologic signs? No Change in PO intake? Yes; has not eaten as much since having a surgery as he has been less active. May have seen a blood clot in urine but might've been Mucinex after spitting a bit in the toilet.  Palpitations? No  Past Medical History:  Diagnosis Date   Allergy    Anxiety    Arthritis    Blind loop syndrome    Bulging discs    LOWER BACK   Cataract    beginnings of   Diverticulosis    Family history of malignant neoplasm of gastrointestinal tract    History of adenomatous polyp of colon    History of Barrett's esophagus    History of chronic gastritis    Hyperlipidemia    no meds taken   IBS (irritable bowel syndrome)    Internal hemorrhoids    Malignant neoplasm of prostate [facility]) urologist-  dr dahlstedt/  oncologist-  dr patrcia   dx 08-09-2017-- Stage T2a, Gleason 4+5, PSA 3.22 (on finateride)--- treatment ADT and external beam radiation   PONV (postoperative nausea and vomiting)    Seasonal allergic rhinitis    Tubular adenoma of colon    Wears glasses     BP 118/80 (BP Location: Left Arm, Patient Position: Standing)   Pulse 88   Temp 98 F (36.7 C) (Oral)   Resp 16   Ht 5' 11 (1.803 m)   Wt 180 lb 3.2 oz (81.7 kg)   SpO2 97%   BMI 25.13 kg/m  General: Awake, alert, appears stated age Eyes: PERRLA, EOMi Ears: Patent, TM's neg b/l Heart: RRR, no murmurs, no carotid bruits Lungs: CTAB, no accessory muscle use MSK: 5/5 strength throughout, gait normal Neuro: No cerebellar  signs, patellar reflex 3/4 b/l wo clonus, calcaneal reflex 0/4 b/l wo clonus, biceps reflex 1/4 b/l wo clonus; Dix-Hall-Pike negative bilaterally. Psych: Age appropriate judgment and insight, normal mood and affect  Lightheaded - Plan: CBC, Comprehensive metabolic panel with GFR  Hematuria, unspecified type - Plan: Urine Microscopic Only  Stay hydrated.  Check labs and urine.  He is unsure if he actually had a blood clot or if it was Mucinex he spit out.  No issues since a few nights ago.  Could be lingering effects from Robaxin .  Also recommend he eat earlier to see if it affects his symptoms. F/u prn. Pt voiced understanding and agreement to the plan.  Manuel Mt Chance, DO 12/24/23 12:17 PM

## 2023-12-24 NOTE — Patient Instructions (Addendum)
 Give us  2-3 business days to get the results of your labs back.   Stay hydrated.   Consider eating earlier.   Let us  know if you need anything.

## 2023-12-25 ENCOUNTER — Ambulatory Visit: Payer: Self-pay | Admitting: Family Medicine

## 2023-12-27 ENCOUNTER — Other Ambulatory Visit: Payer: Self-pay

## 2023-12-27 DIAGNOSIS — D649 Anemia, unspecified: Secondary | ICD-10-CM

## 2023-12-27 DIAGNOSIS — E876 Hypokalemia: Secondary | ICD-10-CM

## 2023-12-27 DIAGNOSIS — R319 Hematuria, unspecified: Secondary | ICD-10-CM

## 2023-12-29 ENCOUNTER — Other Ambulatory Visit (INDEPENDENT_AMBULATORY_CARE_PROVIDER_SITE_OTHER)

## 2023-12-29 DIAGNOSIS — D649 Anemia, unspecified: Secondary | ICD-10-CM

## 2023-12-29 DIAGNOSIS — E876 Hypokalemia: Secondary | ICD-10-CM

## 2023-12-29 DIAGNOSIS — R319 Hematuria, unspecified: Secondary | ICD-10-CM | POA: Diagnosis not present

## 2023-12-29 LAB — CBC WITH DIFFERENTIAL/PLATELET
Basophils Absolute: 0 K/uL (ref 0.0–0.1)
Basophils Relative: 0.9 % (ref 0.0–3.0)
Eosinophils Absolute: 0.3 K/uL (ref 0.0–0.7)
Eosinophils Relative: 5.7 % — ABNORMAL HIGH (ref 0.0–5.0)
HCT: 38.9 % — ABNORMAL LOW (ref 39.0–52.0)
Hemoglobin: 13 g/dL (ref 13.0–17.0)
Lymphocytes Relative: 15.3 % (ref 12.0–46.0)
Lymphs Abs: 0.7 K/uL (ref 0.7–4.0)
MCHC: 33.5 g/dL (ref 30.0–36.0)
MCV: 87.5 fl (ref 78.0–100.0)
Monocytes Absolute: 0.5 K/uL (ref 0.1–1.0)
Monocytes Relative: 10.3 % (ref 3.0–12.0)
Neutro Abs: 3.1 K/uL (ref 1.4–7.7)
Neutrophils Relative %: 67.8 % (ref 43.0–77.0)
Platelets: 262 K/uL (ref 150.0–400.0)
RBC: 4.45 Mil/uL (ref 4.22–5.81)
RDW: 13.4 % (ref 11.5–15.5)
WBC: 4.6 K/uL (ref 4.0–10.5)

## 2023-12-29 LAB — BASIC METABOLIC PANEL WITH GFR
BUN: 17 mg/dL (ref 7–25)
CO2: 27 mmol/L (ref 20–32)
Calcium: 9.2 mg/dL (ref 8.6–10.3)
Chloride: 102 mmol/L (ref 98–110)
Creat: 0.91 mg/dL (ref 0.70–1.28)
Glucose, Bld: 96 mg/dL (ref 65–99)
Potassium: 5.1 mmol/L (ref 3.5–5.3)
Sodium: 137 mmol/L (ref 135–146)
eGFR: 86 mL/min/1.73m2 (ref >=60)

## 2023-12-29 LAB — IBC PANEL
Iron: 63 ug/dL (ref 42–165)
Saturation Ratios: 23.2 % (ref 20.0–50.0)
TIBC: 271.6 ug/dL (ref 250.0–450.0)
Transferrin: 194 mg/dL — ABNORMAL LOW (ref 212.0–360.0)

## 2023-12-30 ENCOUNTER — Ambulatory Visit: Payer: Self-pay | Admitting: Family Medicine

## 2023-12-30 ENCOUNTER — Encounter: Payer: Self-pay | Admitting: Family Medicine

## 2023-12-30 LAB — URINE CULTURE
MICRO NUMBER:: 16825849
Result:: NO GROWTH
SPECIMEN QUALITY:: ADEQUATE

## 2024-01-11 ENCOUNTER — Other Ambulatory Visit (INDEPENDENT_AMBULATORY_CARE_PROVIDER_SITE_OTHER): Payer: Self-pay

## 2024-01-11 ENCOUNTER — Other Ambulatory Visit (HOSPITAL_BASED_OUTPATIENT_CLINIC_OR_DEPARTMENT_OTHER): Payer: Self-pay

## 2024-01-11 ENCOUNTER — Ambulatory Visit (INDEPENDENT_AMBULATORY_CARE_PROVIDER_SITE_OTHER): Admitting: Physician Assistant

## 2024-01-11 DIAGNOSIS — Z96642 Presence of left artificial hip joint: Secondary | ICD-10-CM

## 2024-01-11 MED ORDER — AMOXICILLIN 500 MG PO CAPS
ORAL_CAPSULE | ORAL | 2 refills | Status: AC
  Filled 2024-01-11: qty 8, 2d supply, fill #0
  Filled 2024-01-13: qty 8, 2d supply, fill #1

## 2024-01-11 NOTE — Progress Notes (Signed)
 Post-Op Visit Note   Patient: Manuel Landry           Date of Birth: 04/08/1946           MRN: [id number] Visit Date: 01/11/2024 PCP: Frann Mabel Mt, DO   Assessment & Plan:  Chief Complaint:  Chief Complaint  Patient presents with   Left Hip - Follow-up    Left total hip arthroplasty 11/25/2023   Visit Diagnoses:  1. Status post total replacement of left hip     Plan: Is a pleasant 78 year old gentleman who comes in today 7 weeks status post left total hip replacement 11/25/2023.  He has been doing well.  He still notes some lateral numbness but no pain.  Currently walking without assistance.  Examination of the left hip reveals painless hip flexion and logroll.  He is neurovascularly intact distally.  At this point, he will continue to advance with activity as tolerated.  Dental prophylaxis reinforced.  Follow-up in 5 weeks for recheck.  Call with concerns or questions.  Follow-Up Instructions: Return in about 5 weeks (around 02/15/2024).   Orders:  Orders Placed This Encounter  Procedures   XR Pelvis 1-2 Views   Meds ordered this encounter  Medications   amoxicillin  (AMOXIL ) 500 MG capsule    Sig: Take 4 pills one hour prior to dental work    Dispense:  8 capsule    Refill:  2    Imaging: XR Pelvis 1-2 Views Result Date: 01/11/2024 Well seated prosthesis without complication   PMFS History: Patient Active Problem List   Diagnosis Date Noted   Status post total replacement of left hip 11/25/2023   Primary osteoarthritis of left hip 11/24/2023   Elevated blood pressure reading 06/11/2022   Seasonal allergies 03/03/2022   Chronic rhinitis 03/06/2021   Nocturnal muscle cramp 09/02/2020   GAD (generalized anxiety disorder) 09/02/2020   Closed nondisplaced fracture of body of left scapula 04/15/2020   Situational anxiety 12/27/2018   Trigger finger of left thumb 11/17/2018   Hyperlipidemia 01/27/2017   Cardiovascular risk factor 08/12/2011   S/P  cholecystectomy 06/22/2011   History of colonic polyps 06/22/2011   FLATULENCE-GAS-BLOATING 02/13/2010   Calculus of gallbladder 02/11/2010   GERD 02/10/2010   Diverticulosis of colon 02/10/2010   Nausea 02/10/2010   ABDOMINAL PAIN RIGHT UPPER QUADRANT 02/10/2010   ABDOMINAL PAIN, EPIGASTRIC 02/10/2010   ANEMIA-IRON DEFICIENCY 07/17/2008   Inguinal hernia 07/17/2008   IRRITABLE BOWEL SYNDROME 07/17/2008   Gastritis and gastroduodenitis 07/11/2008   Duodenitis 07/11/2008   RENAL CYST, RIGHT 07/11/2008   DYSPEPSIA 06/12/2008   Constipation 05/31/2008   Backache 05/31/2008   BENIGN PROSTATIC HYPERTROPHY, MILD, HX OF 05/31/2008   Past Medical History:  Diagnosis Date   Allergy    Anxiety    Arthritis    Blind loop syndrome    Bulging discs    LOWER BACK   Cataract    beginnings of   Diverticulosis    Family history of malignant neoplasm of gastrointestinal tract    History of adenomatous polyp of colon    History of Barrett's esophagus    History of chronic gastritis    Hyperlipidemia    no meds taken   IBS (irritable bowel syndrome)    Internal hemorrhoids    Malignant neoplasm of prostate [facility]) urologist-  dr dahlstedt/  oncologist-  dr patrcia   dx 08-09-2017-- Stage T2a, Gleason 4+5, PSA 3.22 (on finateride)--- treatment ADT and external beam radiation   PONV (postoperative  nausea and vomiting)    Seasonal allergic rhinitis    Tubular adenoma of colon    Wears glasses     Family History  Problem Relation Age of Onset   Colon cancer Mother 36   Cancer Mother    Diabetes Maternal Grandmother    COPD Sister    Heart failure Father 103   Esophageal cancer Neg Hx    Rectal cancer Neg Hx    Stomach cancer Neg Hx     Past Surgical History:  Procedure Laterality Date   bone spur Right    right foot   CATARACT EXTRACTION Bilateral    COLONOSCOPY  last one 10-21-2016  dr albertus   CYSTOSCOPY WITH INSERTION OF UROLIFT N/A 12/15/2017   Procedure: CYSTOSCOPY WITH  INSERTION OF UROLIFT, BIOPSY  PROSTATIC URETHAL;  Surgeon: Matilda Senior, MD;  Location: [facility];  Service: Urology;  Laterality: N/A;   ESOPHAGOGASTRODUODENOSCOPY (EGD) WITH PROPOFOL   last one 09-26-2013   dr pyrtle   INGUINAL HERNIA REPAIR Bilateral right 10-25-1997:  left 09-28-2008 (dr m. gladis @ [facility])   LAPAROSCOPIC CHOLECYSTECTOMY  2012   MANDIBLE SURGERY Bilateral 1990s   removal calcified bone growths   ROTATOR CUFF REPAIR Right 03-26-2009   dr duwayne  [facility]   SHOULDER OPEN ROTATOR CUFF REPAIR  03/17/2012   Procedure: ROTATOR CUFF REPAIR SHOULDER OPEN;  Surgeon: Reyes JAYSON duwayne, MD;  Location: [facility];  Service: Orthopedics;  Laterality: Left;  LEFT MINI OPEN ROTATOR CUFF REPAIR    SPACE OAR INSTILLATION N/A 12/15/2017   Procedure: SPACE OAR INSTILLATION;  Surgeon: Matilda Senior, MD;  Location[facility];  Service: Urology;  Laterality: N/A;   TONSILLECTOMY  child   TOTAL HIP ARTHROPLASTY Left 11/25/2023   Procedure: LEFT TOTAL HIP ARTHROPLASTY ANTERIOR APPROACH;  Surgeon: Jerri Kay HERO, MD;  Location: [facility];  Service: Orthopedics;  Laterality: Left;  3-C   UPPER GASTROINTESTINAL ENDOSCOPY     VASECTOMY     Social History   Occupational History   Occupation: Retired    Associate Professor: RETIRED  Tobacco Use   Smoking status: Never   Smokeless tobacco: Never   Tobacco comments:    Never smoke 11/13/21  Vaping Use   Vaping status: Never Used  Substance and Sexual Activity   Alcohol  use: Yes    Alcohol /week: 2.0 standard drinks of alcohol     Types: 2 Glasses of wine per week    Comment: occasional   Drug use: Never   Sexual activity: Not Currently    Birth control/protection: None    Comment: vasectomy 2011

## 2024-01-12 ENCOUNTER — Encounter: Payer: Self-pay | Admitting: Orthopaedic Surgery

## 2024-01-13 ENCOUNTER — Other Ambulatory Visit (HOSPITAL_BASED_OUTPATIENT_CLINIC_OR_DEPARTMENT_OTHER): Payer: Self-pay

## 2024-01-13 NOTE — Telephone Encounter (Signed)
He can stop now 

## 2024-02-10 ENCOUNTER — Other Ambulatory Visit: Payer: Self-pay | Admitting: Medical Genetics

## 2024-02-14 ENCOUNTER — Telehealth: Payer: Self-pay | Admitting: *Deleted

## 2024-02-14 ENCOUNTER — Other Ambulatory Visit: Payer: Self-pay

## 2024-02-14 ENCOUNTER — Ambulatory Visit (INDEPENDENT_AMBULATORY_CARE_PROVIDER_SITE_OTHER): Admitting: *Deleted

## 2024-02-14 VITALS — Ht 71.0 in | Wt 180.0 lb

## 2024-02-14 DIAGNOSIS — Z0000 Encounter for general adult medical examination without abnormal findings: Secondary | ICD-10-CM

## 2024-02-14 DIAGNOSIS — Z125 Encounter for screening for malignant neoplasm of prostate: Secondary | ICD-10-CM

## 2024-02-14 NOTE — Patient Instructions (Signed)
 Mr. Buckner , Thank you for taking time out of your busy schedule to complete your Annual Wellness Visit with me. I enjoyed our conversation and look forward to speaking with you again next year. I, as well as your care team,  appreciate your ongoing commitment to your health goals. Please review the following plan we discussed and let me know if I can assist you in the future. Your Game plan/ To Do List    Follow up Visits:  Next Medicare AWV with our clinical staff: 02/16/25 10:20am, telephone    Next Office Visit with your provider: 03/14/24 9am, Dr Frann  Clinician Recommendations:  Aim for 30 minutes of exercise or brisk walking, 6-8 glasses of water , and 5 servings of fruits and vegetables each day.       This is a list of the screening recommended for you and due dates:  Health Maintenance  Topic Date Due   Flu Shot  12/17/2023   Medicare Annual Wellness Visit  01/21/2024   COVID-19 Vaccine (3 - Moderna risk series) 02/13/2025*   DTaP/Tdap/Td vaccine (5 - Td or Tdap) 02/22/2029   Pneumococcal Vaccine for age over 66  Completed   Hepatitis C Screening  Completed   Zoster (Shingles) Vaccine  Completed   HPV Vaccine  Aged Out   Meningitis B Vaccine  Aged Out   Colon Cancer Screening  Discontinued  *Topic was postponed. The date shown is not the original due date.    Advanced directives: (In Chart) A copy of your advanced directives are scanned into your chart should your provider ever need it. Advance Care Planning is important because it:  [x]  Makes sure you receive the medical care that is consistent with your values, goals, and preferences  [x]  It provides guidance to your family and loved ones and reduces their decisional burden about whether or not they are making the right decisions based on your wishes.  Follow the link provided in your after visit summary or read over the paperwork we have mailed to you to help you started getting your Advance Directives in place.  If you need assistance in completing these, please reach out to us  so that we can help you!  See attachments for Preventive Care and Fall Prevention Tips.

## 2024-02-14 NOTE — Progress Notes (Signed)
 Subjective:   Manuel Landry is a 78 y.o. who presents for a Medicare Wellness preventive visit.  As a reminder, Annual Wellness Visits don't include a physical exam, and some assessments may be limited, especially if this visit is performed virtually. We may recommend an in-person follow-up visit with your provider if needed.  Visit Complete: Virtual I connected with  Curtistine LITTIE Jester on 02/14/24 by a audio enabled telemedicine application and verified that I am speaking with the correct person using two identifiers.  Patient Location: Home  Provider Location: Office/Clinic  I discussed the limitations of evaluation and management by telemedicine. The patient expressed understanding and agreed to proceed.  Vital Signs: Because this visit was a virtual/telehealth visit, some criteria may be missing or patient reported. Any vitals not documented were not able to be obtained and vitals that have been documented are patient reported.  VideoDeclined- This patient declined Librarian, academic. Therefore the visit was completed with audio only.  Persons Participating in Visit: Patient.  AWV Questionnaire: Yes: Patient Medicare AWV questionnaire was completed by the patient on 02/07/24; I have confirmed that all information answered by patient is correct and no changes since this date.  Cardiac Risk Factors include: male gender;advanced age (>32men, >67 women);dyslipidemia     Objective:    Today's Vitals   02/14/24 1028  Weight: 180 lb (81.6 kg)  Height: 5' 11 (1.803 m)   Body mass index is 25.1 kg/m.     02/14/2024   10:37 AM 11/25/2023    7:25 AM 11/17/2023    9:56 AM 03/04/2023   11:20 AM 01/21/2023   10:46 AM 01/14/2022   10:26 AM 10/19/2021   10:36 AM  Advanced Directives  Does Patient Have a Medical Advance Directive? Yes Yes Yes Yes Yes Yes Yes  Type of Estate agent of [place];Living will Healthcare Power of [employer] Power of [employer] Power of [place];Living will Healthcare Power of [place];Living will   Does patient want to make changes to medical advance directive? No - Patient declined No - Patient declined   No - Patient declined No - Patient declined No - Patient declined  Copy of Healthcare Power of Attorney in Chart? Yes - validated most recent copy scanned in chart (See row information) No - copy requested Yes - validated most recent copy scanned in chart (See row information)  Yes - validated most recent copy scanned in chart (See row information)      Current Medications (verified) Outpatient Encounter Medications as of 02/14/2024  Medication Sig   amoxicillin  (AMOXIL ) 500 MG capsule Take 4 pills one hour prior to dental work   aspirin  (ASPIRIN  81) 81 MG chewable tablet Chew 1 tablet (81 mg total) by mouth 2 (two) times daily. To be taken after surgery to prevent blood clots.   azelastine  (ASTELIN ) 0.1 % nasal spray Place 1 spray into both nostrils 2 (two) times daily. Use in each nostril as directed (Patient taking differently: Place 1 spray into both nostrils 2 (two) times daily as needed for allergies. Use in each nostril as directed)   diphenhydrAMINE  (BENADRYL ) 25 MG tablet Take 25 mg by mouth at bedtime as needed for allergies.   docusate sodium  (COLACE) 100 MG capsule Take 1 capsule (100 mg total) by mouth daily as needed.   fluticasone  (FLONASE ) 50 MCG/ACT nasal spray SPRAY 2 SPRAYS INTO EACH NOSTRIL EVERY DAY (Patient taking differently: Place 1 spray into both nostrils daily as  needed for allergies.)   MAGNESIUM  PO Take 1 capsule by mouth in the morning.   Misc Natural Products (NEURIVA PO) Take 1 capsule by mouth in the morning.   Multiple Vitamins-Minerals (OCUVITE ADULT 50+) CAPS Take 1 capsule by mouth in the morning.   omeprazole  (PRILOSEC) 20 MG capsule Take 20 mg by mouth every other day. Takes Mon,Wed, Fri   saccharomyces boulardii (FLORASTOR) 250 MG capsule  Take 250 mg by mouth in the morning.   sertraline  (ZOLOFT ) 50 MG tablet Take 1 tablet (50 mg total) by mouth daily.   tadalafil  (CIALIS ) 5 MG tablet Take 1 tablet (5 mg total) by mouth daily. (Patient taking differently: Take 5 mg by mouth daily with supper.)   methocarbamol  (ROBAXIN ) 500 MG tablet Take 1 tablet (500 mg total) by mouth 2 (two) times daily as needed.   [DISCONTINUED] chlorhexidine  (HIBICLENS ) 4 % external liquid Apply 15 mLs (1 Application total) topically as directed for 30 doses. Use as directed daily for 5 days every other week for 6 weeks.   [DISCONTINUED] docusate sodium  (COLACE) 100 MG capsule Take 200 mg by mouth 2 (two) times daily as needed (constipation.).   No facility-administered encounter medications on file as of 02/14/2024.    Allergies (verified) Gabapentin    History: Past Medical History:  Diagnosis Date   Allergy    Anxiety    Arthritis    Blind loop syndrome    Bulging discs    LOWER BACK   Cataract    beginnings of   Diverticulosis    Family history of malignant neoplasm of gastrointestinal tract    History of adenomatous polyp of colon    History of Barrett's esophagus    History of chronic gastritis    Hyperlipidemia    no meds taken   IBS (irritable bowel syndrome)    Internal hemorrhoids    Malignant neoplasm of prostate [facility]) urologist-  dr dahlstedt/  oncologist-  dr patrcia   dx 08-09-2017-- Stage T2a, Gleason 4+5, PSA 3.22 (on finateride)--- treatment ADT and external beam radiation   PONV (postoperative nausea and vomiting)    Seasonal allergic rhinitis    Tubular adenoma of colon    Wears glasses    Past Surgical History:  Procedure Laterality Date   bone spur Right    right foot   CATARACT EXTRACTION Bilateral 2024   COLONOSCOPY  last one 10-21-2016  dr albertus   CYSTOSCOPY WITH INSERTION OF UROLIFT N/A 12/15/2017   Procedure: CYSTOSCOPY WITH INSERTION OF UROLIFT, BIOPSY  PROSTATIC URETHAL;  Surgeon: Matilda Senior,  MD;  Location: [facility];  Service: Urology;  Laterality: N/A;   ESOPHAGOGASTRODUODENOSCOPY (EGD) WITH PROPOFOL   last one 09-26-2013   dr pyrtle   INGUINAL HERNIA REPAIR Bilateral right 10-25-1997:  left 09-28-2008 (dr m. gladis @ [facility])   LAPAROSCOPIC CHOLECYSTECTOMY  2012   MANDIBLE SURGERY Bilateral 1990s   removal calcified bone growths   ROTATOR CUFF REPAIR Right 03-26-2009   dr duwayne  [facility]   SHOULDER OPEN ROTATOR CUFF REPAIR  03/17/2012   Procedure: ROTATOR CUFF REPAIR SHOULDER OPEN;  Surgeon: Reyes JAYSON duwayne, MD;  Location[facility];  Service: Orthopedics;  Laterality: Left;  LEFT MINI OPEN ROTATOR CUFF REPAIR    SPACE OAR INSTILLATION N/A 12/15/2017   Procedure: SPACE OAR INSTILLATION;  Surgeon: Matilda Senior, MD;  Location: [facility];  Service: Urology;  Laterality: N/A;   TONSILLECTOMY  child   TOTAL HIP ARTHROPLASTY Left 11/25/2023  Procedure: LEFT TOTAL HIP ARTHROPLASTY ANTERIOR APPROACH;  Surgeon: Jerri Kay HERO, MD;  Location: [facility];  Service: Orthopedics;  Laterality: Left;  3-C   UPPER GASTROINTESTINAL ENDOSCOPY     VASECTOMY     Family History  Problem Relation Age of Onset   Colon cancer Mother 55   Cancer Mother    Diabetes Maternal Grandmother    COPD Sister    Heart failure Father 72   Esophageal cancer Neg Hx    Rectal cancer Neg Hx    Stomach cancer Neg Hx    Social History   Socioeconomic History   Marital status: Married    Spouse name: Montie   Number of children: 0   Years of education: Not on file   Highest education level: Bachelor's degree (e.g., BA, AB, BS)  Occupational History   Occupation: Retired    Associate Professor: RETIRED  Tobacco Use   Smoking status: Never   Smokeless tobacco: Never   Tobacco comments:    Never smoke 11/13/21  Vaping Use   Vaping status: Never Used  Substance and Sexual Activity   Alcohol  use: Yes    Alcohol /week: 2.0 standard drinks of alcohol     Types: 2 Glasses of wine per week     Comment: occasional   Drug use: Never   Sexual activity: Not Currently    Birth control/protection: None    Comment: vasectomy 2011  Other Topics Concern   Not on file  Social History Narrative   Not on file   Social Drivers of Health   Financial Resource Strain: Low Risk  (02/14/2024)   Overall Financial Resource Strain (CARDIA)    Difficulty of Paying Living Expenses: Not very hard  Food Insecurity: No Food Insecurity (02/14/2024)   Hunger Vital Sign    Worried About Running Out of Food in the Last Year: Never true    Ran Out of Food in the Last Year: Never true  Transportation Needs: No Transportation Needs (02/14/2024)   PRAPARE - Administrator, Civil Service (Medical): No    Lack of Transportation (Non-Medical): No  Physical Activity: Insufficiently Active (02/14/2024)   Exercise Vital Sign    Days of Exercise per Week: 7 days    Minutes of Exercise per Session: 20 min  Stress: No Stress Concern Present (02/14/2024)   [employer] of Occupational Health - Occupational Stress Questionnaire    Feeling of Stress: Not at all  Social Connections: Moderately Integrated (02/14/2024)   Social Connection and Isolation Panel    Frequency of Communication with Friends and Family: Twice a week    Frequency of Social Gatherings with Friends and Family: More than three times a week    Attends Religious Services: Never    Database administrator or Organizations: No    Attends Engineer, structural: More than 4 times per year    Marital Status: Married  Recent Concern: Social Connections - Moderately Isolated (12/21/2023)   Social Connection and Isolation Panel    Frequency of Communication with Friends and Family: Twice a week    Frequency of Social Gatherings with Friends and Family: Once a week    Attends Religious Services: Never    Database administrator or Organizations: No    Attends Engineer, structural: Not on file    Marital Status: Married     Tobacco Counseling Counseling given: Not Answered Tobacco comments: Never smoke 11/13/21    Clinical Intake:  Pre-visit preparation completed:  Yes  Pain : No/denies pain     BMI - recorded: 25.1 Nutritional Status: BMI 25 -29 Overweight Nutritional Risks: None Diabetes: No  No results found for: HGBA1C   How often do you need to have someone help you when you read instructions, pamphlets, or other written materials from your doctor or pharmacy?: 1 - Never What is the last grade level you completed in school?: bachelor's degree  Interpreter Needed?: No  Information entered by :: Lolita Libra, CMA(AAMA)   Activities of Daily Living     02/07/2024    3:00 PM 11/25/2023    4:00 PM  In your present state of health, do you have any difficulty performing the following activities:  Hearing? 0 1  Comment  hearing aids  Vision? 0 0  Difficulty concentrating or making decisions? 0 0  Walking or climbing stairs? 0   Dressing or bathing? 0   Doing errands, shopping? 0 0  Preparing Food and eating ? N   Using the Toilet? N   In the past six months, have you accidently leaked urine? N   Do you have problems with loss of bowel control? N   Managing your Medications? N   Managing your Finances? N   Housekeeping or managing your Housekeeping? N     Patient Care Team: Frann Mabel Mt, DO as PCP - General (Family Medicine) Verlin Lonni BIRCH, MD as PCP - Cardiology (Cardiology) Matilda Senior, MD as Consulting Physician (Urology) Joshua Sieving, MD as Consulting Physician (Dermatology) Verlin Lonni BIRCH, MD as Consulting Physician (Cardiology) Pyrtle, Gordy HERO, MD as Consulting Physician (Gastroenterology) Magdalen Pasco RAMAN, DPM as Consulting Physician (Podiatry) Marcey Elspeth PARAS, MD as Consulting Physician (Ophthalmology)  I have updated your Care Teams any recent Medical Services you may have received from other providers in the past year.      Assessment:   This is a routine wellness examination for Garl.  Hearing/Vision screen Hearing Screening - Comments:: Wears hearing aids Vision Screening - Comments:: Up to date with routine eye exams with Dr Marcey / [facility]   Goals Addressed   None    Depression Screen     02/14/2024   10:36 AM 06/30/2023    2:40 PM 03/02/2023    2:06 PM 01/21/2023   11:04 AM 07/10/2022    9:15 AM 01/14/2022   10:38 AM 01/08/2021    9:50 AM  PHQ 2/9 Scores  PHQ - 2 Score 0 0 0 0 0 0 0  PHQ- 9 Score 0 0   0      Fall Risk     02/07/2024    3:00 PM 06/30/2023    2:40 PM 03/02/2023    2:06 PM 01/20/2023    6:50 PM 07/10/2022    9:14 AM  Fall Risk   Falls in the past year? 0 0 0 0 0  Number falls in past yr: 0 0 0 0 0  Injury with Fall? 0 0 0 0 0  Risk for fall due to : Orthopedic patient   No Fall Risks No Fall Risks  Follow up Education provided Falls evaluation completed Falls evaluation completed Falls evaluation completed Falls evaluation completed    MEDICARE RISK AT HOME:  Medicare Risk at Home Any stairs in or around the home?: (Patient-Rptd) No Home free of loose throw rugs in walkways, pet beds, electrical cords, etc?: (Patient-Rptd) Yes Adequate lighting in your home to reduce risk of falls?: (Patient-Rptd) Yes Life alert?: (Patient-Rptd) Yes Use  of a cane, walker or w/c?: (Patient-Rptd) No Grab bars in the bathroom?: (Patient-Rptd) Yes Shower chair or bench in shower?: (Patient-Rptd) Yes Elevated toilet seat or a handicapped toilet?: (Patient-Rptd) Yes  TIMED UP AND GO:  Was the test performed?  No,audio  Cognitive Function: 6CIT completed        02/14/2024   10:38 AM 01/21/2023   11:06 AM 01/14/2022   10:39 AM  6CIT Screen  What Year? 0 points 0 points 0 points  What month? 0 points 0 points 0 points  What time? 0 points 0 points 0 points  Count back from 20 0 points 0 points 0 points  Months in reverse 0 points 0 points 4 points  Repeat phrase 0 points 0 points 0  points  Total Score 0 points 0 points 4 points    Immunizations Immunization History  Administered Date(s) Administered   Fluad Quad(high Dose 65+) 03/04/2021, 02/18/2023   INFLUENZA, HIGH DOSE SEASONAL PF 03/01/2012, 04/20/2013, 02/16/2014, 03/08/2015, 02/25/2016, 01/27/2017, 03/07/2018, 02/21/2019, 02/13/2020, 03/04/2021, 02/10/2022   Influenza-Unspecified 03/01/2009, 03/11/2010, 03/10/2011   Moderna Sars-Covid-2 Vaccination 05/16/2019, 06/13/2019   Pneumococcal Conjugate-13 08/15/2013   Pneumococcal Polysaccharide-23 08/05/2011   Td 08/05/2003   Tdap 09/07/2011, 02/23/2019   Tetanus 10/27/2010   Zoster Recombinant(Shingrix) 01/23/2021, 10/16/2021, 01/21/2022   Zoster, Live 07/14/2010    Screening Tests Health Maintenance  Topic Date Due   Influenza Vaccine  12/17/2023   COVID-19 Vaccine (3 - Moderna risk series) 02/13/2025 (Originally 07/11/2019)   Medicare Annual Wellness (AWV)  02/13/2025   DTaP/Tdap/Td (5 - Td or Tdap) 02/22/2029   Pneumococcal Vaccine: 50+ Years  Completed   Hepatitis C Screening  Completed   Zoster Vaccines- Shingrix  Completed   HPV VACCINES  Aged Out   Meningococcal B Vaccine  Aged Out   Colonoscopy  Discontinued    Health Maintenance Items Addressed: Will get flu vaccine at [facility]  Additional Screening:  Vision Screening: Recommended annual ophthalmology exams for early detection of glaucoma and other disorders of the eye. Is the patient up to date with their annual eye exam?  Yes  Who is the provider or what is the name of the office in which the patient attends annual eye exams? Dr Marcey / [facility]  Dental Screening: Recommended annual dental exams for proper oral hygiene  Community Resource Referral / Chronic Care Management: CRR required this visit?  No   CCM required this visit?  No   Plan:    I have personally reviewed and noted the following in the patient's chart:   Medical and social history Use of alcohol , tobacco or  illicit drugs  Current medications and supplements including opioid prescriptions. Patient is not currently taking opioid prescriptions. Functional ability and status Nutritional status Physical activity Advanced directives List of other physicians Hospitalizations, surgeries, and ER visits in previous 12 months Vitals Screenings to include cognitive, depression, and falls Referrals and appointments  In addition, I have reviewed and discussed with patient certain preventive protocols, quality metrics, and best practice recommendations. A written personalized care plan for preventive services as well as general preventive health recommendations were provided to patient.   Lolita Libra, CMA   02/14/2024   After Visit Summary: (MyChart) Due to this being a telephonic visit, the after visit summary with patients personalized plan was offered to patient via MyChart   Notes: Nothing significant to report at this time.

## 2024-02-14 NOTE — Telephone Encounter (Signed)
 Pt scheduled a lab appt for 02/29/24 here stating Dr Frann told him he would order the PSA level for his urologist so he wouldn't have to go to [facility] lab as we are closer.  Please place future order if appropriate. If not, please call pt to cancel lab appt here.

## 2024-02-15 ENCOUNTER — Other Ambulatory Visit: Payer: Self-pay

## 2024-02-15 NOTE — Telephone Encounter (Signed)
 Lab test has been ordered and message sent to pt.

## 2024-02-17 ENCOUNTER — Encounter: Payer: Self-pay | Admitting: Family Medicine

## 2024-02-17 DIAGNOSIS — Z23 Encounter for immunization: Secondary | ICD-10-CM | POA: Diagnosis not present

## 2024-02-28 ENCOUNTER — Telehealth: Payer: Self-pay | Admitting: Family Medicine

## 2024-02-28 ENCOUNTER — Other Ambulatory Visit: Payer: Self-pay

## 2024-02-28 DIAGNOSIS — Z8546 Personal history of malignant neoplasm of prostate: Secondary | ICD-10-CM

## 2024-02-28 NOTE — Telephone Encounter (Signed)
 This pt is on the lab schedule but I need lab orders if needed.

## 2024-02-29 ENCOUNTER — Other Ambulatory Visit (INDEPENDENT_AMBULATORY_CARE_PROVIDER_SITE_OTHER)

## 2024-02-29 ENCOUNTER — Ambulatory Visit (INDEPENDENT_AMBULATORY_CARE_PROVIDER_SITE_OTHER): Admitting: Orthopaedic Surgery

## 2024-02-29 DIAGNOSIS — Z8546 Personal history of malignant neoplasm of prostate: Secondary | ICD-10-CM

## 2024-02-29 DIAGNOSIS — Z96642 Presence of left artificial hip joint: Secondary | ICD-10-CM

## 2024-02-29 NOTE — Progress Notes (Signed)
 Post-Op Visit Note   Patient: Manuel Landry           Date of Birth: September 13, 1945           MRN: [id number] Visit Date: 02/29/2024 PCP: Frann Mabel Mt, DO   Assessment & Plan:  Chief Complaint:  Chief Complaint  Patient presents with   Left Hip - Follow-up    11/25/2023- L THA   Visit Diagnoses:  1. Status post total replacement of left hip     Plan: History of Present Illness Manuel Landry is a 78 year old male who presents for a follow-up visit three months after left hip replacement surgery.  He is satisfied with the function of his left hip three months post-replacement and has been engaging in activities such as installing metal edging in his backyard, which required kneeling. He performs morning exercises to aid recovery.  He inquires about returning to pool activities, having access to an indoor saltwater pool and a therapy tub with jets. He plans to start slowly with pool exercises.  Left hip exam fully healed surgical scar.  Fluid painless motion.  Normal gait pattern.  Excellent quad strength.  Assessment and Plan Status post left hip replacement Three months post-surgery with satisfactory function and no pain. - Advise resumption of pool activities. - Recommend prophylactic antibiotics before dental cleanings for two years.  Follow-Up Instructions: Return in about 3 months (around 05/31/2024).   Orders:  No orders of the defined types were placed in this encounter.  No orders of the defined types were placed in this encounter.   Imaging: No results found.  PMFS History: Patient Active Problem List   Diagnosis Date Noted   Status post total replacement of left hip 11/25/2023   Primary osteoarthritis of left hip 11/24/2023   Elevated blood pressure reading 06/11/2022   Seasonal allergies 03/03/2022   Chronic rhinitis 03/06/2021   Nocturnal muscle cramp 09/02/2020   GAD (generalized anxiety disorder) 09/02/2020   Closed  nondisplaced fracture of body of left scapula 04/15/2020   Situational anxiety 12/27/2018   Trigger finger of left thumb 11/17/2018   Hyperlipidemia 01/27/2017   Cardiovascular risk factor 08/12/2011   S/P cholecystectomy 06/22/2011   History of colonic polyps 06/22/2011   FLATULENCE-GAS-BLOATING 02/13/2010   Calculus of gallbladder 02/11/2010   GERD 02/10/2010   Diverticulosis of colon 02/10/2010   Nausea 02/10/2010   ABDOMINAL PAIN RIGHT UPPER QUADRANT 02/10/2010   ABDOMINAL PAIN, EPIGASTRIC 02/10/2010   ANEMIA-IRON DEFICIENCY 07/17/2008   Inguinal hernia 07/17/2008   IRRITABLE BOWEL SYNDROME 07/17/2008   Gastritis and gastroduodenitis 07/11/2008   Duodenitis 07/11/2008   RENAL CYST, RIGHT 07/11/2008   DYSPEPSIA 06/12/2008   Constipation 05/31/2008   Backache 05/31/2008   BENIGN PROSTATIC HYPERTROPHY, MILD, HX OF 05/31/2008   Past Medical History:  Diagnosis Date   Allergy    Anxiety    Arthritis    Blind loop syndrome    Bulging discs    LOWER BACK   Cataract    beginnings of   Diverticulosis    Family history of malignant neoplasm of gastrointestinal tract    History of adenomatous polyp of colon    History of Barrett's esophagus    History of chronic gastritis    Hyperlipidemia    no meds taken   IBS (irritable bowel syndrome)    Internal hemorrhoids    Malignant neoplasm of prostate [facility]) urologist-  dr dahlstedt/  oncologist-  dr patrcia   dx 08-09-2017--  Stage T2a, Gleason 4+5, PSA 3.22 (on finateride)--- treatment ADT and external beam radiation   PONV (postoperative nausea and vomiting)    Seasonal allergic rhinitis    Tubular adenoma of colon    Wears glasses     Family History  Problem Relation Age of Onset   Colon cancer Mother 49   Cancer Mother    Diabetes Maternal Grandmother    COPD Sister    Heart failure Father 39   Esophageal cancer Neg Hx    Rectal cancer Neg Hx    Stomach cancer Neg Hx     Past Surgical History:  Procedure  Laterality Date   bone spur Right    right foot   CATARACT EXTRACTION Bilateral 2024   COLONOSCOPY  last one 10-21-2016  dr albertus   CYSTOSCOPY WITH INSERTION OF UROLIFT N/A 12/15/2017   Procedure: CYSTOSCOPY WITH INSERTION OF UROLIFT, BIOPSY  PROSTATIC URETHAL;  Surgeon: Matilda Senior, MD;  Location: [facility];  Service: Urology;  Laterality: N/A;   ESOPHAGOGASTRODUODENOSCOPY (EGD) WITH PROPOFOL   last one 09-26-2013   dr pyrtle   INGUINAL HERNIA REPAIR Bilateral right 10-25-1997:  left 09-28-2008 (dr m. gladis @ [facility])   LAPAROSCOPIC CHOLECYSTECTOMY  2012   MANDIBLE SURGERY Bilateral 1990s   removal calcified bone growths   ROTATOR CUFF REPAIR Right 03-26-2009   dr duwayne  [facility]   SHOULDER OPEN ROTATOR CUFF REPAIR  03/17/2012   Procedure: ROTATOR CUFF REPAIR SHOULDER OPEN;  Surgeon: Reyes JAYSON duwayne, MD;  Location[facility];  Service: Orthopedics;  Laterality: Left;  LEFT MINI OPEN ROTATOR CUFF REPAIR    SPACE OAR INSTILLATION N/A 12/15/2017   Procedure: SPACE OAR INSTILLATION;  Surgeon: Matilda Senior, MD;  Location: [facility];  Service: Urology;  Laterality: N/A;   TONSILLECTOMY  child   TOTAL HIP ARTHROPLASTY Left 11/25/2023   Procedure: LEFT TOTAL HIP ARTHROPLASTY ANTERIOR APPROACH;  Surgeon: Jerri Kay HERO, MD;  Location: [facility];  Service: Orthopedics;  Laterality: Left;  3-C   UPPER GASTROINTESTINAL ENDOSCOPY     VASECTOMY     Social History   Occupational History   Occupation: Retired    Associate Professor: RETIRED  Tobacco Use   Smoking status: Never   Smokeless tobacco: Never   Tobacco comments:    Never smoke 11/13/21  Vaping Use   Vaping status: Never Used  Substance and Sexual Activity   Alcohol  use: Yes    Alcohol /week: 2.0 standard drinks of alcohol     Types: 2 Glasses of wine per week    Comment: occasional   Drug use: Never   Sexual activity: Not Currently    Birth control/protection: None    Comment: vasectomy 2011

## 2024-03-01 ENCOUNTER — Other Ambulatory Visit

## 2024-03-01 ENCOUNTER — Ambulatory Visit: Payer: Self-pay | Admitting: Family Medicine

## 2024-03-01 DIAGNOSIS — Z006 Encounter for examination for normal comparison and control in clinical research program: Secondary | ICD-10-CM

## 2024-03-01 LAB — PSA: PSA: 0.28 ng/mL (ref 0.10–4.00)

## 2024-03-06 ENCOUNTER — Ambulatory Visit: Admitting: Family Medicine

## 2024-03-06 ENCOUNTER — Other Ambulatory Visit: Payer: Self-pay

## 2024-03-06 ENCOUNTER — Encounter: Payer: Self-pay | Admitting: Family Medicine

## 2024-03-06 ENCOUNTER — Other Ambulatory Visit (HOSPITAL_BASED_OUTPATIENT_CLINIC_OR_DEPARTMENT_OTHER): Payer: Self-pay

## 2024-03-06 VITALS — BP 130/82 | HR 79 | Temp 98.0°F | Resp 16 | Ht 71.0 in | Wt 180.0 lb

## 2024-03-06 DIAGNOSIS — F411 Generalized anxiety disorder: Secondary | ICD-10-CM | POA: Diagnosis not present

## 2024-03-06 DIAGNOSIS — G629 Polyneuropathy, unspecified: Secondary | ICD-10-CM | POA: Insufficient documentation

## 2024-03-06 DIAGNOSIS — H6993 Unspecified Eustachian tube disorder, bilateral: Secondary | ICD-10-CM

## 2024-03-06 MED ORDER — DULOXETINE HCL 30 MG PO CPEP
30.0000 mg | ORAL_CAPSULE | Freq: Every day | ORAL | 3 refills | Status: DC
  Filled 2024-03-06: qty 30, 30d supply, fill #0
  Filled 2024-03-28 – 2024-04-03 (×2): qty 30, 30d supply, fill #1

## 2024-03-06 NOTE — Progress Notes (Signed)
 Chief Complaint  Patient presents with   Follow-up    Follow Up    Subjective Manuel Landry presents for f/u anxiety.  Pt is currently being treated with Zoloft  50 mg/d.  Reports doing well since treatment. No thoughts of harming self or others. No self-medication with alcohol , prescription drugs or illicit drugs. Pt is not following with a counselor/psychologist.   Over the past year, has had painful tingling on both bottoms of his feet. Pain feels like a bruise. Started and is more severe on the R side. Feels that the whole foot is swollen. 2 d ago, he was walking his dog, turned and felt a pop in his R foot. There was pain & swelling a/w this as well. No bruising, redness. He has been using ice and inserts raising his arches- this does seem to help. Nothing else tried at home. Ambulation is the same as since surgery.   Past Medical History:  Diagnosis Date   Allergy    Anxiety    Arthritis    Blind loop syndrome    Bulging discs    LOWER BACK   Cataract    beginnings of   Diverticulosis    Family history of malignant neoplasm of gastrointestinal tract    History of adenomatous polyp of colon    History of Barrett's esophagus    History of chronic gastritis    Hyperlipidemia    no meds taken   IBS (irritable bowel syndrome)    Internal hemorrhoids    Malignant neoplasm of prostate [facility]) urologist-  dr dahlstedt/  oncologist-  dr patrcia   dx 08-09-2017-- Stage T2a, Gleason 4+5, PSA 3.22 (on finateride)--- treatment ADT and external beam radiation   PONV (postoperative nausea and vomiting)    Seasonal allergic rhinitis    Tubular adenoma of colon    Wears glasses    Exam BP 130/82 (BP Location: Left Arm, Patient Position: Sitting)   Pulse 79   Temp 98 F (36.7 C) (Oral)   Resp 16   Ht 5' 11 (1.803 m)   Wt 180 lb (81.6 kg)   SpO2 96%   BMI 25.10 kg/m  General:  well developed, well nourished, in no apparent distress Heart: DP pulses 1+ b/l Neuro:  Gait nml; sensation decreased to pinprick b/l over toes and forefoot Lungs:  No respiratory distress Psych: well oriented with normal range of affect and age-appropriate judgement/insight, alert and oriented x4.  Assessment and Plan  GAD (generalized anxiety disorder) - Plan: TSH  Neuropathy - Plan: B12, DULoxetine (CYMBALTA) 30 MG capsule, Ambulatory referral to Neurology  Chronic, stable. Change Zoloft  as detailed below. Chronic, uncontrolled. Change Zoloft  to Cymbalta 30 mg/d. Refer Neuro. Ck B12 and TSH.  F/u in 1 mo. The patient voiced understanding and agreement to the plan.  Manuel Mt Torreon, DO 03/06/24 2:16 PM

## 2024-03-06 NOTE — Patient Instructions (Addendum)
 If you do not hear anything about your referral in the next 1-2 weeks, call our office and ask for an update.  Give us  2-3 business days to get the results of your labs back.   Ice/cold pack over area for 10-15 min twice daily.  OK to take Tylenol  1000 mg (2 extra strength tabs) or 975 mg (3 regular strength tabs) every 6 hours as needed.  Let us  know if you need anything.

## 2024-03-07 ENCOUNTER — Ambulatory Visit: Payer: Self-pay | Admitting: Family Medicine

## 2024-03-07 ENCOUNTER — Other Ambulatory Visit: Payer: Self-pay | Admitting: Family Medicine

## 2024-03-07 ENCOUNTER — Other Ambulatory Visit (HOSPITAL_BASED_OUTPATIENT_CLINIC_OR_DEPARTMENT_OTHER): Payer: Self-pay

## 2024-03-07 DIAGNOSIS — H6993 Unspecified Eustachian tube disorder, bilateral: Secondary | ICD-10-CM

## 2024-03-07 DIAGNOSIS — Z8546 Personal history of malignant neoplasm of prostate: Secondary | ICD-10-CM | POA: Diagnosis not present

## 2024-03-07 DIAGNOSIS — R3912 Poor urinary stream: Secondary | ICD-10-CM | POA: Diagnosis not present

## 2024-03-07 LAB — VITAMIN B12: Vitamin B-12: 630 pg/mL (ref 211–911)

## 2024-03-07 LAB — TSH: TSH: 2.7 u[IU]/mL (ref 0.35–5.50)

## 2024-03-07 MED ORDER — AZELASTINE HCL 0.1 % NA SOLN
1.0000 | Freq: Two times a day (BID) | NASAL | 1 refills | Status: AC
  Filled 2024-03-07: qty 60, 90d supply, fill #0

## 2024-03-07 MED ORDER — TADALAFIL 5 MG PO TABS
5.0000 mg | ORAL_TABLET | Freq: Every day | ORAL | 11 refills | Status: AC
  Filled 2024-03-07 – 2024-04-07 (×4): qty 30, 30d supply, fill #0
  Filled 2024-05-09: qty 30, 30d supply, fill #1
  Filled 2024-06-03: qty 30, 30d supply, fill #2

## 2024-03-08 ENCOUNTER — Other Ambulatory Visit (HOSPITAL_BASED_OUTPATIENT_CLINIC_OR_DEPARTMENT_OTHER): Payer: Self-pay

## 2024-03-12 ENCOUNTER — Encounter: Payer: Self-pay | Admitting: Family Medicine

## 2024-03-12 LAB — GENECONNECT MOLECULAR SCREEN: Genetic Analysis Overall Interpretation: NEGATIVE

## 2024-03-13 ENCOUNTER — Other Ambulatory Visit: Payer: Self-pay

## 2024-03-13 DIAGNOSIS — R200 Anesthesia of skin: Secondary | ICD-10-CM

## 2024-03-14 ENCOUNTER — Ambulatory Visit: Admitting: Family Medicine

## 2024-03-20 ENCOUNTER — Encounter: Payer: Self-pay | Admitting: Radiology

## 2024-03-23 ENCOUNTER — Encounter: Payer: Self-pay | Admitting: Podiatry

## 2024-03-23 ENCOUNTER — Ambulatory Visit

## 2024-03-23 ENCOUNTER — Ambulatory Visit: Admitting: Podiatry

## 2024-03-23 DIAGNOSIS — M21621 Bunionette of right foot: Secondary | ICD-10-CM

## 2024-03-23 DIAGNOSIS — Q667 Congenital pes cavus, unspecified foot: Secondary | ICD-10-CM

## 2024-03-23 NOTE — Patient Instructions (Signed)
 Look for urea  40% cream or ointment and apply to the thickened dry skin / calluses. This can be bought over the counter, at a pharmacy or online such as [employer].  You can also scrub the callus with white vinegar which will make it easier to file down with a pumice stone or emory board.  Recommend padding around the callus using felt padding.  You can look for dancers pads online from [employer].  Since we are padding underneath the fifth metatarsal, you will need to find pads designed for the opposite foot.

## 2024-03-23 NOTE — Progress Notes (Unsigned)
 Subjective:  Patient ID: Manuel Landry, male    DOB: 11/23/1945,  MRN: [id number]  Chief Complaint  Patient presents with   Foot Pain    R foot pain (tailors bunion) since October  Not diabetic no anti coag.    Discussed the use of AI scribe software for clinical note transcription with the patient, who gave verbal consent to proceed.  History of Present Illness Manuel Landry is a 78 year old male who presents with right foot pain and swelling after a recent injury.  He experienced a pop in his right foot while walking his dog on October 20th, leading to swelling and pain primarily in the forefoot area, both top and bottom. The pain is exacerbated by pressure and is shoe gear dependent, with relief noted when wearing cushioned slippers or socks.  He has needles and numbness in both feet, initially thought to be neuropathy, which have improved with duloxetine. He reports a tight Achilles tendon, more pronounced on the right side.  He has a history of sesamoid bone removal from his foot. He describes a callus formation underneath the fifth metatarsal head on the right foot, which he manages with pedicures. He has tried using a sleeve for support, although it caused discomfort by cutting off circulation.  He wears cowboy boots, specifically [employer] brand, and uses inserts for his shoes, some with higher arches, to manage his foot condition.      Objective:    Physical Exam EXTREMITIES: DP and PT pulses intact bilaterally. Good muscle strength in feet. Gross light touch sensation intact. MUSCULOSKELETAL: High arch foot type bilaterally. Wide forefoot.  Right foot tenderness on palpation of fifth metatarsal head laterally and plantarly.  Wide forefoot with tailor's bunion deformity noted.  Decreased ankle joint dorsiflexion bilaterally, right greater than left with less than 10 degrees dorsiflexion past neutral. No pain on palpation of the top of the right foot. No swelling  observed on the right foot. Tight Achilles tendon on the right foot. Neurologic: Subjective paresthesias Dermatologic: Hyperkeratotic tissue present subfifth metatarsal head right foot   No images are attached to the encounter.    Results RADIOLOGY Foot X-ray: High cavus foot type, no acute fractures, prominent fifth metatarsal head, high calcaneal inclination, slight met adductus noted.  Mild bunion deformity.  Tailor's bunion present.  History of fibular sesamoid excision noted.  (03/23/2024)    Assessment:   1. Tailor's bunion of right foot   2. Cavus deformity of foot      Plan:  Patient was evaluated and treated and all questions answered.  Assessment and Plan Assessment & Plan Bunionette (Tailor's bunion) of right foot with associated callus formation Bunionette with prominent fifth metatarsal head and callus formation. Pain linked to shoe pressure. High arches increase metatarsal pressure. - Shaved callus on right foot as a courtesy using #15 blade without incident. - Recommended dancer's pad to offload pressure from the fifth metatarsal head. - Advised on home maintenance techniques including urea -based cream, white vinegar scrub, and pumice stone for callus management. - Encouraged regular pedicures to manage callus formation. - Discussed potential for custom inserts with built-in accommodation if current inserts are ineffective.  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 X-rays confirm high arches with well-aligned bones. - Recommended shoe modifications and inserts to accommodate high arches. - Will evaluate current inserts and consider custom inserts if necessary.  Decreased ankle dorsiflexion,  bilateral (right greater than left) Decreased ankle dorsiflexion bilaterally, more pronounced on the right, increases forefoot pressure. - Continue to monitor ankle  dorsiflexion and consider interventions if symptoms worsen.      Return if symptoms worsen or fail to improve, for cavus foot/tailors bunion.

## 2024-03-26 ENCOUNTER — Encounter: Payer: Self-pay | Admitting: Podiatry

## 2024-03-28 ENCOUNTER — Other Ambulatory Visit (HOSPITAL_BASED_OUTPATIENT_CLINIC_OR_DEPARTMENT_OTHER): Payer: Self-pay

## 2024-04-07 ENCOUNTER — Other Ambulatory Visit (HOSPITAL_BASED_OUTPATIENT_CLINIC_OR_DEPARTMENT_OTHER): Payer: Self-pay

## 2024-04-07 ENCOUNTER — Encounter: Payer: Self-pay | Admitting: Family Medicine

## 2024-04-07 ENCOUNTER — Ambulatory Visit: Admitting: Family Medicine

## 2024-04-07 VITALS — BP 122/78 | HR 100 | Temp 98.3°F | Resp 16 | Ht 71.0 in | Wt 184.0 lb

## 2024-04-07 DIAGNOSIS — G629 Polyneuropathy, unspecified: Secondary | ICD-10-CM | POA: Diagnosis not present

## 2024-04-07 DIAGNOSIS — F411 Generalized anxiety disorder: Secondary | ICD-10-CM | POA: Diagnosis not present

## 2024-04-07 MED ORDER — VENLAFAXINE HCL ER 37.5 MG PO CP24
37.5000 mg | ORAL_CAPSULE | Freq: Every day | ORAL | 1 refills | Status: DC
  Filled 2024-04-07: qty 30, 30d supply, fill #0
  Filled 2024-05-01 – 2024-05-03 (×2): qty 30, 30d supply, fill #1

## 2024-04-07 NOTE — Progress Notes (Signed)
 Chief Complaint  Patient presents with   Medication Refill    Subjective Manuel Landry presents for f/u anxiety/neuropathy.  Pt is currently being treated with Cymbalta  30 mg/d.  Reports doing OK since treatment. No thoughts of harming self or others. No self-medication with alcohol , prescription drugs or illicit drugs. Pt is not following with a counselor/psychologist.  Neuropathy improved initially on the Cymbalta . He stopped 1 week ago. Associated HA since he stopped. Has appt w Neuro in March.   Past Medical History:  Diagnosis Date   Allergy    Anxiety    Arthritis    Blind loop syndrome    Bulging discs    LOWER BACK   Cataract    beginnings of   Diverticulosis    Family history of malignant neoplasm of gastrointestinal tract    History of adenomatous polyp of colon    History of Barrett's esophagus    History of chronic gastritis    Hyperlipidemia    no meds taken   IBS (irritable bowel syndrome)    Internal hemorrhoids    Malignant neoplasm of prostate [facility]) urologist-  dr dahlstedt/  oncologist-  dr patrcia   dx 08-09-2017-- Stage T2a, Gleason 4+5, PSA 3.22 (on finateride)--- treatment ADT and external beam radiation   PONV (postoperative nausea and vomiting)    Seasonal allergic rhinitis    Tubular adenoma of colon    Wears glasses    Allergies as of 04/07/2024       Reactions   Gabapentin     Caused drop in blood pressure and loss of consciousness        Medication List        Accurate as of April 07, 2024 10:16 AM. If you have any questions, ask your nurse or doctor.          STOP taking these medications    DULoxetine  30 MG capsule Commonly known as: Cymbalta  Stopped by: Manuel Landry       TAKE these medications    amoxicillin  500 MG capsule Commonly known as: AMOXIL  Take 4 pills one hour prior to dental work   Azelastine  HCl 137 MCG/SPRAY Soln Place 1 spray into both nostrils 2 (two) times daily. Use in each  nostril as directed   diphenhydrAMINE  25 MG tablet Commonly known as: BENADRYL  Take 25 mg by mouth at bedtime as needed for allergies.   docusate sodium  100 MG capsule Commonly known as: Colace Take 1 capsule (100 mg total) by mouth daily as needed.   Florastor 250 MG capsule Generic drug: saccharomyces boulardii Take 250 mg by mouth in the morning.   fluticasone  50 MCG/ACT nasal spray Commonly known as: FLONASE  SPRAY 2 SPRAYS INTO EACH NOSTRIL EVERY DAY What changed: See the new instructions.   MAGNESIUM  PO Take 1 capsule by mouth in the morning.   NEURIVA PO Take 1 capsule by mouth in the morning.   Ocuvite Adult 50+ Caps Take 1 capsule by mouth in the morning.   omeprazole  20 MG capsule Commonly known as: PRILOSEC Take 20 mg by mouth every other day. Takes Mon,Wed, Fri   tadalafil  5 MG tablet Commonly known as: CIALIS  Take 1 tablet (5 mg total) by mouth daily.   venlafaxine  XR 37.5 MG 24 hr capsule Commonly known as: Effexor  XR Take 1 capsule (37.5 mg total) by mouth daily with breakfast. Started by: Manuel Landry        Exam BP 122/78 (BP Location: Left Arm, Patient Position: Sitting)  Pulse 100   Temp 98.3 F (36.8 C) (Oral)   Resp 16   Ht 5' 11 (1.803 m)   Wt 184 lb (83.5 kg)   SpO2 95%   BMI 25.66 kg/m  General:  well developed, well nourished, in no apparent distress Lungs:  No respiratory distress Psych: well oriented with normal range of affect and age-appropriate judgement/insight, alert and oriented x4.  Assessment and Plan  GAD (generalized anxiety disorder) - Plan: venlafaxine  XR (EFFEXOR  XR) 37.5 MG 24 hr capsule  Neuropathy - Plan: venlafaxine  XR (EFFEXOR  XR) 37.5 MG 24 hr capsule  Stable. Chronic, unstable. Failed Cymbalta . Start Effexor  XR 37.5 mg/d. Appreciate Neuro input, but cannot get in until March. F/u in 1 mo. The patient voiced understanding and agreement to the plan.  Manuel Mt Pateros,  DO 04/07/24 10:16 AM

## 2024-04-07 NOTE — Patient Instructions (Signed)
 Let me know if there are issues.  The headache is likely due to coming off of the Cymbalta .   Let us  know if you need anything.

## 2024-04-11 ENCOUNTER — Other Ambulatory Visit (HOSPITAL_BASED_OUTPATIENT_CLINIC_OR_DEPARTMENT_OTHER): Payer: Self-pay

## 2024-05-01 ENCOUNTER — Other Ambulatory Visit (HOSPITAL_BASED_OUTPATIENT_CLINIC_OR_DEPARTMENT_OTHER): Payer: Self-pay

## 2024-05-09 ENCOUNTER — Other Ambulatory Visit (HOSPITAL_BASED_OUTPATIENT_CLINIC_OR_DEPARTMENT_OTHER): Payer: Self-pay

## 2024-05-30 ENCOUNTER — Other Ambulatory Visit: Payer: Self-pay | Admitting: Family Medicine

## 2024-05-30 ENCOUNTER — Encounter: Payer: Self-pay | Admitting: Family Medicine

## 2024-05-30 ENCOUNTER — Other Ambulatory Visit (HOSPITAL_BASED_OUTPATIENT_CLINIC_OR_DEPARTMENT_OTHER): Payer: Self-pay

## 2024-05-30 DIAGNOSIS — G629 Polyneuropathy, unspecified: Secondary | ICD-10-CM

## 2024-05-30 DIAGNOSIS — F411 Generalized anxiety disorder: Secondary | ICD-10-CM

## 2024-05-30 MED ORDER — VENLAFAXINE HCL ER 37.5 MG PO CP24
37.5000 mg | ORAL_CAPSULE | Freq: Every day | ORAL | 1 refills | Status: DC
  Filled 2024-05-30: qty 30, 30d supply, fill #0

## 2024-05-30 MED ORDER — DULOXETINE HCL 30 MG PO CPEP
30.0000 mg | ORAL_CAPSULE | Freq: Every day | ORAL | 3 refills | Status: AC
  Filled 2024-05-30: qty 30, 30d supply, fill #0
  Filled 2024-06-23: qty 30, 30d supply, fill #1

## 2024-05-30 NOTE — Telephone Encounter (Signed)
 Pended Cymbalta , sign if appropriate to switch back.

## 2024-05-31 ENCOUNTER — Other Ambulatory Visit (INDEPENDENT_AMBULATORY_CARE_PROVIDER_SITE_OTHER)

## 2024-05-31 ENCOUNTER — Ambulatory Visit (INDEPENDENT_AMBULATORY_CARE_PROVIDER_SITE_OTHER): Admitting: Orthopaedic Surgery

## 2024-05-31 DIAGNOSIS — Z96642 Presence of left artificial hip joint: Secondary | ICD-10-CM

## 2024-05-31 NOTE — Progress Notes (Signed)
 "  Office Visit Note   Patient: Manuel Landry           Date of Birth: 1946-02-05           MRN: [id number] Visit Date: 05/31/2024              Requested by: Frann Mabel Mt, DO [address] [place],  [place] [address] PCP: Frann Mabel Mt, DO   Assessment & Plan: Visit Diagnoses:  1. Status post total replacement of left hip     Plan: History of Present Illness Manuel Landry is a 79 year old male six months status post left hip arthroplasty who presents for routine postoperative follow-up.  He has intermittent tenderness and numbness at the left hip scar, mainly with direct pressure. Symptoms are mild, not constant, and do not interrupt daily activities.  He ambulates independently, can run, and plays actively with his dog for exercise. He can put on socks without difficulty and is satisfied with the hip function.   Left hip scar is fully healed.  Fluid painless ROM.  Normal gait pattern.  Assessment and Plan Status post left total hip arthroplasty Six months post-surgery. Highly functional and satisfied with the outcome. - Follow-up in six months for one-year postoperative evaluation and repeat radiographs. - Reassured that mild tenderness and numbness are normal and should improve. - Advised to contact the office with questions or concerns.  Follow-Up Instructions: Return in about 6 months (around 11/28/2024).   Orders:  Orders Placed This Encounter  Procedures   XR Pelvis 1-2 Views   No orders of the defined types were placed in this encounter.     Procedures: No procedures performed   Clinical Data: No additional findings.   Subjective: Chief Complaint  Patient presents with   Left Hip - Follow-up    HPI  Review of Systems  Constitutional: Negative.   HENT: Negative.    Eyes: Negative.   Respiratory: Negative.    Cardiovascular: Negative.   Gastrointestinal: Negative.   Endocrine: Negative.    Genitourinary: Negative.   Skin: Negative.   Allergic/Immunologic: Negative.   Neurological: Negative.   Hematological: Negative.   Psychiatric/Behavioral: Negative.    All other systems reviewed and are negative.    Objective: Vital Signs: There were no vitals taken for this visit.  Physical Exam Vitals and nursing note reviewed.  Constitutional:      Appearance: He is well-developed.  HENT:     Head: Normocephalic and atraumatic.  Eyes:     Pupils: Pupils are equal, round, and reactive to light.  Pulmonary:     Effort: Pulmonary effort is normal.  Abdominal:     Palpations: Abdomen is soft.  Musculoskeletal:        General: Normal range of motion.     Cervical back: Neck supple.  Skin:    General: Skin is warm.  Neurological:     Mental Status: He is alert and oriented to person, place, and time.  Psychiatric:        Behavior: Behavior normal.        Thought Content: Thought content normal.        Judgment: Judgment normal.     Ortho Exam  Specialty Comments:  No specialty comments available.  Imaging: XR Pelvis 1-2 Views Result Date: 05/31/2024 Stable left total hip replacement without complications.  Arthritic changes of the right hip.    PMFS History: Patient Active Problem List   Diagnosis  Date Noted   Neuropathy 03/06/2024   Status post total replacement of left hip 11/25/2023   Primary osteoarthritis of left hip 11/24/2023   Elevated blood pressure reading 06/11/2022   Seasonal allergies 03/03/2022   Chronic rhinitis 03/06/2021   Nocturnal muscle cramp 09/02/2020   GAD (generalized anxiety disorder) 09/02/2020   Closed nondisplaced fracture of body of left scapula 04/15/2020   Situational anxiety 12/27/2018   Trigger finger of left thumb 11/17/2018   Hyperlipidemia 01/27/2017   Cardiovascular risk factor 08/12/2011   S/P cholecystectomy 06/22/2011   History of colonic polyps 06/22/2011   FLATULENCE-GAS-BLOATING 02/13/2010   Calculus of  gallbladder 02/11/2010   GERD 02/10/2010   Diverticulosis of colon 02/10/2010   Nausea 02/10/2010   ABDOMINAL PAIN RIGHT UPPER QUADRANT 02/10/2010   ABDOMINAL PAIN, EPIGASTRIC 02/10/2010   ANEMIA-IRON DEFICIENCY 07/17/2008   Inguinal hernia 07/17/2008   IRRITABLE BOWEL SYNDROME 07/17/2008   Gastritis and gastroduodenitis 07/11/2008   Duodenitis 07/11/2008   RENAL CYST, RIGHT 07/11/2008   DYSPEPSIA 06/12/2008   Constipation 05/31/2008   Backache 05/31/2008   BENIGN PROSTATIC HYPERTROPHY, MILD, HX OF 05/31/2008   Past Medical History:  Diagnosis Date   Allergy    Anxiety    Arthritis    Blind loop syndrome    Bulging discs    LOWER BACK   Cataract    beginnings of   Diverticulosis    Family history of malignant neoplasm of gastrointestinal tract    History of adenomatous polyp of colon    History of Barrett's esophagus    History of chronic gastritis    Hyperlipidemia    no meds taken   IBS (irritable bowel syndrome)    Internal hemorrhoids    Malignant neoplasm of prostate [facility]) urologist-  dr dahlstedt/  oncologist-  dr patrcia   dx 08-09-2017-- Stage T2a, Gleason 4+5, PSA 3.22 (on finateride)--- treatment ADT and external beam radiation   PONV (postoperative nausea and vomiting)    Seasonal allergic rhinitis    Tubular adenoma of colon    Wears glasses     Family History  Problem Relation Age of Onset   Colon cancer Mother 76   Cancer Mother    Diabetes Maternal Grandmother    COPD Sister    Heart failure Father 27   Esophageal cancer Neg Hx    Rectal cancer Neg Hx    Stomach cancer Neg Hx     Past Surgical History:  Procedure Laterality Date   bone spur Right    right foot   CATARACT EXTRACTION Bilateral 2024   COLONOSCOPY  last one 10-21-2016  dr albertus   CYSTOSCOPY WITH INSERTION OF UROLIFT N/A 12/15/2017   Procedure: CYSTOSCOPY WITH INSERTION OF UROLIFT, BIOPSY  PROSTATIC URETHAL;  Surgeon: Matilda Senior, MD;  Location: [facility];  Service: Urology;  Laterality: N/A;   ESOPHAGOGASTRODUODENOSCOPY (EGD) WITH PROPOFOL   last one 09-26-2013   dr pyrtle   INGUINAL HERNIA REPAIR Bilateral right 10-25-1997:  left 09-28-2008 (dr m. gladis @ [facility])   LAPAROSCOPIC CHOLECYSTECTOMY  2012   MANDIBLE SURGERY Bilateral 1990s   removal calcified bone growths   ROTATOR CUFF REPAIR Right 03-26-2009   dr duwayne  [facility]   SHOULDER OPEN ROTATOR CUFF REPAIR  03/17/2012   Procedure: ROTATOR CUFF REPAIR SHOULDER OPEN;  Surgeon: Reyes JAYSON Duwayne, MD;  Location: [facility];  Service: Orthopedics;  Laterality: Left;  LEFT MINI OPEN ROTATOR CUFF REPAIR    SPACE OAR INSTILLATION N/A 12/15/2017  Procedure: SPACE OAR INSTILLATION;  Surgeon: Matilda Senior, MD;  Location: [facility];  Service: Urology;  Laterality: N/A;   TONSILLECTOMY  child   TOTAL HIP ARTHROPLASTY Left 11/25/2023   Procedure: LEFT TOTAL HIP ARTHROPLASTY ANTERIOR APPROACH;  Surgeon: Jerri Kay HERO, MD;  Location: [facility];  Service: Orthopedics;  Laterality: Left;  3-C   UPPER GASTROINTESTINAL ENDOSCOPY     VASECTOMY     Social History   Occupational History   Occupation: Retired    Associate Professor: RETIRED  Tobacco Use   Smoking status: Never   Smokeless tobacco: Never   Tobacco comments:    Never smoke 11/13/21  Vaping Use   Vaping status: Never Used  Substance and Sexual Activity   Alcohol  use: Yes    Alcohol /week: 2.0 standard drinks of alcohol     Types: 2 Glasses of wine per week    Comment: occasional   Drug use: Never   Sexual activity: Not Currently    Birth control/protection: None    Comment: vasectomy 2011        "

## 2024-06-05 ENCOUNTER — Other Ambulatory Visit (HOSPITAL_BASED_OUTPATIENT_CLINIC_OR_DEPARTMENT_OTHER): Payer: Self-pay

## 2024-06-23 ENCOUNTER — Other Ambulatory Visit (HOSPITAL_BASED_OUTPATIENT_CLINIC_OR_DEPARTMENT_OTHER): Payer: Self-pay

## 2024-08-08 ENCOUNTER — Ambulatory Visit: Payer: Self-pay | Admitting: Diagnostic Neuroimaging

## 2024-11-28 ENCOUNTER — Ambulatory Visit: Admitting: Orthopaedic Surgery

## 2025-02-16 ENCOUNTER — Ambulatory Visit

## 2025-02-28 ENCOUNTER — Other Ambulatory Visit
